# Patient Record
Sex: Male | Born: 1940 | Race: White | Hispanic: No | Marital: Married | Smoking: Former smoker
Health system: Southern US, Community
[De-identification: ages and names within clinical notes are randomized; demographics above are authoritative.]

## PROBLEM LIST (undated history)

## (undated) DIAGNOSIS — R351 Nocturia: Secondary | ICD-10-CM

## (undated) DIAGNOSIS — G8929 Other chronic pain: Secondary | ICD-10-CM

## (undated) DIAGNOSIS — G4733 Obstructive sleep apnea (adult) (pediatric): Secondary | ICD-10-CM

## (undated) DIAGNOSIS — M549 Dorsalgia, unspecified: Secondary | ICD-10-CM

## (undated) DIAGNOSIS — M1990 Unspecified osteoarthritis, unspecified site: Secondary | ICD-10-CM

## (undated) DIAGNOSIS — G4730 Sleep apnea, unspecified: Secondary | ICD-10-CM

## (undated) DIAGNOSIS — Z87898 Personal history of other specified conditions: Secondary | ICD-10-CM

## (undated) DIAGNOSIS — I2510 Atherosclerotic heart disease of native coronary artery without angina pectoris: Secondary | ICD-10-CM

## (undated) DIAGNOSIS — E7800 Pure hypercholesterolemia, unspecified: Secondary | ICD-10-CM

## (undated) DIAGNOSIS — Z8719 Personal history of other diseases of the digestive system: Secondary | ICD-10-CM

## (undated) DIAGNOSIS — H9190 Unspecified hearing loss, unspecified ear: Secondary | ICD-10-CM

## (undated) DIAGNOSIS — Z87440 Personal history of urinary (tract) infections: Secondary | ICD-10-CM

## (undated) DIAGNOSIS — Z8711 Personal history of peptic ulcer disease: Secondary | ICD-10-CM

## (undated) DIAGNOSIS — I499 Cardiac arrhythmia, unspecified: Secondary | ICD-10-CM

## (undated) HISTORY — PX: JOINT REPLACEMENT: SHX530

## (undated) HISTORY — PX: TONSILLECTOMY: SUR1361

## (undated) HISTORY — PX: CARDIAC CATHETERIZATION: SHX172

## (undated) HISTORY — PX: BACK SURGERY: SHX140

## (undated) HISTORY — PX: OTHER SURGICAL HISTORY: SHX169

## (undated) HISTORY — PX: ESOPHAGOGASTRODUODENOSCOPY: SHX1529

## (undated) HISTORY — DX: Atherosclerotic heart disease of native coronary artery without angina pectoris: I25.10

---

## 2000-09-16 ENCOUNTER — Encounter: Admission: RE | Admit: 2000-09-16 | Discharge: 2000-09-16 | Payer: Self-pay | Admitting: Orthopedic Surgery

## 2000-09-16 ENCOUNTER — Encounter: Payer: Self-pay | Admitting: Orthopedic Surgery

## 2004-09-02 ENCOUNTER — Encounter: Admission: RE | Admit: 2004-09-02 | Discharge: 2004-09-02 | Payer: Self-pay | Admitting: Internal Medicine

## 2004-10-06 ENCOUNTER — Encounter: Admission: RE | Admit: 2004-10-06 | Discharge: 2004-10-06 | Payer: Self-pay | Admitting: Internal Medicine

## 2004-10-23 ENCOUNTER — Encounter: Admission: RE | Admit: 2004-10-23 | Discharge: 2004-10-23 | Payer: Self-pay | Admitting: Internal Medicine

## 2004-11-10 ENCOUNTER — Encounter: Admission: RE | Admit: 2004-11-10 | Discharge: 2004-11-10 | Payer: Self-pay | Admitting: Internal Medicine

## 2004-12-01 ENCOUNTER — Encounter: Admission: RE | Admit: 2004-12-01 | Discharge: 2004-12-01 | Payer: Self-pay | Admitting: Internal Medicine

## 2005-01-06 ENCOUNTER — Encounter: Admission: RE | Admit: 2005-01-06 | Discharge: 2005-03-11 | Payer: Self-pay | Admitting: Orthopedic Surgery

## 2005-03-09 ENCOUNTER — Ambulatory Visit (HOSPITAL_COMMUNITY): Admission: RE | Admit: 2005-03-09 | Discharge: 2005-03-09 | Payer: Self-pay | Admitting: Internal Medicine

## 2005-07-27 ENCOUNTER — Encounter: Admission: RE | Admit: 2005-07-27 | Discharge: 2005-07-27 | Payer: Self-pay | Admitting: Internal Medicine

## 2005-08-12 ENCOUNTER — Encounter: Admission: RE | Admit: 2005-08-12 | Discharge: 2005-08-12 | Payer: Self-pay | Admitting: Internal Medicine

## 2005-11-20 ENCOUNTER — Encounter: Admission: RE | Admit: 2005-11-20 | Discharge: 2005-11-20 | Payer: Self-pay | Admitting: Internal Medicine

## 2006-01-26 ENCOUNTER — Encounter: Admission: RE | Admit: 2006-01-26 | Discharge: 2006-01-26 | Payer: Self-pay | Admitting: Internal Medicine

## 2006-04-02 ENCOUNTER — Encounter: Admission: RE | Admit: 2006-04-02 | Discharge: 2006-04-02 | Payer: Self-pay | Admitting: Internal Medicine

## 2006-06-15 ENCOUNTER — Encounter: Admission: RE | Admit: 2006-06-15 | Discharge: 2006-06-15 | Payer: Self-pay | Admitting: Internal Medicine

## 2006-07-02 ENCOUNTER — Encounter: Admission: RE | Admit: 2006-07-02 | Discharge: 2006-07-02 | Payer: Self-pay | Admitting: Internal Medicine

## 2006-10-07 ENCOUNTER — Encounter: Admission: RE | Admit: 2006-10-07 | Discharge: 2006-10-07 | Payer: Self-pay | Admitting: Internal Medicine

## 2006-12-22 ENCOUNTER — Encounter: Admission: RE | Admit: 2006-12-22 | Discharge: 2006-12-22 | Payer: Self-pay | Admitting: Internal Medicine

## 2007-01-27 ENCOUNTER — Encounter: Admission: RE | Admit: 2007-01-27 | Discharge: 2007-01-27 | Payer: Self-pay | Admitting: Internal Medicine

## 2007-02-10 ENCOUNTER — Ambulatory Visit: Payer: Self-pay | Admitting: Cardiology

## 2007-04-18 ENCOUNTER — Encounter: Admission: RE | Admit: 2007-04-18 | Discharge: 2007-05-04 | Payer: Self-pay | Admitting: Orthopedic Surgery

## 2007-05-05 ENCOUNTER — Encounter: Admission: RE | Admit: 2007-05-05 | Discharge: 2007-06-17 | Payer: Self-pay | Admitting: Orthopedic Surgery

## 2007-05-18 ENCOUNTER — Encounter: Admission: RE | Admit: 2007-05-18 | Discharge: 2007-05-18 | Payer: Self-pay | Admitting: Internal Medicine

## 2007-07-26 ENCOUNTER — Encounter: Admission: RE | Admit: 2007-07-26 | Discharge: 2007-07-26 | Payer: Self-pay | Admitting: Internal Medicine

## 2007-09-10 ENCOUNTER — Encounter: Admission: RE | Admit: 2007-09-10 | Discharge: 2007-09-10 | Payer: Self-pay | Admitting: Neurological Surgery

## 2007-09-29 ENCOUNTER — Encounter: Admission: RE | Admit: 2007-09-29 | Discharge: 2007-09-29 | Payer: Self-pay | Admitting: Neurological Surgery

## 2007-11-03 ENCOUNTER — Ambulatory Visit (HOSPITAL_COMMUNITY): Admission: RE | Admit: 2007-11-03 | Discharge: 2007-11-03 | Payer: Self-pay | Admitting: Neurological Surgery

## 2008-01-27 ENCOUNTER — Encounter: Admission: RE | Admit: 2008-01-27 | Discharge: 2008-01-27 | Payer: Self-pay | Admitting: Neurological Surgery

## 2008-07-02 ENCOUNTER — Encounter: Admission: RE | Admit: 2008-07-02 | Discharge: 2008-07-02 | Payer: Self-pay | Admitting: Orthopedic Surgery

## 2009-04-12 DIAGNOSIS — E785 Hyperlipidemia, unspecified: Secondary | ICD-10-CM | POA: Diagnosis present

## 2009-04-12 DIAGNOSIS — K219 Gastro-esophageal reflux disease without esophagitis: Secondary | ICD-10-CM | POA: Diagnosis present

## 2009-05-15 ENCOUNTER — Encounter: Admission: RE | Admit: 2009-05-15 | Discharge: 2009-05-15 | Payer: Self-pay | Admitting: Internal Medicine

## 2009-12-30 ENCOUNTER — Encounter: Admission: RE | Admit: 2009-12-30 | Discharge: 2010-03-30 | Payer: Self-pay | Admitting: Rheumatology

## 2010-03-25 ENCOUNTER — Inpatient Hospital Stay (HOSPITAL_COMMUNITY): Admission: EM | Admit: 2010-03-25 | Discharge: 2010-03-26 | Payer: Self-pay | Admitting: Emergency Medicine

## 2010-03-26 ENCOUNTER — Encounter (INDEPENDENT_AMBULATORY_CARE_PROVIDER_SITE_OTHER): Payer: Self-pay | Admitting: Cardiology

## 2010-05-01 ENCOUNTER — Ambulatory Visit (HOSPITAL_COMMUNITY)
Admission: RE | Admit: 2010-05-01 | Discharge: 2010-05-01 | Payer: Self-pay | Source: Home / Self Care | Attending: Cardiology | Admitting: Cardiology

## 2010-07-14 LAB — CBC
HCT: 42 % (ref 39.0–52.0)
MCH: 33.3 pg (ref 26.0–34.0)
MCV: 97 fL (ref 78.0–100.0)
Platelets: 261 10*3/uL (ref 150–400)
RBC: 4.33 MIL/uL (ref 4.22–5.81)
RDW: 14 % (ref 11.5–15.5)

## 2010-07-14 LAB — BASIC METABOLIC PANEL
BUN: 20 mg/dL (ref 6–23)
Calcium: 9 mg/dL (ref 8.4–10.5)
Creatinine, Ser: 1.19 mg/dL (ref 0.4–1.5)

## 2010-07-15 LAB — COMPREHENSIVE METABOLIC PANEL
ALT: 32 U/L (ref 0–53)
AST: 20 U/L (ref 0–37)
CO2: 22 mEq/L (ref 19–32)
Chloride: 107 mEq/L (ref 96–112)
GFR calc Af Amer: 60 mL/min — ABNORMAL LOW (ref >60)
GFR calc non Af Amer: 49 mL/min — ABNORMAL LOW (ref >60)
Sodium: 138 mEq/L (ref 135–145)
Total Bilirubin: 0.7 mg/dL (ref 0.3–1.2)

## 2010-07-15 LAB — CBC
HCT: 42.3 % (ref 39.0–52.0)
Hemoglobin: 15.9 g/dL (ref 13.0–17.0)
MCH: 34.4 pg — ABNORMAL HIGH (ref 26.0–34.0)
MCHC: 35.3 g/dL (ref 30.0–36.0)
MCV: 97.4 fL (ref 78.0–100.0)
MCV: 98.6 fL (ref 78.0–100.0)
Platelets: 300 10*3/uL (ref 150–400)
RBC: 4.62 MIL/uL (ref 4.22–5.81)
RDW: 13.9 % (ref 11.5–15.5)
WBC: 9.6 10*3/uL (ref 4.0–10.5)

## 2010-07-15 LAB — DIFFERENTIAL
Basophils Absolute: 0 10*3/uL (ref 0.0–0.1)
Basophils Relative: 0 % (ref 0–1)
Eosinophils Absolute: 0.2 10*3/uL (ref 0.0–0.7)
Eosinophils Relative: 2 % (ref 0–5)

## 2010-07-15 LAB — LIPID PANEL
HDL: 47 mg/dL (ref >39)
Triglycerides: 127 mg/dL (ref <150)
VLDL: 25 mg/dL (ref 0–40)

## 2010-07-15 LAB — CARDIAC PANEL(CRET KIN+CKTOT+MB+TROPI)
Relative Index: 2.4 (ref 0.0–2.5)
Troponin I: 0.06 ng/mL (ref 0.00–0.06)

## 2010-07-15 LAB — TROPONIN I: Troponin I: 0.01 ng/mL (ref 0.00–0.06)

## 2010-08-11 ENCOUNTER — Ambulatory Visit: Payer: Self-pay | Admitting: Physical Therapy

## 2010-08-14 ENCOUNTER — Ambulatory Visit: Payer: Medicare Other | Attending: Pain Medicine | Admitting: Physical Therapy

## 2010-08-14 DIAGNOSIS — R5381 Other malaise: Secondary | ICD-10-CM | POA: Insufficient documentation

## 2010-08-14 DIAGNOSIS — M545 Low back pain, unspecified: Secondary | ICD-10-CM | POA: Insufficient documentation

## 2010-08-14 DIAGNOSIS — IMO0001 Reserved for inherently not codable concepts without codable children: Secondary | ICD-10-CM | POA: Insufficient documentation

## 2010-08-15 ENCOUNTER — Ambulatory Visit: Payer: Medicare Other | Admitting: Physical Therapy

## 2010-08-19 ENCOUNTER — Ambulatory Visit: Payer: Medicare Other | Admitting: Physical Therapy

## 2010-08-21 ENCOUNTER — Ambulatory Visit: Payer: Medicare Other | Admitting: Physical Therapy

## 2010-09-02 ENCOUNTER — Encounter (HOSPITAL_COMMUNITY)
Admission: RE | Admit: 2010-09-02 | Discharge: 2010-09-02 | Disposition: A | Discharge status: Home / Self Care | Payer: Medicare Other | Source: Ambulatory Visit | Attending: Orthopedic Surgery | Admitting: Orthopedic Surgery

## 2010-09-02 ENCOUNTER — Other Ambulatory Visit (HOSPITAL_COMMUNITY): Payer: Self-pay | Admitting: Orthopedic Surgery

## 2010-09-02 ENCOUNTER — Ambulatory Visit (HOSPITAL_COMMUNITY)
Admission: RE | Admit: 2010-09-02 | Discharge: 2010-09-02 | Disposition: A | Discharge status: Home / Self Care | Payer: Medicare Other | Source: Ambulatory Visit | Attending: Orthopedic Surgery | Admitting: Orthopedic Surgery

## 2010-09-02 DIAGNOSIS — I10 Essential (primary) hypertension: Secondary | ICD-10-CM | POA: Insufficient documentation

## 2010-09-02 DIAGNOSIS — M19012 Primary osteoarthritis, left shoulder: Secondary | ICD-10-CM

## 2010-09-02 DIAGNOSIS — M19019 Primary osteoarthritis, unspecified shoulder: Secondary | ICD-10-CM | POA: Insufficient documentation

## 2010-09-02 DIAGNOSIS — Z01810 Encounter for preprocedural cardiovascular examination: Secondary | ICD-10-CM | POA: Insufficient documentation

## 2010-09-02 DIAGNOSIS — Z01812 Encounter for preprocedural laboratory examination: Secondary | ICD-10-CM | POA: Insufficient documentation

## 2010-09-02 DIAGNOSIS — Z01811 Encounter for preprocedural respiratory examination: Secondary | ICD-10-CM | POA: Insufficient documentation

## 2010-09-02 LAB — URINALYSIS, ROUTINE W REFLEX MICROSCOPIC
Bilirubin Urine: NEGATIVE
Glucose, UA: NEGATIVE mg/dL
Hgb urine dipstick: NEGATIVE
Ketones, ur: NEGATIVE mg/dL
Nitrite: NEGATIVE
pH: 6 (ref 5.0–8.0)

## 2010-09-02 LAB — CBC
MCH: 32.7 pg (ref 26.0–34.0)
MCV: 93.7 fL (ref 78.0–100.0)
Platelets: 271 10*3/uL (ref 150–400)
RBC: 4.31 MIL/uL (ref 4.22–5.81)

## 2010-09-02 LAB — COMPREHENSIVE METABOLIC PANEL
ALT: 49 U/L (ref 0–53)
AST: 34 U/L (ref 0–37)
Albumin: 3.8 g/dL (ref 3.5–5.2)
Alkaline Phosphatase: 56 U/L (ref 39–117)
Chloride: 102 mEq/L (ref 96–112)
GFR calc Af Amer: 60 mL/min — ABNORMAL LOW (ref >60)
Potassium: 5 mEq/L (ref 3.5–5.1)
Sodium: 139 mEq/L (ref 135–145)
Total Bilirubin: 0.4 mg/dL (ref 0.3–1.2)

## 2010-09-02 LAB — ABO/RH: ABO/RH(D): B POS

## 2010-09-02 LAB — TYPE AND SCREEN

## 2010-09-02 LAB — APTT: aPTT: 37 seconds (ref 24–37)

## 2010-09-04 ENCOUNTER — Inpatient Hospital Stay (HOSPITAL_COMMUNITY)
Admission: RE | Admit: 2010-09-04 | Discharge: 2010-09-05 | DRG: 484 | Disposition: A | Discharge status: Home / Self Care | Payer: Medicare Other | Source: Ambulatory Visit | Attending: Orthopedic Surgery | Admitting: Orthopedic Surgery

## 2010-09-04 DIAGNOSIS — I10 Essential (primary) hypertension: Secondary | ICD-10-CM | POA: Diagnosis present

## 2010-09-04 DIAGNOSIS — G4733 Obstructive sleep apnea (adult) (pediatric): Secondary | ICD-10-CM | POA: Diagnosis present

## 2010-09-04 DIAGNOSIS — E669 Obesity, unspecified: Secondary | ICD-10-CM | POA: Diagnosis present

## 2010-09-04 DIAGNOSIS — M19019 Primary osteoarthritis, unspecified shoulder: Principal | ICD-10-CM | POA: Diagnosis present

## 2010-09-04 DIAGNOSIS — I4891 Unspecified atrial fibrillation: Secondary | ICD-10-CM | POA: Diagnosis present

## 2010-09-16 NOTE — Op Note (Signed)
NAMEELHADJI, PECORE NO.:  [id number]   MEDICAL RECORD NO.:  [id number]          PATIENT TYPE:  INP   LOCATION:  3536                         FACILITY:  [facility]   PHYSICIAN:  Stefani Dama, M.D.  DATE OF BIRTH:  [birth year]   DATE OF PROCEDURE:  11/03/2007  DATE OF DISCHARGE:  11/03/2007                               OPERATIVE REPORT   PREOPERATIVE DIAGNOSES:  1. Lumbar spondylosis.  2. 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   POSTOPERATIVE DIAGNOSIS:  1. Lumbar spondylosis.  2. 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   PROCEDURE:  Placement of X-Stop interspinous distraction at L4-L5.   SURGEON:  Stefani Dama, MD   FIRST ASSISTANT:  Aura Fey. Dennison Bulla, [place]   INDICATIONS:  Mcgregor Tinnon is a 70 year old individual that had  evidence of subarticular stenosis at L4-L5 causing neurogenic  claudication syndrome with an L5 type distribution.  The patient has  been tried on conservative measures including some epidural steroid  injections which have had limited benefit used this advice regarding  consideration of X-Stop placement and is now taken to the operating room  for this procedure.  His MRI demonstrates that he has some articular  stenosis with grade 1 anterolisthesis at L4-L5.   PROCEDURE:  The patient was brought to the operating room supine on the  stretcher after smooth induction of general endotracheal anesthesia.  He  was turned prone.  The back was prepped with alcohol and DuraPrep and  draped in sterile fashion.  A midline incision was created and carried  down to the lumbodorsal fascia in the midline.  The dissection was then  taken down in the paraspinous musculature in a subperiosteal fashion  after localizing L4-L5 positively on fluoroscopic imaging.  A self-  retaining retractor was then placed deep in the wound and the  interlaminar space at L4-L5 was identified by palpation and radiographic  confirmation by placing a small awl in the interspinous ligament.  The  base of the interspinous ligament was then cleared by clearing the fat  on the medial aspects of the facets on either side to widen the opening  and allow placement of distraction tool from the right side the left  side in the interspinous space at L4-L5.  Then under fluoroscopic  imaging, distraction was applied.  The total of initially 12 mm under  tight distraction then with some ligamentum taxis, 14 mm was able to be  instilled in the interspinous ligament.  After a few minutes of  distraction, the ligament was relaxed and redistraction was eased to the  12-mm size.  At the side, we placed a 12-mm X-Stop in this interval.  A  12-mm X-Stop was then inserted from right to left and the locking wing  was applied on the opposite side.  Final radiographs were obtained in AP  and lateral projection identifying good placement of the X-Stop low in  the interspinous space.  Hemostasis in the soft tissues was then  obtained.  The lumbodorsal fascia was closed with #1 Vicryl interrupted  fashion,  2-0 Vicryl using  subcutaneous tissues, 3-0 Vicryl subcuticularly.  Prior to closing the  upper layers, 20 mL of Marcaine 0.5% was injected into the paraspinous  musculature.  The patient was also given 15 mg of Toradol intravenously.  He is returned to recovery room in stable condition.      Stefani Dama, M.D.  Electronically Signed     HJE/MEDQ  D:  11/03/2007  T:  11/03/2007  Job:  [id number]

## 2010-09-17 NOTE — Op Note (Signed)
Carlos Hanson, [name]:  [id number]  MEDICAL RECORD NO.:  [id number]           PATIENT TYPE:  I  LOCATION:  5018                         FACILITY:  [facility]  PHYSICIAN:  Vania Rea. Deette Revak, M.D.  DATE OF BIRTH:  1941/02/29  DATE OF PROCEDURE:  09/04/2010 DATE OF DISCHARGE:                              OPERATIVE REPORT   PREOPERATIVE DIAGNOSIS:  End-stage left shoulder osteoarthrosis.  POSTOPERATIVE DIAGNOSIS:  End-stage left shoulder osteoarthrosis.  PROCEDURE:  Left total shoulder arthroplasty utilizing a Press-Fit size 16, DePuy global stem with a 52 x 18 eccentric head and a 48 cemented pegged glenoid.  SURGEON:  Vania Rea. Scherrie Seneca, MD  ASSISTANT:  Lucita Lora. Shuford, PA-C  ANESTHESIA:  General endotracheal as well as an interscalene block.  BLOOD LOSS:  300 mL.  DRAINS:  None.  HISTORY:  Mr. Heady is a 70 year old gentleman who has had chronic and progressively increasing left shoulder pain, weakness and functional limitations related to end-stage osteoarthrosis of the left shoulder. He is brought to the operating at this time for planned left total shoulder arthroplasty.  Preoperatively, I had counseled Mr. Crisco on treatment options as well as risks versus benefits thereof.  Possible surgical complications were reviewed including potential for bleeding, infection, neurovascular injury, persistent pain, loss of motion, anesthetic complication, failure of the implant, possible need for additional surgery.  He understands and accepts and agrees with our planned procedure.  PROCEDURE IN DETAIL:  After undergoing routine preop evaluation, the patient received prophylactic antibiotics and an interscalene block was established in holding area by the Anesthesia Department.  Placed supine on the operating table and underwent smooth induction of a general endotracheal anesthesia.  Placed into the beach-chair position and appropriately padded and protected.   Left shoulder girdle region was then sterilely prepped and draped in standard fashion.  Time-out was called.  An anterior approach of the left shoulder was made through a deltopectoral incision approximately 15 cm in length.  Skin flaps were elevated and dissection carried deeply identifying the deltopectoral interval, which was developed bluntly with the cephalic vein retracted laterally with the deltoid.  The upper centimeter of the pec major was tenotomized and self-retaining retractors were placed.  We released adhesions in the subacromial/subdeltoid bursa and the conjoined tendon was then retracted medially.  The biceps tendon was then identified, unroofed and tenotomized for later tenodesis.  We then performed a tenotomy of the subscapularis leaving a distal cuff approximately 2 cm. We then split the rotator interval at the base of the coracoid.  The free margin of the subscap was intact with a series of #2 FiberWires for later repair.  The humeral head was then dislocated and utilizing the extramedullary guide, we outlined our postosteotomy and then informed this cut with an oscillating saw.  It was then measured.  We then performed hand reaming to gain access to the humoral medullary canal, this was reamed up to size 16.  We then performed a cookie cutter broaching maintaining approximately 30 degrees of retroversion and then sequential broaching up to size 16 with excellent fit.  The broach  was then removed.  Metal cap was placed over the proximal surface of the humerus and then we performed exposure of the glenoid with Fukuda retractor, pitchfork and snake tongue circumferentially.  We performed a circumferential labral resection and capsular release.  We could completely visualized the entire glenoid.  The 48 glenoid showed excellent coverage and the central guidepin was then placed.  We performed reaming with 48 to subchondral bone and then removed some peripheral osteophytes  with a rongeur.  The central drill hole was then placed followed by the peripheral peg holes using the proper guide. Trial showed excellent fit coverage.  At this point, the glenoid was irrigated.  Cement was mixed in the appropriate consistency, cement was introduced into the peripheral peg holes and the 48 glenoid was then impacted into position with excellent fit and purchase.  After the cement had hardened, we returned our attention to the humerus where we irrigated the canal.  The size 16 stem was introduced.  We packed abundant cancellous bone graft harvested from the head about the proximal aspect of the humeral stem and then performed terminal impaction obtaining excellent fit.  We then performed trial reduction of the 52 x 18 eccentric head showed the best coverage and good soft tissue balance with 50% translation of the humeral head across the glenoid. The final 52 x 18 eccentric head was then properly positioned, impacted with good stability and final reduction was performed.  We then performed repair of the subscapularis back to its lateral stump with #2 FiberWire, then oversewed this with additional #2 FiberWire obtaining excellent strength of repair.  I should mention that we had completely mobilized the subscapularis with good excursion prior to the repair.  We then closed the rotator interval with figure-of-eight #2 FiberWire sutures.  The biceps tendon was then tenodesed at the midpoint of the bicipital groove with #2 FiberWire.  Wound was then irrigated. Hemostasis was obtained.  The deltopectoral interval was then reapproximated with 0 Vicryl, 2-0 Vicryl was used for the subcu and intracuticular 3-0 Monocryl for the skin followed by Steri-Strips.  Dry dressing was applied.  Left arm was then placed in sling immobilizer. The patient was awakened, extubated, and taken to the recovery room in stable condition.     Vania Rea. Peighton Mehra, M.D.     KMS/MEDQ  D:  09/04/2010   T:  09/04/2010  Job:  [id number]  Electronically Signed by Francena Hanly M.D. on 09/17/2010 02:31:27 PM

## 2010-09-22 ENCOUNTER — Ambulatory Visit: Payer: Medicare Other | Attending: Orthopedic Surgery | Admitting: Physical Therapy

## 2010-09-22 DIAGNOSIS — M25619 Stiffness of unspecified shoulder, not elsewhere classified: Secondary | ICD-10-CM | POA: Insufficient documentation

## 2010-09-22 DIAGNOSIS — M25519 Pain in unspecified shoulder: Secondary | ICD-10-CM | POA: Insufficient documentation

## 2010-09-22 DIAGNOSIS — IMO0001 Reserved for inherently not codable concepts without codable children: Secondary | ICD-10-CM | POA: Insufficient documentation

## 2010-09-25 ENCOUNTER — Ambulatory Visit: Payer: Medicare Other | Admitting: *Deleted

## 2010-10-01 ENCOUNTER — Ambulatory Visit: Payer: Medicare Other | Admitting: Physical Therapy

## 2010-10-03 ENCOUNTER — Ambulatory Visit: Payer: Medicare Other | Attending: Orthopedic Surgery | Admitting: Physical Therapy

## 2010-10-03 DIAGNOSIS — IMO0001 Reserved for inherently not codable concepts without codable children: Secondary | ICD-10-CM | POA: Insufficient documentation

## 2010-10-03 DIAGNOSIS — M25619 Stiffness of unspecified shoulder, not elsewhere classified: Secondary | ICD-10-CM | POA: Insufficient documentation

## 2010-10-03 DIAGNOSIS — M25519 Pain in unspecified shoulder: Secondary | ICD-10-CM | POA: Insufficient documentation

## 2010-10-06 ENCOUNTER — Ambulatory Visit: Payer: Medicare Other | Admitting: Physical Therapy

## 2010-10-08 ENCOUNTER — Ambulatory Visit: Payer: Medicare Other | Admitting: Physical Therapy

## 2010-10-10 ENCOUNTER — Ambulatory Visit: Payer: Medicare Other | Admitting: Physical Therapy

## 2010-10-13 ENCOUNTER — Ambulatory Visit: Payer: Medicare Other | Admitting: Physical Therapy

## 2010-10-15 ENCOUNTER — Ambulatory Visit: Payer: Medicare Other | Admitting: Physical Therapy

## 2010-10-17 ENCOUNTER — Ambulatory Visit: Payer: Medicare Other | Admitting: Physical Therapy

## 2010-10-20 ENCOUNTER — Ambulatory Visit: Payer: Medicare Other | Admitting: Physical Therapy

## 2010-10-22 ENCOUNTER — Ambulatory Visit: Payer: Medicare Other | Admitting: Physical Therapy

## 2010-10-24 ENCOUNTER — Ambulatory Visit: Payer: Medicare Other | Admitting: Physical Therapy

## 2010-10-27 ENCOUNTER — Ambulatory Visit: Payer: Medicare Other | Admitting: Physical Therapy

## 2010-10-29 ENCOUNTER — Ambulatory Visit: Payer: Medicare Other | Admitting: Physical Therapy

## 2010-10-31 ENCOUNTER — Ambulatory Visit: Payer: Medicare Other | Admitting: *Deleted

## 2010-11-03 ENCOUNTER — Ambulatory Visit: Payer: Medicare Other | Attending: Orthopedic Surgery

## 2010-11-03 DIAGNOSIS — M25519 Pain in unspecified shoulder: Secondary | ICD-10-CM | POA: Insufficient documentation

## 2010-11-03 DIAGNOSIS — IMO0001 Reserved for inherently not codable concepts without codable children: Secondary | ICD-10-CM | POA: Insufficient documentation

## 2010-11-03 DIAGNOSIS — M25619 Stiffness of unspecified shoulder, not elsewhere classified: Secondary | ICD-10-CM | POA: Insufficient documentation

## 2010-11-07 ENCOUNTER — Ambulatory Visit: Payer: Medicare Other | Admitting: Physical Therapy

## 2010-11-10 ENCOUNTER — Ambulatory Visit: Payer: Medicare Other | Admitting: Physical Therapy

## 2010-11-12 ENCOUNTER — Ambulatory Visit: Payer: Medicare Other | Admitting: Physical Therapy

## 2010-11-14 ENCOUNTER — Ambulatory Visit: Payer: Medicare Other | Admitting: *Deleted

## 2010-11-17 ENCOUNTER — Ambulatory Visit: Payer: Medicare Other | Admitting: Physical Therapy

## 2010-11-18 ENCOUNTER — Ambulatory Visit: Payer: Medicare Other | Admitting: *Deleted

## 2010-11-26 ENCOUNTER — Ambulatory Visit: Payer: Medicare Other | Admitting: Physical Therapy

## 2010-11-28 ENCOUNTER — Ambulatory Visit: Payer: Medicare Other | Admitting: Physical Therapy

## 2010-12-01 ENCOUNTER — Ambulatory Visit: Payer: Medicare Other | Admitting: Physical Therapy

## 2010-12-03 ENCOUNTER — Ambulatory Visit: Payer: Medicare Other | Attending: Orthopedic Surgery | Admitting: Physical Therapy

## 2010-12-03 DIAGNOSIS — M25619 Stiffness of unspecified shoulder, not elsewhere classified: Secondary | ICD-10-CM | POA: Insufficient documentation

## 2010-12-03 DIAGNOSIS — M25519 Pain in unspecified shoulder: Secondary | ICD-10-CM | POA: Insufficient documentation

## 2010-12-03 DIAGNOSIS — IMO0001 Reserved for inherently not codable concepts without codable children: Secondary | ICD-10-CM | POA: Insufficient documentation

## 2010-12-08 ENCOUNTER — Ambulatory Visit: Payer: Medicare Other | Admitting: Physical Therapy

## 2010-12-11 NOTE — Discharge Summary (Signed)
  [name], [name]:  [id number]  MEDICAL RECORD NO.:  [id number]  LOCATION:                                 FACILITY:  PHYSICIAN:  Vania Rea. Milagros Middendorf, M.D.  DATE OF BIRTH:  01/22/1941  DATE OF ADMISSION:  09/06/2010 DATE OF DISCHARGE:  09/07/2010                              DISCHARGE SUMMARY   ADMISSION DIAGNOSES: 1. Left shoulder end-stage osteoarthrosis. 2. History of atrial fibrillation.  DISCHARGE DIAGNOSES: 1. Left shoulder end-stage osteoarthrosis. 2. History of atrial fibrillation. 3. Status post left total shoulder arthroplasty.  OPERATIONS:  Left total shoulder arthroplasty.  SURGEON:  Vania Rea. Harla Mensch, MD  ASSISTANT:  Lucita Lora. Shuford, PA-C  General anesthetic with interscalene block.  BRIEF HISTORY:  Carlos Hanson is a 70 year old gentleman who has failed outpatient conservative measures in regards to his left shoulder end- stage osteoarthrosis, at which point in time now total shoulder arthroplasty was indicated for relief of this pain.  Risks and benefits were discussed with the patient and received cardiac clearance and was admitted above-named procedure.  HOSPITAL COURSE:  The patient was admitted, underwent above-named procedure, and tolerates well.  All appropriate IV antibiotics were utilized.  Postoperatively, the patient was placed on appropriate IV antibiotics and weaned to p.o.'s.  He did extremely well and was stable for discharge to home on the following day following occupational therapy.  He is neurovascularly intact.  He was medically was felt stable for discharge.  Condition on discharge is stable, improved.  DISCHARGE MEDS AND PLANS:  The patient will be discharged to home.  He is on the following medications outpatient per med reconciliation, Percocet is added for discharge pain relief.  Follow up in our office as an outpatient.  Call for additional times, resume other home meds and home diet.     Tracy A.  Shuford, P.A.-C.   ______________________________ Vania Rea. Evaleen Sant, M.D.    TAS/MEDQ  D:  10/22/2010  T:  10/22/2010  Job:  [id number]  Electronically Signed by Ralene Bathe P.A.-C. on 11/21/2010 02:03:14 PM Electronically Signed by Francena Hanly M.D. on 12/11/2010 01:31:03 PM

## 2011-01-29 LAB — CBC
HCT: 45.3
MCV: 94
RBC: 4.82
WBC: 6.3

## 2011-01-29 LAB — BASIC METABOLIC PANEL
Chloride: 103
GFR calc Af Amer: >60
GFR calc non Af Amer: >60
Potassium: 4.7

## 2011-07-01 ENCOUNTER — Telehealth (INDEPENDENT_AMBULATORY_CARE_PROVIDER_SITE_OTHER): Payer: Self-pay | Admitting: *Deleted

## 2011-07-01 ENCOUNTER — Other Ambulatory Visit (INDEPENDENT_AMBULATORY_CARE_PROVIDER_SITE_OTHER): Payer: Self-pay | Admitting: *Deleted

## 2011-07-01 DIAGNOSIS — Z800 Family history of malignant neoplasm of digestive organs: Secondary | ICD-10-CM

## 2011-07-01 DIAGNOSIS — Z86010 Personal history of colonic polyps: Secondary | ICD-10-CM

## 2011-07-01 MED ORDER — PEG-KCL-NACL-NASULF-NA ASC-C 100 G PO SOLR: 1.0000 | Freq: Once | ORAL | Status: DC

## 2011-07-01 NOTE — Telephone Encounter (Signed)
Patient needs movi prep 

## 2011-08-10 ENCOUNTER — Telehealth (INDEPENDENT_AMBULATORY_CARE_PROVIDER_SITE_OTHER): Payer: Self-pay | Admitting: *Deleted

## 2011-08-10 NOTE — Telephone Encounter (Signed)
Requesting MD: Jarome Matin  PCP:       Name & DOB: Benedetto Goad  02/14/1941    Procedure: tcs  Reason/Indication:  Hx polyp,s fam hx colon ca  Has patient had this procedure before?  yes  If so, when, by whom and where?  2007 (in paper chart)  Is there a family history of colon cancer?  yes  Who?  What age when diagnosed?  mother  Is patient diabetic?   no      Does patient have prosthetic heart valve?  no  Do you have a pacemaker?  no  Has patient had joint replacement within last 12 months?  no  Is patient on Coumadin, Plavix and/or Aspirin? yes  Medications: pradaxa 150 mg bid (dr Jacinto Halim), oxycoton 20 mg daily, allopurinol 300 mg daily, triam/HCTZ 37.5/25 mg 1/2 tab daily, losartan 100 mg daily, pantoprazole 40 mg bid, furosemide 20 mg prn, diltiazem 180 mg daily, crestor 10 mg daily, amiodarone 200 mg daily   Allergies: naprosyn  Pharm[facility]: [facility]  Medication Adjustment: pradaxa 2 days (ok per Dr Jacinto Halim)  Procedure date & time: 08/27/11 at 930

## 2011-08-12 NOTE — Telephone Encounter (Signed)
agree

## 2011-08-26 MED ORDER — SODIUM CHLORIDE 0.45 % IV SOLN
Freq: Once | INTRAVENOUS | Status: AC
  Administered 2011-08-27: 09:00:00 via INTRAVENOUS

## 2011-08-27 ENCOUNTER — Encounter (HOSPITAL_COMMUNITY): Payer: Self-pay | Admitting: *Deleted

## 2011-08-27 ENCOUNTER — Ambulatory Visit (HOSPITAL_COMMUNITY)
Admission: RE | Admit: 2011-08-27 | Discharge: 2011-08-27 | Disposition: A | Discharge status: Home / Self Care | Payer: Medicare Other | Source: Ambulatory Visit | Attending: Internal Medicine | Admitting: Internal Medicine

## 2011-08-27 ENCOUNTER — Encounter (HOSPITAL_COMMUNITY)
Admission: RE | Disposition: A | Discharge status: Home / Self Care | Payer: Self-pay | Source: Ambulatory Visit | Attending: Internal Medicine

## 2011-08-27 DIAGNOSIS — G4733 Obstructive sleep apnea (adult) (pediatric): Secondary | ICD-10-CM | POA: Insufficient documentation

## 2011-08-27 DIAGNOSIS — Z800 Family history of malignant neoplasm of digestive organs: Secondary | ICD-10-CM

## 2011-08-27 DIAGNOSIS — K644 Residual hemorrhoidal skin tags: Secondary | ICD-10-CM

## 2011-08-27 DIAGNOSIS — I10 Essential (primary) hypertension: Secondary | ICD-10-CM | POA: Insufficient documentation

## 2011-08-27 DIAGNOSIS — K5730 Diverticulosis of large intestine without perforation or abscess without bleeding: Secondary | ICD-10-CM

## 2011-08-27 DIAGNOSIS — Z860100 Personal history of colon polyps, unspecified: Secondary | ICD-10-CM | POA: Insufficient documentation

## 2011-08-27 DIAGNOSIS — Z09 Encounter for follow-up examination after completed treatment for conditions other than malignant neoplasm: Secondary | ICD-10-CM | POA: Insufficient documentation

## 2011-08-27 DIAGNOSIS — E7800 Pure hypercholesterolemia, unspecified: Secondary | ICD-10-CM | POA: Insufficient documentation

## 2011-08-27 DIAGNOSIS — Z79899 Other long term (current) drug therapy: Secondary | ICD-10-CM | POA: Insufficient documentation

## 2011-08-27 HISTORY — PX: COLONOSCOPY: SHX5424

## 2011-08-27 HISTORY — DX: Pure hypercholesterolemia, unspecified: E78.00

## 2011-08-27 HISTORY — DX: Other chronic pain: G89.29

## 2011-08-27 HISTORY — DX: Personal history of other specified conditions: Z87.898

## 2011-08-27 HISTORY — DX: Cardiac arrhythmia, unspecified: I49.9

## 2011-08-27 HISTORY — DX: Sleep apnea, unspecified: G47.30

## 2011-08-27 HISTORY — DX: Dorsalgia, unspecified: M54.9

## 2011-08-27 SURGERY — COLONOSCOPY
Anesthesia: Moderate Sedation

## 2011-08-27 MED ORDER — MEPERIDINE HCL 50 MG/ML IJ SOLN
INTRAMUSCULAR | Status: DC | PRN
  Administered 2011-08-27 (×4): 25 mg via INTRAVENOUS

## 2011-08-27 MED ORDER — MEPERIDINE HCL 50 MG/ML IJ SOLN
INTRAMUSCULAR | Status: AC
  Filled 2011-08-27: qty 1

## 2011-08-27 MED ORDER — SIMETHICONE 40 MG/0.6ML PO SUSP
ORAL | Status: DC | PRN
  Administered 2011-08-27: 10:00:00

## 2011-08-27 MED ORDER — MIDAZOLAM HCL 5 MG/5ML IJ SOLN
INTRAMUSCULAR | Status: AC
  Filled 2011-08-27: qty 10

## 2011-08-27 MED ORDER — MIDAZOLAM HCL 5 MG/5ML IJ SOLN
INTRAMUSCULAR | Status: AC
  Filled 2011-08-27: qty 5

## 2011-08-27 MED ORDER — MIDAZOLAM HCL 5 MG/5ML IJ SOLN
INTRAMUSCULAR | Status: DC | PRN
  Administered 2011-08-27 (×2): 2 mg via INTRAVENOUS
  Administered 2011-08-27 (×2): 3 mg via INTRAVENOUS
  Administered 2011-08-27: 1 mg via INTRAVENOUS
  Administered 2011-08-27 (×2): 2 mg via INTRAVENOUS

## 2011-08-27 NOTE — H&P (Signed)
Carlos Hanson is an 71 y.o. male.   Chief Complaint: Patient is here for colonoscopy. HPI: Patient is a 71 year-old Caucasian male with history of colonic polyps and is here for surveillance colonoscopy. He denies abdominal pain change in his bowel habits. Family history is negative for colorectal carcinoma as is indicated in triage sheet. Patient's mother had gynecologic cancer and not colon cancer.  Past Medical History  Diagnosis Date  . Hypertension   . Gout   . Dysrhythmia     History of atrial fibrillation  . Hypercholesteremia   . Sleep apnea   . History of ulcer disease   . Chronic back pain     Past Surgical History  Procedure Date  . Esophagogastroduodenoscopy   . Left shoulder replacement   . Tonsillectomy   . Back surgery     Family History  Problem Relation Age of Onset  . Colon cancer Neg Hx    Social History:  reports that he has quit smoking. His smoking use included Cigarettes. He has a 3 pack-year smoking history. He does not have any smokeless tobacco history on file. He reports that he drinks about 7 ounces of alcohol per week. He reports that he does not use illicit drugs.  Allergies:  Allergies  Allergen Reactions  . Naprosyn (Naproxen) Rash    Medications Prior to Admission  Medication Sig Dispense Refill  . allopurinol (ZYLOPRIM) 300 MG tablet Take 300 mg by mouth daily.      Marland Kitchen amiodarone (PACERONE) 200 MG tablet Take 200 mg by mouth daily.      . dabigatran (PRADAXA) 150 MG CAPS Take 150 mg by mouth every 12 (twelve) hours.      Marland Kitchen diltiazem (DILACOR XR) 180 MG 24 hr capsule Take 180 mg by mouth daily.      Marland Kitchen losartan (COZAAR) 100 MG tablet Take 100 mg by mouth daily.      Marland Kitchen oxyCODONE (OXYCONTIN) 20 MG 12 hr tablet Take 20 mg by mouth daily.      . pantoprazole (PROTONIX) 40 MG tablet Take 40 mg by mouth 2 (two) times daily.      . peg 3350 powder (MOVIPREP) 100 G SOLR Take 1 kit (100 g total) by mouth once.  1 kit  0  . rosuvastatin (CRESTOR) 10  MG tablet Take 10 mg by mouth daily.      Marland Kitchen triamterene-hydrochlorothiazide (MAXZIDE-25) 37.5-25 MG per tablet Take 0.5 tablets by mouth daily.      . furosemide (LASIX) 20 MG tablet Take 20 mg by mouth 2 (two) times daily as needed.        No results found for this or any previous visit (from the past 48 hour(s)). No results found.  Review of Systems  Constitutional: Negative for weight loss.  Gastrointestinal: Negative for abdominal pain, diarrhea, constipation, blood in stool and melena.    Blood pressure 151/84, pulse 83, temperature 97.7 F (36.5 C), temperature source Oral, resp. rate 24, height 6' (1.829 m), weight 330 lb (149.687 kg), SpO2 95.00%. Physical Exam  Constitutional: He appears well-developed and well-nourished.       Patient is obese  HENT:  Mouth/Throat: Oropharynx is clear and moist.  Eyes: Conjunctivae are normal. No scleral icterus.  Neck: No thyromegaly present.  Cardiovascular: Normal rate, regular rhythm and normal heart sounds.   No murmur heard. Respiratory: Effort normal and breath sounds normal.  GI: Soft. He exhibits no distension and no mass. There is no tenderness.  Musculoskeletal: He exhibits no edema.  Lymphadenopathy:    He has no cervical adenopathy.  Neurological: He is alert.  Skin: Skin is warm and dry.     Assessment/Plan History of colonic polyps. Surveillance colonoscopy.  Carlos Hanson U 08/27/2011, 9:51 AM

## 2011-08-27 NOTE — Op Note (Signed)
COLONOSCOPY PROCEDURE REPORT  PATIENT:  Carlos Hanson  MR#:  [id number] Birthdate:  Aug 05, 1940, 71 y.o., male Endoscopist:  Dr. Malissa Hippo, MD Referred By:  Dr. Barry Dienes. Jarold Motto, MD Procedure Date: 08/27/2011  Procedure:   Colonoscopy  Indications:  Patient is a 71 year old Caucasian male who is undergoing surveillance colonoscopy. He has history of colonic polyps.  Informed Consent:  The procedure and risks were reviewed with the patient and informed consent was obtained.  Medications:  Demerol 100 mg IV Versed 15 mg IV  Description of procedure:  After a digital rectal exam was performed, that colonoscope was advanced from the anus through the rectum and colon to the area of the cecum, ileocecal valve. In order to see the blunt end of cecum pediatric scope was exchanged with a regular colonoscope. Furthermore mucosa was pulled with forceps and appendiceal orifice seen. Picture  taken for the record. These structures were well-seen and photographed for the record. From the level of the cecum and ileocecal valve, the scope was slowly and cautiously withdrawn. The mucosal surfaces were carefully surveyed utilizing scope tip to flexion to facilitate fold flattening as needed. The scope was pulled down into the rectum where a thorough exam including retroflexion was performed.  Findings:   Prep satisfactory. Redundant colon with scattered diverticula at sigmoid colon. No evidence of recurrent polyps. Normal rectal mucosa. Small hemorrhoids below the dentate line.  Therapeutic/Diagnostic Maneuvers Performed:  None  Complications:  None  Cecal Withdrawal Time:  8 minutes  Impression:  Examination performed to cecum. Redundant colon with few diverticula at sigmoid colon. Small external hemorrhoids.  Recommendations:  Standard instructions given. Next colonoscopy in 5 years.  [name] U  08/27/2011 11:01 AM  CC: Dr. Garlan Fillers, MD, MD & Dr. Bonnetta Barry ref. provider  found

## 2011-08-27 NOTE — Discharge Instructions (Signed)
Resume usual medications and high fiber diet. No driving for 24 hours. Next colonoscopy in 5 yearsColon Polyps A polyp is extra tissue that grows inside your body. Colon polyps grow in the large intestine. The large intestine, also called the colon, is part of your digestive system. It is a long, hollow tube at the end of your digestive tract where your body makes and stores stool. Most polyps are not dangerous. They are benign. This means they are not cancerous. But over time, some types of polyps can turn into cancer. Polyps that are smaller than a pea are usually not harmful. But larger polyps could someday become or may already be cancerous. To be safe, doctors remove all polyps and test them.  WHO GETS POLYPS? Anyone can get polyps, but certain people are more likely than others. You may have a greater chance of getting polyps if:  You are over 50.   You have had polyps before.   Someone in your family has had polyps.   Someone in your family has had cancer of the large intestine.   Find out if someone in your family has had polyps. You may also be more likely to get polyps if you:   Eat a lot of fatty foods.   Smoke.   Drink alcohol.   Do not exercise.   Eat too much.  SYMPTOMS  Most small polyps do not cause symptoms. People often do not know they have one until their caregiver finds it during a regular checkup or while testing them for something else. Some people do have symptoms like these:  Bleeding from the anus. You might notice blood on your underwear or on toilet paper after you have had a bowel movement.   Constipation or diarrhea that lasts more than a week.   Blood in the stool. Blood can make stool look black or it can show up as red streaks in the stool.  If you have any of these symptoms, see your caregiver. HOW DOES THE DOCTOR TEST FOR POLYPS? The doctor can use four tests to check for polyps:  Digital rectal exam. The caregiver wears gloves and checks  your rectum (the last part of the large intestine) to see if it feels normal. This test would find polyps only in the rectum. Your caregiver may need to do one of the other tests listed below to find polyps higher up in the intestine.   Barium enema. The caregiver puts a liquid called barium into your rectum before taking x-rays of your large intestine. Barium makes your intestine look white in the pictures. Polyps are dark, so they are easy to see.   Sigmoidoscopy. With this test, the caregiver can see inside your large intestine. A thin flexible tube is placed into your rectum. The device is called a sigmoidoscope, which has a light and a tiny video camera in it. The caregiver uses the sigmoidoscope to look at the last third of your large intestine.   Colonoscopy. This test is like sigmoidoscopy, but the caregiver looks at all of the large intestine. It usually requires sedation. This is the most common method for finding and removing polyps.  TREATMENT   The caregiver will remove the polyp during sigmoidoscopy or colonoscopy. The polyp is then tested for cancer.   If you have had polyps, your caregiver may want you to get tested regularly in the future.  PREVENTION  There is not one sure way to prevent polyps. You might be able to lower  your risk of getting them if you:  Eat more fruits and vegetables and less fatty food.   Do not smoke.   Avoid alcohol.   Exercise every day.   Lose weight if you are overweight.   Eating more calcium and folate can also lower your risk of getting polyps. Some foods that are rich in calcium are milk, cheese, and broccoli. Some foods that are rich in folate are chickpeas, kidney beans, and spinach.   Aspirin might help prevent polyps. Studies are under way.  Document Released: 01/15/2004 Document Revised: 04/09/2011 Document Reviewed: 06/22/2007 [facility] Patient Information 2012 [place].

## 2011-08-28 ENCOUNTER — Encounter (HOSPITAL_COMMUNITY): Payer: Self-pay | Admitting: Internal Medicine

## 2011-09-03 ENCOUNTER — Encounter (INDEPENDENT_AMBULATORY_CARE_PROVIDER_SITE_OTHER): Payer: Self-pay

## 2011-09-14 ENCOUNTER — Encounter (INDEPENDENT_AMBULATORY_CARE_PROVIDER_SITE_OTHER): Payer: Self-pay

## 2012-02-03 ENCOUNTER — Other Ambulatory Visit: Payer: Self-pay | Admitting: Neurological Surgery

## 2012-02-03 ENCOUNTER — Other Ambulatory Visit (HOSPITAL_COMMUNITY): Payer: Self-pay | Admitting: Neurological Surgery

## 2012-02-03 DIAGNOSIS — M48061 Spinal stenosis, lumbar region without neurogenic claudication: Secondary | ICD-10-CM

## 2012-02-08 ENCOUNTER — Encounter (HOSPITAL_COMMUNITY): Payer: Self-pay | Admitting: Pharmacy Technician

## 2012-02-10 ENCOUNTER — Ambulatory Visit (HOSPITAL_COMMUNITY)
Admission: RE | Admit: 2012-02-10 | Discharge: 2012-02-10 | Disposition: A | Discharge status: Home / Self Care | Payer: Medicare Other | Source: Ambulatory Visit | Attending: Neurological Surgery | Admitting: Neurological Surgery

## 2012-02-10 ENCOUNTER — Encounter (HOSPITAL_COMMUNITY): Payer: Self-pay

## 2012-02-10 DIAGNOSIS — M48061 Spinal stenosis, lumbar region without neurogenic claudication: Secondary | ICD-10-CM | POA: Insufficient documentation

## 2012-02-10 MED ORDER — ONDANSETRON HCL 4 MG/2ML IJ SOLN: 4.0000 mg | Freq: Four times a day (QID) | INTRAMUSCULAR | Status: DC | PRN

## 2012-02-10 MED ORDER — DIAZEPAM 5 MG PO TABS
10.0000 mg | ORAL_TABLET | Freq: Once | ORAL | Status: AC
  Administered 2012-02-10: 10 mg via ORAL
  Filled 2012-02-10: qty 2

## 2012-02-10 MED ORDER — HYDROCODONE-ACETAMINOPHEN 5-325 MG PO TABS
ORAL_TABLET | ORAL | Status: AC
  Filled 2012-02-10: qty 2

## 2012-02-10 MED ORDER — IOHEXOL 180 MG/ML  SOLN
20.0000 mL | Freq: Once | INTRAMUSCULAR | Status: AC | PRN
  Administered 2012-02-10: 12 mL via INTRATHECAL

## 2012-02-10 MED ORDER — HYDROCODONE-ACETAMINOPHEN 5-325 MG PO TABS: 1.0000 | ORAL_TABLET | ORAL | Status: DC | PRN

## 2012-02-10 NOTE — Procedures (Signed)
Carlos Hanson   DOB:  08/23/1940 #[id number]    January 27, 2012   HISTORY:     Carlos Hanson returns to the office today. I had last visited with him back in November of 2009 when he was recovering from an X-Stop procedure at L4-5.  Since that time, he has been seen by Dr. Jordan Likes for pain management and he has had a number of procedures done including epidural steroid injections, facet blocks, facet rhizotomies, and medical pain management. He notes that over time this process has evolved and he now notes that he has the capacity to walk about 50 yards before he has to stop and rest.  He notes the singular biggest problem is centralized localized back pain. He does get pain in his right leg more than in his left leg, but his overall capacity to perform activities of daily living and do any kind of exercise is becoming substantially more limited. He has been on a number of pain medications over the past and currently is managed with Morphine Sulfate 30 mg. per day.  He is also on a number of other medications including Losartan, Crestor, Pradaxa, Furosemide, Triamterene/Hydrochlorothiazide, Pantoprazole, Amiodarone, Allopurinol, Diltiazem, and a Fish Oil supplement.  He notes that he CANNOT TOLERATE NAPROXEN AS IT GIVES HIM A RASH.  He has been on the Pradaxa since he was diagnosed with atrial fibrillation.  He had a cardioversion which was successful after about a week, but he has been maintained on the Pradaxa still.    PHYSICAL EXAMINATION:  On physical examination, I note that he has good motor strength in the iliopsoas, the quadriceps, tibialis anterior, and the gastrocnemius.  He ha absent patellar and Achilles reflexes.    DIAGNOSTIC STUDIES:   Because of the preponderance of pain on that right side, today in the office I obtained a new flexion and extension lumbar spine film in addition to a singular AP view of the pelvis which demonstrates both hips.  I note that the hip joints are essentially normal.     The lumbar spine does not demonstrate any degenerative listhesis between flexion and extension.  There is some slight retrolisthesis of L2 on 3 and L3 on 4, but this is less than 2 mm. in maximum movement.    I also reviewed the patient's MRI from November of 2012 that was performed at [facility].  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  There is some effacement of the lateral aspect of the canal at that level.  There is also some spondylitic changes at L5-S1.    IMPRESSION:    I believe that regarding the patient's symptoms, the most significant finding is at L3-4 where there is a presence of a synovial cyst.  He has had all manner of conservative treatment with epidural steroid injections, facet blocks, and even facet ablations. He notes the greatest degree of relief that he has had recently was with acupuncture, but even this has worn its course.  After review of these films and the current findings, I noted to the patient that I am concerned about the synovial cyst and further stenosis at the level above his previously placed X-Stop.  I  suggested the best way to work this up most completely would be with a myelogram and post-myelogram CT scan.  I indicated that the myelogram involves the performance of a spinal tap, injection of dye into the spinal canal to outline the paths of the nerves, then however, we can stand the patient, have him bend and twist to see how his canal anatomy changes with movement.  We also then do a CT scan to see what stenosis there is in the neutral position.  The thought is, if there is a focal area of stenosis that can be decompressed it may substantially improve is overall function, though the possibility is that the synovial  cyst has resolved itself and there may not be anything surgical that we can offer. However, at this point, Carlos Hanson has been having a progressive deterioration in his level of function and I believe that the most definitive answer in terms of any anatomic process that may be going on in his back would be yielded with a myelogram and post-myelogram CT scan.  Carlos Hanson states that he will be out of town for the next week. We suggested a six-day course of  Prednisone to see if this would help with his symptom complex in the meantime while we are planning on scheduling the myelogram for the future.  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

## 2012-02-19 ENCOUNTER — Other Ambulatory Visit: Payer: Self-pay | Admitting: Neurological Surgery

## 2012-03-02 ENCOUNTER — Encounter (HOSPITAL_COMMUNITY): Payer: Self-pay | Admitting: Pharmacy Technician

## 2012-03-02 NOTE — Pre-Procedure Instructions (Signed)
19 Carlos Hanson  03/02/2012   Your procedure is scheduled on:  Tuesday November 12  Report to [facility] at 5:30 AM.  Call this number if you have problems the morning of surgery: [phone]   Remember:   Do not eat or drink:After Midnight.    Take these medicines the morning of surgery with A SIP OF WATER: Allopurinol, amiodarone, diltiazem, morphine, pantoprazole   Do not wear jewelry, make-up or nail polish.  Do not wear lotions, powders, or perfumes. You may wear deodorant.  Do not shave 48 hours prior to surgery. Men may shave face and neck.  Do not bring valuables to the hospital.  Contacts, dentures or bridgework may not be worn into surgery.  Leave suitcase in the car. After surgery it may be brought to your room.  For patients admitted to the hospital, checkout time is 11:00 AM the day of discharge.   Patients discharged the day of surgery will not be allowed to drive home.  Name and phone number of your driver: NA  Special Instructions: Shower using CHG 2 nights before surgery and the night before surgery.  If you shower the day of surgery use CHG.  Use special wash - you have one bottle of CHG for all showers.  You should use approximately 1/3 of the bottle for each shower.   Please read over the following fact sheets that you were given: Pain Booklet, Coughing and Deep Breathing and Surgical Site Infection Prevention

## 2012-03-03 ENCOUNTER — Other Ambulatory Visit: Payer: Self-pay

## 2012-03-03 ENCOUNTER — Encounter (HOSPITAL_COMMUNITY): Payer: Self-pay

## 2012-03-03 ENCOUNTER — Encounter (HOSPITAL_COMMUNITY)
Admission: RE | Admit: 2012-03-03 | Discharge: 2012-03-03 | Disposition: A | Discharge status: Home / Self Care | Payer: Medicare Other | Source: Ambulatory Visit | Attending: Neurological Surgery | Admitting: Neurological Surgery

## 2012-03-03 HISTORY — DX: Personal history of peptic ulcer disease: Z87.11

## 2012-03-03 HISTORY — DX: Personal history of urinary (tract) infections: Z87.440

## 2012-03-03 HISTORY — DX: Unspecified osteoarthritis, unspecified site: M19.90

## 2012-03-03 HISTORY — DX: Nocturia: R35.1

## 2012-03-03 HISTORY — DX: Personal history of other diseases of the digestive system: Z87.19

## 2012-03-03 HISTORY — DX: Unspecified hearing loss, unspecified ear: H91.90

## 2012-03-03 LAB — SURGICAL PCR SCREEN: MRSA, PCR: NEGATIVE

## 2012-03-03 LAB — CBC
Platelets: 289 10*3/uL (ref 150–400)
RBC: 4.21 MIL/uL — ABNORMAL LOW (ref 4.22–5.81)
RDW: 14 % (ref 11.5–15.5)
WBC: 6.1 10*3/uL (ref 4.0–10.5)

## 2012-03-03 LAB — BASIC METABOLIC PANEL
Calcium: 9.6 mg/dL (ref 8.4–10.5)
Chloride: 101 mEq/L (ref 96–112)
Creatinine, Ser: 1.32 mg/dL (ref 0.50–1.35)
GFR calc Af Amer: 61 mL/min — ABNORMAL LOW (ref >90)
Sodium: 139 mEq/L (ref 135–145)

## 2012-03-03 NOTE — Progress Notes (Signed)
Sleep Study requested from [facility].

## 2012-03-03 NOTE — Progress Notes (Signed)
Last OV and any available cardiac testing requested from Dr. Verl Dicker office

## 2012-03-04 ENCOUNTER — Encounter (HOSPITAL_COMMUNITY): Payer: Self-pay | Admitting: Vascular Surgery

## 2012-03-04 NOTE — Consult Note (Signed)
Anesthesia chart review: Patient is a 71 year old male scheduled for L2-3, L3-4 X-Stop on 03/15/12 by Dr. Danielle Dess.  History includes lumbar stenosis, former smoker, morbid obesity, obstructive sleep apnea on CPAP, hypertension, paroxysmal atrial fibrillation with failed cardioversion on 05/01/2010, hypercholesterolemia, 40% mid LAD stenosis by cath '11.  His Cardiologist is Dr. Jacinto Halim who is aware of planned procedure and gave instructions on when patient could hold his Pradaxa.    Cardiac cath on 03/25/10 showed normal left ventricular systolic function, EF 60-65%. Moderate to marked elevation left ventricular end-diastolic pressure suggestive of significant diastolic dysfunction. Moderate coronary artery disease including a 40% mid left anterior descending stenosis at the bifurcation of the large diagonal 2 including the involvement of the diagonal 2 ostium. Slow flow was evident throughout the coronary arteries which improved with intracoronary nitroglycerin administration suggestive of microvascular disease.  Echo on 03/26/10 showed: - Left ventricle: The cavity size was normal. There was focal basal and moderate concentric hypertrophy. Systolic function was normal. The estimated ejection fraction was in the range of 60% to 65%. Doppler parameters are consistent with elevated mean left atrial filling pressure. - Ventricular septum: The contour showed systolic flattening. - Left atrium: The atrium was moderately dilated. - Right ventricle: The cavity size was mildly dilated.  EKG on 03/03/2012 showed sinus rhythm with PACs, right bundle branch block, left anterior fascicular block, bifascicular block, inferior infarct, age undetermined. Overall it appears stable when compared to his EKG from 05/21/2011 (Dr. Jacinto Halim).  CXR on 03/03/12 showed no active disease. Mild degenerative changes of the thoracic spine.   Labs noted.  Cr 1.32.  Glucose 109.  H/H 13.9/40.3.  Will order a T&S for the day of surgery.     Shonna Chock, PA-C

## 2012-03-14 MED ORDER — DEXTROSE 5 % IV SOLN
3.0000 g | INTRAVENOUS | Status: AC
  Administered 2012-03-15: 3 g via INTRAVENOUS
  Filled 2012-03-14: qty 3000

## 2012-03-15 ENCOUNTER — Encounter (HOSPITAL_COMMUNITY): Payer: Self-pay

## 2012-03-15 ENCOUNTER — Observation Stay (HOSPITAL_COMMUNITY)
Admission: RE | Admit: 2012-03-15 | Discharge: 2012-03-15 | Disposition: A | Discharge status: Home / Self Care | Payer: Medicare Other | Source: Ambulatory Visit | Attending: Neurological Surgery | Admitting: Neurological Surgery

## 2012-03-15 ENCOUNTER — Ambulatory Visit (HOSPITAL_COMMUNITY): Payer: Medicare Other | Admitting: Vascular Surgery

## 2012-03-15 ENCOUNTER — Encounter (HOSPITAL_COMMUNITY)
Admission: RE | Disposition: A | Discharge status: Home / Self Care | Payer: Self-pay | Source: Ambulatory Visit | Attending: Neurological Surgery

## 2012-03-15 ENCOUNTER — Encounter (HOSPITAL_COMMUNITY): Payer: Self-pay | Admitting: Vascular Surgery

## 2012-03-15 ENCOUNTER — Ambulatory Visit (HOSPITAL_COMMUNITY): Payer: Medicare Other

## 2012-03-15 DIAGNOSIS — F17200 Nicotine dependence, unspecified, uncomplicated: Secondary | ICD-10-CM | POA: Insufficient documentation

## 2012-03-15 DIAGNOSIS — M48061 Spinal stenosis, lumbar region without neurogenic claudication: Secondary | ICD-10-CM

## 2012-03-15 DIAGNOSIS — Z79899 Other long term (current) drug therapy: Secondary | ICD-10-CM | POA: Insufficient documentation

## 2012-03-15 DIAGNOSIS — I2510 Atherosclerotic heart disease of native coronary artery without angina pectoris: Secondary | ICD-10-CM | POA: Insufficient documentation

## 2012-03-15 DIAGNOSIS — M48062 Spinal stenosis, lumbar region with neurogenic claudication: Principal | ICD-10-CM | POA: Insufficient documentation

## 2012-03-15 DIAGNOSIS — I10 Essential (primary) hypertension: Secondary | ICD-10-CM | POA: Insufficient documentation

## 2012-03-15 HISTORY — PX: LUMBAR LAMINECTOMY: SHX95

## 2012-03-15 LAB — TYPE AND SCREEN: Antibody Screen: NEGATIVE

## 2012-03-15 SURGERY — LUMBAR LAMINECTOMY WITH  X-STOP 2 LEVEL
Anesthesia: General | Site: Spine Lumbar | Wound class: Clean

## 2012-03-15 MED ORDER — BUPIVACAINE HCL (PF) 0.25 % IJ SOLN: INTRAMUSCULAR | Status: DC | PRN

## 2012-03-15 MED ORDER — HYDROMORPHONE HCL PF 1 MG/ML IJ SOLN
INTRAMUSCULAR | Status: AC
  Filled 2012-03-15: qty 1

## 2012-03-15 MED ORDER — NEOSTIGMINE METHYLSULFATE 1 MG/ML IJ SOLN
INTRAMUSCULAR | Status: DC | PRN
  Administered 2012-03-15: 4 mg via INTRAVENOUS

## 2012-03-15 MED ORDER — DIAZEPAM 5 MG PO TABS
5.0000 mg | ORAL_TABLET | Freq: Four times a day (QID) | ORAL | Status: DC | PRN
  Administered 2012-03-15: 5 mg via ORAL

## 2012-03-15 MED ORDER — ACETAMINOPHEN 650 MG RE SUPP: 650.0000 mg | RECTAL | Status: DC | PRN

## 2012-03-15 MED ORDER — OXYCODONE-ACETAMINOPHEN 5-325 MG PO TABS: 1.0000 | ORAL_TABLET | ORAL | Status: DC | PRN

## 2012-03-15 MED ORDER — ONDANSETRON HCL 4 MG/2ML IJ SOLN
INTRAMUSCULAR | Status: DC | PRN
  Administered 2012-03-15: 4 mg via INTRAVENOUS

## 2012-03-15 MED ORDER — SODIUM CHLORIDE 0.9 % IJ SOLN
3.0000 mL | Freq: Two times a day (BID) | INTRAMUSCULAR | Status: DC
  Administered 2012-03-15: 3 mL via INTRAVENOUS

## 2012-03-15 MED ORDER — DABIGATRAN ETEXILATE MESYLATE 150 MG PO CAPS
150.0000 mg | ORAL_CAPSULE | Freq: Two times a day (BID) | ORAL | Status: DC
  Filled 2012-03-15: qty 1

## 2012-03-15 MED ORDER — DIAZEPAM 5 MG PO TABS: 5.0000 mg | ORAL_TABLET | Freq: Four times a day (QID) | ORAL | Status: DC | PRN

## 2012-03-15 MED ORDER — PANTOPRAZOLE SODIUM 40 MG PO TBEC: 40.0000 mg | DELAYED_RELEASE_TABLET | Freq: Two times a day (BID) | ORAL | Status: DC

## 2012-03-15 MED ORDER — ALLOPURINOL 300 MG PO TABS: 300.0000 mg | ORAL_TABLET | Freq: Every day | ORAL | Status: DC

## 2012-03-15 MED ORDER — MORPHINE SULFATE 15 MG PO TABS: 30.0000 mg | ORAL_TABLET | Freq: Every day | ORAL | Status: DC

## 2012-03-15 MED ORDER — LACTATED RINGERS IV SOLN
INTRAVENOUS | Status: DC | PRN
  Administered 2012-03-15 (×2): via INTRAVENOUS

## 2012-03-15 MED ORDER — DIAZEPAM 5 MG PO TABS
ORAL_TABLET | ORAL | Status: AC
  Filled 2012-03-15: qty 1

## 2012-03-15 MED ORDER — LIDOCAINE-EPINEPHRINE 1 %-1:100000 IJ SOLN
INTRAMUSCULAR | Status: DC | PRN
  Administered 2012-03-15: 5 mL

## 2012-03-15 MED ORDER — BUPROPION HCL ER (SR) 100 MG PO TB12: 100.0000 mg | ORAL_TABLET | Freq: Every day | ORAL | Status: DC

## 2012-03-15 MED ORDER — ARTIFICIAL TEARS OP OINT
TOPICAL_OINTMENT | OPHTHALMIC | Status: DC | PRN
  Administered 2012-03-15: 1 via OPHTHALMIC

## 2012-03-15 MED ORDER — PHENYLEPHRINE HCL 10 MG/ML IJ SOLN
INTRAMUSCULAR | Status: DC | PRN
  Administered 2012-03-15 (×4): 40 ug via INTRAVENOUS
  Administered 2012-03-15: 80 ug via INTRAVENOUS

## 2012-03-15 MED ORDER — ACETAMINOPHEN 325 MG PO TABS
650.0000 mg | ORAL_TABLET | ORAL | Status: DC | PRN
Start: 2012-03-15 — End: 2012-03-15

## 2012-03-15 MED ORDER — CEFAZOLIN SODIUM 1-5 GM-% IV SOLN
1.0000 g | Freq: Three times a day (TID) | INTRAVENOUS | Status: DC
  Administered 2012-03-15: 1 g via INTRAVENOUS
  Filled 2012-03-15 (×2): qty 50

## 2012-03-15 MED ORDER — FUROSEMIDE 20 MG PO TABS: 20.0000 mg | ORAL_TABLET | Freq: Every day | ORAL | Status: DC

## 2012-03-15 MED ORDER — OXYCODONE-ACETAMINOPHEN 5-325 MG PO TABS
1.0000 | ORAL_TABLET | ORAL | Status: DC | PRN
  Administered 2012-03-15 (×2): 2 via ORAL
  Filled 2012-03-15: qty 2

## 2012-03-15 MED ORDER — 0.9 % SODIUM CHLORIDE (POUR BTL) OPTIME
TOPICAL | Status: DC | PRN
  Administered 2012-03-15: 1000 mL

## 2012-03-15 MED ORDER — ROCURONIUM BROMIDE 100 MG/10ML IV SOLN
INTRAVENOUS | Status: DC | PRN
  Administered 2012-03-15: 50 mg via INTRAVENOUS

## 2012-03-15 MED ORDER — ESMOLOL HCL 10 MG/ML IV SOLN
INTRAVENOUS | Status: DC | PRN
  Administered 2012-03-15: 20 mg via INTRAVENOUS

## 2012-03-15 MED ORDER — ONDANSETRON HCL 4 MG/2ML IJ SOLN: 4.0000 mg | INTRAMUSCULAR | Status: DC | PRN

## 2012-03-15 MED ORDER — ONDANSETRON HCL 4 MG/2ML IJ SOLN: 4.0000 mg | Freq: Once | INTRAMUSCULAR | Status: DC | PRN

## 2012-03-15 MED ORDER — NORTRIPTYLINE HCL 25 MG PO CAPS
25.0000 mg | ORAL_CAPSULE | Freq: Every day | ORAL | Status: DC
  Filled 2012-03-15: qty 1

## 2012-03-15 MED ORDER — HYDROMORPHONE HCL PF 1 MG/ML IJ SOLN
0.2500 mg | INTRAMUSCULAR | Status: DC | PRN
  Administered 2012-03-15 (×2): 0.5 mg via INTRAVENOUS

## 2012-03-15 MED ORDER — GLYCOPYRROLATE 0.2 MG/ML IJ SOLN
INTRAMUSCULAR | Status: DC | PRN
  Administered 2012-03-15: .6 mg via INTRAVENOUS

## 2012-03-15 MED ORDER — BACITRACIN 50000 UNITS IM SOLR
INTRAMUSCULAR | Status: AC
  Filled 2012-03-15: qty 1

## 2012-03-15 MED ORDER — LOSARTAN POTASSIUM 50 MG PO TABS: 100.0000 mg | ORAL_TABLET | Freq: Every day | ORAL | Status: DC

## 2012-03-15 MED ORDER — KETOROLAC TROMETHAMINE 15 MG/ML IJ SOLN
INTRAMUSCULAR | Status: DC | PRN
  Administered 2012-03-15: 15 mg via INTRAVENOUS

## 2012-03-15 MED ORDER — MENTHOL 3 MG MT LOZG
1.0000 | LOZENGE | OROMUCOSAL | Status: DC | PRN
  Administered 2012-03-15: 3 mg via ORAL
  Filled 2012-03-15: qty 9

## 2012-03-15 MED ORDER — BUPIVACAINE HCL (PF) 0.5 % IJ SOLN
INTRAMUSCULAR | Status: DC | PRN
  Administered 2012-03-15: 20 mL
  Administered 2012-03-15: 5 mL

## 2012-03-15 MED ORDER — OXYCODONE-ACETAMINOPHEN 5-325 MG PO TABS
ORAL_TABLET | ORAL | Status: AC
  Filled 2012-03-15: qty 2

## 2012-03-15 MED ORDER — AMIODARONE HCL 200 MG PO TABS: 200.0000 mg | ORAL_TABLET | Freq: Every day | ORAL | Status: DC

## 2012-03-15 MED ORDER — PHENOL 1.4 % MT LIQD: 1.0000 | OROMUCOSAL | Status: DC | PRN

## 2012-03-15 MED ORDER — SODIUM CHLORIDE 0.9 % IJ SOLN: 3.0000 mL | INTRAMUSCULAR | Status: DC | PRN

## 2012-03-15 MED ORDER — SODIUM CHLORIDE 0.9 % IV SOLN
INTRAVENOUS | Status: AC
  Filled 2012-03-15: qty 500

## 2012-03-15 MED ORDER — ALUM & MAG HYDROXIDE-SIMETH 200-200-20 MG/5ML PO SUSP: 30.0000 mL | Freq: Four times a day (QID) | ORAL | Status: DC | PRN

## 2012-03-15 MED ORDER — FENTANYL CITRATE 0.05 MG/ML IJ SOLN
INTRAMUSCULAR | Status: DC | PRN
  Administered 2012-03-15: 50 ug via INTRAVENOUS
  Administered 2012-03-15: 150 ug via INTRAVENOUS
  Administered 2012-03-15: 50 ug via INTRAVENOUS

## 2012-03-15 MED ORDER — THROMBIN 5000 UNITS EX KIT
PACK | CUTANEOUS | Status: DC | PRN
  Administered 2012-03-15 (×2): 5000 [IU] via TOPICAL

## 2012-03-15 MED ORDER — HEMOSTATIC AGENTS (NO CHARGE) OPTIME
TOPICAL | Status: DC | PRN
  Administered 2012-03-15: 1 via TOPICAL

## 2012-03-15 MED ORDER — TRIAMTERENE-HCTZ 37.5-25 MG PO TABS: 0.5000 | ORAL_TABLET | Freq: Every day | ORAL | Status: DC

## 2012-03-15 MED ORDER — DILTIAZEM HCL ER 180 MG PO CP24: 180.0000 mg | ORAL_CAPSULE | Freq: Every day | ORAL | Status: DC

## 2012-03-15 MED ORDER — PROPOFOL 10 MG/ML IV BOLUS
INTRAVENOUS | Status: DC | PRN
  Administered 2012-03-15: 360 mg via INTRAVENOUS

## 2012-03-15 MED ORDER — MORPHINE SULFATE 2 MG/ML IJ SOLN
1.0000 mg | INTRAMUSCULAR | Status: DC | PRN
  Administered 2012-03-15: 4 mg via INTRAVENOUS
  Filled 2012-03-15: qty 2

## 2012-03-15 MED ORDER — SODIUM CHLORIDE 0.9 % IV SOLN: 250.0000 mL | INTRAVENOUS | Status: DC

## 2012-03-15 MED ORDER — SODIUM CHLORIDE 0.9 % IR SOLN
Status: DC | PRN
  Administered 2012-03-15: 09:00:00

## 2012-03-15 SURGICAL SUPPLY — 50 items
BAG DECANTER FOR FLEXI CONT (MISCELLANEOUS) ×2 IMPLANT
BUR MATCHSTICK NEURO 3.0 LAGG (BURR) ×2 IMPLANT
CANISTER SUCTION 2500CC (MISCELLANEOUS) ×2 IMPLANT
CLOTH BEACON ORANGE TIMEOUT ST (SAFETY) ×2 IMPLANT
CONT SPEC 4OZ CLIKSEAL STRL BL (MISCELLANEOUS) ×2 IMPLANT
DECANTER SPIKE VIAL GLASS SM (MISCELLANEOUS) ×2 IMPLANT
DERMABOND ADVANCED (GAUZE/BANDAGES/DRESSINGS) ×1
DERMABOND ADVANCED .7 DNX12 (GAUZE/BANDAGES/DRESSINGS) ×1 IMPLANT
DRAPE C-ARM 42X72 X-RAY (DRAPES) ×4 IMPLANT
DRAPE LAPAROTOMY 100X72 PEDS (DRAPES) ×2 IMPLANT
DRAPE POUCH INSTRU U-SHP 10X18 (DRAPES) ×2 IMPLANT
DURAPREP 26ML APPLICATOR (WOUND CARE) ×2 IMPLANT
ELECT REM PT RETURN 9FT ADLT (ELECTROSURGICAL) ×2
ELECTRODE REM PT RTRN 9FT ADLT (ELECTROSURGICAL) ×1 IMPLANT
GAUZE SPONGE 4X4 16PLY XRAY LF (GAUZE/BANDAGES/DRESSINGS) IMPLANT
GLOVE BIO SURGEON STRL SZ7.5 (GLOVE) IMPLANT
GLOVE BIOGEL PI IND STRL 7.5 (GLOVE) IMPLANT
GLOVE BIOGEL PI IND STRL 8.5 (GLOVE) ×2 IMPLANT
GLOVE BIOGEL PI INDICATOR 7.5 (GLOVE)
GLOVE BIOGEL PI INDICATOR 8.5 (GLOVE) ×2
GLOVE ECLIPSE 7.5 STRL STRAW (GLOVE) ×2 IMPLANT
GLOVE ECLIPSE 8.5 STRL (GLOVE) ×4 IMPLANT
GLOVE EXAM NITRILE LRG STRL (GLOVE) IMPLANT
GLOVE EXAM NITRILE MD LF STRL (GLOVE) IMPLANT
GLOVE EXAM NITRILE XL STR (GLOVE) IMPLANT
GLOVE EXAM NITRILE XS STR PU (GLOVE) IMPLANT
GOWN BRE IMP SLV AUR LG STRL (GOWN DISPOSABLE) ×4 IMPLANT
GOWN BRE IMP SLV AUR XL STRL (GOWN DISPOSABLE) IMPLANT
GOWN STRL REIN 2XL LVL4 (GOWN DISPOSABLE) ×2 IMPLANT
KIT BASIN OR (CUSTOM PROCEDURE TRAY) ×2 IMPLANT
KIT ROOM TURNOVER OR (KITS) ×2 IMPLANT
NEEDLE HYPO 22GX1.5 SAFETY (NEEDLE) ×2 IMPLANT
NEEDLE SPNL 20GX3.5 QUINCKE YW (NEEDLE) ×2 IMPLANT
NS IRRIG 1000ML POUR BTL (IV SOLUTION) ×2 IMPLANT
PACK LAMINECTOMY NEURO (CUSTOM PROCEDURE TRAY) ×2 IMPLANT
PAD ARMBOARD 7.5X6 YLW CONV (MISCELLANEOUS) ×6 IMPLANT
SPONGE LAP 4X18 X RAY DECT (DISPOSABLE) IMPLANT
SPONGE SURGIFOAM ABS GEL SZ50 (HEMOSTASIS) ×2 IMPLANT
STAPLER SKIN PROX WIDE 3.9 (STAPLE) IMPLANT
SUT VIC AB 1 CT1 18XBRD ANBCTR (SUTURE) ×1 IMPLANT
SUT VIC AB 1 CT1 8-18 (SUTURE) ×1
SUT VIC AB 2-0 CP2 18 (SUTURE) ×2 IMPLANT
SUT VIC AB 3-0 SH 8-18 (SUTURE) ×4 IMPLANT
SUT VIC AB 4-0 P-3 18X BRD (SUTURE) ×1 IMPLANT
SUT VIC AB 4-0 P3 18 (SUTURE) ×1
SYR 20CC LL (SYRINGE) ×2 IMPLANT
TOWEL OR 17X24 6PK STRL BLUE (TOWEL DISPOSABLE) ×2 IMPLANT
TOWEL OR 17X26 10 PK STRL BLUE (TOWEL DISPOSABLE) ×2 IMPLANT
WATER STERILE IRR 1000ML POUR (IV SOLUTION) ×2 IMPLANT
X STOP  14MM (Orthopedic Implant) ×4 IMPLANT

## 2012-03-15 NOTE — Anesthesia Preprocedure Evaluation (Signed)
Anesthesia Evaluation  Patient identified by MRN, date of birth, ID band Patient awake    Reviewed: Allergy & Precautions, H&P , NPO status , Patient's Chart, lab work & pertinent test results  Airway Mallampati: I TM Distance: >3 FB Neck ROM: full    Dental   Pulmonary sleep apnea , Current Smoker,          Cardiovascular hypertension, + CAD + dysrhythmias Rhythm:regular Rate:Normal     Neuro/Psych    GI/Hepatic hiatal hernia,   Endo/Other    Renal/GU      Musculoskeletal   Abdominal   Peds  Hematology   Anesthesia Other Findings   Reproductive/Obstetrics                           Anesthesia Physical Anesthesia Plan  ASA: III  Anesthesia Plan: General   Post-op Pain Management:    Induction: Intravenous  Airway Management Planned: Oral ETT  Additional Equipment:   Intra-op Plan:   Post-operative Plan: Extubation in OR  Informed Consent: I have reviewed the patients History and Physical, chart, labs and discussed the procedure including the risks, benefits and alternatives for the proposed anesthesia with the patient or authorized representative who has indicated his/her understanding and acceptance.     Plan Discussed with: CRNA, Anesthesiologist and Surgeon  Anesthesia Plan Comments:         Anesthesia Quick Evaluation

## 2012-03-15 NOTE — Anesthesia Procedure Notes (Signed)
Procedure Name: Intubation Date/Time: 03/15/2012 8:07 AM Performed by: Luster Landsberg Pre-anesthesia Checklist: Patient identified, Emergency Drugs available, Suction available and Patient being monitored Patient Re-evaluated:Patient Re-evaluated prior to inductionOxygen Delivery Method: Circle system utilized Preoxygenation: Pre-oxygenation with 100% oxygen Intubation Type: IV induction Ventilation: Oral airway inserted - appropriate to patient size and Two handed mask ventilation required Laryngoscope Size: Mac and 4 Grade View: Grade II Tube type: Oral Tube size: 8.0 mm Number of attempts: 1 Airway Equipment and Method: Stylet Placement Confirmation: ETT inserted through vocal cords under direct vision,  positive ETCO2 and breath sounds checked- equal and bilateral Secured at: 24 cm Tube secured with: Tape Dental Injury: Teeth and Oropharynx as per pre-operative assessment

## 2012-03-15 NOTE — H&P (Signed)
HISTORY: I had last visited with him back in November of 2009 when he was recovering from an X-Stop procedure at L4-5.  Since that time, he has been seen by Dr. Jordan Likes for pain management and he has had a number of procedures done including epidural steroid injections, facet blocks, facet rhizotomies, and medical pain management. He notes that over time this process has evolved and he now notes that he has the capacity to walk about 50 yards before he has to stop and rest.  He notes the singular biggest problem is centralized localized back pain. He does get pain in his right leg more than in his left leg, but his overall capacity to perform activities of daily living and do any kind of exercise is becoming substantially more limited. He has been on a number of pain medications over the past and currently is managed with Morphine Sulfate 30 mg. per day.  He is also on a number of other medications including Losartan, Crestor, Pradaxa, Furosemide, Triamterene/Hydrochlorothiazide, Pantoprazole, Amiodarone, Allopurinol, Diltiazem, and a Fish Oil supplement.  He notes that he CANNOT TOLERATE NAPROXEN AS IT GIVES HIM A RASH.  He has been on the Pradaxa since he was diagnosed with atrial fibrillation.  He had a cardioversion which was successful after about a week, but he has been maintained on the Pradaxa still.    PHYSICAL EXAMINATION:  On physical examination, I note that he has good motor strength in the iliopsoas, the quadriceps, tibialis anterior, and the gastrocnemius.  He ha absent patellar and Achilles reflexes.    DIAGNOSTIC STUDIES:   Because of the preponderance of pain on that right side, today in the office I obtained a new flexion and extension lumbar spine film in addition to a singular AP view of the pelvis which demonstrates both hips.  I note that the hip joints are essentially normal.    The lumbar spine does not demonstrate any degenerative listhesis between flexion and extension.  There is  some slight retrolisthesis of L2 on 3 and L3 on 4, but this is less than 2 mm. in maximum movement.    I also reviewed the patient's MRI from November of 2012 that was performed at [facility].  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  There is some effacement of the lateral aspect of the canal at that level.  There is also some spondylitic changes at L5-S1.    IMPRESSION:    I believe that regarding the patient's symptoms, the most significant finding is at L3-4 where there is a presence of a synovial cyst.  He has had all manner of conservative treatment with epidural steroid injections, facet blocks, and even facet ablations. He notes the greatest degree of relief that he has had recently was with acupuncture, but even this has worn its course.  After review of these films and the current findings, I noted to the patient that I am concerned about the synovial cyst and further stenosis at the level above his previously placed X-Stop.  I suggested the best way to work this up most completely would be with a myelogram and post-myelogram CT scan.  I indicated that the myelogram involves the performance of a spinal tap, injection of dye into the spinal canal to outline the paths of the nerves, then however, we  can stand the patient, have him bend and twist to see how his canal anatomy changes with movement.  We also then do a CT scan to see what stenosis there is in the neutral position.  The thought is, if there is a focal area of stenosis that can be decompressed it may substantially improve is overall function, though the possibility is that the synovial cyst has resolved itself and there may not be anything surgical that we can offer. However, at this point, Carlos Hanson has been having a progressive deterioration in his level of function and I  believe that the most definitive answer in terms of any anatomic process that may be going on in his back would be yielded with a myelogram and post-myelogram CT scan.   We suggested a six-day course of  Prednisone to see if thisMr.  The results of his myelogram and postmyelogram CAT scan.  In 2009 I placed an X-Stop at L4-L5 for significant radicular pain and Carlos Hanson did reasonably.  He has had recurrence of radicular pain more in the right lower extremity into the anterior thigh and into both lower extremities at times.  I was concerned that what we would ultimately see was that he had collapse of the disc space at L4-5 and worsening local disease and possibly some adjacent disease.    What I do see on the myelogram and postmyelogram CAT scan is that L4-L5 actually appears quite well decompressed both in the central canal and in the lateral recess.  In fact it appears that some of his lateral recess stenosis has regressed.  What is notable is that at the adjacent level, L3-4 and then at L2-3, he has some mild and moderate spinal stenosis at each of these levels.  This is not unlike what he had preoperatively at L4-L5 in 2009.  The condition is moderately severe, but not severe enough to warrant that decompression and fusion would be an appropriate consideration.  Yet, with the patient having significant symptoms, I do believe that he warrants some element of decompression.  In light of the fact that the X-Stop had worked well at the L4-L5 level, I have suggested to Carlos Hanson that the simplest and quickest procedure that may give him the best relief of his current symptoms would be to do X-Stop distractions at L2-3 and L3-4.  The procedure he is already familiar with would involve the adjacent two levels.  The recovery would be about the same as what he had experienced previously.  In light of his comorbidities and the fact that he is having symptomatic stenosis, I believe that X-Stop placement at L2-3 and  L3-4 without any attention to L4-5 would in his best interest in an effort to gain some relief.  He is admitted for surgery today.

## 2012-03-15 NOTE — Op Note (Signed)
Preoperative diagnosis: Lumbar stenosis with neurogenic claudication, status post ex-.L4-L5 2009 Postoperative diagnosis: Lumbar stenosis with neurogenic claudication, status post X-Stop L4-L5 Procedure: X-Stop placement L2-3 and L3-4 with fluoroscopic guidance and interpretation. Surgeon: Barnett Abu First assistant: Colon Branch Anesthesia: Gen. endotracheal Indications: The patient is a 71 year old male who had symptoms of radiculopathy and neurogenic claudication back in 2009 he underwent placement of next period for several he are C8 good relief. In the last year or so he's had progressively worsening back pain and leg pain particularly with standing or walking any distance he notes that he gets good relief when he flexes floor. Patient has morbid obesity with a body mass index of 44. He's been advised regarding placement of a next up at L2-3 and L3-4 where he has significant lateral recess stenosis and central canal stenosis that is contributing to his symptoms. He is now taken to the operating room for this procedure.  Procedure: The patient was brought to the operating room supine on a stretcher after the smooth induction of general endotracheal anesthesia, he was turned prone. The back was prepped with alcohol and DuraPrep. He was then draped in a sterile fashion. A midline incision was created from the midportion of his previously made incision carried superiorly for a distance of approximately 12 cm. Dissection was carried to the lumbar dorsal fascia the fascia was opened on either side of midline. First interspinous space was then localized as that of L3-4 above his previously placed X-Stop. The base of the interspinous ligament was then probed and cleared and a curved awl was placed through the interspinous ligament at the base. A larger awl was then placed in this place. Then interspinous dilator was placed into the interspinous space and this was dilated under considerable tension to 14 mm  diameter. After a period of time and some relaxation the interspinous space could be dilated to 16 mm. A 14 mm X-Stop was chosen to be placed from the right to left side in his interspace. With some distraction being applied on the spinous process with the help of Dr. Phoebe Perch we were able to place the 14 mm X-Stop at L3-L4. It was then torqued into position and locked in place. The interspinous tissues were then cleared of L2-L3 and a similar process was repeated here and a 14 mm X-Stop was placed in the L2-L3 interspace. Again distraction on the spinous process was required with significant force to place the X. stop at L2-L3. It was torqued into final position and final radiographs were obtained with fluoroscopy confirming good position of the X. stops. The incision was then inspected for hemostasis and the lumbar dorsal fascia was closed with #1 Vicryl in interrupted fashion 20 cc of half percent Marcaine was injected into the fascia and paraspinous musculature. Then the subcutaneous tissue was closed with 2-0 Vicryl and 3-0 Vicryl is used to close subcuticular skin blood loss for the procedure was estimated at 100 cc. The patient tolerated the procedure well.

## 2012-03-15 NOTE — Plan of Care (Signed)
Problem: Consults Goal: Diagnosis - Spinal Surgery Outcome: Completed/Met Date Met:  03/15/12 LUMBAR STENOSIS

## 2012-03-15 NOTE — Plan of Care (Signed)
Problem: Consults Goal: Diagnosis - Spinal Surgery Outcome: Completed/Met Date Met:  03/15/12 Lumbar Laminectomy (Complex)

## 2012-03-15 NOTE — Transfer of Care (Signed)
Immediate Anesthesia Transfer of Care Note  Patient: Carlos Hanson  Procedure(s) Performed: Procedure(s) (LRB) with comments: LUMBAR LAMINECTOMY WITH  X-STOP 2 LEVEL (N/A) - Lumbar two-three, three-four XSTOP  Patient Location: PACU  Anesthesia Type:General  Level of Consciousness: awake  Airway & Oxygen Therapy: Patient Spontanous Breathing and Patient connected to nasal cannula oxygen  Post-op Assessment: Report given to PACU RN, Post -op Vital signs reviewed and stable and Patient moving all extremities  Post vital signs: Reviewed and stable  Complications: No apparent anesthesia complications

## 2012-03-15 NOTE — Discharge Summary (Signed)
Physician Discharge Summary  Patient ID: Carlos Hanson MRN: [id number] DOB/AGE: 1940-11-27 71 y.o.  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 *    Discharged Condition: good  Hospital Course: Patient was admitted to undergo exploratory laparoscopy placement at L2-3 L3-4 he tolerated procedure well he is ambulatory voiding and tolerating minimal pain medication.  Consults: None  Significant Diagnostic Studies:  MRI lumbar spine  Treatments: surgery: Placement of  X. stop L2-3 L3-4  Discharge Exam: Blood pressure 109/73, pulse 70, temperature 98.5 F (36.9 C), temperature source Oral, resp. rate 18, height 6' 0.05" (1.83 m), weight 149.3 kg (329 lb 2.4 oz), SpO2 95.00%. Incision clean and dry motor function intact in lower extremity station and gait normal  Disposition: 01-Home or Self Care  Discharge Orders    Future Orders Please Complete By Expires   Diet - low sodium heart healthy      Increase activity slowly      Discharge instructions      Comments:   Okay to shower. Do not apply salves or appointments to incision. No heavy lifting with the upper extremities greater than 15 pounds. May resume driving when not requiring pain medication and patient feels comfortable with doing so.   Call MD for:  redness, tenderness, or signs of infection (pain, swelling, redness, odor or green/yellow discharge around incision site)      Call MD for:  severe uncontrolled pain      Call MD for:  temperature >100.4          Medication List     As of 03/15/2012  5:29 PM    TAKE these medications         allopurinol 300 MG tablet   Commonly known as: ZYLOPRIM   Take 300 mg by mouth daily.      amiodarone 200 MG tablet   Commonly known as: PACERONE   Take 200 mg  by mouth daily.      buPROPion 100 MG 12 hr tablet   Commonly known as: WELLBUTRIN SR   Take 100 mg by mouth daily.      dabigatran 150 MG Caps   Commonly known as: PRADAXA   Take 150 mg by mouth every 12 (twelve) hours.      diazepam 5 MG tablet   Commonly known as: VALIUM   Take 1 tablet (5 mg total) by mouth every 6 (six) hours as needed.      diltiazem 180 MG 24 hr capsule   Commonly known as: DILACOR XR   Take 180 mg by mouth daily.      furosemide 20 MG tablet   Commonly known as: LASIX   Take 20 mg by mouth daily.      losartan 100 MG tablet   Commonly known as: COZAAR   Take 100 mg by mouth daily.      morphine 30 MG tablet   Commonly known as: MSIR   Take 30 mg by mouth daily with breakfast.      nortriptyline 25 MG capsule   Commonly known as: PAMELOR   Take 25 mg by mouth at bedtime.      omega-3 acid ethyl esters 1 G capsule   Commonly known as: LOVAZA   Take 2 g by mouth 2 (two) times daily.  oxyCODONE-acetaminophen 5-325 MG per tablet   Commonly known as: PERCOCET/ROXICET   Take 1-2 tablets by mouth every 4 (four) hours as needed for pain.      pantoprazole 40 MG tablet   Commonly known as: PROTONIX   Take 40 mg by mouth 2 (two) times daily.      rosuvastatin 10 MG tablet   Commonly known as: CRESTOR   Take 10 mg by mouth daily.      triamterene-hydrochlorothiazide 37.5-25 MG per tablet   Commonly known as: MAXZIDE-25   Take 0.5 tablets by mouth daily.      zolpidem 10 MG tablet   Commonly known as: AMBIEN   Take 10 mg by mouth at bedtime as needed. For insomnia.         SignedStefani Dama 03/15/2012, 5:29 PM

## 2012-03-15 NOTE — Progress Notes (Signed)
Utilization review completed. Jacquelyn Antony, RN, BSN. 

## 2012-03-15 NOTE — Anesthesia Postprocedure Evaluation (Signed)
  Anesthesia Post-op Note  Patient: Carlos Hanson  Procedure(s) Performed: Procedure(s) (LRB) with comments: LUMBAR LAMINECTOMY WITH  X-STOP 2 LEVEL (N/A) - Lumbar two-three, three-four XSTOP  Patient Location: PACU  Anesthesia Type:General  Level of Consciousness: awake, alert , oriented and patient cooperative  Airway and Oxygen Therapy: Patient Spontanous Breathing and Patient connected to nasal cannula oxygen  Post-op Pain: mild  Post-op Assessment: Post-op Vital signs reviewed, Patient's Cardiovascular Status Stable, Respiratory Function Stable, Patent Airway, No signs of Nausea or vomiting and Pain level controlled  Post-op Vital Signs: stable  Complications: No apparent anesthesia complications

## 2012-03-17 ENCOUNTER — Encounter (HOSPITAL_COMMUNITY): Payer: Self-pay | Admitting: Neurological Surgery

## 2012-05-18 ENCOUNTER — Ambulatory Visit: Payer: Medicare Other | Admitting: Physical Therapy

## 2012-06-18 ENCOUNTER — Other Ambulatory Visit: Payer: Self-pay

## 2012-06-24 ENCOUNTER — Ambulatory Visit (INDEPENDENT_AMBULATORY_CARE_PROVIDER_SITE_OTHER): Payer: Medicare Other | Admitting: Urology

## 2012-06-24 DIAGNOSIS — N401 Enlarged prostate with lower urinary tract symptoms: Secondary | ICD-10-CM

## 2012-06-24 DIAGNOSIS — R3913 Splitting of urinary stream: Secondary | ICD-10-CM

## 2012-06-24 DIAGNOSIS — R351 Nocturia: Secondary | ICD-10-CM

## 2012-07-22 ENCOUNTER — Ambulatory Visit: Payer: Medicare Other | Admitting: Urology

## 2012-12-07 ENCOUNTER — Other Ambulatory Visit: Payer: Self-pay

## 2013-02-24 ENCOUNTER — Ambulatory Visit
Admission: RE | Admit: 2013-02-24 | Discharge: 2013-02-24 | Disposition: A | Discharge status: Home / Self Care | Payer: Medicare Other | Source: Ambulatory Visit | Attending: Cardiology | Admitting: Cardiology

## 2013-02-24 ENCOUNTER — Other Ambulatory Visit: Payer: Self-pay | Admitting: Cardiology

## 2013-02-24 DIAGNOSIS — Z9229 Personal history of other drug therapy: Secondary | ICD-10-CM

## 2013-03-09 ENCOUNTER — Other Ambulatory Visit: Payer: Self-pay

## 2014-01-13 IMAGING — CR DG CHEST 2V
3 series · 3 of 3 positions shown · non-contrast
Comparison: 09/02/2010

CLINICAL DATA: Preop for lumbar laminectomy

CHEST - 2 VIEW

[view not recorded (1 of 3)]
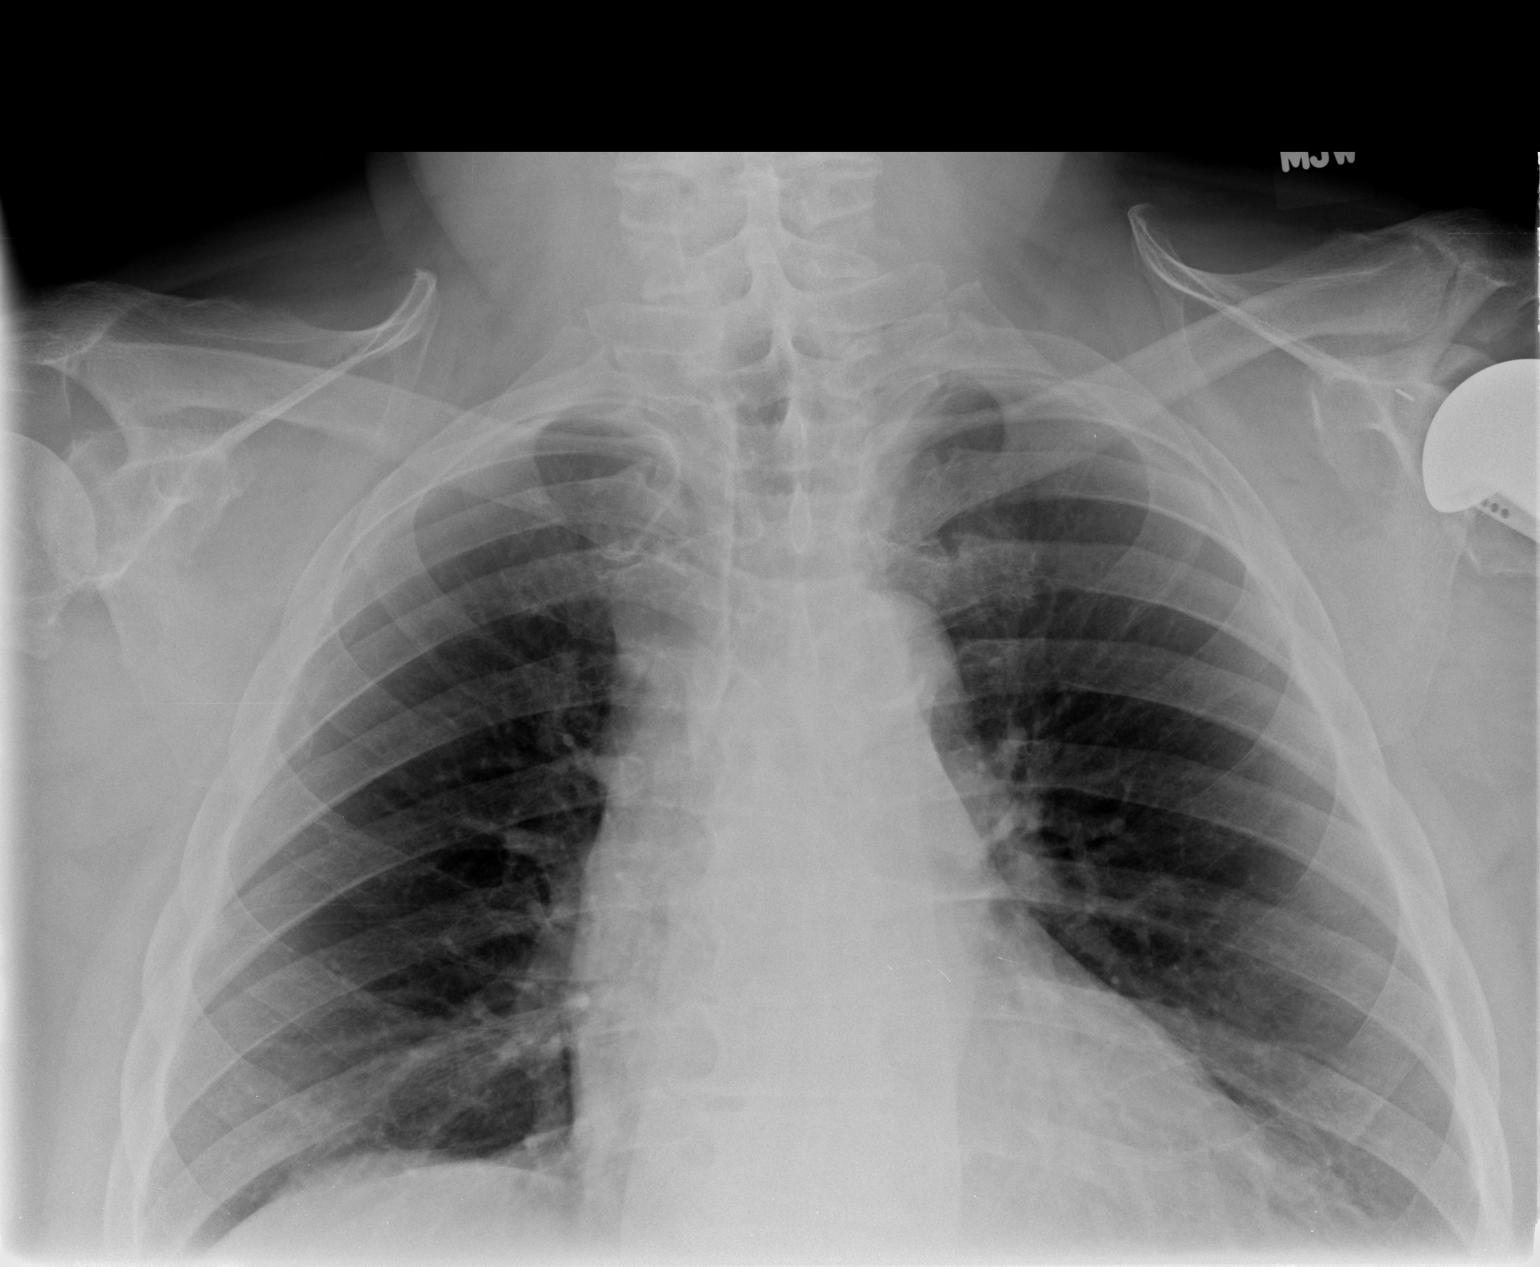

[view not recorded (2 of 3)]
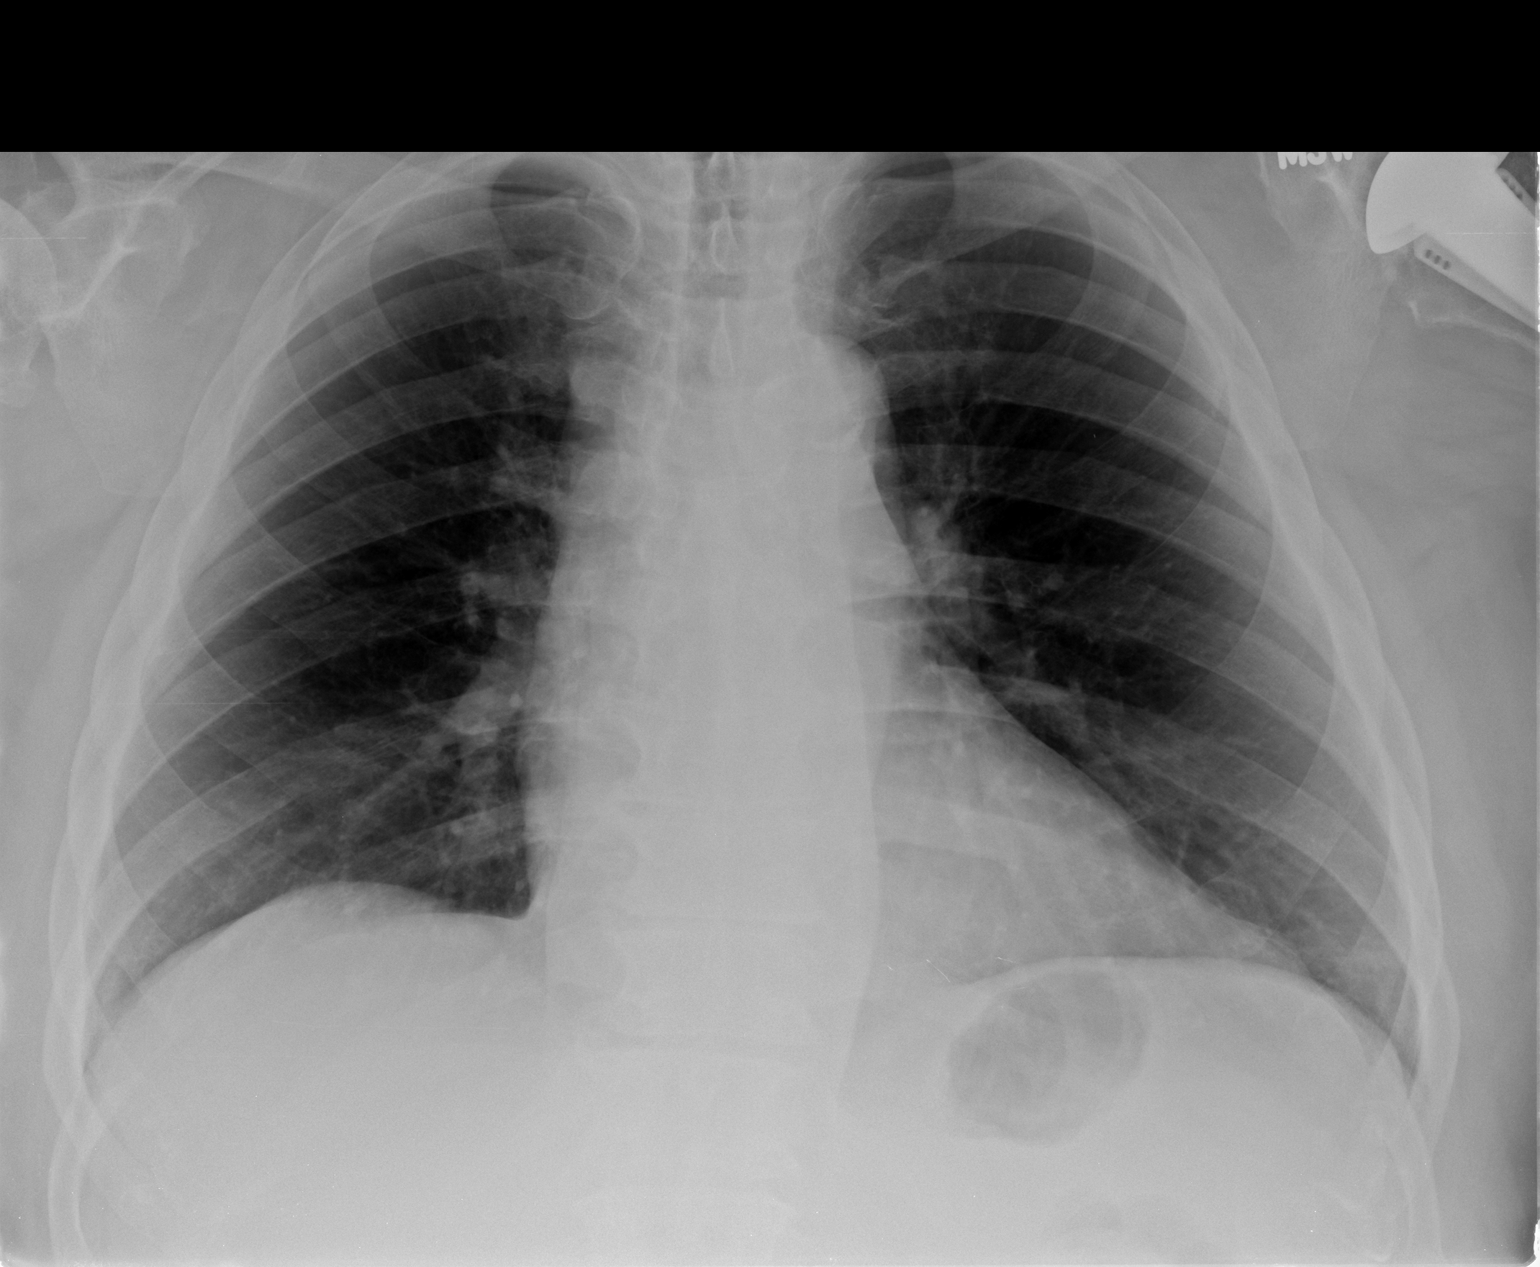

[view not recorded (3 of 3)]
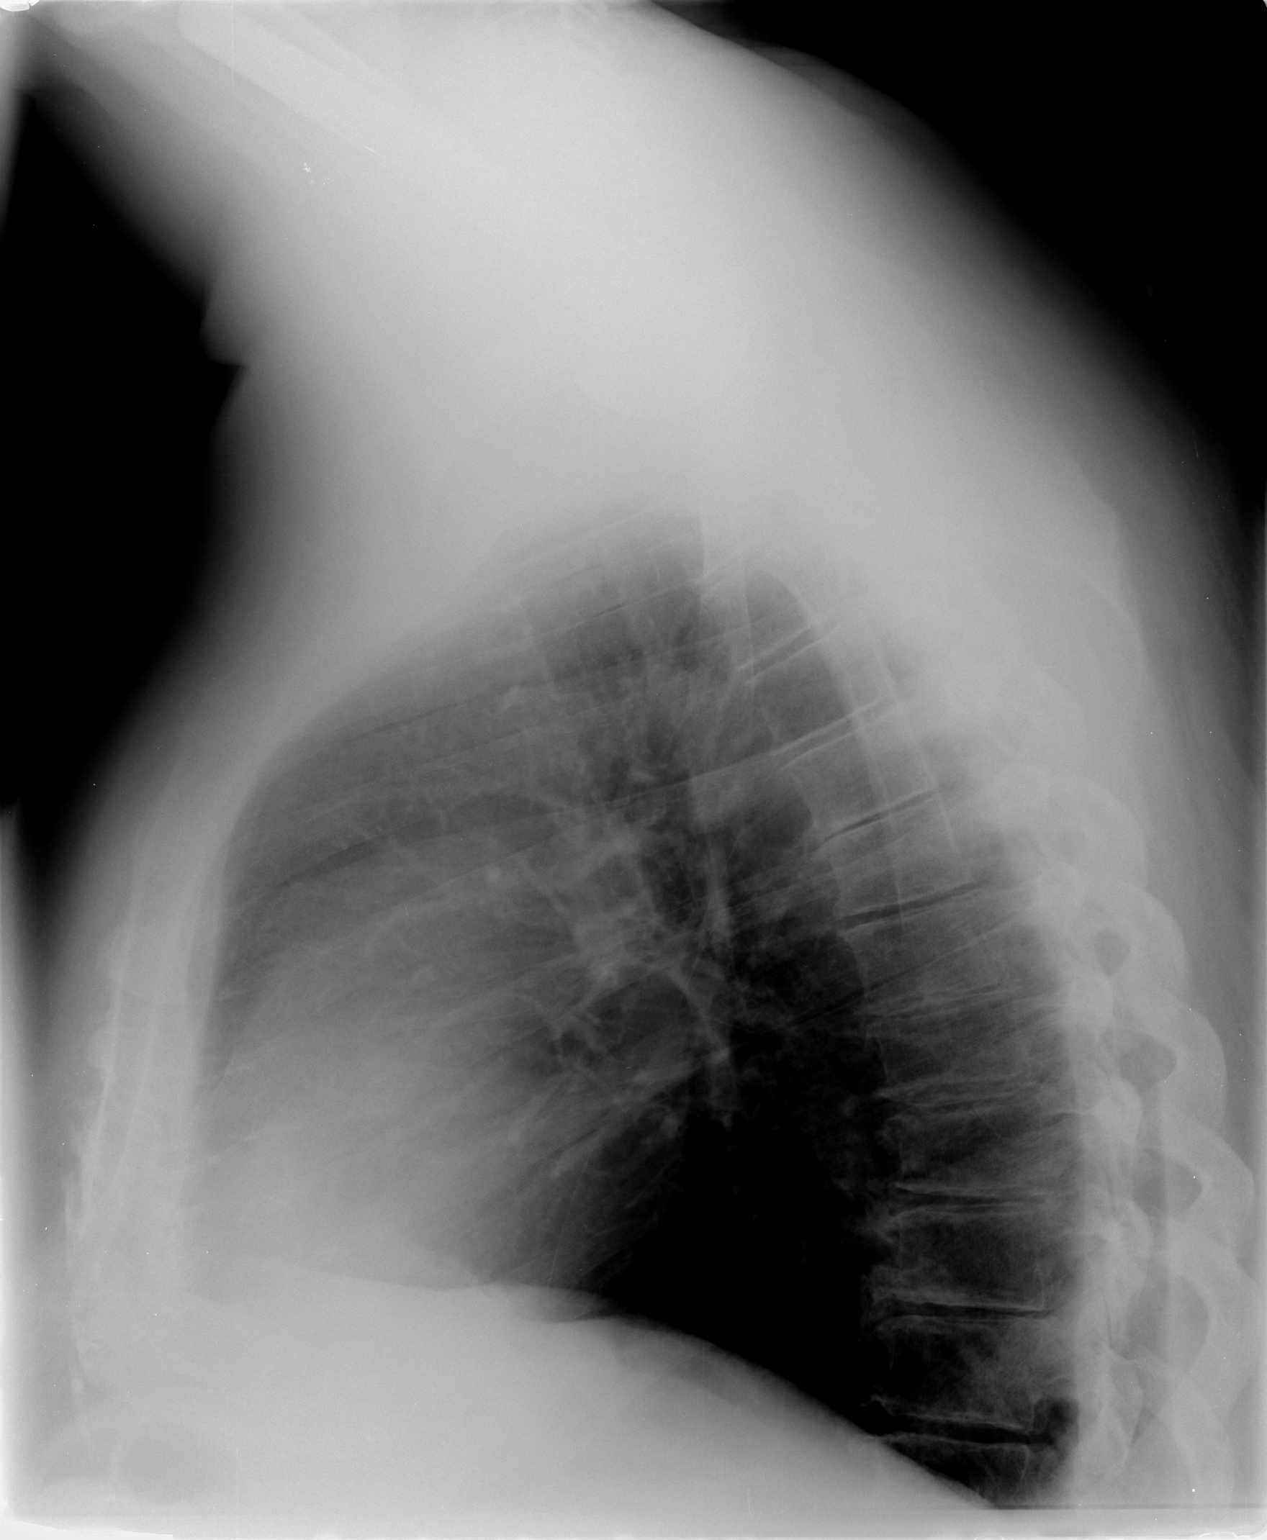

[3 of 3 positions shown; findings below may reference images not displayed]

FINDINGS: Cardiomediastinal silhouette is stable.  No acute
infiltrate or pleural effusion.  Mild degenerative changes thoracic
spine.  Left shoulder prosthesis.
IMPRESSION: No active disease.  Mild degenerative changes thoracic spine.

## 2014-06-29 ENCOUNTER — Encounter (HOSPITAL_COMMUNITY): Payer: Self-pay

## 2014-06-29 ENCOUNTER — Emergency Department (HOSPITAL_COMMUNITY): Payer: Medicare HMO

## 2014-06-29 ENCOUNTER — Inpatient Hospital Stay (HOSPITAL_COMMUNITY)
Admission: EM | Admit: 2014-06-29 | Discharge: 2014-07-01 | DRG: 690 | Disposition: A | Discharge status: Home / Self Care | Payer: Medicare HMO | Attending: Internal Medicine | Admitting: Internal Medicine

## 2014-06-29 DIAGNOSIS — Z79899 Other long term (current) drug therapy: Secondary | ICD-10-CM | POA: Diagnosis not present

## 2014-06-29 DIAGNOSIS — E780 Pure hypercholesterolemia: Secondary | ICD-10-CM | POA: Diagnosis present

## 2014-06-29 DIAGNOSIS — Z72820 Sleep deprivation: Secondary | ICD-10-CM

## 2014-06-29 DIAGNOSIS — Z87440 Personal history of urinary (tract) infections: Secondary | ICD-10-CM

## 2014-06-29 DIAGNOSIS — Z87891 Personal history of nicotine dependence: Secondary | ICD-10-CM | POA: Diagnosis not present

## 2014-06-29 DIAGNOSIS — Z79891 Long term (current) use of opiate analgesic: Secondary | ICD-10-CM

## 2014-06-29 DIAGNOSIS — I4891 Unspecified atrial fibrillation: Secondary | ICD-10-CM | POA: Diagnosis present

## 2014-06-29 DIAGNOSIS — G2581 Restless legs syndrome: Secondary | ICD-10-CM | POA: Diagnosis present

## 2014-06-29 DIAGNOSIS — N390 Urinary tract infection, site not specified: Principal | ICD-10-CM | POA: Diagnosis present

## 2014-06-29 DIAGNOSIS — M1990 Unspecified osteoarthritis, unspecified site: Secondary | ICD-10-CM | POA: Diagnosis present

## 2014-06-29 DIAGNOSIS — Z96612 Presence of left artificial shoulder joint: Secondary | ICD-10-CM | POA: Diagnosis present

## 2014-06-29 DIAGNOSIS — N289 Disorder of kidney and ureter, unspecified: Secondary | ICD-10-CM

## 2014-06-29 DIAGNOSIS — I2510 Atherosclerotic heart disease of native coronary artery without angina pectoris: Secondary | ICD-10-CM | POA: Diagnosis present

## 2014-06-29 DIAGNOSIS — G8929 Other chronic pain: Secondary | ICD-10-CM | POA: Diagnosis present

## 2014-06-29 DIAGNOSIS — G4733 Obstructive sleep apnea (adult) (pediatric): Secondary | ICD-10-CM | POA: Diagnosis present

## 2014-06-29 DIAGNOSIS — Z888 Allergy status to other drugs, medicaments and biological substances status: Secondary | ICD-10-CM

## 2014-06-29 DIAGNOSIS — M109 Gout, unspecified: Secondary | ICD-10-CM | POA: Diagnosis present

## 2014-06-29 DIAGNOSIS — R4182 Altered mental status, unspecified: Secondary | ICD-10-CM | POA: Diagnosis present

## 2014-06-29 DIAGNOSIS — M545 Low back pain: Secondary | ICD-10-CM | POA: Diagnosis present

## 2014-06-29 DIAGNOSIS — R410 Disorientation, unspecified: Secondary | ICD-10-CM | POA: Diagnosis present

## 2014-06-29 DIAGNOSIS — Z86010 Personal history of colonic polyps: Secondary | ICD-10-CM

## 2014-06-29 DIAGNOSIS — I10 Essential (primary) hypertension: Secondary | ICD-10-CM | POA: Diagnosis present

## 2014-06-29 DIAGNOSIS — Z792 Long term (current) use of antibiotics: Secondary | ICD-10-CM | POA: Diagnosis not present

## 2014-06-29 HISTORY — DX: Obstructive sleep apnea (adult) (pediatric): G47.33

## 2014-06-29 LAB — COMPREHENSIVE METABOLIC PANEL
ALK PHOS: 62 U/L (ref 39–117)
ALT: 30 U/L (ref 0–53)
AST: 34 U/L (ref 0–37)
Albumin: 3.5 g/dL (ref 3.5–5.2)
Anion gap: 13 (ref 5–15)
BUN: 33 mg/dL — ABNORMAL HIGH (ref 6–23)
CHLORIDE: 99 mmol/L (ref 96–112)
CO2: 24 mmol/L (ref 19–32)
CREATININE: 1.71 mg/dL — AB (ref 0.50–1.35)
Calcium: 9.3 mg/dL (ref 8.4–10.5)
GFR calc Af Amer: 43 mL/min — ABNORMAL LOW (ref >90)
GFR calc non Af Amer: 37 mL/min — ABNORMAL LOW (ref >90)
Glucose, Bld: 106 mg/dL — ABNORMAL HIGH (ref 70–99)
POTASSIUM: 4.1 mmol/L (ref 3.5–5.1)
Sodium: 136 mmol/L (ref 135–145)
TOTAL PROTEIN: 7.3 g/dL (ref 6.0–8.3)
Total Bilirubin: 0.5 mg/dL (ref 0.3–1.2)

## 2014-06-29 LAB — URINALYSIS, ROUTINE W REFLEX MICROSCOPIC
GLUCOSE, UA: NEGATIVE mg/dL
Ketones, ur: NEGATIVE mg/dL
Nitrite: NEGATIVE
PH: 5.5 (ref 5.0–8.0)
Protein, ur: 30 mg/dL — AB
Specific Gravity, Urine: 1.024 (ref 1.005–1.030)
Urobilinogen, UA: 1 mg/dL (ref 0.0–1.0)

## 2014-06-29 LAB — RAPID URINE DRUG SCREEN, HOSP PERFORMED
AMPHETAMINES: NOT DETECTED
BARBITURATES: NOT DETECTED
Benzodiazepines: NOT DETECTED
Cocaine: NOT DETECTED
OPIATES: POSITIVE — AB
TETRAHYDROCANNABINOL: NOT DETECTED

## 2014-06-29 LAB — CBC WITH DIFFERENTIAL/PLATELET
Basophils Absolute: 0 10*3/uL (ref 0.0–0.1)
Basophils Relative: 0 % (ref 0–1)
EOS ABS: 0.6 10*3/uL (ref 0.0–0.7)
EOS PCT: 6 % — AB (ref 0–5)
HCT: 40.5 % (ref 39.0–52.0)
Hemoglobin: 13.9 g/dL (ref 13.0–17.0)
LYMPHS ABS: 1.7 10*3/uL (ref 0.7–4.0)
Lymphocytes Relative: 17 % (ref 12–46)
MCH: 32.2 pg (ref 26.0–34.0)
MCHC: 34.3 g/dL (ref 30.0–36.0)
MCV: 93.8 fL (ref 78.0–100.0)
Monocytes Absolute: 1.1 10*3/uL — ABNORMAL HIGH (ref 0.1–1.0)
Monocytes Relative: 11 % (ref 3–12)
Neutro Abs: 6.4 10*3/uL (ref 1.7–7.7)
Neutrophils Relative %: 66 % (ref 43–77)
PLATELETS: 304 10*3/uL (ref 150–400)
RBC: 4.32 MIL/uL (ref 4.22–5.81)
RDW: 12.9 % (ref 11.5–15.5)
WBC: 9.8 10*3/uL (ref 4.0–10.5)

## 2014-06-29 LAB — CBG MONITORING, ED: GLUCOSE-CAPILLARY: 103 mg/dL — AB (ref 70–99)

## 2014-06-29 LAB — CBC
HEMATOCRIT: 39.6 % (ref 39.0–52.0)
Hemoglobin: 13.6 g/dL (ref 13.0–17.0)
MCH: 32.5 pg (ref 26.0–34.0)
MCHC: 34.3 g/dL (ref 30.0–36.0)
MCV: 94.7 fL (ref 78.0–100.0)
Platelets: 287 10*3/uL (ref 150–400)
RBC: 4.18 MIL/uL — ABNORMAL LOW (ref 4.22–5.81)
RDW: 13.1 % (ref 11.5–15.5)
WBC: 10.2 10*3/uL (ref 4.0–10.5)

## 2014-06-29 LAB — ETHANOL: Alcohol, Ethyl (B): <5 mg/dL (ref 0–9)

## 2014-06-29 LAB — CREATININE, SERUM
Creatinine, Ser: 1.6 mg/dL — ABNORMAL HIGH (ref 0.50–1.35)
GFR calc Af Amer: 47 mL/min — ABNORMAL LOW (ref >90)
GFR calc non Af Amer: 41 mL/min — ABNORMAL LOW (ref >90)

## 2014-06-29 LAB — URINE MICROSCOPIC-ADD ON

## 2014-06-29 LAB — LACTIC ACID, PLASMA: LACTIC ACID, VENOUS: 1.7 mmol/L (ref 0.5–2.0)

## 2014-06-29 LAB — TROPONIN I: Troponin I: <0.03 ng/mL (ref <0.031)

## 2014-06-29 MED ORDER — ONDANSETRON HCL 4 MG PO TABS: 4.0000 mg | ORAL_TABLET | Freq: Four times a day (QID) | ORAL | Status: DC | PRN

## 2014-06-29 MED ORDER — SODIUM CHLORIDE 0.9 % IV SOLN: INTRAVENOUS | Status: AC

## 2014-06-29 MED ORDER — DABIGATRAN ETEXILATE MESYLATE 150 MG PO CAPS
150.0000 mg | ORAL_CAPSULE | Freq: Two times a day (BID) | ORAL | Status: DC
  Administered 2014-06-29 – 2014-07-01 (×4): 150 mg via ORAL
  Filled 2014-06-29 (×5): qty 1

## 2014-06-29 MED ORDER — OXYCODONE-ACETAMINOPHEN 5-325 MG PO TABS
2.0000 | ORAL_TABLET | Freq: Once | ORAL | Status: AC
  Administered 2014-06-29: 2 via ORAL
  Filled 2014-06-29: qty 2

## 2014-06-29 MED ORDER — OXYMETAZOLINE HCL 0.05 % NA SOLN: 1.0000 | Freq: Every evening | NASAL | Status: DC | PRN

## 2014-06-29 MED ORDER — PANTOPRAZOLE SODIUM 40 MG PO TBEC
40.0000 mg | DELAYED_RELEASE_TABLET | Freq: Two times a day (BID) | ORAL | Status: DC
  Administered 2014-06-29 – 2014-07-01 (×4): 40 mg via ORAL
  Filled 2014-06-29 (×5): qty 1

## 2014-06-29 MED ORDER — OXYCODONE-ACETAMINOPHEN 5-325 MG PO TABS
1.0000 | ORAL_TABLET | ORAL | Status: DC | PRN
  Administered 2014-06-29: 1 via ORAL
  Filled 2014-06-29: qty 1

## 2014-06-29 MED ORDER — DILTIAZEM HCL ER 180 MG PO CP24
180.0000 mg | ORAL_CAPSULE | Freq: Every day | ORAL | Status: DC
  Administered 2014-06-29 – 2014-07-01 (×3): 180 mg via ORAL
  Filled 2014-06-29 (×3): qty 1

## 2014-06-29 MED ORDER — MORPHINE SULFATE 2 MG/ML IJ SOLN
2.0000 mg | INTRAMUSCULAR | Status: DC | PRN
  Administered 2014-06-29: 2 mg via INTRAVENOUS
  Filled 2014-06-29: qty 1

## 2014-06-29 MED ORDER — POTASSIUM CHLORIDE IN NACL 20-0.9 MEQ/L-% IV SOLN
INTRAVENOUS | Status: DC
  Administered 2014-06-29 – 2014-06-30 (×3): via INTRAVENOUS
  Filled 2014-06-29 (×4): qty 1000

## 2014-06-29 MED ORDER — ONDANSETRON HCL 4 MG/2ML IJ SOLN: 4.0000 mg | Freq: Four times a day (QID) | INTRAMUSCULAR | Status: DC | PRN

## 2014-06-29 MED ORDER — ACETAMINOPHEN 650 MG RE SUPP: 650.0000 mg | Freq: Four times a day (QID) | RECTAL | Status: DC | PRN

## 2014-06-29 MED ORDER — AMIODARONE HCL 100 MG PO TABS
100.0000 mg | ORAL_TABLET | Freq: Every day | ORAL | Status: DC
  Administered 2014-06-29 – 2014-07-01 (×3): 100 mg via ORAL
  Filled 2014-06-29 (×3): qty 1

## 2014-06-29 MED ORDER — DULOXETINE HCL 60 MG PO CPEP
60.0000 mg | ORAL_CAPSULE | Freq: Every day | ORAL | Status: DC
  Administered 2014-06-29 – 2014-06-30 (×2): 60 mg via ORAL
  Filled 2014-06-29 (×3): qty 1

## 2014-06-29 MED ORDER — OXYCODONE-ACETAMINOPHEN 10-325 MG PO TABS: 1.0000 | ORAL_TABLET | ORAL | Status: DC | PRN

## 2014-06-29 MED ORDER — SODIUM CHLORIDE 0.9 % IV BOLUS (SEPSIS)
250.0000 mL | Freq: Once | INTRAVENOUS | Status: AC
  Administered 2014-06-29: 250 mL via INTRAVENOUS

## 2014-06-29 MED ORDER — ALUM & MAG HYDROXIDE-SIMETH 200-200-20 MG/5ML PO SUSP: 30.0000 mL | Freq: Four times a day (QID) | ORAL | Status: DC | PRN

## 2014-06-29 MED ORDER — SENNOSIDES-DOCUSATE SODIUM 8.6-50 MG PO TABS: 1.0000 | ORAL_TABLET | Freq: Every evening | ORAL | Status: DC | PRN

## 2014-06-29 MED ORDER — ALLOPURINOL 300 MG PO TABS
300.0000 mg | ORAL_TABLET | Freq: Every day | ORAL | Status: DC
  Administered 2014-06-29 – 2014-07-01 (×3): 300 mg via ORAL
  Filled 2014-06-29 (×3): qty 1

## 2014-06-29 MED ORDER — MAGNESIUM CITRATE PO SOLN: 1.0000 | Freq: Once | ORAL | Status: AC | PRN

## 2014-06-29 MED ORDER — ACETAMINOPHEN 325 MG PO TABS: 650.0000 mg | ORAL_TABLET | Freq: Four times a day (QID) | ORAL | Status: DC | PRN

## 2014-06-29 MED ORDER — SULFAMETHOXAZOLE-TRIMETHOPRIM 800-160 MG PO TABS
1.0000 | ORAL_TABLET | Freq: Two times a day (BID) | ORAL | Status: DC
  Administered 2014-06-29 – 2014-07-01 (×4): 1 via ORAL
  Filled 2014-06-29 (×5): qty 1

## 2014-06-29 MED ORDER — ROSUVASTATIN CALCIUM 10 MG PO TABS
10.0000 mg | ORAL_TABLET | Freq: Every day | ORAL | Status: DC
  Administered 2014-06-29 – 2014-07-01 (×3): 10 mg via ORAL
  Filled 2014-06-29 (×3): qty 1

## 2014-06-29 MED ORDER — SODIUM CHLORIDE 0.9 % IV SOLN
INTRAVENOUS | Status: DC
  Administered 2014-06-29: 10:00:00 via INTRAVENOUS

## 2014-06-29 MED ORDER — CEFTRIAXONE SODIUM 1 G IJ SOLR
1.0000 g | Freq: Once | INTRAMUSCULAR | Status: AC
  Administered 2014-06-29: 1 g via INTRAVENOUS
  Filled 2014-06-29: qty 10

## 2014-06-29 MED ORDER — OXYCODONE HCL 5 MG PO TABS
5.0000 mg | ORAL_TABLET | ORAL | Status: DC | PRN
  Administered 2014-06-30: 5 mg via ORAL
  Filled 2014-06-29: qty 1

## 2014-06-29 NOTE — ED Notes (Signed)
[name] Uher/wife  [phone]

## 2014-06-29 NOTE — ED Notes (Signed)
Attempted to call report. Receiving nurse unavailable and room needed to be "zapped."

## 2014-06-29 NOTE — ED Notes (Signed)
Bed: RESB Expected date:  Expected time:  Means of arrival:  Comments: EMS-AMS-bladder infection

## 2014-06-29 NOTE — ED Notes (Signed)
Attempted the in and out cath and received very little urine return. Not enough to test.

## 2014-06-29 NOTE — ED Provider Notes (Signed)
CSN: [id number]     Arrival date & time 06/29/14  0913 History   First MD Initiated Contact with Patient 06/29/14 [phone]     Chief Complaint  Patient presents with  . Altered Mental Status  . Urinary Tract Infection      Patient is a 74 y.o. male presenting with altered mental status and urinary tract infection. The history is provided by the patient and the spouse. The history is limited by the condition of the patient (AMS).  Altered Mental Status Urinary Tract Infection  Pt was seen at 0955. Per pt and his wife: c/o gradual onset and worsening of persistent AMS for the past 3 to 4 days. Pt was having symptoms of a UTI 5 days ago, and was evaluated by his Uro MD at [facility] 4 days ago. Pt was dx with UTI, rx bactrim.  Pt's wife states pt has become progressively "more and more confused" over the past 3 to 4 days. Pt has been taking his antibiotic as prescribed. Pt's wife states pt "hasn't slept in 4 days" and has not used his CPAP in "at least 6 months." Denies falls, no fevers, no abd pain, no N/V/D, no back pain, no CP/SOB.    Past Medical History  Diagnosis Date  . Hypertension   . Gout   . Hypercholesteremia   . History of ulcer disease   . Chronic back pain   . Dysrhythmia     History of atrial fibrillation  . Sleep apnea     cpap sleep study 2011  . Arthritis   . H/O hiatal hernia   . History of bladder infections   . Nocturia   . History of bleeding ulcers   . Hearing decreased     right ear  . Coronary artery disease     40% mLAD/DIAG bifurcation 03/25/10    Past Surgical History  Procedure Laterality Date  . Esophagogastroduodenoscopy    . Left shoulder replacement    . Tonsillectomy    . Back surgery    . Colonoscopy  08/27/2011    Procedure: COLONOSCOPY;  Surgeon: Rogene Houston, MD;  Location: [facility];  Service: Endoscopy;  Laterality: N/A;  930  . Joint replacement      left shoulder  . Cardiac catheterization      2011  . Cardioverson    . Lumbar  laminectomy  03/15/2012    Procedure: LUMBAR LAMINECTOMY WITH  X-STOP 2 LEVEL;  Surgeon: Kristeen Miss, MD;  Location: [facility];  Service: Neurosurgery;  Laterality: N/A;  Lumbar two-three, three-four XSTOP   Family History  Problem Relation Age of Onset  . Colon cancer Neg Hx    History  Substance Use Topics  . Smoking status: Former Smoker -- 1.00 packs/day for 3 years    Types: Cigarettes  . Smokeless tobacco: Current User    Types: Chew  . Alcohol Use: 7.0 oz/week    14 Standard drinks or equivalent per week     Comment: one drink a day    Review of Systems  Unable to perform ROS: Mental status change      Allergies  Naprosyn  Home Medications   Prior to Admission medications   Medication Sig Start Date End Date Taking? Authorizing Provider  allopurinol (ZYLOPRIM) 300 MG tablet Take 300 mg by mouth daily.   Yes Historical Provider, MD  amiodarone (PACERONE) 200 MG tablet Take 100 mg by mouth daily.    Yes Historical Provider, MD  dabigatran (PRADAXA) 150 MG CAPS Take 150 mg by mouth every 12 (twelve) hours.   Yes Historical Provider, MD  diltiazem (DILACOR XR) 180 MG 24 hr capsule Take 180 mg by mouth daily.   Yes Historical Provider, MD  DULoxetine (CYMBALTA) 30 MG capsule Take 60 mg by mouth daily.   Yes Historical Provider, MD  meclizine (ANTIVERT) 25 MG tablet Take 25 mg by mouth 3 (three) times daily as needed for dizziness (Otc).   Yes Historical Provider, MD  morphine (MSIR) 15 MG tablet Take 15 mg by mouth 2 (two) times daily.   Yes Historical Provider, MD  oxyCODONE-acetaminophen (PERCOCET) 10-325 MG per tablet Take 1 tablet by mouth 3 (three) times daily as needed for pain.   Yes Historical Provider, MD  oxymetazoline (AFRIN) 0.05 % nasal spray Place 1 spray into both nostrils at bedtime as needed for congestion (sinuses).   Yes Historical Provider, MD  pantoprazole (PROTONIX) 40 MG tablet Take 40 mg by mouth 2 (two) times daily.   Yes Historical Provider, MD   rosuvastatin (CRESTOR) 10 MG tablet Take 10 mg by mouth daily.   Yes Historical Provider, MD  sulfamethoxazole-trimethoprim (BACTRIM DS,SEPTRA DS) 800-160 MG per tablet Take 1 tablet by mouth 2 (two) times daily.   Yes Historical Provider, MD  triamterene-hydrochlorothiazide (MAXZIDE-25) 37.5-25 MG per tablet Take 0.5 tablets by mouth daily.   Yes Historical Provider, MD  zolpidem (AMBIEN) 10 MG tablet Take 10 mg by mouth at bedtime as needed. For insomnia.   Yes Historical Provider, MD  diazepam (VALIUM) 5 MG tablet Take 1 tablet (5 mg total) by mouth every 6 (six) hours as needed. Patient not taking: Reported on 06/29/2014 03/15/12   Kristeen Miss, MD  oxyCODONE-acetaminophen (PERCOCET/ROXICET) 5-325 MG per tablet Take 1-2 tablets by mouth every 4 (four) hours as needed for pain. Patient not taking: Reported on 06/29/2014 03/15/12   Kristeen Miss, MD   BP 126/75 mmHg  Pulse 90  Temp(Src) 99.4 F (37.4 C) (Rectal)  Resp 19  SpO2 91% Physical Exam  1000; Physical examination:  Nursing notes reviewed; Vital signs and O2 SAT reviewed;  Constitutional: Well developed, Well nourished, In no acute distress; Head:  Normocephalic, atraumatic; Eyes: EOMI, PERRL, No scleral icterus; ENMT: Mouth and pharynx normal, Mucous membranes dry; Neck: Supple, Full range of motion, No lymphadenopathy; Cardiovascular: Regular rate and rhythm, No gallop; Respiratory: Breath sounds clear & equal bilaterally, No rales, rhonchi, wheezes.  Speaking full sentences with ease, Normal respiratory effort/excursion; Chest: Nontender, Movement normal; Abdomen: Soft, Nontender, Nondistended, Normal bowel sounds; Genitourinary: No CVA tenderness; Extremities: Pulses normal, No tenderness, No edema, No calf edema or asymmetry.; Neuro: Awake, alert, confused re: events. Major CN grossly intact. No facial droop. Speech clear. No gross focal motor or sensory deficits in extremities.; Skin: Color normal, Warm, Dry.   ED Course   Procedures     EKG Interpretation   Date/Time:  Friday June 29 2014 09:17:15 EST Ventricular Rate:  95 PR Interval:  208 QRS Duration: 145 QT Interval:  394 QTC Calculation: 495 R Axis:   -78 Text Interpretation:  Sinus rhythm with 1st degree A-V block Atrial  premature complex Left axis deviation RBBB and LAFB Baseline wander When  compared with ECG of 03/03/2012 No significant change was found Confirmed  by [facility]  MD, Nunzio Cory ([id number]) on 06/29/2014 10:09:24 AM      MDM  MDM Reviewed: previous chart, nursing note and vitals Reviewed previous: labs and ECG Interpretation: labs, ECG, x-ray  and CT scan      Results for orders placed or performed during the hospital encounter of 06/29/14  Urinalysis, Routine w reflex microscopic  Result Value Ref Range   Color, Urine GREEN (A) YELLOW   APPearance CLEAR CLEAR   Specific Gravity, Urine 1.024 1.005 - 1.030   pH 5.5 5.0 - 8.0   Glucose, UA NEGATIVE NEGATIVE mg/dL   Hgb urine dipstick SMALL (A) NEGATIVE   Bilirubin Urine SMALL (A) NEGATIVE   Ketones, ur NEGATIVE NEGATIVE mg/dL   Protein, ur 30 (A) NEGATIVE mg/dL   Urobilinogen, UA 1.0 0.0 - 1.0 mg/dL   Nitrite NEGATIVE NEGATIVE   Leukocytes, UA SMALL (A) NEGATIVE  CBC with Differential/Platelet  Result Value Ref Range   WBC 9.8 4.0 - 10.5 K/uL   RBC 4.32 4.22 - 5.81 MIL/uL   Hemoglobin 13.9 13.0 - 17.0 g/dL   HCT 40.5 39.0 - 52.0 %   MCV 93.8 78.0 - 100.0 fL   MCH 32.2 26.0 - 34.0 pg   MCHC 34.3 30.0 - 36.0 g/dL   RDW 12.9 11.5 - 15.5 %   Platelets 304 150 - 400 K/uL   Neutrophils Relative % 66 43 - 77 %   Neutro Abs 6.4 1.7 - 7.7 K/uL   Lymphocytes Relative 17 12 - 46 %   Lymphs Abs 1.7 0.7 - 4.0 K/uL   Monocytes Relative 11 3 - 12 %   Monocytes Absolute 1.1 (H) 0.1 - 1.0 K/uL   Eosinophils Relative 6 (H) 0 - 5 %   Eosinophils Absolute 0.6 0.0 - 0.7 K/uL   Basophils Relative 0 0 - 1 %   Basophils Absolute 0.0 0.0 - 0.1 K/uL  Lactic acid, plasma   Result Value Ref Range   Lactic Acid, Venous 1.7 0.5 - 2.0 mmol/L  Troponin I  Result Value Ref Range   Troponin I <0.03 <0.031 ng/mL  Ethanol  Result Value Ref Range   Alcohol, Ethyl (B) <5 0 - 9 mg/dL  Urine rapid drug screen (hosp performed)  Result Value Ref Range   Opiates POSITIVE (A) NONE DETECTED   Cocaine NONE DETECTED NONE DETECTED   Benzodiazepines NONE DETECTED NONE DETECTED   Amphetamines NONE DETECTED NONE DETECTED   Tetrahydrocannabinol NONE DETECTED NONE DETECTED   Barbiturates NONE DETECTED NONE DETECTED  Comprehensive metabolic panel  Result Value Ref Range   Sodium 136 135 - 145 mmol/L   Potassium 4.1 3.5 - 5.1 mmol/L   Chloride 99 96 - 112 mmol/L   CO2 24 19 - 32 mmol/L   Glucose, Bld 106 (H) 70 - 99 mg/dL   BUN 33 (H) 6 - 23 mg/dL   Creatinine, Ser 1.71 (H) 0.50 - 1.35 mg/dL   Calcium 9.3 8.4 - 10.5 mg/dL   Total Protein 7.3 6.0 - 8.3 g/dL   Albumin 3.5 3.5 - 5.2 g/dL   AST 34 0 - 37 U/L   ALT 30 0 - 53 U/L   Alkaline Phosphatase 62 39 - 117 U/L   Total Bilirubin 0.5 0.3 - 1.2 mg/dL   GFR calc non Af Amer 37 (L) >90 mL/min   GFR calc Af Amer 43 (L) >90 mL/min   Anion gap 13 5 - 15  Urine microscopic-add on  Result Value Ref Range   WBC, UA 7-10 <3 WBC/hpf   RBC / HPF 3-6 <3 RBC/hpf   Bacteria, UA RARE RARE  CBG monitoring, ED  Result Value Ref Range   Glucose-Capillary 103 (H) 70 -  99 mg/dL   Comment 1 Notify RN    Comment 2 Document in Chart    Dg Chest 2 View 06/29/2014   CLINICAL DATA:  Altered mental status. History of atrial fibrillation  EXAM: CHEST  2 VIEW  COMPARISON:  February 24, 2013  FINDINGS: There is slight scarring in the left lung base. Lungs elsewhere clear. Heart is upper normal in size with pulmonary vascularity within normal limits. No adenopathy. There is mild degenerative change in the thoracic spine. There is a total shoulder replacement on the left.  IMPRESSION: Mild scarring left base.  No edema or consolidation.    Electronically Signed   By: Lowella Grip III M.D.   On: 06/29/2014 10:40   Ct Head Wo Contrast 06/29/2014   CLINICAL DATA:  Altered mental status  EXAM: CT HEAD WITHOUT CONTRAST  TECHNIQUE: Contiguous axial images were obtained from the base of the skull through the vertex without intravenous contrast.  COMPARISON:  None.  FINDINGS: There is moderate diffuse atrophy. There is no intracranial mass, hemorrhage, extra-axial fluid collection, or midline shift. There is patchy small vessel disease in the centra semiovale bilaterally. Elsewhere gray-white compartments appear normal. No acute appearing infarct seen. The bony calvarium appears intact. The mastoid air cells are clear.  IMPRESSION: Moderate atrophy with patchy periventricular small vessel disease. No intracranial mass, hemorrhage, or acute appearing infarct.   Electronically Signed   By: Lowella Grip III M.D.   On: 06/29/2014 10:45    1330:  +UTI, UC pending; will dose IV rocephin. Dx and testing d/w pt and family.  Questions answered.  Verb understanding, agreeable to admit.  T/C to [facility] Dr. Philip Aspen, case discussed, including:  HPI, pertinent PM/SHx, VS/PE, dx testing, ED course and treatment:  Agreeable to admit, requests he will put orders in the computer.   Francine Graven, DO 07/02/14 1122

## 2014-06-29 NOTE — ED Notes (Signed)
Per EMS- Patient's wife reported AMS, dysuria, and urinary frequency at 0200. Patient is currently taking antibiotics for UTI. Patient also has a history of chronic back pain and atrial fib.

## 2014-06-29 NOTE — H&P (Signed)
Carlos Hanson is an 74 y.o. male.   Chief Complaint: disorientation HPI: Carlos Hanson is a 74 yo gentleman with pmhx as below.  He reports having some increased dizziness in the last three weeks. He also reports being started on an antidepressant (cymbalta) 4 weeks ago.  He has also had a  Uti.  He was treated at [facility] urology with an IV abx  (rocephin). and he was on an oral antibiotic  (a sulfa drug)  the last few days. His wife is a Marine scientist and noted that he was up and down all night trying to void and then he seemed more confused. EMS was called .  ER eval was relatively unremarkable  But due to his altered mental status it was felt that he should Be admitted.   He was hallucinating.   He saw bugs all over the place. He saw blood on his wife's robe. He thought the grandchildren were there. He was trembling. He was unbalanced and his wife just could not handle him that way.  Past Medical History  Diagnosis Date  . Hypertension   . Gout   . Hypercholesteremia   . History of ulcer disease   . Chronic back pain   . Dysrhythmia     History of atrial fibrillation  . Sleep apnea     cpap sleep study 2011  . Arthritis   . H/O hiatal hernia   . History of bladder infections   . Nocturia   . History of bleeding ulcers   . Hearing decreased     right ear  . Coronary artery disease     40% mLAD/DIAG bifurcation 03/25/10   . Obstructive sleep apnea hypopnea, severe   Elevated psa Colon polyps Hemorrhoids GI bleed 2008,  H.pylori positive and treated R ankle fx 2008 Obesity   Past Surgical History  Procedure Laterality Date  . Esophagogastroduodenoscopy    . Left shoulder replacement    . Tonsillectomy    . Back surgery    . Colonoscopy  08/27/2011    Procedure: COLONOSCOPY;  Surgeon: Rogene Houston, MD;  Location: [facility];  Service: Endoscopy;  Laterality: N/A;  930  . Joint replacement      left shoulder  . Cardiac catheterization      2011  . Cardioverson    . Lumbar  laminectomy  03/15/2012    Procedure: LUMBAR LAMINECTOMY WITH  X-STOP 2 LEVEL;  Surgeon: Kristeen Miss, MD;  Location: [place] [facility];  Service: Neurosurgery;  Laterality: N/A;  Lumbar two-three, three-four XSTOP    Family History  Problem Relation Age of Onset  . 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 Has 3 sons,  GC  6.   reports that he has quit smoking. His smoking use included Cigarettes. He has a 3 pack-year smoking history. His smokeless tobacco use includes Chew. He reports that he drinks about 7.0 oz of alcohol per week. He reports that he does not use illicit drugs.  Allergies:  Allergies  Allergen Reactions  . Naprosyn [Naproxen] Rash    Medications Prior to Admission  Medication Sig Dispense Refill  . allopurinol (ZYLOPRIM) 300 MG tablet Take 300 mg by mouth daily.    Marland Kitchen amiodarone (PACERONE) 200 MG tablet Take 100 mg by mouth daily.     . dabigatran (PRADAXA) 150 MG CAPS Take 150 mg by mouth every 12 (twelve) hours.    Marland Kitchen diltiazem (DILACOR XR) 180 MG  24 hr capsule Take 180 mg by mouth daily.    . DULoxetine (CYMBALTA) 30 MG capsule Take 60 mg by mouth daily.    . meclizine (ANTIVERT) 25 MG tablet Take 25 mg by mouth 3 (three) times daily as needed for dizziness (Otc).    . morphine (MSIR) 15 MG tablet Take 15 mg by mouth 2 (two) times daily.    Marland Kitchen oxyCODONE-acetaminophen (PERCOCET) 10-325 MG per tablet Take 1 tablet by mouth 3 (three) times daily as needed for pain.    Marland Kitchen oxymetazoline (AFRIN) 0.05 % nasal spray Place 1 spray into both nostrils at bedtime as needed for congestion (sinuses).    . pantoprazole (PROTONIX) 40 MG tablet Take 40 mg by mouth 2 (two) times daily.    . rosuvastatin (CRESTOR) 10 MG tablet Take 10 mg by mouth daily.    Marland Kitchen sulfamethoxazole-trimethoprim (BACTRIM DS,SEPTRA DS) 800-160 MG per tablet Take 1 tablet by mouth 2 (two) times daily.    Marland Kitchen triamterene-hydrochlorothiazide (MAXZIDE-25) 37.5-25 MG per tablet Take 0.5 tablets by  mouth daily.    Marland Kitchen zolpidem (AMBIEN) 10 MG tablet Take 10 mg by mouth at bedtime as needed. For insomnia.    . diazepam (VALIUM) 5 MG tablet Take 1 tablet (5 mg total) by mouth every 6 (six) hours as needed. (Patient not taking: Reported on 06/29/2014) 30 tablet 0  . oxyCODONE-acetaminophen (PERCOCET/ROXICET) 5-325 MG per tablet Take 1-2 tablets by mouth every 4 (four) hours as needed for pain. (Patient not taking: Reported on 06/29/2014) 60 tablet 0    Results for orders placed or performed during the hospital encounter of 06/29/14 (from the past 48 hour(s))  CBG monitoring, ED     Status: Abnormal   Collection Time: 06/29/14  9:19 AM  Result Value Ref Range   Glucose-Capillary 103 (H) 70 - 99 mg/dL   Comment 1 Notify RN    Comment 2 Document in Chart   CBC with Differential/Platelet     Status: Abnormal   Collection Time: 06/29/14  9:55 AM  Result Value Ref Range   WBC 9.8 4.0 - 10.5 K/uL   RBC 4.32 4.22 - 5.81 MIL/uL   Hemoglobin 13.9 13.0 - 17.0 g/dL   HCT 40.5 39.0 - 52.0 %   MCV 93.8 78.0 - 100.0 fL   MCH 32.2 26.0 - 34.0 pg   MCHC 34.3 30.0 - 36.0 g/dL   RDW 12.9 11.5 - 15.5 %   Platelets 304 150 - 400 K/uL   Neutrophils Relative % 66 43 - 77 %   Neutro Abs 6.4 1.7 - 7.7 K/uL   Lymphocytes Relative 17 12 - 46 %   Lymphs Abs 1.7 0.7 - 4.0 K/uL   Monocytes Relative 11 3 - 12 %   Monocytes Absolute 1.1 (H) 0.1 - 1.0 K/uL   Eosinophils Relative 6 (H) 0 - 5 %   Eosinophils Absolute 0.6 0.0 - 0.7 K/uL   Basophils Relative 0 0 - 1 %   Basophils Absolute 0.0 0.0 - 0.1 K/uL  Troponin I     Status: None   Collection Time: 06/29/14  9:55 AM  Result Value Ref Range   Troponin I <0.03 <0.031 ng/mL    Comment:        NO INDICATION OF MYOCARDIAL INJURY.   Lactic acid, plasma     Status: None   Collection Time: 06/29/14  9:56 AM  Result Value Ref Range   Lactic Acid, Venous 1.7 0.5 - 2.0 mmol/L  Ethanol  Status: None   Collection Time: 06/29/14  9:56 AM  Result Value Ref Range    Alcohol, Ethyl (B) <5 0 - 9 mg/dL    Comment:        LOWEST DETECTABLE LIMIT FOR SERUM ALCOHOL IS 11 mg/dL FOR MEDICAL PURPOSES ONLY   Comprehensive metabolic panel     Status: Abnormal   Collection Time: 06/29/14  9:56 AM  Result Value Ref Range   Sodium 136 135 - 145 mmol/L   Potassium 4.1 3.5 - 5.1 mmol/L   Chloride 99 96 - 112 mmol/L   CO2 24 19 - 32 mmol/L   Glucose, Bld 106 (H) 70 - 99 mg/dL   BUN 33 (H) 6 - 23 mg/dL   Creatinine, Ser 1.71 (H) 0.50 - 1.35 mg/dL   Calcium 9.3 8.4 - 10.5 mg/dL   Total Protein 7.3 6.0 - 8.3 g/dL   Albumin 3.5 3.5 - 5.2 g/dL   AST 34 0 - 37 U/L   ALT 30 0 - 53 U/L   Alkaline Phosphatase 62 39 - 117 U/L   Total Bilirubin 0.5 0.3 - 1.2 mg/dL   GFR calc non Af Amer 37 (L) >90 mL/min   GFR calc Af Amer 43 (L) >90 mL/min    Comment: (NOTE) The eGFR has been calculated using the CKD EPI equation. This calculation has not been validated in all clinical situations. eGFR's persistently <90 mL/min signify possible Chronic Kidney Disease.    Anion gap 13 5 - 15  Urinalysis, Routine w reflex microscopic     Status: Abnormal   Collection Time: 06/29/14 11:17 AM  Result Value Ref Range   Color, Urine GREEN (A) YELLOW    Comment: BIOCHEMICALS MAY BE AFFECTED BY COLOR   APPearance CLEAR CLEAR   Specific Gravity, Urine 1.024 1.005 - 1.030   pH 5.5 5.0 - 8.0   Glucose, UA NEGATIVE NEGATIVE mg/dL   Hgb urine dipstick SMALL (A) NEGATIVE   Bilirubin Urine SMALL (A) NEGATIVE   Ketones, ur NEGATIVE NEGATIVE mg/dL   Protein, ur 30 (A) NEGATIVE mg/dL   Urobilinogen, UA 1.0 0.0 - 1.0 mg/dL   Nitrite NEGATIVE NEGATIVE   Leukocytes, UA SMALL (A) NEGATIVE  Urine rapid drug screen (hosp performed)     Status: Abnormal   Collection Time: 06/29/14 11:17 AM  Result Value Ref Range   Opiates POSITIVE (A) NONE DETECTED   Cocaine NONE DETECTED NONE DETECTED   Benzodiazepines NONE DETECTED NONE DETECTED   Amphetamines NONE DETECTED NONE DETECTED    Tetrahydrocannabinol NONE DETECTED NONE DETECTED   Barbiturates NONE DETECTED NONE DETECTED    Comment:        DRUG SCREEN FOR MEDICAL PURPOSES ONLY.  IF CONFIRMATION IS NEEDED FOR ANY PURPOSE, NOTIFY LAB WITHIN 5 DAYS.        LOWEST DETECTABLE LIMITS FOR URINE DRUG SCREEN Drug Class       Cutoff (ng/mL) Amphetamine      1000 Barbiturate      200 Benzodiazepine   128 Tricyclics       786 Opiates          300 Cocaine          300 THC              50   Urine microscopic-add on     Status: None   Collection Time: 06/29/14 11:17 AM  Result Value Ref Range   WBC, UA 7-10 <3 WBC/hpf   RBC / HPF 3-6 <3  RBC/hpf   Bacteria, UA RARE RARE  CBC     Status: Abnormal   Collection Time: 06/29/14  5:08 PM  Result Value Ref Range   WBC 10.2 4.0 - 10.5 K/uL   RBC 4.18 (L) 4.22 - 5.81 MIL/uL   Hemoglobin 13.6 13.0 - 17.0 g/dL   HCT 39.6 39.0 - 52.0 %   MCV 94.7 78.0 - 100.0 fL   MCH 32.5 26.0 - 34.0 pg   MCHC 34.3 30.0 - 36.0 g/dL   RDW 13.1 11.5 - 15.5 %   Platelets 287 150 - 400 K/uL  Creatinine, serum     Status: Abnormal   Collection Time: 06/29/14  5:08 PM  Result Value Ref Range   Creatinine, Ser 1.60 (H) 0.50 - 1.35 mg/dL   GFR calc non Af Amer 41 (L) >90 mL/min   GFR calc Af Amer 47 (L) >90 mL/min    Comment: (NOTE) The eGFR has been calculated using the CKD EPI equation. This calculation has not been validated in all clinical situations. eGFR's persistently <90 mL/min signify possible Chronic Kidney Disease.    Dg Chest 2 View  06/29/2014   CLINICAL DATA:  Altered mental status. History of atrial fibrillation  EXAM: CHEST  2 VIEW  COMPARISON:  February 24, 2013  FINDINGS: There is slight scarring in the left lung base. Lungs elsewhere clear. Heart is upper normal in size with pulmonary vascularity within normal limits. No adenopathy. There is mild degenerative change in the thoracic spine. There is a total shoulder replacement on the left.  IMPRESSION: Mild scarring left base.   No edema or consolidation.   Electronically Signed   By: Lowella Grip III M.D.   On: 06/29/2014 10:40   Ct Head Wo Contrast  06/29/2014   CLINICAL DATA:  Altered mental status  EXAM: CT HEAD WITHOUT CONTRAST  TECHNIQUE: Contiguous axial images were obtained from the base of the skull through the vertex without intravenous contrast.  COMPARISON:  None.  FINDINGS: There is moderate diffuse atrophy. There is no intracranial mass, hemorrhage, extra-axial fluid collection, or midline shift. There is patchy small vessel disease in the centra semiovale bilaterally. Elsewhere gray-white compartments appear normal. No acute appearing infarct seen. The bony calvarium appears intact. The mastoid air cells are clear.  IMPRESSION: Moderate atrophy with patchy periventricular small vessel disease. No intracranial mass, hemorrhage, or acute appearing infarct.   Electronically Signed   By: Lowella Grip III M.D.   On: 06/29/2014 10:45    ROS:as per hpi,  He has not used his cpap in 6 mos.  Blood pressure 143/76, pulse 86, temperature 97.7 F (36.5 C), temperature source Oral, resp. rate 20, SpO2 96 %.  age appropriate man.  semi-supine in NAD. he is alert and able to speak in fluent sentences.  But, he is confused and asks if Dr. Pixie Casino  Gave him the medicine. He thinks he has already been in the hospital a few days. Heart is rrr no m/r/g.  Chest is cta bilat. No w/r/r. abd is obese but soft, nt, nd, no mass, hsm No sig. Edema.  Assessment/Plan 74 yo male with recent uti and alerted mental status. i wonder if the sulfa drug could have something do with this.  Urine seems more clear now so i will not resume An antibiotic at the moment.   We will admit, we will gently hydrate.   We will cont. Home meds.  We will have him use cpap at night. Hopefully his  mental status will clear more with time.   If not he may need other evaluation, imaging , etc... Full code status.  Jerlyn Ly, MD 06/29/2014,  6:00 PM

## 2014-06-30 LAB — COMPREHENSIVE METABOLIC PANEL
ALBUMIN: 3.3 g/dL — AB (ref 3.5–5.2)
ALT: 30 U/L (ref 0–53)
ANION GAP: 10 (ref 5–15)
AST: 33 U/L (ref 0–37)
Alkaline Phosphatase: 58 U/L (ref 39–117)
BILIRUBIN TOTAL: 0.3 mg/dL (ref 0.3–1.2)
BUN: 27 mg/dL — AB (ref 6–23)
CALCIUM: 9.1 mg/dL (ref 8.4–10.5)
CHLORIDE: 104 mmol/L (ref 96–112)
CO2: 26 mmol/L (ref 19–32)
CREATININE: 1.49 mg/dL — AB (ref 0.50–1.35)
GFR, EST AFRICAN AMERICAN: 51 mL/min — AB (ref >90)
GFR, EST NON AFRICAN AMERICAN: 44 mL/min — AB (ref >90)
GLUCOSE: 108 mg/dL — AB (ref 70–99)
Potassium: 4.4 mmol/L (ref 3.5–5.1)
SODIUM: 140 mmol/L (ref 135–145)
TOTAL PROTEIN: 6.9 g/dL (ref 6.0–8.3)

## 2014-06-30 LAB — CBC
HCT: 39.1 % (ref 39.0–52.0)
Hemoglobin: 13 g/dL (ref 13.0–17.0)
MCH: 31.6 pg (ref 26.0–34.0)
MCHC: 33.2 g/dL (ref 30.0–36.0)
MCV: 95.1 fL (ref 78.0–100.0)
Platelets: 317 10*3/uL (ref 150–400)
RBC: 4.11 MIL/uL — AB (ref 4.22–5.81)
RDW: 13.1 % (ref 11.5–15.5)
WBC: 10 10*3/uL (ref 4.0–10.5)

## 2014-06-30 MED ORDER — ROPINIROLE HCL 0.5 MG PO TABS
0.5000 mg | ORAL_TABLET | Freq: Every day | ORAL | Status: DC
  Administered 2014-06-30: 0.5 mg via ORAL
  Filled 2014-06-30 (×2): qty 1

## 2014-06-30 MED ORDER — FLUCONAZOLE 100MG IVPB
100.0000 mg | Freq: Every day | INTRAVENOUS | Status: DC
Start: 2014-06-30 — End: 2014-07-01
  Administered 2014-06-30: 100 mg via INTRAVENOUS
  Filled 2014-06-30 (×2): qty 50

## 2014-06-30 MED ORDER — VITAMINS A & D EX OINT
TOPICAL_OINTMENT | CUTANEOUS | Status: AC
  Filled 2014-06-30: qty 5

## 2014-06-30 MED ORDER — ALPRAZOLAM 0.5 MG PO TABS
0.5000 mg | ORAL_TABLET | Freq: Three times a day (TID) | ORAL | Status: DC | PRN
Start: 2014-06-30 — End: 2014-07-01
  Administered 2014-06-30 (×2): 0.5 mg via ORAL
  Filled 2014-06-30 (×2): qty 1

## 2014-06-30 MED ORDER — CLOTRIMAZOLE 1 % EX CREA
TOPICAL_CREAM | Freq: Two times a day (BID) | CUTANEOUS | Status: DC
Start: 2014-06-30 — End: 2014-07-01
  Administered 2014-06-30 (×2): via TOPICAL
  Filled 2014-06-30: qty 15

## 2014-06-30 NOTE — Progress Notes (Signed)
Subjective: Did not sleep much last night, continues to have frequency with painful urination. His wife notes that he has frequent jerks at night with sleeping. Has unsteady gait.  Objective: Vital signs in last 24 hours: Temp:  [97.7 F (36.5 C)-99.4 F (37.4 C)] 98.3 F (36.8 C) (02/27 0448) Pulse Rate:  [84-94] 85 (02/27 0448) Resp:  [14-25] 18 (02/27 0448) BP: (104-144)/(56-91) 144/67 mmHg (02/27 0448) SpO2:  [91 %-96 %] 95 % (02/27 0448) Weight:  [135.8 kg (299 lb 6.2 oz)] 135.8 kg (299 lb 6.2 oz) (02/26 1644) Weight change:    Intake/Output from previous day: 02/26 0701 - 02/27 0700 In: 938.8 [P.O.:450; I.V.:163.8; IV Piggyback:325] Out: 275 [Urine:275]   General appearance: alert, cooperative and no distress Resp: clear to auscultation bilaterally Cardio: regular rate and rhythm, S1, S2 normal, no murmur, click, rub or gallop GI: soft, non-tender; bowel sounds normal; no masses,  no organomegaly Extremities: extremities normal, atraumatic, no cyanosis or edema alert, frequently yawns, oriented x 3, neurological exam signficant for decreased right hearing, gait not assessed  Lab Results:  Recent Labs  06/29/14 1708 06/30/14 0446  WBC 10.2 10.0  HGB 13.6 13.0  HCT 39.6 39.1  PLT 287 317   BMET  Recent Labs  06/29/14 0956 06/29/14 1708 06/30/14 0446  NA 136  --  140  K 4.1  --  4.4  CL 99  --  104  CO2 24  --  26  GLUCOSE 106*  --  108*  BUN 33*  --  27*  CREATININE 1.71* 1.60* 1.49*  CALCIUM 9.3  --  9.1   CMET CMP     Component Value Date/Time   NA 140 06/30/2014 0446   K 4.4 06/30/2014 0446   CL 104 06/30/2014 0446   CO2 26 06/30/2014 0446   GLUCOSE 108* 06/30/2014 0446   BUN 27* 06/30/2014 0446   CREATININE 1.49* 06/30/2014 0446   CALCIUM 9.1 06/30/2014 0446   PROT 6.9 06/30/2014 0446   ALBUMIN 3.3* 06/30/2014 0446   AST 33 06/30/2014 0446   ALT 30 06/30/2014 0446   ALKPHOS 58 06/30/2014 0446   BILITOT 0.3 06/30/2014 0446   GFRNONAA  44* 06/30/2014 0446   GFRAA 51* 06/30/2014 0446    CBG (last 3)   Recent Labs  06/29/14 0919  GLUCAP 103*    INR RESULTS:   Lab Results  Component Value Date   INR 1.00 09/02/2010   INR 1.26 05/01/2010     Studies/Results: Dg Chest 2 View  06/29/2014   CLINICAL DATA:  Altered mental status. History of atrial fibrillation  EXAM: CHEST  2 VIEW  COMPARISON:  February 24, 2013  FINDINGS: There is slight scarring in the left lung base. Lungs elsewhere clear. Heart is upper normal in size with pulmonary vascularity within normal limits. No adenopathy. There is mild degenerative change in the thoracic spine. There is a total shoulder replacement on the left.  IMPRESSION: Mild scarring left base.  No edema or consolidation.   Electronically Signed   By: Lowella Grip III M.D.   On: 06/29/2014 10:40   Ct Head Wo Contrast  06/29/2014   CLINICAL DATA:  Altered mental status  EXAM: CT HEAD WITHOUT CONTRAST  TECHNIQUE: Contiguous axial images were obtained from the base of the skull through the vertex without intravenous contrast.  COMPARISON:  None.  FINDINGS: There is moderate diffuse atrophy. There is no intracranial mass, hemorrhage, extra-axial fluid collection, or midline shift. There is patchy small  vessel disease in the centra semiovale bilaterally. Elsewhere gray-white compartments appear normal. No acute appearing infarct seen. The bony calvarium appears intact. The mastoid air cells are clear.  IMPRESSION: Moderate atrophy with patchy periventricular small vessel disease. No intracranial mass, hemorrhage, or acute appearing infarct.   Electronically Signed   By: Lowella Grip III M.D.   On: 06/29/2014 10:45    Medications: I have reviewed the patient's current medications.  Assessment/Plan: #1 Altered mental status:  Likely due to severe sleep deprivation, versus inadvertently taking too much pain meds. Improved with some quality sleep with CPAP last night. Likely has restless  legs, so we will add requip qhs. #2 Low Back Pain: stable on current meds #3 Gait Instability: unclear severity and will have PT eval today.  LOS: 1 day   Carlos Hanson G 06/30/2014, 9:47 AM

## 2014-06-30 NOTE — Progress Notes (Signed)
Pt tolerated cpap for about 5 minutes, then asked me to take it off.  Pt stated he is too "stuffed up" and is not going to be able to wear it tonight.  Pt was advised that RT is available all night should he change his mind.  RN notified.

## 2014-07-01 LAB — URINE CULTURE
CULTURE: NO GROWTH
Colony Count: NO GROWTH

## 2014-07-01 MED ORDER — CLOTRIMAZOLE 1 % EX CREA: TOPICAL_CREAM | Freq: Two times a day (BID) | CUTANEOUS | Status: DC

## 2014-07-01 MED ORDER — ROPINIROLE HCL 0.5 MG PO TABS: 0.5000 mg | ORAL_TABLET | Freq: Every day | ORAL | Status: DC

## 2014-07-01 NOTE — Discharge Summary (Signed)
Physician Discharge Summary  Patient ID: Carlos Hanson MRN: [id number] DOB/AGE: October 29, 1940 74 y.o.  Admit date: 06/29/2014 Discharge date: 07/01/2014   Discharge Diagnoses:  Principal Problem:   Altered mental state Active Problems:   UTI (lower urinary tract infection)   Obstructive sleep apnea hypopnea, severe   Back pain   Altered mental status   Discharged Condition: good  Hospital Course: The patient is a 74 year old man who is admitted from the emergency room because of confusion and hallucinations. About 4 weeks ago he was started on Cymbalta to help with chronic low back pain. In addition he was started on Septra earlier this week for urinary tract infection diagnosed with his urologist. He has been sleeping very little over the past week and not using prescribed CPAP for severe obstructive sleep apnea. His wife noted that the past evening he was up and down all night with confusion and visual hallucinations seeing bugs all over the place and blood covering his wife. He was also unsteady with ambulating. Workup in the emergency room included a CBC, complete metabolic panel, urinalysis, and CT scan of the head without IV contrast that were unremarkable. He was admitted for further evaluation.  He had some white blood cells in his urine and was started on Septra for this. Urine culture was pending at the time of dictation. He was also started on Requip because of a history of restless legs. His confusion resolved with having some use of CPAP overnight. On the morning of discharge she was somewhat tired as he declined to use positive pressure overnight. He was no longer confused, however. He was eating and drinking well and not having significant difficulty with ambulating to the bathroom. Procedures during his hospitalization included a CT scan of the head without IV contrast. There were no complications.  Consults: None  Significant Diagnostic Studies:  Dg Chest 2 View  06/29/2014    CLINICAL DATA:  Altered mental status. History of atrial fibrillation  EXAM: CHEST  2 VIEW  COMPARISON:  February 24, 2013  FINDINGS: There is slight scarring in the left lung base. Lungs elsewhere clear. Heart is upper normal in size with pulmonary vascularity within normal limits. No adenopathy. There is mild degenerative change in the thoracic spine. There is a total shoulder replacement on the left.  IMPRESSION: Mild scarring left base.  No edema or consolidation.   Electronically Signed   By: Lowella Grip III M.D.   On: 06/29/2014 10:40   Ct Head Wo Contrast  06/29/2014   CLINICAL DATA:  Altered mental status  EXAM: CT HEAD WITHOUT CONTRAST  TECHNIQUE: Contiguous axial images were obtained from the base of the skull through the vertex without intravenous contrast.  COMPARISON:  None.  FINDINGS: There is moderate diffuse atrophy. There is no intracranial mass, hemorrhage, extra-axial fluid collection, or midline shift. There is patchy small vessel disease in the centra semiovale bilaterally. Elsewhere gray-white compartments appear normal. No acute appearing infarct seen. The bony calvarium appears intact. The mastoid air cells are clear.  IMPRESSION: Moderate atrophy with patchy periventricular small vessel disease. No intracranial mass, hemorrhage, or acute appearing infarct.   Electronically Signed   By: Lowella Grip III M.D.   On: 06/29/2014 10:45    Labs: Lab Results  Component Value Date   WBC 10.0 06/30/2014   HGB 13.0 06/30/2014   HCT 39.1 06/30/2014   MCV 95.1 06/30/2014   PLT 317 06/30/2014     Recent Labs Lab 06/30/14 0446  NA 140  K 4.4  CL 104  CO2 26  BUN 27*  CREATININE 1.49*  CALCIUM 9.1  PROT 6.9  BILITOT 0.3  ALKPHOS 58  ALT 30  AST 33  GLUCOSE 108*       Lab Results  Component Value Date   INR 1.00 09/02/2010   INR 1.26 05/01/2010     Recent Results (from the past 240 hour(s))  Urine culture     Status: None   Collection Time: 06/29/14 11:17  AM  Result Value Ref Range Status   Specimen Description URINE, CATHETERIZED  Final   Special Requests NONE  Final   Colony Count NO GROWTH Performed at [facility]   Final   Culture NO GROWTH Performed at [facility]   Final   Report Status 07/01/2014 FINAL  Final      Discharge Exam: Blood pressure 139/82, pulse 92, temperature 97.9 F (36.6 C), temperature source Oral, resp. rate 18, height 6' (1.829 m), weight 135.8 kg (299 lb 6.2 oz), SpO2 98 %.  Physical Exam: In general, he is an overweight white man who was in no apparent distress while sitting having breakfast. HEENT exam was within normal limits, neck was supple without jugular venous distention, chest was clear to auscultation, heart had a regular rate and rhythm, abdomen had normal bowel sounds no tenderness, he had bilateral trace leg edema. He was alert and oriented 3 and able to move all extremities well.  Disposition: He'll be discharged from the hospital in the company of his wife. It was strongly recommended that he try his best to use CPAP every night as sleep deprivation appeared to be a precipitant of his visual hallucinations. We will again try to facilitate a follow-up visit with his sleep specialist. 35 minutes were spent in the process of this discharge.  Discharge Instructions    Call MD for:    Complete by:  As directed   Call for fever, chills, confusion, or any other concerning symptoms     Diet - low sodium heart healthy    Complete by:  As directed      Discharge instructions    Complete by:  As directed   Try to use the CPAP for sleep apnea as much as possible, as sleep deprivation likely led to confusion leading to hospitalization.     Increase activity slowly    Complete by:  As directed             Medication List    STOP taking these medications        diazepam 5 MG tablet  Commonly known as:  VALIUM      TAKE these medications        allopurinol 300 MG tablet   Commonly known as:  ZYLOPRIM  Take 300 mg by mouth daily.     amiodarone 200 MG tablet  Commonly known as:  PACERONE  Take 100 mg by mouth daily.     clotrimazole 1 % cream  Commonly known as:  LOTRIMIN  Apply topically 2 (two) times daily.     dabigatran 150 MG Caps capsule  Commonly known as:  PRADAXA  Take 150 mg by mouth every 12 (twelve) hours.     diltiazem 180 MG 24 hr capsule  Commonly known as:  DILACOR XR  Take 180 mg by mouth daily.     DULoxetine 30 MG capsule  Commonly known as:  CYMBALTA  Take 60 mg by mouth daily.  meclizine 25 MG tablet  Commonly known as:  ANTIVERT  Take 25 mg by mouth 3 (three) times daily as needed for dizziness (Otc).     morphine 15 MG tablet  Commonly known as:  MSIR  Take 15 mg by mouth 2 (two) times daily.     oxyCODONE-acetaminophen 10-325 MG per tablet  Commonly known as:  PERCOCET  Take 1 tablet by mouth 3 (three) times daily as needed for pain.     oxymetazoline 0.05 % nasal spray  Commonly known as:  AFRIN  Place 1 spray into both nostrils at bedtime as needed for congestion (sinuses).     pantoprazole 40 MG tablet  Commonly known as:  PROTONIX  Take 40 mg by mouth 2 (two) times daily.     rOPINIRole 0.5 MG tablet  Commonly known as:  REQUIP  Take 1 tablet (0.5 mg total) by mouth at bedtime.     rosuvastatin 10 MG tablet  Commonly known as:  CRESTOR  Take 10 mg by mouth daily.     sulfamethoxazole-trimethoprim 800-160 MG per tablet  Commonly known as:  BACTRIM DS,SEPTRA DS  Take 1 tablet by mouth 2 (two) times daily.     triamterene-hydrochlorothiazide 37.5-25 MG per tablet  Commonly known as:  MAXZIDE-25  Take 0.5 tablets by mouth daily.     zolpidem 10 MG tablet  Commonly known as:  AMBIEN  Take 10 mg by mouth at bedtime as needed. For insomnia.           Follow-up Information    Follow up with Donnajean Lopes, MD. Schedule an appointment as soon as possible for a visit in 1 week.   Specialty:   Internal Medicine   Contact information:   [place] [address] [phone]       Signed: Donnajean Lopes 07/01/2014, 8:54 AM

## 2014-07-01 NOTE — Plan of Care (Signed)
Problem: Phase III Progression Outcomes Goal: Discharge plan remains appropriate-arrangements made Outcome: Completed/Met Date Met:  07/01/14 Discharge instructions gone over with pt and wife. Wife to bring pt home. Scripts called in to pharmacy, pain script given to wife

## 2014-07-01 NOTE — Progress Notes (Signed)
Pt discharged via wheelchair to wife . He will be going home.

## 2014-07-06 ENCOUNTER — Encounter: Payer: Self-pay | Admitting: *Deleted

## 2014-07-09 ENCOUNTER — Ambulatory Visit (INDEPENDENT_AMBULATORY_CARE_PROVIDER_SITE_OTHER): Payer: Commercial Managed Care - HMO | Admitting: Neurology

## 2014-07-09 ENCOUNTER — Encounter: Payer: Self-pay | Admitting: Neurology

## 2014-07-09 VITALS — BP 130/85 | HR 93 | Resp 18 | Ht 72.0 in | Wt 297.0 lb

## 2014-07-09 DIAGNOSIS — I480 Paroxysmal atrial fibrillation: Secondary | ICD-10-CM | POA: Diagnosis not present

## 2014-07-09 DIAGNOSIS — E66813 Obesity, class 3: Secondary | ICD-10-CM | POA: Insufficient documentation

## 2014-07-09 DIAGNOSIS — E662 Morbid (severe) obesity with alveolar hypoventilation: Secondary | ICD-10-CM | POA: Diagnosis not present

## 2014-07-09 DIAGNOSIS — G2581 Restless legs syndrome: Secondary | ICD-10-CM | POA: Diagnosis not present

## 2014-07-09 DIAGNOSIS — G4733 Obstructive sleep apnea (adult) (pediatric): Secondary | ICD-10-CM

## 2014-07-09 NOTE — Patient Instructions (Signed)
Polysomnography (Sleep Studies) Polysomnography (PSG) is a series of tests used for detecting (diagnosing) obstructive sleep apnea and other sleep disorders. The tests measure how some parts of your body are working while you are sleeping. The tests are extensive and expensive. They are done in a sleep lab or hospital, and vary from center to center. Your caregiver may perform other more simple sleep studies and questionnaires before doing more complete and involved testing. Testing may not be covered by insurance. Some of these tests are:  An EEG (Electroencephalogram). This tests your brain waves and stages of sleep.  An EOG (Electrooculogram). This measures the movements of your eyes. It detects periods of REM (rapid eye movement) sleep, which is your dream sleep.  An EKG (Electrocardiogram). This measures your heart rhythm.  EMG (Electromyography). This is a measurement of how the muscles are working in your upper airway and your legs while sleeping.  An oximetry measurement. It measures how much oxygen (air) you are getting while sleeping.  Breathing efforts may be measured. The same test can be interpreted (understood) differently by different caregivers and centers that study sleep.  Studies may be given an apnea/hypopnea index (AHI). This is a number which is found by counting the times of no breathing or under breathing during the night, and relating those numbers to the amount of time spent in bed. When the AHI is greater than 15, the patient is likely to complain of daytime sleepiness. When the AHI is greater than 30, the patient is at increased risk for heart problems and must be followed more closely. Following the AHI also allows you to know how treatment is working. Simple oximetry (tracking the amount of oxygen that is taken in) can be used for screening patients who:  Do not have symptoms (problems) of OSA.  Have a normal Epworth Sleepiness Scale Score.  Have a low pre-test  probability of having OSA.  Have none of the upper airway problems likely to cause apnea.  Oximetry is also used to determine if treatment is effective in patients who showed significant desaturations (not getting enough oxygen) on their home sleep study. One extra measure of safety is to perform additional studies for the person who only snores. This is because no one can predict with absolute certainty who will have OSA. Those who show significant desaturations (not getting enough oxygen) are recommended to have a more detailed sleep study. Document Released: 10/25/2002 Document Revised: 07/13/2011 Document Reviewed: 06/26/2013 ExitCare Patient Information 2015 [employer]. This information is not intended to replace advice given to you by your health care provider. Make sure you discuss any questions you have with your health care provider.  

## 2014-07-09 NOTE — Progress Notes (Signed)
SLEEP MEDICINE CLINIC   Provider:  Larey Hanson, M D  Referring Provider: Leanna Battles, MD Primary Care Physician:  Carlos Lopes, MD  Chief Complaint  Patient presents with  . OSA CPAP    RM 10 Patient's wife is with him -. Vision L -20/200 R 20/70 W/O    HPI:  Carlos Hanson is a 74 y.o. male seen here as a referral from Dr. Philip Hanson for a sleep consultation.   Carlos Hanson underwent a split-night polysomnography upon referral of his primary care physician in 2011,  the date of this study was 4-20 9-11.  The patient was described as morbidly obese with claustrophobia, morning headaches , hypertension, reflux disease, and complaining of insomnia,  loud snoring and frequent nocturia as well as extreme daytime fatigue.  The patient's blood pressure was elevated and the night of the sleep study , also he was on multiple hypertensive medications losartan, metoprolol. The patient was diagnosed with a severe apnea at the time the AHI was 107.8. Lowest oxygen saturation was 49% at nadir was 26 minutes of desaturation. The patient was emergently split, CPAP was initiated at 6 cm water and titrated up. The technician decided to change to a BiPAP at finally 19/15 because further apneas and hypopneas occurred. The AHI was reduced to 5.3 at a pressure of 18/14 cm water.    Carlos Hanson reports that he never had any follow-up visits with me as the interpreting physician. And I have no documentation of such a visit either. Given that he has severe sleep apnea and has been faithfully using his CPAP machine he has no trouble to get new parts change of mask tubing and filter etc. as he changed to a new Medicare plan.  I would like to add that Carlos Hanson reports an Epworth sleepiness score of 12 points and fatigue severity score of 31 points.  As to his sleep habits; he goes to bed usually at 9:30 and likes to read in bed. It will take him half an hour to go to sleep. At home with his wife he is described  as a calm sleep or not very restless. He has severe arthritis of the spine which affects his sleep position. He usually likes to read seated and not so much in bed. He falls asleep in bed on his side. He will have on average to nocturia interruptions of sleep. The patient is in the morning rising after his wife wakes him. This is usually about 8:30 AM. Overall he estimated to get 7 hours of sleep, he has trouble with frequent waking up and trouble to go back to sleep. He was given a sleep aid. This has helped.  He reports dizziness and unilateral hearing loss.  The patient was recently hospitalized at the developed confusion in response to serial sleep deprivation. He had developed a urinary tract infection at the frequency nocturia fragmented sleep and did not allow him to rest well. This was in February 2016. He has no shift work history, no ENT surgeries.  The patient is now with [facility],  which limits the choices of durable medical equipment companies.  My nurse is currently trying to obtain a download.   Review of Systems: Out of a complete 14 system review, the patient complains of only the following symptoms, and all other reviewed systems are negative.   Epworth score 12  , Fatigue severity score 31   , depression score n/a , chronic back pain.    History  Social History  . Marital Status: Married    Spouse Name: Carlos Hanson   . Number of Children: 3  . Years of Education: college   Occupational History  .      Retired   Social History Main Topics  . Smoking status: Former Smoker -- 1.00 packs/day for 3 years    Types: Cigarettes  . Smokeless tobacco: Never Used     Comment: Quit 8 years ago.  . Alcohol Use: 8.4 oz/week    14 Standard drinks or equivalent per week     Comment: one drink a day  . Drug Use: No  . Sexual Activity: Not on file   Other Topics Concern  . Not on file   Social History Narrative   Patient lives at home with his wife Carlos Hanson).    Retired.    Education college.   Right handed.   Caffeine one cup of coffee daily.    Family History  Problem Relation Age of Onset  . Colon cancer Neg Hx   . Heart failure Mother   . Diabetes Mother   . Arthritis Mother   . High blood pressure Mother   . Heart failure Father     Past Medical History  Diagnosis Date  . Hypertension   . Gout   . Hypercholesteremia   . History of ulcer disease   . Chronic back pain   . Dysrhythmia     History of atrial fibrillation  . Sleep apnea     cpap sleep study 2011  . Arthritis   . H/O hiatal hernia   . History of bladder infections   . Nocturia   . History of bleeding ulcers   . Hearing decreased     right ear  . Coronary artery disease     40% mLAD/DIAG bifurcation 03/25/10   . Obstructive sleep apnea hypopnea, severe     Past Surgical History  Procedure Laterality Date  . Esophagogastroduodenoscopy    . Left shoulder replacement    . Tonsillectomy    . Back surgery    . Colonoscopy  08/27/2011    Procedure: COLONOSCOPY;  Surgeon: Rogene Houston, MD;  Location: [facility];  Service: Endoscopy;  Laterality: N/A;  930  . Joint replacement      left shoulder  . Cardiac catheterization      2011  . Cardioverson    . Lumbar laminectomy  03/15/2012    Procedure: LUMBAR LAMINECTOMY WITH  X-STOP 2 LEVEL;  Surgeon: Kristeen Miss, MD;  Location: [facility];  Service: Neurosurgery;  Laterality: N/A;  Lumbar two-three, three-four XSTOP    Current Outpatient Prescriptions  Medication Sig Dispense Refill  . allopurinol (ZYLOPRIM) 300 MG tablet Take 300 mg by mouth daily.    Marland Kitchen amiodarone (PACERONE) 200 MG tablet Take 100 mg by mouth daily.     . clotrimazole (LOTRIMIN) 1 % cream Apply topically 2 (two) times daily. 30 g 0  . dabigatran (PRADAXA) 150 MG CAPS Take 150 mg by mouth every 12 (twelve) hours.    Marland Kitchen diltiazem (DILACOR XR) 180 MG 24 hr capsule Take 180 mg by mouth daily.    . DULoxetine (CYMBALTA) 30 MG capsule Take 60 mg by mouth  daily.    . meclizine (ANTIVERT) 25 MG tablet Take 25 mg by mouth 3 (three) times daily as needed for dizziness (Otc).    . morphine (MSIR) 15 MG tablet Take 15 mg by mouth 2 (two) times daily.    Marland Kitchen  oxyCODONE-acetaminophen (PERCOCET) 10-325 MG per tablet Take 1 tablet by mouth 3 (three) times daily as needed for pain.    Marland Kitchen oxymetazoline (AFRIN) 0.05 % nasal spray Place 1 spray into both nostrils at bedtime as needed for congestion (sinuses).    . pantoprazole (PROTONIX) 40 MG tablet Take 40 mg by mouth 2 (two) times daily.    Marland Kitchen rOPINIRole (REQUIP) 0.5 MG tablet Take 1 tablet (0.5 mg total) by mouth at bedtime. 30 tablet 2  . rosuvastatin (CRESTOR) 10 MG tablet Take 10 mg by mouth daily.    Marland Kitchen sulfamethoxazole-trimethoprim (BACTRIM DS,SEPTRA DS) 800-160 MG per tablet Take 1 tablet by mouth 2 (two) times daily.    Marland Kitchen triamterene-hydrochlorothiazide (MAXZIDE-25) 37.5-25 MG per tablet Take 0.5 tablets by mouth daily.    Marland Kitchen zolpidem (AMBIEN) 10 MG tablet Take 10 mg by mouth at bedtime as needed. For insomnia.     No current facility-administered medications for this visit.    Allergies as of 07/09/2014 - Review Complete 06/29/2014  Allergen Reaction Noted  . Naprosyn [naproxen] Rash 08/27/2011    Vitals: BP 130/85 mmHg  Pulse 93  Resp 18  Ht 6' (1.829 m)  Wt 297 lb (134.718 kg)  BMI 40.27 kg/m2 Last Weight:  Wt Readings from Last 1 Encounters:  07/09/14 297 lb (134.718 kg)       Last Height:   Ht Readings from Last 1 Encounters:  07/09/14 6' (1.829 m)    Physical exam:  General: The patient is awake, alert and appears not in acute distress. The patient is well groomed. Head: Normocephalic, atraumatic. Neck is supple. Mallampati 4, invisible uvula , large chin, no retrognathia.   neck circumference . 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  Cardiovascular:  Hier today :Regular rate and rhythm , without  murmurs or carotid bruit, and without distended neck veins. Respiratory: Lungs  are clear to auscultation. Skin:  Without evidence of edema, or rash Trunk: BMI is severely  elevated and patient  has normal posture.  Neurologic exam : The patient is awake and alert, oriented to place and time.   Memory subjective described as intact. There is a normal attention span & concentration ability.  Speech is fluent without dysarthria, dysphonia or aphasia. Mood and affect are appropriate.  Cranial nerves: Pupils are equal and briskly reactive to light. Funduscopic exam without evidence of pallor , beginning cataract on the left , no edema.  Extraocular movements  in vertical and horizontal planes intact and without nystagmus. Visual fields by finger perimetry are intact. Hearing to finger rub intact.  Facial sensation intact to fine touch. Facial motor strength is symmetric and tongue and uvula move midline.  Motor exam:   Normal tone ,muscle bulk and strength in all extremities.  Sensory:  Fine touch, pinprick and vibration were tested in all extremities.  Loss of vibration in both feet to above ankle.   Coordination: Rapid alternating movements in the fingers/hands is normal. Finger-to-nose maneuver  normal without evidence of ataxia, dysmetria or tremor.  Gait and station: Patient walks without assistive device and is able unassisted to climb up to the exam table. Strength within normal limits. Stance is stable and normal.  Tandem gait is unfragmented. Romberg testing is negative.  Deep tendon reflexes: in the  upper and lower extremities are symmetric and intact. Babinski maneuver response is equivocal.  downgoing.   Assessment:  After physical and neurologic examination, review of laboratory studies, imaging, neurophysiology testing and pre-existing records, assessment  is   1) Mr. Ambs has been using a BiPAP machine under the guidance of [facility] until 2014. This is changed to [place] he no longer has a durable medical equipment company available. I will  contact a previa on his be health and make sure that he gets new supplies. He does still have the filter to change but he needs probably new headgear interface and tubing. I encouraged him to for now use his machine at the current settings we will invite him back for a re-titration study to see if the settings are still working for him or if he needs to have adjustments done. His machine is about 75 years old and by current [place] standards cannot be replaced unless broken.   The patient was advised of the nature of the diagnosed sleep disorder , the treatment options and risks for general a health and wellness arising from not treating the condition. Visit duration was 35 minutes.   Plan:  Treatment plan and additional workup : SPLIT study, [facility] to get new mask and headgear , tubing with heated coil.  He is using a FFM. [employer], medium size.   [facility] e mail for orders, Re-evaluate after 5 years with a SPLIT night, split at [facility] and score at 4 %, CO2 would be appreciated.  BMI remains very high,  Hypoventilation is a possible con facti or in daytime fatigue , he already lost  35 pounds  [place], [place].    Cc Dr. Einar Gip.    Asencion Partridge Maureen Duesing MD  07/09/2014

## 2014-07-24 ENCOUNTER — Telehealth: Payer: Self-pay | Admitting: Neurology

## 2014-07-24 NOTE — Telephone Encounter (Signed)
Patient is scheduled for a sleep study on 08/09/14 @8 :00PM. He is requesting that he gets a prescription for machine/supplies because he is out and unable to use his machine. He will need to be setup with a new DME company as well. He can be reached at [phone]

## 2014-07-25 NOTE — Telephone Encounter (Signed)
Spoke with patient.  His machine is old but is working.  He will require sleep study in lab before order for supplies and DME referral can be processed with [facility].

## 2014-08-07 NOTE — Telephone Encounter (Signed)
Need to know what he needs and which mask he is using. If not already ordered. CD

## 2014-08-07 NOTE — Telephone Encounter (Signed)
Patient has scheduled sleep study this Thursday.  Then referred to DME.

## 2014-08-09 ENCOUNTER — Ambulatory Visit (INDEPENDENT_AMBULATORY_CARE_PROVIDER_SITE_OTHER): Payer: Commercial Managed Care - HMO | Admitting: Neurology

## 2014-08-09 DIAGNOSIS — G4733 Obstructive sleep apnea (adult) (pediatric): Secondary | ICD-10-CM

## 2014-08-09 DIAGNOSIS — G2581 Restless legs syndrome: Secondary | ICD-10-CM

## 2014-08-09 DIAGNOSIS — I480 Paroxysmal atrial fibrillation: Secondary | ICD-10-CM

## 2014-08-09 DIAGNOSIS — E662 Morbid (severe) obesity with alveolar hypoventilation: Secondary | ICD-10-CM

## 2014-08-10 NOTE — Sleep Study (Signed)
See scanned document in Encounters tab

## 2014-08-23 ENCOUNTER — Telehealth: Payer: Self-pay | Admitting: *Deleted

## 2014-08-23 ENCOUNTER — Encounter: Payer: Self-pay | Admitting: Neurology

## 2014-08-23 NOTE — Telephone Encounter (Signed)
LMOM (identified vm) to call for results of sleep study/fim

## 2014-08-24 ENCOUNTER — Encounter: Payer: Self-pay | Admitting: Neurology

## 2014-09-03 ENCOUNTER — Telehealth: Payer: Self-pay | Admitting: *Deleted

## 2014-09-03 NOTE — Telephone Encounter (Signed)
error/fim. 

## 2014-09-03 NOTE — Telephone Encounter (Signed)
I have spoken with Carlos Hanson and advised that per RAS, sleep study showed mild osa, and he recommends cpap/bipap titration (he is currently using cpap).  He is agreeable with titration study.[name]

## 2014-09-25 ENCOUNTER — Other Ambulatory Visit: Payer: Self-pay

## 2014-09-25 ENCOUNTER — Ambulatory Visit (INDEPENDENT_AMBULATORY_CARE_PROVIDER_SITE_OTHER): Payer: Commercial Managed Care - HMO | Admitting: Urology

## 2014-09-25 DIAGNOSIS — G4733 Obstructive sleep apnea (adult) (pediatric): Secondary | ICD-10-CM

## 2014-09-25 DIAGNOSIS — R972 Elevated prostate specific antigen [PSA]: Secondary | ICD-10-CM

## 2014-09-25 DIAGNOSIS — N401 Enlarged prostate with lower urinary tract symptoms: Secondary | ICD-10-CM

## 2014-09-25 NOTE — Progress Notes (Signed)
It is unclear whether the titration order was put in when the results were called on 09/03/14. Pt was agreeable to titration study per note.

## 2014-10-08 ENCOUNTER — Telehealth: Payer: Self-pay

## 2014-10-10 NOTE — Telephone Encounter (Signed)
Error

## 2014-10-16 ENCOUNTER — Ambulatory Visit (INDEPENDENT_AMBULATORY_CARE_PROVIDER_SITE_OTHER): Payer: Commercial Managed Care - HMO | Admitting: Neurology

## 2014-10-16 DIAGNOSIS — G4733 Obstructive sleep apnea (adult) (pediatric): Secondary | ICD-10-CM

## 2014-10-16 NOTE — Sleep Study (Signed)
Please see the scanned sleep study interpretation located in the Procedure tab within the Chart Review section. 

## 2014-10-29 ENCOUNTER — Telehealth: Payer: Self-pay

## 2014-10-29 DIAGNOSIS — G4733 Obstructive sleep apnea (adult) (pediatric): Secondary | ICD-10-CM

## 2014-10-29 NOTE — Telephone Encounter (Signed)
Spoke to pt FQ:MKJIZ study results. He wishes to proceed with auto PAP. Wants it sent to [facility]. Made a f/u appt per insurance purposes on 8/29 at 3:00. Pt verbalized understanding to bring cpap and arrive 15 minutes early to visit.

## 2014-10-29 NOTE — Telephone Encounter (Signed)
Called pt to give sleep study results. Left a message on cell phone asking him to call me back.

## 2014-11-06 ENCOUNTER — Telehealth: Payer: Self-pay | Admitting: Neurology

## 2014-11-06 NOTE — Telephone Encounter (Signed)
Meredith at [facility] called me back and said that she apologized to the pt for not being able to set him up sooner. He asked her, "do you want me to die in my sleep?" and demanded to be set up today. She explained to him that she has appointments with pts with ventilators and tracheostomies today and cannot cancel those appointments. He finally agreed to be set up for Thursday at 1:30.  I called pt back and spoke with his wife (per DPR) and she informed me that they were contacted and do have an appointment with [facility] on Thursday. She thanked me for following up. I encouraged her to call me back with any concerns and I would be glad to help. She verbalized understanding.

## 2014-11-06 NOTE — Telephone Encounter (Signed)
Returned pt's phone call. He is upset that [facility] has not contacted him regarding setting up his cpap. I called and left a message with our contact, Betsy, at [facility] asking her if she knew why he wasn't contacted. I informed pt that I called Gwinda Passe and inquired what is going on, but I also informed him that he could call as well. He said, "I'm not calling, they are your customers, you need to call." I informed him that I did call and will call again. He wanted to know my full name, Betsy's full name, and [facility]'s address and phone number. I gave him all of this information. I confirmed that I did sent the order for his cpap to [facility] on 10/29/14 to [facility]. Pt said he would call them too.   I then called [facility] and spoke to [place], RT. She said that they had contacted the pt several times and were unable to leave a message with the patient. I confirmed the phone number with Ailene Ravel but they had the wrong phone number written down. I gave her the right number and she said she was going to call the patient immediately and get the appointment set up. I asked her to call me back with confirmation that she did get in touch with the patient.

## 2014-11-06 NOTE — Telephone Encounter (Signed)
Please call Carlos Hanson today if possible he is very upset and wants to know the status of his new c-pap machine. Pt stated that his old is no longer working and he is not able to sleep and very upset because he stated the new machine was suppose to have already been ordered.

## 2014-12-31 ENCOUNTER — Encounter: Payer: Self-pay | Admitting: Neurology

## 2014-12-31 ENCOUNTER — Ambulatory Visit (INDEPENDENT_AMBULATORY_CARE_PROVIDER_SITE_OTHER): Payer: Medicare HMO | Admitting: Neurology

## 2014-12-31 VITALS — BP 152/76 | HR 82 | Resp 20 | Ht 72.0 in | Wt 307.0 lb

## 2014-12-31 DIAGNOSIS — G4736 Sleep related hypoventilation in conditions classified elsewhere: Secondary | ICD-10-CM | POA: Diagnosis not present

## 2014-12-31 DIAGNOSIS — G4733 Obstructive sleep apnea (adult) (pediatric): Secondary | ICD-10-CM

## 2014-12-31 DIAGNOSIS — Z9989 Dependence on other enabling machines and devices: Secondary | ICD-10-CM

## 2014-12-31 NOTE — Progress Notes (Signed)
SLEEP MEDICINE CLINIC   Provider:  Larey Seat, M D  Referring Provider: Leanna Battles, MD Primary Care Physician:  Donnajean Lopes, MD  Chief Complaint  Patient presents with  . Follow-up    bipap, rm 80, with wife    HPI:  Carlos Hanson is a 74 y.o. male seen here as a referral from Dr. Philip Aspen for a sleep consultation.   Carlos Hanson underwent a split-night polysomnography upon referral of his primary care physician in 2011,  the date of this study was 08-30-09.  The patient was described as morbidly obese with claustrophobia, morning headaches , hypertension, reflux disease, and complaining of insomnia,  loud snoring and frequent nocturia as well as extreme daytime fatigue.  The patient's blood pressure was elevated and the night of the sleep study , also he was on multiple hypertensive medications losartan, metoprolol. The patient was diagnosed with a severe apnea at the time the AHI was 107.8. Lowest oxygen saturation was 49% at nadir was 26 minutes of desaturation. The patient was emergently split, CPAP was initiated at 6 cm water and titrated up. The technician decided to change to a BiPAP at finally 19/15 because further apneas and hypopneas occurred. The AHI was reduced to 5.3 at a pressure of 18/14 cm water.  As to his sleep habits; he goes to bed usually at 9:30 and likes to read in bed. It will take him half an hour to go to sleep. At home with his wife he is described as a calm sleep or not very restless. He has severe arthritis of the spine which affects his sleep position. He usually likes to read seated and not so much in bed. He falls asleep in bed on his side. He will have on average to nocturia interruptions of sleep. The patient is in the morning rising after his wife wakes him. This is usually about 8:30 AM. Overall he estimated to get 7 hours of sleep, he has trouble with frequent waking up and trouble to go back to sleep. He was given a sleep aid. This has helped.He  reports dizziness and unilateral hearing loss.  The patient was recently hospitalized at the developed confusion in response to serial sleep deprivation. He had developed a urinary tract infection at the frequency nocturia fragmented sleep and did not allow him to rest well. This was in February 2016. He has no shift work history, no ENT surgeries.  The patient is now with [facility],  which limits the choices of durable medical equipment companies.  My nurse is currently trying to obtain a download.     Interval visit from 12-31-14 Carlos Hanson underwent a baseline polysomnography with a date of recording 08-09-14 interpreted by my colleague Dr. Richardean Chimera. It showed an AHI of 13.8 and an RDI of 16 in supine his AHI was lower at 8.2 the REM AHI was 10.9. His lowest oxygen saturation was 83% but he had 263 minutes of desaturations. Heart rate was steady no arrhythmias were noted. He was titrated to CPAP in a return visit on 10-16-14 and a XL sized pico nasal mask was ordered. He still had desaturations down to 87% of oxygen saturation with 120 minutes duration during the titration study.   Carlos Hanson is here today for so-called compliance visit. A download was obtained from his CPAP which shows that he is using an AUTO set between 4 and 10 cm water pressure, with 3 cm EPR. 100% of the days used over the last  30 days and 100% of those over 4 hours of consecutive use with an average user time of 9 hours and 34 minutes. Residual AHI is 1.0. This is an excellent result and his 95th percentile pressure was 9.5 cm water. He endorsed today the Epworth score at 11 points.  There are no adjustments to the machine needed from a medical standpoint. The patient has an optimal resolution of his apnea. He may still have desaturations, as noted in his sleep study .  Review of Systems: Out of a complete 14 system review, the patient complains of only the following symptoms, and all other reviewed systems are negative.    I would like to add that Carlos Hanson reports an Epworth sleepiness score of  11 from previously 12 points and fatigue severity score was not answered, neither depression score.     Social History   Social History  . Marital Status: Married    Spouse Name: [name]   . Number of Children: 3  . Years of Education: college   Occupational History  .      Retired   Social History Main Topics  . Smoking status: Former Smoker -- 1.00 packs/day for 3 years    Types: Cigarettes  . Smokeless tobacco: Never Used     Comment: Quit 8 years ago.  . Alcohol Use: 8.4 oz/week    14 Standard drinks or equivalent per week     Comment: one drink a day  . Drug Use: No  . Sexual Activity: Not on file   Other Topics Concern  . Not on file   Social History Narrative   Patient lives at home with his wife [name]).    Retired.   Education college.   Right handed.   Caffeine one cup of coffee daily.    Family History  Problem Relation Age of Onset  . Colon cancer Neg Hx   . Heart failure Mother   . Diabetes Mother   . Arthritis Mother   . High blood pressure Mother   . Heart failure Father     Past Medical History  Diagnosis Date  . Hypertension   . Gout   . Hypercholesteremia   . History of ulcer disease   . Chronic back pain   . Dysrhythmia     History of atrial fibrillation  . Sleep apnea     cpap sleep study 2011  . Arthritis   . H/O hiatal hernia   . History of bladder infections   . Nocturia   . History of bleeding ulcers   . Hearing decreased     right ear  . Coronary artery disease     40% mLAD/DIAG bifurcation 03/25/10   . Obstructive sleep apnea hypopnea, severe     Past Surgical History  Procedure Laterality Date  . Esophagogastroduodenoscopy    . Left shoulder replacement    . Tonsillectomy    . Back surgery    . Colonoscopy  08/27/2011    Procedure: COLONOSCOPY;  Surgeon: Rogene Houston, MD;  Location: [facility];  Service: Endoscopy;  Laterality: N/A;   930  . Joint replacement      left shoulder  . Cardiac catheterization      2011  . Cardioverson    . Lumbar laminectomy  03/15/2012    Procedure: LUMBAR LAMINECTOMY WITH  X-STOP 2 LEVEL;  Surgeon: Kristeen Miss, MD;  Location: [facility];  Service: Neurosurgery;  Laterality: N/A;  Lumbar two-three, three-four XSTOP  Current Outpatient Prescriptions  Medication Sig Dispense Refill  . allopurinol (ZYLOPRIM) 300 MG tablet Take 300 mg by mouth daily.    Marland Kitchen amiodarone (PACERONE) 200 MG tablet Take 100 mg by mouth daily.     . clotrimazole (LOTRIMIN) 1 % cream Apply topically 2 (two) times daily. 30 g 0  . dabigatran (PRADAXA) 150 MG CAPS Take 150 mg by mouth every 12 (twelve) hours.    Marland Kitchen diltiazem (DILACOR XR) 180 MG 24 hr capsule Take 180 mg by mouth daily.    . DULoxetine (CYMBALTA) 30 MG capsule Take 60 mg by mouth daily.    Marland Kitchen morphine (MSIR) 15 MG tablet Take 15 mg by mouth 2 (two) times daily.    Marland Kitchen oxyCODONE-acetaminophen (PERCOCET) 10-325 MG per tablet Take 1 tablet by mouth 3 (three) times daily as needed for pain.    Marland Kitchen oxymetazoline (AFRIN) 0.05 % nasal spray Place 1 spray into both nostrils at bedtime as needed for congestion (sinuses).    . pantoprazole (PROTONIX) 40 MG tablet Take 40 mg by mouth 2 (two) times daily.    . rosuvastatin (CRESTOR) 10 MG tablet Take 10 mg by mouth daily.    Marland Kitchen sulfamethoxazole-trimethoprim (BACTRIM DS,SEPTRA DS) 800-160 MG per tablet Take 1 tablet by mouth 2 (two) times daily.    Marland Kitchen triamterene-hydrochlorothiazide (MAXZIDE-25) 37.5-25 MG per tablet Take 0.5 tablets by mouth daily.    Marland Kitchen zolpidem (AMBIEN) 10 MG tablet Take 10 mg by mouth at bedtime as needed. For insomnia.     No current facility-administered medications for this visit.    Allergies as of 12/31/2014 - Review Complete 12/31/2014  Allergen Reaction Noted  . Naprosyn [naproxen] Rash 08/27/2011    Vitals: BP 152/76 mmHg  Pulse 82  Resp 20  Ht 6' (1.829 m)  Wt 307 lb (139.254 kg)   BMI 41.63 kg/m2 Last Weight:  Wt Readings from Last 1 Encounters:  12/31/14 307 lb (139.254 kg)       Last Height:   Ht Readings from Last 1 Encounters:  12/31/14 6' (1.829 m)    Physical exam:  General: The patient is awake, alert and appears not in acute distress. The patient is well groomed. Head: Normocephalic, atraumatic. Neck is supple. Mallampati 4, invisible uvula , large chin, no retrognathia.   neck circumference . 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  Cardiovascular:  Hier today :Regular rate and rhythm , without  murmurs or carotid bruit, and without distended neck veins. Respiratory: Lungs are clear to auscultation. Skin:  Without evidence of edema, or rash Trunk: BMI is severely  elevated and patient  has normal posture.  Neurologic exam : The patient is awake and alert, oriented to place and time.   Memory subjective described as intact. There is a normal attention span & concentration ability.  Speech is fluent without dysarthria, dysphonia or aphasia. Mood and affect are appropriate.  Cranial nerves: Pupils are equal and briskly reactive to light. Funduscopic exam without evidence of pallor , beginning cataract on the left , no edema.  Extraocular movements  in vertical and horizontal planes intact and without nystagmus. Visual fields by finger perimetry are intact. Hearing to finger rub intact.  Facial sensation intact to fine touch. Facial motor strength is symmetric and tongue and uvula move midline. Symmetric shoulder shrug.   Motor exam:   Normal tone, muscle bulk and strength in all extremities.  Sensory:  Fine touch, pinprick and vibration were tested in all extremities. Loss of  vibration in both feet to above ankle.   Coordination:   Finger-to-nose maneuver  normal without evidence of ataxia, dysmetria or tremor.  Gait and station: Patient walks without assistive device and is able unassisted to climb up to the exam table.  Strength within normal  limits. Stance is stable and normal.  Tandem gait is unfragmented. Romberg testing is negative.  Deep tendon reflexes: in the  upper and lower extremities are symmetric and intact. Babinski maneuver response is equivocal.   Assessment:  After physical and neurologic examination, review of laboratory studies, imaging, neurophysiology testing and pre-existing records, assessment is   1) Carlos Hanson had confirmed rather mild sleep apnea but prolonged periods of oxygen desaturation at night. These were not completely alleviated during the CPAP titration. His download here in office today confirms 100% compliance and a very low residual apnea index of 1.0. I would like to confirm this with  an overnight pulse oximetry while using CPAP, to see  if desaturation still persist. 2) nocturnal twitching and myoclonus. He had reported this to his primary care physician, Dr. Sharlett Iles who prescribed a dopamine are witnessed. This is not completely corrected and I wonder if the patient may have hypoxemia related myoclonus at night. 3) obesity - main risk factor remains elevated body mass index.  The patient dreams more than he did before CPAP use, he is denying any RLS symptoms.   The patient was advised of the nature of the diagnosed sleep disorder , the treatment options and risks for general a health and wellness arising from not treating the condition. Visit duration was 35 minutes.   Plan:  Treatment plan and additional workup :  [facility] is [facility] in [facility]    Cc Dr. Einar Gip.    Asencion Partridge Zarianna Dicarlo MD  12/31/2014

## 2014-12-31 NOTE — Patient Instructions (Signed)
Hypoxemia Hypoxemia occurs when your blood does not contain enough oxygen. The body cannot work well when it does not have enough oxygen because every part of your body needs oxygen. Oxygen travels to all parts of the body through your blood. Hypoxemia can develop suddenly or can come on slowly. CAUSES Some common causes of hypoxemia include:  Long-term (chronic) lung diseases, such as chronic obstructive pulmonary disease (COPD) or interstitial lung disease.  Disorders that affect breathing at night, such as sleep apnea.  Fluid buildup in your lungs (pulmonary edema).  Lung infection (pneumonia).  Lung or throat cancer.  Abnormal blood flow that bypasses the lungs (shunt).  Certain diseasesthat affect nerves or muscles.  A collapsed lung (pneumothorax).  A blood clot in the lungs (pulmonary embolus).  Certain types of heart disease.  Slow or shallow breathing (hypoventilation).  Certain medicines.  High altitudes.  Toxic chemicals and gases. SIGNS AND SYMPTOMS Not everyone who has hypoxemia will develop symptoms. If the hypoxemia developed quickly, you will likely have symptoms such as shortness of breath. If the hypoxemia came on slowly over months or years, you may not notice any symptoms. Symptoms can include:  Shortness of breath (dyspnea).  Bluish color of the skin, lips, or nail beds.  Breathing that is fast, noisy, or shallow.  A fast heartbeat.  Feeling tired or sleepy.  Being confused or feeling anxious. DIAGNOSIS To determine if you have hypoxemia, your health care provider may perform:  A physical exam.  Blood tests.  A pulse oximetry. A sensor will be put on your finger, toe, or earlobe to measure the percent of oxygen in your blood. TREATMENT You will likely be treated with oxygen therapy. Depending on the cause of your hypoxemia, you may need oxygen for a short time (weeks or months), or you may need it indefinitely. Your health care provider  may also recommend other therapies to treat the underlying cause of your hypoxemia. HOME CARE INSTRUCTIONS  Only take over-the-counter or prescription medicines as directed by your health care provider.  Follow oxygen safety measures if you are on oxygen therapy. These may include:  Always having a backup supply of oxygen.  Not allowing anyone to smoke around oxygen.  Handling the oxygen tanks carefully and as instructed.  If you smoke, quit. Stay away from people who smoke.  Follow up with your health care provider as directed. SEEK MEDICAL CARE IF:  You have any concerns about your oxygen therapy.  You still have trouble breathing.  You become short of breath when you exercise.  You are tired when you wake up.  You have a headache when you wake up. SEEK IMMEDIATE MEDICAL CARE IF:   Your breathing gets worse.  You have new shortness of breath with normal activity.  You have a bluish color of the skin, lips, or nail beds.  You have confusion or cloudy thinking.  You cough up dark mucus.  You have chest pain.  You have a fever. MAKE SURE YOU:  Understand these instructions.  Will watch your condition.  Will get help right away if you are not doing well or get worse. Document Released: 11/03/2010 Document Revised: 04/25/2013 Document Reviewed: 11/17/2012 Exit[employer] Patient Information 2015 [employer]. This information is not intended to replace advice given to you by your health care provider. Make sure you discuss any questions you have with your health care provider.  

## 2015-01-22 ENCOUNTER — Telehealth: Payer: Self-pay

## 2015-01-22 DIAGNOSIS — R0902 Hypoxemia: Secondary | ICD-10-CM

## 2015-01-22 DIAGNOSIS — Z9989 Dependence on other enabling machines and devices: Secondary | ICD-10-CM

## 2015-01-22 DIAGNOSIS — G4733 Obstructive sleep apnea (adult) (pediatric): Secondary | ICD-10-CM

## 2015-01-22 DIAGNOSIS — E662 Morbid (severe) obesity with alveolar hypoventilation: Secondary | ICD-10-CM

## 2015-01-22 NOTE — Telephone Encounter (Signed)
Received results from [facility] for pt's ONO. Dr. Brett Fairy reviewed the report and gave a verbal order for 2 L nocturnal oxygen to be bled into cpap. I called and advised pt and his wife of this result and order. Pt and wife are agreeable to having the supplemental oxygen bled into cpap at night. I advised pt that [facility] will be contacting them to set up. Pt verbalized understanding.

## 2015-01-23 DIAGNOSIS — H269 Unspecified cataract: Secondary | ICD-10-CM | POA: Insufficient documentation

## 2015-01-24 NOTE — Addendum Note (Signed)
Addended by: Lester Elkton A on: 01/24/2015 02:27 PM   Modules accepted: Orders

## 2015-01-24 NOTE — Telephone Encounter (Signed)
Wife called back to advise [facility] in [place] needs copy of titrated sleep study and also needs pulse ox (where O2 sat's were 87 while he was asleep) before they can process order for oxygen to be added to CPAP

## 2015-01-24 NOTE — Telephone Encounter (Signed)
Advised pt's wife that [facility] already has access to these results but that I would fax them again. Pt's wife says that [facility] has called them twice today requesting these results. Pt and wife not pleased with [facility] services so far. I advised her that I will fax the results again and advise [facility] to please call me for further needs. Pt's wife verbalized understanding.

## 2015-01-25 NOTE — Telephone Encounter (Addendum)
Pt's wife called and was contacted last night from [facility] and was told they still have not received all the information they need to get O2 for home use. She is very upset with [facility] thus far. It was suggested that a new company can be used if she wanted. She expressed that they live to far to change right now. She also want to notify this office that they will be out of town for a week and should we call to please use the number provided.Please call and advise  Cell # [phone]

## 2015-01-28 ENCOUNTER — Ambulatory Visit: Payer: Medicare HMO | Admitting: Adult Health

## 2015-01-30 NOTE — Telephone Encounter (Signed)
Spoke with patients wife regarding oxygen order. explained medicare guidelines to her. Medicare requires another titration for patient to qualify for 02. Overnight oximetry qualifes him for another titration but not 02. I opologized for the trouble and misguided information that was given to [place] from [facility]. She was very understanding and appreciated the information. She will speak to her husband and try to convince him to come for one more study. He has an appointment with his primary doctor next week. She may call after that and schedule study.

## 2015-02-12 ENCOUNTER — Encounter: Payer: Self-pay | Admitting: Adult Health

## 2015-02-12 ENCOUNTER — Ambulatory Visit (INDEPENDENT_AMBULATORY_CARE_PROVIDER_SITE_OTHER): Payer: Commercial Managed Care - HMO | Admitting: Adult Health

## 2015-02-12 VITALS — BP 136/78 | HR 78 | Resp 18 | Ht 72.0 in | Wt 311.0 lb

## 2015-02-12 DIAGNOSIS — G4733 Obstructive sleep apnea (adult) (pediatric): Secondary | ICD-10-CM

## 2015-02-12 DIAGNOSIS — Z9989 Dependence on other enabling machines and devices: Principal | ICD-10-CM

## 2015-02-12 DIAGNOSIS — R0902 Hypoxemia: Secondary | ICD-10-CM

## 2015-02-12 NOTE — Progress Notes (Signed)
PATIENT: [name] DOB: [birth year]  REASON FOR VISIT: follow up- OSA on CPAP HISTORY FROM: patient  HISTORY OF PRESENT ILLNESS: Mr. Jumper is a 74 year old male with a history of obstructive sleep apnea on CPAP. He returns today to discuss his overnight pulse oximetry test. It was explained to the patient that Dr. Brett Fairy has recommended that he have supplement oxygen in addition to his CPAP at bedtime. However his insurance requires a another CPAP titration in order for this to be covered. Patient is amenable to having this test that however he would like to speak with Dr. Sharlett Iles to get his input before scheduling this test. Patient states that he uses his CPAP nightly. He does notice some changes with his memory. His wife has reported this as well. He states that this is  nothing significant but he notices that he is not as "sharp as he used to be." Patient's Epworth sleepiness score today is 8. His CPAP download indicates that he uses his machine 30 out of 30 days for compliance of 100%. He uses machine greater than 4 hours each night. On average he uses his machine 9 hours and 54 minutes. The patient's minimum pressure is 4 cm of water and maximum pressure of 10 cm of water with EPR of 3. The patient's residual AHI is 1.4 with no significant leak. He returns today for an evaluation.  HISTORY [facility]): Mr. Blitch underwent a baseline polysomnography with a date of recording 08-09-14 interpreted by my colleague Dr. Richardean Chimera. It showed an AHI of 13.8 and an RDI of 16 in supine his AHI was lower at 8.2 the REM AHI was 10.9. His lowest oxygen saturation was 83% but he had 263 minutes of desaturations. Heart rate was steady no arrhythmias were noted. He was titrated to CPAP in a return visit on 10-16-14 and a XL sized pico nasal mask was ordered. He still had desaturations down to 87% of oxygen saturation with 120 minutes duration during the titration study. Mr. Mobley is here today for  so-called compliance visit. A download was obtained from his CPAP which shows that he is using an AUTO set between 4 and 10 cm water pressure, with 3 cm EPR. 100% of the days used over the last 30 days and 100% of those over 4 hours of consecutive use with an average user time of 9 hours and 34 minutes. Residual AHI is 1.0. This is an excellent result and his 95th percentile pressure was 9.5 cm water. He endorsed today the Epworth score at 11 points.  There are no adjustments to the machine needed from a medical standpoint. The patient has an optimal resolution of his apnea. He may still have desaturations, as noted in his sleep study  REVIEW OF SYSTEMS: Out of a complete 14 system review of symptoms, the patient complains only of the following symptoms, and all other reviewed systems are negative. Blurred vision, hearing loss, dizziness, abdominal allergies, back pain, apnea, frequent waking    ALLERGIES: Allergies  Allergen Reactions  . Naprosyn [Naproxen] Rash    HOME MEDICATIONS: Outpatient Prescriptions Prior to Visit  Medication Sig Dispense Refill  . allopurinol (ZYLOPRIM) 300 MG tablet Take 300 mg by mouth daily.    Marland Kitchen amiodarone (PACERONE) 200 MG tablet Take 100 mg by mouth daily.     . clotrimazole (LOTRIMIN) 1 % cream Apply topically 2 (two) times daily. 30 g 0  . dabigatran (PRADAXA) 150 MG CAPS Take 150 mg by mouth  every 12 (twelve) hours.    Marland Kitchen diltiazem (DILACOR XR) 180 MG 24 hr capsule Take 180 mg by mouth daily.    . DULoxetine (CYMBALTA) 30 MG capsule Take 60 mg by mouth daily.    Marland Kitchen morphine (MSIR) 15 MG tablet Take 15 mg by mouth 2 (two) times daily.    Marland Kitchen oxyCODONE-acetaminophen (PERCOCET) 10-325 MG per tablet Take 1 tablet by mouth 3 (three) times daily as needed for pain.    Marland Kitchen oxymetazoline (AFRIN) 0.05 % nasal spray Place 1 spray into both nostrils at bedtime as needed for congestion (sinuses).    . pantoprazole (PROTONIX) 40 MG tablet Take 40 mg by mouth 2 (two)  times daily.    . rosuvastatin (CRESTOR) 10 MG tablet Take 10 mg by mouth daily.    Marland Kitchen sulfamethoxazole-trimethoprim (BACTRIM DS,SEPTRA DS) 800-160 MG per tablet Take 1 tablet by mouth 2 (two) times daily.    Marland Kitchen triamterene-hydrochlorothiazide (MAXZIDE-25) 37.5-25 MG per tablet Take 0.5 tablets by mouth daily.    Marland Kitchen zolpidem (AMBIEN) 10 MG tablet Take 10 mg by mouth at bedtime as needed. For insomnia.     No facility-administered medications prior to visit.    PAST MEDICAL HISTORY: Past Medical History  Diagnosis Date  . Hypertension   . Gout   . Hypercholesteremia   . History of ulcer disease   . Chronic back pain   . Dysrhythmia     History of atrial fibrillation  . Sleep apnea     cpap sleep study 2011  . Arthritis   . H/O hiatal hernia   . History of bladder infections   . Nocturia   . History of bleeding ulcers   . Hearing decreased     right ear  . Coronary artery disease     40% mLAD/DIAG bifurcation 03/25/10   . Obstructive sleep apnea hypopnea, severe     PAST SURGICAL HISTORY: Past Surgical History  Procedure Laterality Date  . Esophagogastroduodenoscopy    . Left shoulder replacement    . Tonsillectomy    . Back surgery    . Colonoscopy  08/27/2011    Procedure: COLONOSCOPY;  Surgeon: Rogene Houston, MD;  [facility];  Service: Endoscopy;  Laterality: N/A;  930  . Joint replacement      left shoulder  . Cardiac catheterization      2011  . Cardioverson    . Lumbar laminectomy  03/15/2012    Procedure: LUMBAR LAMINECTOMY WITH  X-STOP 2 LEVEL;  Surgeon: Kristeen Miss, MD;  Location: [facility];  Service: Neurosurgery;  Laterality: N/A;  Lumbar two-three, three-four XSTOP    FAMILY HISTORY: Family History  Problem Relation Age of Onset  . Colon cancer Neg Hx   . Heart failure Mother   . Diabetes Mother   . Arthritis Mother   . High blood pressure Mother   . Heart failure Father     SOCIAL HISTORY: Social History   Social History  .  Marital Status: Married    Spouse Name: [name]   . Number of Children: 3  . Years of Education: college   Occupational History  .      Retired   Social History Main Topics  . Smoking status: Former Smoker -- 1.00 packs/day for 3 years    Types: Cigarettes  . Smokeless tobacco: Never Used     Comment: Quit 8 years ago.  . Alcohol Use: 8.4 oz/week    14 Standard drinks or equivalent  per week     Comment: one drink a day  . Drug Use: No  . Sexual Activity: Not on file   Other Topics Concern  . Not on file   Social History Narrative   Patient lives at home with his wife [name]).    Retired.   Education college.   Right handed.   Caffeine one cup of coffee daily.      PHYSICAL EXAM  Filed Vitals:   02/12/15 1435  BP: 136/78  Pulse: 78  Resp: 18  Height: 6' (1.829 m)  Weight: 311 lb (141.069 kg)   Body mass index is 42.17 kg/(m^2).  Generalized: Well developed, in no acute distress  Neck: Circumference 20 inches, Mallampati 4+  Neurological examination  Mentation: Alert oriented to time, place, history taking. Follows all commands speech and language fluent Cranial nerve II-XII: Pupils were equal round reactive to light. Extraocular movements were full, visual field were full on confrontational test.  Uvula tongue midline.  \Motor: The motor testing reveals 5 over 5 strength of all 4 extremities. Good symmetric motor tone is noted throughout.  Sensory: Sensory testing is intact to soft touch on all 4 extremities. No evidence of extinction is noted.  Coordination: Cerebellar testing reveals good finger-nose-finger and heel-to-shin bilaterally.  Gait and station: Requires assistance when going from a sitting to standing position. Gait is normal.   Reflexes: Deep tendon reflexes are symmetric and normal bilaterally.   DIAGNOSTIC DATA (LABS, IMAGING, TESTING) - I reviewed patient records, labs, notes, testing and imaging myself where available.  Lab Results    Component Value Date   WBC 10.0 06/30/2014   HGB 13.0 06/30/2014   HCT 39.1 06/30/2014   MCV 95.1 06/30/2014   PLT 317 06/30/2014      Component Value Date/Time   NA 140 06/30/2014 0446   K 4.4 06/30/2014 0446   CL 104 06/30/2014 0446   CO2 26 06/30/2014 0446   GLUCOSE 108* 06/30/2014 0446   BUN 27* 06/30/2014 0446   CREATININE 1.49* 06/30/2014 0446   CALCIUM 9.1 06/30/2014 0446   PROT 6.9 06/30/2014 0446   ALBUMIN 3.3* 06/30/2014 0446   AST 33 06/30/2014 0446   ALT 30 06/30/2014 0446   ALKPHOS 58 06/30/2014 0446   BILITOT 0.3 06/30/2014 0446   GFRNONAA 44* 06/30/2014 0446   GFRAA 51* 06/30/2014 0446      ASSESSMENT AND PLAN 74 y.o. year old male  has a past medical history of Hypertension; Gout; Hypercholesteremia; History of ulcer disease; Chronic back pain; Dysrhythmia; Sleep apnea; Arthritis; H/O hiatal hernia; History of bladder infections; Nocturia; History of bleeding ulcers; Hearing decreased; Coronary artery disease; and Obstructive sleep apnea hypopnea, severe. here with:  1. Obstructive sleep apnea on CPAP 2. Hypoxemia  The patient CPAP download shows excellent compliance. His ONO did confirm hypoxemia. Supplemental oxygen is recommended. However he does need to complete a second titration study in order for this to be covered by his insurance. Patient would like his information faxed to his primary care provider. He will discuss this with his primary care provider and then call our office to schedule his titration study. He will follow-up in 3 months with Dr. Brett Fairy.   Ward Givens, MSN, NP-C 02/12/2015, 2:33 PM [facility] [address] [place], [place] [address] [phone]

## 2015-02-12 NOTE — Patient Instructions (Signed)
We will fax information to Dr. Sharlett Iles Please call sleep lab when you would like to schedule CPAP titration

## 2015-02-12 NOTE — Progress Notes (Signed)
I agree with the assessment and plan as directed by NP .The patient is known to me .   Jullien Granquist, MD  

## 2015-02-21 ENCOUNTER — Telehealth: Payer: Self-pay

## 2015-02-21 DIAGNOSIS — Z9989 Dependence on other enabling machines and devices: Principal | ICD-10-CM

## 2015-02-21 DIAGNOSIS — G4733 Obstructive sleep apnea (adult) (pediatric): Secondary | ICD-10-CM

## 2015-02-21 NOTE — Telephone Encounter (Signed)
Spoke to pt's wife. Pt's wife reports that they are willing to proceed with the cpap titration for pt to qualify for oxygen from medicare. I advised pt's wife that I would have our sleep lab call and schedule it. Pt's wife verbalized understanding.

## 2015-03-11 ENCOUNTER — Ambulatory Visit (INDEPENDENT_AMBULATORY_CARE_PROVIDER_SITE_OTHER): Payer: Commercial Managed Care - HMO | Admitting: Neurology

## 2015-03-11 DIAGNOSIS — G4733 Obstructive sleep apnea (adult) (pediatric): Secondary | ICD-10-CM

## 2015-03-11 DIAGNOSIS — Z9989 Dependence on other enabling machines and devices: Principal | ICD-10-CM

## 2015-03-12 NOTE — Sleep Study (Signed)
Please see the scanned sleep study interpretation located in the Procedure tab within the Chart Review section. 

## 2015-03-18 ENCOUNTER — Telehealth: Payer: Self-pay

## 2015-03-18 NOTE — Telephone Encounter (Signed)
I spoke with Dr. Brett Fairy regarding these patients sleep study results before I called him. This test was done to identify oxygen desaturations and to qualify for nocturnal o2 per Medicare guidelines. Dr. Brett Fairy said that pt does NOT qualify for oxygen based on this sleep study. Pt maintained o2 at or above 90% during the night.  I called pt and advised him that his oxygen looked good during his study and oxygen is not warranted at this time. I advised him that his sleep study did not reveal any significant sleep apnea. I advised him that his sleep was fragmented, and he had difficulties initiating sleep due to RLS. I advised him to avoid sedative/hypnotics, alcohol, and tobacco. I advised him to lose weight, diet, and exercise if not contraindicated. I advised him to avoid driving or operating hazardous machinery while sleepy. Pt states that he does not want a sooner appt than 1/12 with Dr. Brett Fairy. He wants me to fax a copy of his sleep study to Dr. Sharlett Iles. Pt verbalized understanding.

## 2015-03-26 ENCOUNTER — Ambulatory Visit (INDEPENDENT_AMBULATORY_CARE_PROVIDER_SITE_OTHER): Payer: Commercial Managed Care - HMO | Admitting: Urology

## 2015-03-26 DIAGNOSIS — N401 Enlarged prostate with lower urinary tract symptoms: Secondary | ICD-10-CM | POA: Diagnosis not present

## 2015-03-26 DIAGNOSIS — R972 Elevated prostate specific antigen [PSA]: Secondary | ICD-10-CM

## 2015-05-16 ENCOUNTER — Ambulatory Visit (INDEPENDENT_AMBULATORY_CARE_PROVIDER_SITE_OTHER): Payer: PPO | Admitting: Neurology

## 2015-05-16 ENCOUNTER — Encounter: Payer: Self-pay | Admitting: Neurology

## 2015-05-16 VITALS — BP 152/80 | HR 76 | Resp 20 | Ht 72.0 in | Wt 320.0 lb

## 2015-05-16 DIAGNOSIS — G894 Chronic pain syndrome: Secondary | ICD-10-CM | POA: Diagnosis not present

## 2015-05-16 DIAGNOSIS — Z9989 Dependence on other enabling machines and devices: Principal | ICD-10-CM

## 2015-05-16 DIAGNOSIS — G4733 Obstructive sleep apnea (adult) (pediatric): Secondary | ICD-10-CM | POA: Diagnosis not present

## 2015-05-16 NOTE — Progress Notes (Signed)
PATIENT: Carlos Hanson DOB: 08/11/1940  REASON FOR VISIT: follow up- OSA on CPAP HISTORY FROM: patient,  Dr Leanna Battles. GMA  HISTORY OF PRESENT ILLNESS:    Mr. [name] is a 75 year old male with a history of obstructive sleep apnea on CPAP. Seen in a RV today, 16 May 2015 [name] underwent a CPAP titration on 03/19/2015.  His baseline study is quoted  below. The results were delayed because the study had to be rescored,  Without  additional need of oxygen. He had been classified at being at a high risk of central and lower obstructive sleep apnea. The patient was titrated to 12 cm water pressure which resulted in an AHI of 0.0 a ResMed air fit P 10 and nasal pillow was used in large size. Oxygen supplement was no longer necessary once the CPAP reached a pressure above 10 cm. The average heart rate remained at 66 bpm in normal sinus rhythm. He did have a few limb movement related arousals but those were clinically insignificant. He had only 3 minutes of oxygen desaturation for a total sleep. Of 281 minutes. I would like to add that the tox the patient almost 60 minutes to finally fall asleep.  A compliance report is available and the patient has used a CPAP for 30 out of the last 30 days with 100% compliance at average use of 9 hours and 33 minutes each night he is using an AutoSet between 4 and 10 cm water pressure with full-time EPR his residual AHI is 1.6 he has very few air leaks and overall his pressure needs each night seem to be just around 10 cm water. He has felt more alert. He has not having trouble sleeping through the night, aside form the lab test night. The patient does not have comorbidities such as diabetes or hypertension,  but is listed as having some back pain and gout, paroxysmal atrial fibrillation's in the past. Back pain is chronic.      2016- CD/ MM He returns today to discuss his overnight pulse oximetry test. It was explained to the patient that Dr. Brett Fairy  has recommended that he have supplement oxygen in addition to his CPAP at bedtime. However his insurance requires a another CPAP titration in order for this to be covered. Patient is amenable to having this test that however he would like to speak with Dr. Sharlett Iles to get his input before scheduling this test. Patient states that he uses his CPAP nightly. He does notice some changes with his memory. His wife has reported this as well. He states that this is  nothing significant but he notices that he is not as "sharp as he used to be." Patient's Epworth sleepiness score today is 8. His CPAP download indicates that he uses his machine 30 out of 30 days for compliance of 100%. He uses machine greater than 4 hours each night. On average he uses his machine 9 hours and 54 minutes. The patient's minimum pressure is 4 cm of water and maximum pressure of 10 cm of water with EPR of 3. The patient's residual AHI is 1.4 with no significant leak. He returns today for an evaluation.  HISTORY [facility]): [name] underwent a baseline polysomnography with a date of recording 08-09-14 interpreted by my colleague Dr. Richardean Chimera. It showed an AHI of 13.8 and an RDI of 16 in supine his AHI was lower at 8.2 the REM AHI was 10.9. His lowest oxygen saturation was 83% but  he had 263 minutes of desaturations. Heart rate was steady no arrhythmias were noted. He was titrated to CPAP in a return visit on 10-16-14 and a XL sized pico nasal mask was ordered. He still had desaturations down to 87% of oxygen saturation with 120 minutes duration during the titration study. [name] is here today for so-called compliance visit. A download was obtained from his CPAP which shows that he is using an AUTO set between 4 and 10 cm water pressure, with 3 cm EPR. 100% of the days used over the last 30 days and 100% of those over 4 hours of consecutive use with an average user time of 9 hours and 34 minutes. Residual AHI is 1.0. This is an  excellent result and his 95th percentile pressure was 9.5 cm water. He endorsed today the Epworth score at 11 points.  There are no adjustments to the machine needed from a medical standpoint. The patient has an optimal resolution of his apnea. He may still have desaturations, as noted in his sleep study  REVIEW OF SYSTEMS: Out of a complete 14 system review of symptoms, the patient complains only of the following symptoms, and all other reviewed systems are negative. Blurred vision, hearing loss, dizziness, abdominal allergies, back pain, apnea, frequent waking  The patient noted participates of regular exercise -he prefers to swim which has helped his back, he remains overweight. He has not adhered to a diet.  ALLERGIES: Allergies  Allergen Reactions  . Naprosyn [Naproxen] Rash    HOME MEDICATIONS: Outpatient Prescriptions Prior to Visit  Medication Sig Dispense Refill  . allopurinol (ZYLOPRIM) 300 MG tablet Take 300 mg by mouth daily.    Marland Kitchen amiodarone (PACERONE) 200 MG tablet Take 100 mg by mouth daily.     . clotrimazole (LOTRIMIN) 1 % cream Apply topically 2 (two) times daily. 30 g 0  . dabigatran (PRADAXA) 150 MG CAPS Take 150 mg by mouth every 12 (twelve) hours.    Marland Kitchen diltiazem (DILACOR XR) 180 MG 24 hr capsule Take 180 mg by mouth daily.    . DULoxetine (CYMBALTA) 30 MG capsule Take 60 mg by mouth daily.    Marland Kitchen morphine (MSIR) 15 MG tablet Take 15 mg by mouth 2 (two) times daily.    Marland Kitchen oxyCODONE-acetaminophen (PERCOCET) 10-325 MG per tablet Take 1 tablet by mouth 3 (three) times daily as needed for pain.    Marland Kitchen oxymetazoline (AFRIN) 0.05 % nasal spray Place 1 spray into both nostrils at bedtime as needed for congestion (sinuses).    . pantoprazole (PROTONIX) 40 MG tablet Take 40 mg by mouth 2 (two) times daily.    . rosuvastatin (CRESTOR) 10 MG tablet Take 10 mg by mouth daily.    Marland Kitchen sulfamethoxazole-trimethoprim (BACTRIM DS,SEPTRA DS) 800-160 MG per tablet Take 1 tablet by mouth 2  (two) times daily.    Marland Kitchen triamterene-hydrochlorothiazide (MAXZIDE-25) 37.5-25 MG per tablet Take 0.5 tablets by mouth daily.    Marland Kitchen zolpidem (AMBIEN) 10 MG tablet Take 10 mg by mouth at bedtime as needed. For insomnia.     No facility-administered medications prior to visit.    PAST MEDICAL HISTORY: Past Medical History  Diagnosis Date  . Hypertension   . Gout   . Hypercholesteremia   . History of ulcer disease   . Chronic back pain   . Dysrhythmia     History of atrial fibrillation  . Sleep apnea     cpap sleep study 2011  . Arthritis   .  H/O hiatal hernia   . History of bladder infections   . Nocturia   . History of bleeding ulcers   . Hearing decreased     right ear  . Coronary artery disease     40% mLAD/DIAG bifurcation 03/25/10   . Obstructive sleep apnea hypopnea, severe     PAST SURGICAL HISTORY: Past Surgical History  Procedure Laterality Date  . Esophagogastroduodenoscopy    . Left shoulder replacement    . Tonsillectomy    . Back surgery    . Colonoscopy  08/27/2011    Procedure: COLONOSCOPY;  Surgeon: Rogene Houston, MD;  [facility];  Service: Endoscopy;  Laterality: N/A;  930  . Joint replacement      left shoulder  . Cardiac catheterization      2011  . Cardioverson    . Lumbar laminectomy  03/15/2012    Procedure: LUMBAR LAMINECTOMY WITH  X-STOP 2 LEVEL;  Surgeon: Kristeen Miss, MD;  Location: [place] [facility];  Service: Neurosurgery;  Laterality: N/A;  Lumbar two-three, three-four XSTOP    FAMILY HISTORY: Family History  Problem Relation Age of Onset  . Colon cancer Neg Hx   . Heart failure Mother   . Diabetes Mother   . Arthritis Mother   . High blood pressure Mother   . Heart failure Father     SOCIAL HISTORY: Social History   Social History  . Marital Status: Married    Spouse Name: [name]   . Number of Children: 3  . Years of Education: college   Occupational History  .      Retired   Social History Main Topics  .  Smoking status: Former Smoker -- 1.00 packs/day for 3 years    Types: Cigarettes  . Smokeless tobacco: Never Used     Comment: Quit 8 years ago.  . Alcohol Use: 8.4 oz/week    14 Standard drinks or equivalent per week     Comment: one drink a day  . Drug Use: No  . Sexual Activity: Not on file   Other Topics Concern  . Not on file   Social History Narrative   Patient lives at home with his wife [name]).    Retired.   Education college.   Right handed.   Caffeine one cup of coffee daily.      PHYSICAL EXAM  Filed Vitals:   05/16/15 1527  BP: 152/80  Pulse: 76  Resp: 20  Height: 6' (1.829 m)  Weight: 320 lb (145.151 kg)   Body mass index is 43.39 kg/(m^2).  Generalized: Well developed, in no acute distress  Neck: Circumference 21 inches, Mallampati 4+, large neck, diaphoretic skin, clear lungs. No retrognathia.   Neurological examination  Mentation: Alert oriented to time, place, history taking. Follows all commands speech and language fluent Cranial nerve ;Normal sense of smell and taste. Pupils were equal round reactive to light. Extraocular movements were full, visual field were full on confrontational test.  Uvula tongue midline.  \Motor: The motor testing reveals 5 over 5 strength of all 4 extremities. Good symmetric motor tone is noted throughout.  Sensory: Sensory testing is intact to soft touch on all 4 extremities. No evidence of extinction is noted.  Coordination: Cerebellar testing reveals good finger-nose-finger and heel-to-shin bilaterally.  Gait and station: Requires assistance when going from a sitting to standing position. Gait is normal.   Reflexes: Deep tendon reflexes are symmetric and normal bilaterally.   DIAGNOSTIC DATA (LABS, IMAGING, TESTING) -  I reviewed patient records, labs, notes, testing and imaging myself where available. Dr Shon Baton last visit notes were compared, last lab tests, too.   Lab Results  Component Value Date   WBC 10.0  06/30/2014   HGB 13.0 06/30/2014   HCT 39.1 06/30/2014   MCV 95.1 06/30/2014   PLT 317 06/30/2014      Component Value Date/Time   NA 140 06/30/2014 0446   K 4.4 06/30/2014 0446   CL 104 06/30/2014 0446   CO2 26 06/30/2014 0446   GLUCOSE 108* 06/30/2014 0446   BUN 27* 06/30/2014 0446   CREATININE 1.49* 06/30/2014 0446   CALCIUM 9.1 06/30/2014 0446   PROT 6.9 06/30/2014 0446   ALBUMIN 3.3* 06/30/2014 0446   AST 33 06/30/2014 0446   ALT 30 06/30/2014 0446   ALKPHOS 58 06/30/2014 0446   BILITOT 0.3 06/30/2014 0446   GFRNONAA 44* 06/30/2014 0446   GFRAA 51* 06/30/2014 0446      ASSESSMENT AND PLAN 75 y.o. year old male  has a past medical history of Hypertension; Gout; Hypercholesteremia; History of ulcer disease; Chronic back pain; Dysrhythmia; Sleep apnea; Arthritis; H/O hiatal hernia; History of bladder infections; Nocturia; History of bleeding ulcers; Hearing decreased; Coronary artery disease; and Obstructive sleep apnea hypopnea, severe. here with:  1. Obstructive sleep apnea on CPAP. Highly compliant .  2. Hypoxemia resolved on CAP.   This  25 Minute RV was to more than 50% of the time dedicated to face to face discussion.  Presented patient with results of sleep study from March and from December 2016 compared with recent downloads and ONO.   The patient CPAP download shows excellent compliance. His ONO did confirm hypoxemia before the re -titration.. Supplemental oxygen is not longer recommended.  Rv once a year , with wifi accessible downloading.   Larey Seat, MD   05/16/2015, 3:55 PM [facility] [address], [place], [place] [address] [phone]   Dr . Philip Aspen.

## 2015-05-23 DIAGNOSIS — G4733 Obstructive sleep apnea (adult) (pediatric): Secondary | ICD-10-CM | POA: Diagnosis not present

## 2015-05-27 DIAGNOSIS — N3941 Urge incontinence: Secondary | ICD-10-CM | POA: Diagnosis not present

## 2015-05-27 DIAGNOSIS — N3001 Acute cystitis with hematuria: Secondary | ICD-10-CM | POA: Diagnosis not present

## 2015-05-27 DIAGNOSIS — N3000 Acute cystitis without hematuria: Secondary | ICD-10-CM | POA: Diagnosis not present

## 2015-05-27 DIAGNOSIS — Z0000 Encounter for general adult medical examination without abnormal findings: Secondary | ICD-10-CM | POA: Diagnosis not present

## 2015-06-04 DIAGNOSIS — M545 Low back pain: Secondary | ICD-10-CM | POA: Diagnosis not present

## 2015-06-04 DIAGNOSIS — G4733 Obstructive sleep apnea (adult) (pediatric): Secondary | ICD-10-CM | POA: Diagnosis not present

## 2015-06-04 DIAGNOSIS — Z6841 Body Mass Index (BMI) 40.0 and over, adult: Secondary | ICD-10-CM | POA: Diagnosis not present

## 2015-06-04 DIAGNOSIS — I480 Paroxysmal atrial fibrillation: Secondary | ICD-10-CM | POA: Diagnosis not present

## 2015-06-04 DIAGNOSIS — M47814 Spondylosis without myelopathy or radiculopathy, thoracic region: Secondary | ICD-10-CM | POA: Diagnosis not present

## 2015-06-04 DIAGNOSIS — M47816 Spondylosis without myelopathy or radiculopathy, lumbar region: Secondary | ICD-10-CM | POA: Diagnosis not present

## 2015-06-17 DIAGNOSIS — I480 Paroxysmal atrial fibrillation: Secondary | ICD-10-CM | POA: Diagnosis not present

## 2015-06-17 DIAGNOSIS — G4733 Obstructive sleep apnea (adult) (pediatric): Secondary | ICD-10-CM | POA: Diagnosis not present

## 2015-06-17 DIAGNOSIS — Z6841 Body Mass Index (BMI) 40.0 and over, adult: Secondary | ICD-10-CM | POA: Diagnosis not present

## 2015-06-23 DIAGNOSIS — G4733 Obstructive sleep apnea (adult) (pediatric): Secondary | ICD-10-CM | POA: Diagnosis not present

## 2015-07-02 ENCOUNTER — Other Ambulatory Visit: Payer: Self-pay | Admitting: Urology

## 2015-07-02 ENCOUNTER — Ambulatory Visit (INDEPENDENT_AMBULATORY_CARE_PROVIDER_SITE_OTHER): Payer: PPO | Admitting: Urology

## 2015-07-02 DIAGNOSIS — N401 Enlarged prostate with lower urinary tract symptoms: Secondary | ICD-10-CM | POA: Diagnosis not present

## 2015-07-02 DIAGNOSIS — N3000 Acute cystitis without hematuria: Secondary | ICD-10-CM | POA: Diagnosis not present

## 2015-07-02 DIAGNOSIS — N3941 Urge incontinence: Secondary | ICD-10-CM | POA: Diagnosis not present

## 2015-07-05 ENCOUNTER — Telehealth: Payer: Self-pay | Admitting: Orthopaedic Surgery

## 2015-07-07 LAB — URINE CULTURE: Colony Count: >=100000

## 2015-07-08 NOTE — Telephone Encounter (Signed)
Rx done. 

## 2015-07-21 DIAGNOSIS — G4733 Obstructive sleep apnea (adult) (pediatric): Secondary | ICD-10-CM | POA: Diagnosis not present

## 2015-07-24 DIAGNOSIS — Z125 Encounter for screening for malignant neoplasm of prostate: Secondary | ICD-10-CM | POA: Diagnosis not present

## 2015-07-24 DIAGNOSIS — I10 Essential (primary) hypertension: Secondary | ICD-10-CM | POA: Diagnosis not present

## 2015-07-24 DIAGNOSIS — E784 Other hyperlipidemia: Secondary | ICD-10-CM | POA: Diagnosis not present

## 2015-07-24 DIAGNOSIS — M109 Gout, unspecified: Secondary | ICD-10-CM | POA: Diagnosis not present

## 2015-07-24 DIAGNOSIS — R7301 Impaired fasting glucose: Secondary | ICD-10-CM | POA: Diagnosis not present

## 2015-07-30 DIAGNOSIS — Z1389 Encounter for screening for other disorder: Secondary | ICD-10-CM | POA: Diagnosis not present

## 2015-07-30 DIAGNOSIS — M109 Gout, unspecified: Secondary | ICD-10-CM | POA: Diagnosis not present

## 2015-07-30 DIAGNOSIS — Z23 Encounter for immunization: Secondary | ICD-10-CM | POA: Diagnosis not present

## 2015-07-30 DIAGNOSIS — G4733 Obstructive sleep apnea (adult) (pediatric): Secondary | ICD-10-CM | POA: Diagnosis not present

## 2015-07-30 DIAGNOSIS — I480 Paroxysmal atrial fibrillation: Secondary | ICD-10-CM | POA: Diagnosis not present

## 2015-07-30 DIAGNOSIS — I10 Essential (primary) hypertension: Secondary | ICD-10-CM | POA: Diagnosis not present

## 2015-07-30 DIAGNOSIS — R7301 Impaired fasting glucose: Secondary | ICD-10-CM | POA: Diagnosis not present

## 2015-07-30 DIAGNOSIS — Z0000 Encounter for general adult medical examination without abnormal findings: Secondary | ICD-10-CM | POA: Diagnosis not present

## 2015-07-30 DIAGNOSIS — E784 Other hyperlipidemia: Secondary | ICD-10-CM | POA: Diagnosis not present

## 2015-07-30 DIAGNOSIS — M545 Low back pain: Secondary | ICD-10-CM | POA: Diagnosis not present

## 2015-07-30 DIAGNOSIS — Z6841 Body Mass Index (BMI) 40.0 and over, adult: Secondary | ICD-10-CM | POA: Diagnosis not present

## 2015-07-30 DIAGNOSIS — N183 Chronic kidney disease, stage 3 (moderate): Secondary | ICD-10-CM | POA: Diagnosis not present

## 2015-08-08 ENCOUNTER — Other Ambulatory Visit: Payer: Self-pay | Admitting: Internal Medicine

## 2015-08-08 DIAGNOSIS — M545 Low back pain: Secondary | ICD-10-CM

## 2015-08-10 ENCOUNTER — Ambulatory Visit
Admission: RE | Admit: 2015-08-10 | Discharge: 2015-08-10 | Disposition: A | Discharge status: Home / Self Care | Payer: PPO | Source: Ambulatory Visit | Attending: Internal Medicine | Admitting: Internal Medicine

## 2015-08-10 DIAGNOSIS — M545 Low back pain: Secondary | ICD-10-CM

## 2015-08-10 DIAGNOSIS — M5126 Other intervertebral disc displacement, lumbar region: Secondary | ICD-10-CM | POA: Diagnosis not present

## 2015-08-21 DIAGNOSIS — G4733 Obstructive sleep apnea (adult) (pediatric): Secondary | ICD-10-CM | POA: Diagnosis not present

## 2015-08-22 DIAGNOSIS — M546 Pain in thoracic spine: Secondary | ICD-10-CM | POA: Diagnosis not present

## 2015-08-22 DIAGNOSIS — M791 Myalgia: Secondary | ICD-10-CM | POA: Diagnosis not present

## 2015-08-22 DIAGNOSIS — G894 Chronic pain syndrome: Secondary | ICD-10-CM | POA: Diagnosis not present

## 2015-08-22 DIAGNOSIS — M961 Postlaminectomy syndrome, not elsewhere classified: Secondary | ICD-10-CM | POA: Diagnosis not present

## 2015-09-03 ENCOUNTER — Encounter: Payer: Self-pay | Admitting: Physical Therapy

## 2015-09-03 ENCOUNTER — Ambulatory Visit: Payer: PPO | Attending: Physical Medicine and Rehabilitation | Admitting: Physical Therapy

## 2015-09-03 DIAGNOSIS — M546 Pain in thoracic spine: Secondary | ICD-10-CM | POA: Diagnosis not present

## 2015-09-03 DIAGNOSIS — M545 Low back pain: Secondary | ICD-10-CM | POA: Insufficient documentation

## 2015-09-03 NOTE — Patient Instructions (Signed)
Trigger Point Dry Needling  . What is Trigger Point Dry Needling (DN)? o DN is a physical therapy technique used to treat muscle pain and dysfunction. Specifically, DN helps deactivate muscle trigger points (muscle knots).  o A thin filiform needle is used to penetrate the skin and stimulate the underlying trigger point. The goal is for a local twitch response (LTR) to occur and for the trigger point to relax. No medication of any kind is injected during the procedure.   . What Does Trigger Point Dry Needling Feel Like?  o The procedure feels different for each individual patient. Some patients report that they do not actually feel the needle enter the skin and overall the process is not painful. Very mild bleeding may occur. However, many patients feel a deep cramping in the muscle in which the needle was inserted. This is the local twitch response.   Marland Kitchen How Will I feel after the treatment? o Soreness is normal, and the onset of soreness may not occur for a few hours. Typically this soreness does not last longer than two days.  o Bruising is uncommon, however; ice can be used to decrease any possible bruising.  o In rare cases feeling tired or nauseous after the treatment is normal. In addition, your symptoms may get worse before they get better, this period will typically not last longer than 24 hours.   . What Can I do After My Treatment? o Increase your hydration by drinking more water for the next 24 hours. o You may place ice or heat on the areas treated that have become sore, however, do not use heat on inflamed or bruised areas. Heat often brings more relief post needling. o You can continue your regular activities, but vigorous activity is not recommended initially after the treatment for 24 hours. o DN is best combined with other physical therapy such as strengthening, stretching, and other therapies.   [name], PT

## 2015-09-03 NOTE — Therapy (Signed)
[facility] [place], [place], [address] Phone: [phone]   Fax:  [phone]  Physical Therapy Evaluation  Patient Details  Name: Carlos Hanson MRN: [id number] Date of Birth: February 29, 1941 Referring Provider: Laurence Spates, MD  Encounter Date: 09/03/2015      PT End of Session - 09/03/15 1341    Visit Number 1   Number of Visits 10   Date for PT Re-Evaluation 10/08/15   PT Start Time [id number]   PT Stop Time 1454   PT Time Calculation (min) 65 min   Activity Tolerance Patient tolerated treatment well   Behavior During Therapy [facility] for tasks assessed/performed      Past Medical History  Diagnosis Date  . Hypertension   . Gout   . Hypercholesteremia   . History of ulcer disease   . Chronic back pain   . Dysrhythmia     History of atrial fibrillation  . Sleep apnea     cpap sleep study 2011  . Arthritis   . H/O hiatal hernia   . History of bladder infections   . Nocturia   . History of bleeding ulcers   . Hearing decreased     right ear  . Coronary artery disease     40% mLAD/DIAG bifurcation 03/25/10   . Obstructive sleep apnea hypopnea, severe     Past Surgical History  Procedure Laterality Date  . Esophagogastroduodenoscopy    . Left shoulder replacement    . Tonsillectomy    . Back surgery    . Colonoscopy  08/27/2011    Procedure: COLONOSCOPY;  Surgeon: Rogene Houston, MD;  [facility];  Service: Endoscopy;  Laterality: N/A;  930  . Joint replacement      left shoulder  . Cardiac catheterization      2011  . Cardioverson    . Lumbar laminectomy  03/15/2012    Procedure: LUMBAR LAMINECTOMY WITH  X-STOP 2 LEVEL;  Surgeon: Kristeen Miss, MD;  Location: [place] [facility];  Service: Neurosurgery;  Laterality: N/A;  Lumbar two-three, three-four XSTOP    There were no vitals filed for this visit.       Subjective Assessment - 09/03/15 1337    Subjective Patient reports his upper back began hurting  about 6 weeks ago when he was swimming. Used to swim 1.5 hours per day. Pain has lessened overall, but continues with various activities including sit <> stand.   Pertinent History lumbar laminectomy L2/3, L3/4 and L5/S1, L shoulder replacement, HTN (from chart)   Diagnostic tests xray, mri    Patient Stated Goals decrease pain, return to swimming, stand longer than one hour   Currently in Pain? Yes   Pain Score 7    Pain Location Back   Pain Orientation Left   Pain Descriptors / Indicators Sharp   Pain Type Acute pain   Pain Radiating Towards between inf lateral angle of scap and t spine.   Pain Onset More than a month ago   Pain Frequency Intermittent   Aggravating Factors  standing and walking   Pain Relieving Factors lyiing down, sitting down            [facility] PT Assessment - 09/03/15 0001    Assessment   Medical Diagnosis Chronic pain, Myofascial pain; Thoracic pain   Referring Provider Laurence Spates, MD   Onset Date/Surgical Date 07/23/15   Next MD Visit 09/19/15   Precautions   Precautions None   Balance Screen  Has the patient fallen in the past 6 months Yes   How many times? 1   Has the patient had a decrease in activity level because of a fear of falling?  No   Is the patient reluctant to leave their home because of a fear of falling?  No   Prior Function   Level of Independence Independent   Observation/Other Assessments   Focus on Therapeutic Outcomes (FOTO)  55% limited   Posture/Postural Control   Posture Comments forward head 2 inches, depressed R shoulder, increased tone in L UT, lev scap and lower traps vs. R side   ROM / Strength   AROM / PROM / Strength AROM   AROM   AROM Assessment Site Cervical   Cervical Flexion wnl   Cervical Extension wnl   Cervical - Right Side Bend wnl   Cervical - Left Side Bend wnl  feels in scapula   Cervical - Right Rotation wnl   Cervical - Left Rotation decreased 50%   Palpation   Spinal mobility mild decrease in  thoracic spine mobility T5-T9   Palpation comment left lower trapezius/lats                   [facility] Adult PT Treatment/Exercise - 09/03/15 0001    Modalities   Modalities Electrical Stimulation;Moist Heat   Moist Heat Therapy   Number Minutes Moist Heat 15 Minutes   Moist Heat Location Other (comment)  thoracic spine   Electrical Stimulation   Electrical Stimulation Location L lower traps/paraspinals; premod 80-150 Hz to tolerance x 15 min   Electrical Stimulation Goals Pain;Tone   Manual Therapy   Manual Therapy Soft tissue mobilization   Soft tissue mobilization to L thoracic paraspinals and lower trapezius          Trigger Point Dry Needling - 09/03/15 1603    Consent Given? Yes   Education Handout Provided Yes   Muscles Treated Upper Body --  thoracic multifidus T6-8              PT Education - 09/03/15 1601    Education provided Yes   Education Details TPDN; precautions over lung field explained.   Person(s) Educated Patient   Methods Explanation;Handout   Comprehension Verbalized understanding             PT Long Term Goals - 09/03/15 1610    PT LONG TERM GOAL #1   Title I with HEP   Time 5   Period Weeks   Status New   PT LONG TERM GOAL #2   Title No pain in thoracic spine with ADLS   Time 5   Period Weeks   Status New   PT LONG TERM GOAL #3   Title Patient able to perform sit <> stand without thoracic spine pain   Time 5   Period Weeks   Status New               Plan - 09/03/15 1342    Clinical Impression Statement Patient presents with myofascial pain in the left thoracic spine around T7 affecting ADLS and limiting him from daily swimming for exercise. He has increased tone of L lower traps, paraspinals and L neck muscles. Patient responded well to TPDN to L thoracic multifidi and manual therapy.    Rehab Potential Good   PT Frequency 2x / week   PT Duration --  5 weeks   PT Treatment/Interventions ADLs/Self Care  Home Management;Cryotherapy;Electrical Stimulation;Moist Heat;Therapeutic exercise;Ultrasound;Patient/family education;Manual  techniques;Dry needling   PT Next Visit Plan Focus on manual release and DN to inf angle L scapula per MD. US/estim/heat for pain/tone.   PT Home Exercise Plan heat to needled area   Consulted and Agree with Plan of Care Patient      Patient will benefit from skilled therapeutic intervention in order to improve the following deficits and impairments:  Decreased range of motion, Pain, Postural dysfunction, Decreased activity tolerance, Decreased mobility  Visit Diagnosis: Pain in thoracic spine - Plan: PT plan of care cert/re-cert      G-Codes - [id number]    Functional Assessment Tool Used FOTO 55% LIMITED   Functional Limitation Mobility: Walking and moving around   Mobility: Walking and Moving Around Current Status [id number]) At least 40 percent but less than 60 percent impaired, limited or restricted   Mobility: Walking and Moving Around Goal Status [id number]) At least 40 percent but less than 60 percent impaired, limited or restricted       Problem List Patient Active Problem List   Diagnosis Date Noted  . Morbid obesity due to excess calories ([place]) 05/16/2015  . Chronic pain syndrome 05/16/2015  . OSA on CPAP 05/16/2015  . RLS (restless legs syndrome) 07/09/2014  . Paroxysmal atrial fibrillation ([place]) 07/09/2014  . Obesity hypoventilation syndrome ([place]) 07/09/2014  . OSA treated with BiPAP 07/09/2014  . Altered mental state 06/29/2014  . UTI (lower urinary tract infection) 06/29/2014    Class: Acute  . Back pain 06/29/2014    Class: Chronic  . Obstructive sleep apnea hypopnea, severe 06/29/2014    Class: Chronic  . Altered mental status 06/29/2014   [name] PT  09/03/2015, [facility] [address] [place], [place], [address] Phone: [phone]   Fax:  [phone]  Name: Carlos Hanson MRN: [id number] Date of Birth: Jul 05, 1940

## 2015-09-05 ENCOUNTER — Ambulatory Visit: Payer: PPO | Admitting: Physical Therapy

## 2015-09-05 DIAGNOSIS — M546 Pain in thoracic spine: Secondary | ICD-10-CM | POA: Diagnosis not present

## 2015-09-05 NOTE — Therapy (Signed)
[facility] [place], [place], [address] Phone: [phone]   Fax:  [phone]  Physical Therapy Treatment  Patient Details  Name: Carlos Hanson MRN: [id number] Date of Birth: 30-May-1940 Referring Provider: Laurence Spates, MD  Encounter Date: 09/05/2015      PT End of Session - 09/05/15 1603    Visit Number 2   Number of Visits 10   Date for PT Re-Evaluation 10/08/15   PT Start Time 0230   PT Stop Time 0324   PT Time Calculation (min) 54 min      Past Medical History  Diagnosis Date  . Hypertension   . Gout   . Hypercholesteremia   . History of ulcer disease   . Chronic back pain   . Dysrhythmia     History of atrial fibrillation  . Sleep apnea     cpap sleep study 2011  . Arthritis   . H/O hiatal hernia   . History of bladder infections   . Nocturia   . History of bleeding ulcers   . Hearing decreased     right ear  . Coronary artery disease     40% mLAD/DIAG bifurcation 03/25/10   . Obstructive sleep apnea hypopnea, severe     Past Surgical History  Procedure Laterality Date  . Esophagogastroduodenoscopy    . Left shoulder replacement    . Tonsillectomy    . Back surgery    . Colonoscopy  08/27/2011    Procedure: COLONOSCOPY;  Surgeon: Rogene Houston, MD;  [facility];  Service: Endoscopy;  Laterality: N/A;  930  . Joint replacement      left shoulder  . Cardiac catheterization      2011  . Cardioverson    . Lumbar laminectomy  03/15/2012    Procedure: LUMBAR LAMINECTOMY WITH  X-STOP 2 LEVEL;  Surgeon: Kristeen Miss, MD;  Location: [place] NEURO ORS;  Service: Neurosurgery;  Laterality: N/A;  Lumbar two-three, three-four XSTOP    There were no vitals filed for this visit.      Subjective Assessment - 09/05/15 1603    Subjective I was sore after that last treatment.   Pain Score 6    Pain Location Back   Pain Orientation Left   Pain Descriptors / Indicators Sharp   Pain Type Acute pain    Pain Onset More than a month ago                         [facility] Adult PT Treatment/Exercise - 09/05/15 1605    Modalities   Modalities Ultrasound   Moist Heat Therapy   Number Minutes Moist Heat 20 Minutes   Moist Heat Location --  Lumbar spine.   Acupuncturist Location L lower traps/paraspinals; premod 80-150 Hz to tolerance x 20 min   Ultrasound   Ultrasound Location --  Affected left thoracic spine.   Ultrasound Parameters 1.50 W/CM2   Ultrasound Goals Pain   Manual Therapy   Manual therapy comments STW/M and T-spine mobs and costo-vertebral mobs  x 11 minutes.                     PT Long Term Goals - 09/03/15 1610    PT LONG TERM GOAL #1   Title I with HEP   Time 5   Period Weeks   Status New   PT LONG TERM GOAL #2  Title No pain in thoracic spine with ADLS   Time 5   Period Weeks   Status New   PT LONG TERM GOAL #3   Title Patient able to perform sit <> stand without thoracic spine pain   Time 5   Period Weeks   Status New             Patient will benefit from skilled therapeutic intervention in order to improve the following deficits and impairments:     Visit Diagnosis: Pain in thoracic spine     Problem List Patient Active Problem List   Diagnosis Date Noted  . Morbid obesity due to excess calories ([place]) 05/16/2015  . Chronic pain syndrome 05/16/2015  . OSA on CPAP 05/16/2015  . RLS (restless legs syndrome) 07/09/2014  . Paroxysmal atrial fibrillation ([place]) 07/09/2014  . Obesity hypoventilation syndrome ([place]) 07/09/2014  . OSA treated with BiPAP 07/09/2014  . Altered mental state 06/29/2014  . UTI (lower urinary tract infection) 06/29/2014    Class: Acute  . Back pain 06/29/2014    Class: Chronic  . Obstructive sleep apnea hypopnea, severe 06/29/2014    Class: Chronic  . Altered mental status 06/29/2014    Ileta Ofarrell, [place] MPT 09/05/2015, 4:15 PM  [facility] [address] [place], [place], [address] Phone: [phone]   Fax:  [phone]  Name: Carlos Hanson MRN: [id number] Date of Birth: 09-Dec-1940

## 2015-09-10 ENCOUNTER — Ambulatory Visit: Payer: PPO | Admitting: Physical Therapy

## 2015-09-10 DIAGNOSIS — M546 Pain in thoracic spine: Secondary | ICD-10-CM | POA: Diagnosis not present

## 2015-09-10 NOTE — Therapy (Signed)
[facility] [place], [place], [address] Phone: [phone]   Fax:  [phone]  Physical Therapy Treatment  Patient Details  Name: Carlos Hanson MRN: [id number] Date of Birth: 01-23-1941 Referring Provider: Laurence Spates, MD  Encounter Date: 09/10/2015      PT End of Session - 09/10/15 1807    Visit Number 3   Number of Visits 10   Date for PT Re-Evaluation 10/08/15   PT Start Time 0146   PT Stop Time 0239   PT Time Calculation (min) 53 min   Activity Tolerance Patient tolerated treatment well   Behavior During Therapy [facility] for tasks assessed/performed      Past Medical History  Diagnosis Date  . Hypertension   . Gout   . Hypercholesteremia   . History of ulcer disease   . Chronic back pain   . Dysrhythmia     History of atrial fibrillation  . Sleep apnea     cpap sleep study 2011  . Arthritis   . H/O hiatal hernia   . History of bladder infections   . Nocturia   . History of bleeding ulcers   . Hearing decreased     right ear  . Coronary artery disease     40% mLAD/DIAG bifurcation 03/25/10   . Obstructive sleep apnea hypopnea, severe     Past Surgical History  Procedure Laterality Date  . Esophagogastroduodenoscopy    . Left shoulder replacement    . Tonsillectomy    . Back surgery    . Colonoscopy  08/27/2011    Procedure: COLONOSCOPY;  Surgeon: Rogene Houston, MD;  [facility];  Service: Endoscopy;  Laterality: N/A;  930  . Joint replacement      left shoulder  . Cardiac catheterization      2011  . Cardioverson    . Lumbar laminectomy  03/15/2012    Procedure: LUMBAR LAMINECTOMY WITH  X-STOP 2 LEVEL;  Surgeon: Kristeen Miss, MD;  Location: [place] NEURO ORS;  Service: Neurosurgery;  Laterality: N/A;  Lumbar two-three, three-four XSTOP    There were no vitals filed for this visit.      Subjective Assessment - 09/10/15 1809    Subjective That last treatment helped.  Pain is still there  but not as bad.   Pertinent History lumbar laminectomy L2/3, L3/4 and L5/S1, L shoulder replacement, HTN (from chart)   Diagnostic tests xray, mri    Patient Stated Goals decrease pain, return to swimming, stand longer than one hour   Pain Score 4    Pain Location Back   Pain Orientation Left   Pain Descriptors / Indicators Sharp   Pain Type Acute pain   Pain Onset More than a month ago                                      PT Long Term Goals - 09/03/15 1610    PT LONG TERM GOAL #1   Title I with HEP   Time 5   Period Weeks   Status New   PT LONG TERM GOAL #2   Title No pain in thoracic spine with ADLS   Time 5   Period Weeks   Status New   PT LONG TERM GOAL #3   Title Patient able to perform sit <> stand without thoracic spine pain   Time 5  Period Weeks   Status New             Patient will benefit from skilled therapeutic intervention in order to improve the following deficits and impairments:     Visit Diagnosis: Pain in thoracic spine     Problem List Patient Active Problem List   Diagnosis Date Noted  . Morbid obesity due to excess calories ([place]) 05/16/2015  . Chronic pain syndrome 05/16/2015  . OSA on CPAP 05/16/2015  . RLS (restless legs syndrome) 07/09/2014  . Paroxysmal atrial fibrillation ([place]) 07/09/2014  . Obesity hypoventilation syndrome ([place]) 07/09/2014  . OSA treated with BiPAP 07/09/2014  . Altered mental state 06/29/2014  . UTI (lower urinary tract infection) 06/29/2014    Class: Acute  . Back pain 06/29/2014    Class: Chronic  . Obstructive sleep apnea hypopnea, severe 06/29/2014    Class: Chronic  . Altered mental status 06/29/2014   Treatment:  In prone STW/M and costo-vertebral mobs to affected left thoracic region preceded by U/S at 1.50 W/CM2 x 12 minutes f/b e'stim and HMP while seated x 20 minutes. Xayvier Vallez, [place] MPT 09/10/2015, 6:14 PM  [facility] [place], [place], [address] Phone: [phone]   Fax:  [phone]  Name: Carlos Hanson MRN: [id number] Date of Birth: 1940/09/18

## 2015-09-12 ENCOUNTER — Ambulatory Visit: Payer: PPO | Admitting: Physical Therapy

## 2015-09-12 DIAGNOSIS — M546 Pain in thoracic spine: Secondary | ICD-10-CM | POA: Diagnosis not present

## 2015-09-12 DIAGNOSIS — G4733 Obstructive sleep apnea (adult) (pediatric): Secondary | ICD-10-CM | POA: Diagnosis not present

## 2015-09-12 NOTE — Therapy (Signed)
[facility] [place], [place], [address] Phone: [phone]   Fax:  [phone]  Physical Therapy Treatment  Patient Details  Name: Carlos Hanson MRN: [id number] Date of Birth: 01-22-1941 Referring Provider: Laurence Spates, MD  Encounter Date: 09/12/2015      PT End of Session - 09/12/15 1300    Visit Number 4   Number of Visits 10   PT Start Time 1300   PT Stop Time 1400   PT Time Calculation (min) 60 min   Activity Tolerance Patient tolerated treatment well   Behavior During Therapy [facility] for tasks assessed/performed      Past Medical History  Diagnosis Date  . Hypertension   . Gout   . Hypercholesteremia   . History of ulcer disease   . Chronic back pain   . Dysrhythmia     History of atrial fibrillation  . Sleep apnea     cpap sleep study 2011  . Arthritis   . H/O hiatal hernia   . History of bladder infections   . Nocturia   . History of bleeding ulcers   . Hearing decreased     right ear  . Coronary artery disease     40% mLAD/DIAG bifurcation 03/25/10   . Obstructive sleep apnea hypopnea, severe     Past Surgical History  Procedure Laterality Date  . Esophagogastroduodenoscopy    . Left shoulder replacement    . Tonsillectomy    . Back surgery    . Colonoscopy  08/27/2011    Procedure: COLONOSCOPY;  Surgeon: Rogene Houston, MD;  [facility];  Service: Endoscopy;  Laterality: N/A;  930  . Joint replacement      left shoulder  . Cardiac catheterization      2011  . Cardioverson    . Lumbar laminectomy  03/15/2012    Procedure: LUMBAR LAMINECTOMY WITH  X-STOP 2 LEVEL;  Surgeon: Kristeen Miss, MD;  Location: [place] NEURO ORS;  Service: Neurosurgery;  Laterality: N/A;  Lumbar two-three, three-four XSTOP    There were no vitals filed for this visit.      Subjective Assessment - 09/12/15 1300    Subjective Patient has not had any significant pain since his last visit. Patient reports no pain  but he can get 2/10 pain if he takes a really deep breath.   Pertinent History lumbar laminectomy L2/3, L3/4 and L5/S1, L shoulder replacement, HTN (from chart)   Patient Stated Goals decrease pain, return to swimming, stand longer than one hour   Currently in Pain? Yes   Pain Score 2    Pain Location Back   Pain Orientation Left   Pain Descriptors / Indicators [facility] Adult PT Treatment/Exercise - 09/12/15 0001    Modalities   Modalities Electrical Stimulation;Moist Heat;Ultrasound   Moist Heat Therapy   Number Minutes Moist Heat 20 Minutes   Moist Heat Location --  thoracic spine   Electrical Stimulation   Electrical Stimulation Location L lower traps/paraspinals; premod 80-150 Hz to tolerance x 20 min   Ultrasound   Ultrasound Location L thoracic spine/paraspinals   Ultrasound Parameters 1.5 w/cm2 1  mhz cont x 10 min   Ultrasound Goals Pain   Manual Therapy   Manual Therapy Soft tissue mobilization;Joint mobilization   Joint Mobilization thoracicspine PA and rotational  Soft tissue mobilization to L thoracic paraspinals and lower traps                     PT Long Term Goals - 09/12/15 1606    PT LONG TERM GOAL #1   Title I with HEP   Time 5   Period Weeks   Status On-going   PT LONG TERM GOAL #2   Title No pain in thoracic spine with ADLS   Time 5   Period Weeks   Status On-going   PT LONG TERM GOAL #3   Title Patient able to perform sit <> stand without thoracic spine pain   Time 5   Status Achieved               Plan - 09/12/15 1607    Clinical Impression Statement Patient is progressing well toward goals with only 2/10 intermittent pain now. LTG #3 has been met.   Rehab Potential Good   PT Frequency 2x / week   PT Duration --  5 weeks   PT Treatment/Interventions ADLs/Self Care Home Management;Cryotherapy;Electrical Stimulation;Moist Heat;Therapeutic exercise;Ultrasound;Patient/family  education;Manual techniques;Dry needling   PT Next Visit Plan MD note for MD appointment 09/19/15.    Consulted and Agree with Plan of Care Patient      Patient will benefit from skilled therapeutic intervention in order to improve the following deficits and impairments:  Decreased range of motion, Pain, Postural dysfunction, Decreased activity tolerance, Decreased mobility  Visit Diagnosis: Pain in thoracic spine     Problem List Patient Active Problem List   Diagnosis Date Noted  . Morbid obesity due to excess calories ([place]) 05/16/2015  . Chronic pain syndrome 05/16/2015  . OSA on CPAP 05/16/2015  . RLS (restless legs syndrome) 07/09/2014  . Paroxysmal atrial fibrillation ([place]) 07/09/2014  . Obesity hypoventilation syndrome ([place]) 07/09/2014  . OSA treated with BiPAP 07/09/2014  . Altered mental state 06/29/2014  . UTI (lower urinary tract infection) 06/29/2014    Class: Acute  . Back pain 06/29/2014    Class: Chronic  . Obstructive sleep apnea hypopnea, severe 06/29/2014    Class: Chronic  . Altered mental status 06/29/2014    [name] PT  09/12/2015, 4:10 PM  [facility] [place], [place], [address] Phone: [phone]   Fax:  [phone]  Name: Carlos Hanson MRN: [id number] Date of Birth: [birth year]

## 2015-09-17 ENCOUNTER — Encounter: Payer: Self-pay | Admitting: Physical Therapy

## 2015-09-17 ENCOUNTER — Ambulatory Visit: Payer: PPO | Admitting: Physical Therapy

## 2015-09-17 DIAGNOSIS — M546 Pain in thoracic spine: Secondary | ICD-10-CM | POA: Diagnosis not present

## 2015-09-17 NOTE — Therapy (Signed)
[facility] [place], [place], [address] Phone: [phone]   Fax:  [phone]  Physical Therapy Treatment  Patient Details  Name: Carlos Hanson MRN: [id number] Date of Birth: Jun 27, 1940 Referring Provider: Laurence Spates, MD  Encounter Date: 09/17/2015      PT End of Session - 09/17/15 1349    Visit Number 5   Number of Visits 10   Date for PT Re-Evaluation 10/08/15   PT Start Time [id number]   PT Stop Time 1434   PT Time Calculation (min) 45 min   Activity Tolerance Patient tolerated treatment well   Behavior During Therapy [facility] for tasks assessed/performed      Past Medical History  Diagnosis Date  . Hypertension   . Gout   . Hypercholesteremia   . History of ulcer disease   . Chronic back pain   . Dysrhythmia     History of atrial fibrillation  . Sleep apnea     cpap sleep study 2011  . Arthritis   . H/O hiatal hernia   . History of bladder infections   . Nocturia   . History of bleeding ulcers   . Hearing decreased     right ear  . Coronary artery disease     40% mLAD/DIAG bifurcation 03/25/10   . Obstructive sleep apnea hypopnea, severe     Past Surgical History  Procedure Laterality Date  . Esophagogastroduodenoscopy    . Left shoulder replacement    . Tonsillectomy    . Back surgery    . Colonoscopy  08/27/2011    Procedure: COLONOSCOPY;  Surgeon: Rogene Houston, MD;  [facility];  Service: Endoscopy;  Laterality: N/A;  930  . Joint replacement      left shoulder  . Cardiac catheterization      2011  . Cardioverson    . Lumbar laminectomy  03/15/2012    Procedure: LUMBAR LAMINECTOMY WITH  X-STOP 2 LEVEL;  Surgeon: Kristeen Miss, MD;  Location: [place] [facility];  Service: Neurosurgery;  Laterality: N/A;  Lumbar two-three, three-four XSTOP    There were no vitals filed for this visit.      Subjective Assessment - 09/17/15 1348    Subjective Reports that he is having an "iffy" day. Reports  not having a lot of strength in back and legs.   Pertinent History lumbar laminectomy L2/3, L3/4 and L5/S1, L shoulder replacement, HTN (from chart)   Diagnostic tests xray, mri    Patient Stated Goals decrease pain, return to swimming, stand longer than one hour   Currently in Pain? Other (Comment)  Reported pain but gave no numerical pain rating or place of pain            [facility] PT Assessment - 09/17/15 0001    Assessment   Medical Diagnosis Chronic pain, Myofascial pain; Thoracic pain   Onset Date/Surgical Date 07/23/15   Next MD Visit 09/19/15   Precautions   Precautions None                     [facility] Adult PT Treatment/Exercise - 09/17/15 0001    Modalities   Modalities Electrical Stimulation;Moist Heat;Ultrasound   Moist Heat Therapy   Number Minutes Moist Heat 15 Minutes   Moist Heat Location Other (comment)  Thoracic spine in sitting   Electrical Stimulation   Electrical Stimulation Location L thoracic paraspinals   Electrical Stimulation Action Pre-Mod   Electrical Stimulation Parameters 80-150 hz x15  min   Electrical Stimulation Goals Pain;Tone   Ultrasound   Ultrasound Location L thoracic paraspinals  in prone   Ultrasound Parameters 1.5 w/cm2, 100%, 3.3 mhzx10 min   Ultrasound Goals Pain   Manual Therapy   Manual Therapy Myofascial release   Myofascial Release MFR/STW to L thoracic paraspinals in prone to decrease tightness and pain reported by patient                      PT Long Term Goals - 09/17/15 1420    PT LONG TERM GOAL #1   Title I with HEP   Time 5   Period Weeks   Status On-going   PT LONG TERM GOAL #2   Title No pain in thoracic spine with ADLS   Time 5   Period Weeks   Status On-going  Continues to report pain and weakness 09/17/2015   PT LONG TERM GOAL #3   Title Patient able to perform sit <> stand without thoracic spine pain   Time 5   Status Achieved               Plan - 09/17/15 1437     Clinical Impression Statement Patient tolerated today's treatment well with only report of pain with palpation of mid to lower thoracic paraspinals. Normal modalities response noted following removal of the modalities. Inflammation and minimal increased tightness noted in mid to lower thoracic paraspinals upon comparison in prone to R thoracic musculature. Patient reports that weather affects him as he has noticed and mid back pain has decreased since beginning PT. Patient reports weakness in BLE and instability upon standing today. 5th visit FOTO resuts: intake limitation 55% and current limitation 50%.   Rehab Potential Good   PT Frequency 2x / week   PT Treatment/Interventions ADLs/Self Care Home Management;Cryotherapy;Electrical Stimulation;Moist Heat;Therapeutic exercise;Ultrasound;Patient/family education;Manual techniques;Dry needling   PT Next Visit Plan MD note for MD appointment 09/19/15.    Consulted and Agree with Plan of Care Patient      Patient will benefit from skilled therapeutic intervention in order to improve the following deficits and impairments:  Decreased range of motion, Pain, Postural dysfunction, Decreased activity tolerance, Decreased mobility  Visit Diagnosis: Pain in thoracic spine     Problem List Patient Active Problem List   Diagnosis Date Noted  . Morbid obesity due to excess calories ([place]) 05/16/2015  . Chronic pain syndrome 05/16/2015  . OSA on CPAP 05/16/2015  . RLS (restless legs syndrome) 07/09/2014  . Paroxysmal atrial fibrillation ([place]) 07/09/2014  . Obesity hypoventilation syndrome ([place]) 07/09/2014  . OSA treated with BiPAP 07/09/2014  . Altered mental state 06/29/2014  . UTI (lower urinary tract infection) 06/29/2014    Class: Acute  . Back pain 06/29/2014    Class: Chronic  . Obstructive sleep apnea hypopnea, severe 06/29/2014    Class: Chronic  . Altered mental status 06/29/2014   Ahmed Prima, PTA 09/17/2015 4:57 PM [place]  MPT [facility] [address] [place], [place], [address] Phone: [phone]   Fax:  [phone]  Name: Carlos Hanson MRN: [id number] Date of Birth: April 22, 1941

## 2015-09-19 ENCOUNTER — Ambulatory Visit: Payer: PPO | Admitting: *Deleted

## 2015-09-19 DIAGNOSIS — M791 Myalgia: Secondary | ICD-10-CM | POA: Diagnosis not present

## 2015-09-19 DIAGNOSIS — M546 Pain in thoracic spine: Secondary | ICD-10-CM

## 2015-09-19 DIAGNOSIS — G894 Chronic pain syndrome: Secondary | ICD-10-CM | POA: Diagnosis not present

## 2015-09-19 DIAGNOSIS — M961 Postlaminectomy syndrome, not elsewhere classified: Secondary | ICD-10-CM | POA: Diagnosis not present

## 2015-09-19 NOTE — Therapy (Signed)
[facility] [place], [place], [address] Phone: [phone]   Fax:  [phone]  Physical Therapy Treatment  Patient Details  Name: Carlos Hanson MRN: [id number] Date of Birth: 1940/07/28 Referring Provider: Laurence Spates, MD  Encounter Date: 09/19/2015      PT End of Session - 09/19/15 1608    Visit Number 6   Number of Visits 10   Date for PT Re-Evaluation 10/08/15   PT Start Time 1608   PT Stop Time 1700   PT Time Calculation (min) 52 min      Past Medical History  Diagnosis Date  . Hypertension   . Gout   . Hypercholesteremia   . History of ulcer disease   . Chronic back pain   . Dysrhythmia     History of atrial fibrillation  . Sleep apnea     cpap sleep study 2011  . Arthritis   . H/O hiatal hernia   . History of bladder infections   . Nocturia   . History of bleeding ulcers   . Hearing decreased     right ear  . Coronary artery disease     40% mLAD/DIAG bifurcation 03/25/10   . Obstructive sleep apnea hypopnea, severe     Past Surgical History  Procedure Laterality Date  . Esophagogastroduodenoscopy    . Left shoulder replacement    . Tonsillectomy    . Back surgery    . Colonoscopy  08/27/2011    Procedure: COLONOSCOPY;  Surgeon: Rogene Houston, MD;  [facility];  Service: Endoscopy;  Laterality: N/A;  930  . Joint replacement      left shoulder  . Cardiac catheterization      2011  . Cardioverson    . Lumbar laminectomy  03/15/2012    Procedure: LUMBAR LAMINECTOMY WITH  X-STOP 2 LEVEL;  Surgeon: Kristeen Miss, MD;  Location: [place] [facility] ORS;  Service: Neurosurgery;  Laterality: N/A;  Lumbar two-three, three-four XSTOP    There were no vitals filed for this visit.      Subjective Assessment - 09/19/15 1606    Subjective Reports that he is having an "iffy" day. Reports not having a lot of strength in back and legs. Doing much better. New Order for LB EVAL   Pertinent History  lumbar laminectomy L2/3, L3/4 and L5/S1, L shoulder replacement, HTN (from chart)   Diagnostic tests xray, mri    Patient Stated Goals decrease pain, return to swimming, stand longer than one hour   Currently in Pain? Yes   Pain Score 2    Pain Location Back   Pain Orientation Left   Pain Descriptors / Indicators Sharp   Pain Type Acute pain   Pain Onset More than a month ago   Pain Frequency Intermittent                         [facility] Adult PT Treatment/Exercise - 09/19/15 0001    Modalities   Modalities Electrical Stimulation;Moist Heat;Ultrasound   Moist Heat Therapy   Number Minutes Moist Heat 15 Minutes   Moist Heat Location Other (comment)  Thoracic spine in sitting   Electrical Stimulation   Electrical Stimulation Location L lower traps/paraspinals; premod 80-150 Hz to tolerance x 20 min in sitting   Electrical Stimulation Goals Pain;Tone   Ultrasound   Ultrasound Location LT thoracic paras   Ultrasound Parameters 1.5 w/cm2 x 10 mins   Ultrasound Goals Pain  Manual Therapy   Manual Therapy Myofascial release   Myofascial Release MFR/STW to L thoracic paraspinals in prone to decrease tightness and pain reported by patient                      PT Long Term Goals - 09/17/15 1420    PT LONG TERM GOAL #1   Title I with HEP   Time 5   Period Weeks   Status On-going   PT LONG TERM GOAL #2   Title No pain in thoracic spine with ADLS   Time 5   Period Weeks   Status On-going  Continues to report pain and weakness 09/17/2015   PT LONG TERM GOAL #3   Title Patient able to perform sit <> stand without thoracic spine pain   Time 5   Status Achieved               Plan - 09/19/15 1713    Clinical Impression Statement Pt did great today and has improved with less pain. He mainly has pain with certain movements, but pain doesn't last.unable to meet LTG yet due to still having pain with ADL"s.   Rehab Potential Good   PT Frequency 2x /  week   PT Treatment/Interventions ADLs/Self Care Home Management;Cryotherapy;Electrical Stimulation;Moist Heat;Therapeutic exercise;Ultrasound;Patient/family education;Manual techniques;Dry needling   PT Next Visit Plan MD order to start PT on LB. Give HEP for Thoracic and then DC PT for Thoracic?   Consulted and Agree with Plan of Care Patient      Patient will benefit from skilled therapeutic intervention in order to improve the following deficits and impairments:  Decreased range of motion, Pain, Postural dysfunction, Decreased activity tolerance, Decreased mobility  Visit Diagnosis: Pain in thoracic spine     Problem List Patient Active Problem List   Diagnosis Date Noted  . Morbid obesity due to excess calories ([place]) 05/16/2015  . Chronic pain syndrome 05/16/2015  . OSA on CPAP 05/16/2015  . RLS (restless legs syndrome) 07/09/2014  . Paroxysmal atrial fibrillation ([place]) 07/09/2014  . Obesity hypoventilation syndrome ([place]) 07/09/2014  . OSA treated with BiPAP 07/09/2014  . Altered mental state 06/29/2014  . UTI (lower urinary tract infection) 06/29/2014    Class: Acute  . Back pain 06/29/2014    Class: Chronic  . Obstructive sleep apnea hypopnea, severe 06/29/2014    Class: Chronic  . Altered mental status 06/29/2014    Selby Slovacek,CHRIS,PTA 09/19/2015, 5:42 PM  [facility] [place], [place], [address] Phone: [phone]   Fax:  [phone]  Name: Carlos Hanson MRN: [id number] Date of Birth: 02/09/1941

## 2015-09-20 DIAGNOSIS — G4733 Obstructive sleep apnea (adult) (pediatric): Secondary | ICD-10-CM | POA: Diagnosis not present

## 2015-09-23 ENCOUNTER — Ambulatory Visit: Payer: PPO | Admitting: Physical Therapy

## 2015-09-23 DIAGNOSIS — M546 Pain in thoracic spine: Secondary | ICD-10-CM | POA: Diagnosis not present

## 2015-09-23 NOTE — Therapy (Signed)
[facility] [place], [place], [address] Phone: [phone]   Fax:  [phone]  Physical Therapy Treatment  Patient Details  Name: Carlos Hanson MRN: [id number] Date of Birth: [birth year] Referring Provider: Laurence Spates, MD  Encounter Date: 09/23/2015      PT End of Session - 09/23/15 1602    Visit Number 7   Number of Visits 10   Date for PT Re-Evaluation 10/08/15   PT Start Time 0230   PT Stop Time 0318   PT Time Calculation (min) 48 min   Activity Tolerance Patient tolerated treatment well   Behavior During Therapy [facility] for tasks assessed/performed      Past Medical History  Diagnosis Date  . Hypertension   . Gout   . Hypercholesteremia   . History of ulcer disease   . Chronic back pain   . Dysrhythmia     History of atrial fibrillation  . Sleep apnea     cpap sleep study 2011  . Arthritis   . H/O hiatal hernia   . History of bladder infections   . Nocturia   . History of bleeding ulcers   . Hearing decreased     right ear  . Coronary artery disease     40% mLAD/DIAG bifurcation 03/25/10   . Obstructive sleep apnea hypopnea, severe     Past Surgical History  Procedure Laterality Date  . Esophagogastroduodenoscopy    . Left shoulder replacement    . Tonsillectomy    . Back surgery    . Colonoscopy  08/27/2011    Procedure: COLONOSCOPY;  Surgeon: Rogene Houston, MD;  [facility];  Service: Endoscopy;  Laterality: N/A;  930  . Joint replacement      left shoulder  . Cardiac catheterization      2011  . Cardioverson    . Lumbar laminectomy  03/15/2012    Procedure: LUMBAR LAMINECTOMY WITH  X-STOP 2 LEVEL;  Surgeon: Kristeen Miss, MD;  Location: [place] NEUR[facility] ORS;  Service: Neurosurgery;  Laterality: N/A;  Lumbar two-three, three-four XSTOP    There were no vitals filed for this visit.      Subjective Assessment - 09/23/15 1603    Subjective The patient is very pleased with how his back is  doing.  He would like to be discharged for this following his Thursday treatment and then be seen next week for his lumbar spine and try dry needling.   Diagnostic tests xray, mri    Patient Stated Goals decrease pain, return to swimming, stand longer than one hour   Pain Score 2    Pain Location Back   Pain Orientation Left   Pain Descriptors / Indicators Dull   Pain Type Acute pain   Pain Onset More than a month ago                         [facility] Adult PT Treatment/Exercise - 09/23/15 0001    Modalities   Modalities Electrical Stimulation   Moist Heat Therapy   Number Minutes Moist Heat 15 Minutes   Moist Heat Location --  Left affected thoracic spine.   Acupuncturist Location --  Left affected thoracic spine.   Electrical Stimulation Action Pre-mod   Electrical Stimulation Parameters 80-150 Hz x 15 minutes.   Electrical Stimulation Goals Pain   Ultrasound   Ultrasound Location In prone:  Left affected thoracic region.  Ultrasound Parameters 1.50 W/CM2 x 12 minutes.   Ultrasound Goals Pain   Manual Therapy   Manual therapy comments STW/M, AP mobs and costo-vertebral mobs to left affected thoracic spine x 11 minutes.                     PT Long Term Goals - 09/17/15 1420    PT LONG TERM GOAL #1   Title I with HEP   Time 5   Period Weeks   Status On-going   PT LONG TERM GOAL #2   Title No pain in thoracic spine with ADLS   Time 5   Period Weeks   Status On-going  Continues to report pain and weakness 09/17/2015   PT LONG TERM GOAL #3   Title Patient able to perform sit <> stand without thoracic spine pain   Time 5   Status Achieved             Patient will benefit from skilled therapeutic intervention in order to improve the following deficits and impairments:     Visit Diagnosis: Pain in thoracic spine     Problem List Patient Active Problem List   Diagnosis Date Noted  . Morbid  obesity due to excess calories ([place]) 05/16/2015  . Chronic pain syndrome 05/16/2015  . OSA on CPAP 05/16/2015  . RLS (restless legs syndrome) 07/09/2014  . Paroxysmal atrial fibrillation ([place]) 07/09/2014  . Obesity hypoventilation syndrome ([place]) 07/09/2014  . OSA treated with BiPAP 07/09/2014  . Altered mental state 06/29/2014  . UTI (lower urinary tract infection) 06/29/2014    Class: Acute  . Back pain 06/29/2014    Class: Chronic  . Obstructive sleep apnea hypopnea, severe 06/29/2014    Class: Chronic  . Altered mental status 06/29/2014    APPLEG[place], [place] MPT 09/23/2015, 4:08 PM  [facility] [address] [place], [place], [address] Phone: [phone]   Fax:  [phone]  Name: Carlos Hanson MRN: [id number] Date of Birth: 1941/01/01

## 2015-09-26 ENCOUNTER — Ambulatory Visit: Payer: PPO | Admitting: *Deleted

## 2015-09-26 DIAGNOSIS — M546 Pain in thoracic spine: Secondary | ICD-10-CM | POA: Diagnosis not present

## 2015-09-26 NOTE — Therapy (Signed)
[facility] [place], [place], [address] Phone: [phone]   Fax:  [phone]  Physical Therapy Treatment  Patient Details  Name: Carlos Hanson MRN: [id number] Date of Birth: Oct 24, 1940 Referring Provider: Laurence Spates, MD  Encounter Date: 09/26/2015      PT End of Session - 09/26/15 1525    Visit Number 8   Number of Visits 10   Date for PT Re-Evaluation 10/08/15   PT Start Time 1791   PT Stop Time 1605   PT Time Calculation (min) 50 min      Past Medical History  Diagnosis Date  . Hypertension   . Gout   . Hypercholesteremia   . History of ulcer disease   . Chronic back pain   . Dysrhythmia     History of atrial fibrillation  . Sleep apnea     cpap sleep study 2011  . Arthritis   . H/O hiatal hernia   . History of bladder infections   . Nocturia   . History of bleeding ulcers   . Hearing decreased     right ear  . Coronary artery disease     40% mLAD/DIAG bifurcation 03/25/10   . Obstructive sleep apnea hypopnea, severe     Past Surgical History  Procedure Laterality Date  . Esophagogastroduodenoscopy    . Left shoulder replacement    . Tonsillectomy    . Back surgery    . Colonoscopy  08/27/2011    Procedure: COLONOSCOPY;  Surgeon: Rogene Houston, MD;  [facility];  Service: Endoscopy;  Laterality: N/A;  930  . Joint replacement      left shoulder  . Cardiac catheterization      2011  . Cardioverson    . Lumbar laminectomy  03/15/2012    Procedure: LUMBAR LAMINECTOMY WITH  X-STOP 2 LEVEL;  Surgeon: Kristeen Miss, MD;  Location: [place] [facility];  Service: Neurosurgery;  Laterality: N/A;  Lumbar two-three, three-four XSTOP    There were no vitals filed for this visit.      Subjective Assessment - 09/26/15 1524    Subjective The patient is very pleased with how his back is doing.  He would like to be discharged for this following his Thursday treatment and then be seen next week for  his lumbar spine and try dry needling.   Pertinent History lumbar laminectomy L2/3, L3/4 and L5/S1, L shoulder replacement, HTN (from chart)   Diagnostic tests xray, mri    Patient Stated Goals decrease pain, return to swimming, stand longer than one hour   Currently in Pain? Yes   Pain Score 2    Pain Location Back   Pain Orientation Left   Pain Descriptors / Indicators Dull   Pain Type Acute pain   Pain Onset More than a month ago   Pain Frequency Intermittent                         [facility] Adult PT Treatment/Exercise - 09/26/15 0001    Modalities   Modalities Electrical Stimulation   Moist Heat Therapy   Number Minutes Moist Heat 15 Minutes   Moist Heat Location Other (comment)   Electrical Stimulation   Electrical Stimulation Location L lower traps/paraspinals; premod 80-150 Hz to tolerance x 20 min in sitting   Electrical Stimulation Goals Pain   Ultrasound   Ultrasound Location In prone   Ultrasound Parameters 1.5 w/cm2 x 10 mins  Ultrasound Goals Pain   Manual Therapy   Manual Therapy Myofascial release   Myofascial Release MFR/STW to L thoracic paraspinals in prone to decrease tightness and pain reported by patient                      PT Long Term Goals - 10/11/15 1609    PT LONG TERM GOAL #1   Title I with HEP   Time 5   Period Weeks   Status Achieved   PT LONG TERM GOAL #2   Title No pain in thoracic spine with ADLS   Time 5   Period Weeks   Status Partially Met  Just a few ADLs still cause pain   PT LONG TERM GOAL #3   Title Patient able to perform sit <> stand without thoracic spine pain   Time 5   Period Weeks   Status Achieved               Plan - 2015-10-11 1610    Clinical Impression Statement Pt was able to meet all LTGs except LTG for ADLs due to still having some pain with ADLs. Pt will now be evaluated for LB.   Rehab Potential Good   PT Frequency 2x / week   PT Next Visit Plan MD order to start PT on LB.   DC thoracic PT      Patient will benefit from skilled therapeutic intervention in order to improve the following deficits and impairments:  Decreased range of motion, Pain, Postural dysfunction, Decreased activity tolerance, Decreased mobility  Visit Diagnosis: Pain in thoracic spine       G-Codes - 11-Oct-2015 1602    Functional Assessment Tool Used FOTO DC 26% limitation      Problem List Patient Active Problem List   Diagnosis Date Noted  . Morbid obesity due to excess calories ([place]) 05/16/2015  . Chronic pain syndrome 05/16/2015  . OSA on CPAP 05/16/2015  . RLS (restless legs syndrome) 07/09/2014  . Paroxysmal atrial fibrillation ([place]) 07/09/2014  . Obesity hypoventilation syndrome ([place]) 07/09/2014  . OSA treated with BiPAP 07/09/2014  . Altered mental state 06/29/2014  . UTI (lower urinary tract infection) 06/29/2014    Class: Acute  . Back pain 06/29/2014    Class: Chronic  . Obstructive sleep apnea hypopnea, severe 06/29/2014    Class: Chronic  . Altered mental status 06/29/2014    RAMSEUR,CHRIS,PTA October 11, 2015, 4:38 PM  [facility] [address] [place], [place], [address] Phone: [phone]   Fax:  [phone]  Name: Carlos Hanson MRN: [id number] Date of Birth: 10-04-1940

## 2015-09-26 NOTE — Therapy (Signed)
[facility] [place], [place], [address] Phone: [phone]   Fax:  [phone]  Physical Therapy Treatment  Patient Details  Name: Carlos Hanson MRN: [id number] Date of Birth: Oct 24, 1940 Referring Provider: Laurence Spates, MD  Encounter Date: 09/26/2015      PT End of Session - 09/26/15 1525    Visit Number 8   Number of Visits 10   Date for PT Re-Evaluation 10/08/15   PT Start Time 1791   PT Stop Time 1605   PT Time Calculation (min) 50 min      Past Medical History  Diagnosis Date  . Hypertension   . Gout   . Hypercholesteremia   . History of ulcer disease   . Chronic back pain   . Dysrhythmia     History of atrial fibrillation  . Sleep apnea     cpap sleep study 2011  . Arthritis   . H/O hiatal hernia   . History of bladder infections   . Nocturia   . History of bleeding ulcers   . Hearing decreased     right ear  . Coronary artery disease     40% mLAD/DIAG bifurcation 03/25/10   . Obstructive sleep apnea hypopnea, severe     Past Surgical History  Procedure Laterality Date  . Esophagogastroduodenoscopy    . Left shoulder replacement    . Tonsillectomy    . Back surgery    . Colonoscopy  08/27/2011    Procedure: COLONOSCOPY;  Surgeon: Rogene Houston, MD;  [facility];  Service: Endoscopy;  Laterality: N/A;  930  . Joint replacement      left shoulder  . Cardiac catheterization      2011  . Cardioverson    . Lumbar laminectomy  03/15/2012    Procedure: LUMBAR LAMINECTOMY WITH  X-STOP 2 LEVEL;  Surgeon: Kristeen Miss, MD;  Location: [place] [facility];  Service: Neurosurgery;  Laterality: N/A;  Lumbar two-three, three-four XSTOP    There were no vitals filed for this visit.      Subjective Assessment - 09/26/15 1524    Subjective The patient is very pleased with how his back is doing.  He would like to be discharged for this following his Thursday treatment and then be seen next week for  his lumbar spine and try dry needling.   Pertinent History lumbar laminectomy L2/3, L3/4 and L5/S1, L shoulder replacement, HTN (from chart)   Diagnostic tests xray, mri    Patient Stated Goals decrease pain, return to swimming, stand longer than one hour   Currently in Pain? Yes   Pain Score 2    Pain Location Back   Pain Orientation Left   Pain Descriptors / Indicators Dull   Pain Type Acute pain   Pain Onset More than a month ago   Pain Frequency Intermittent                         [facility] Adult PT Treatment/Exercise - 09/26/15 0001    Modalities   Modalities Electrical Stimulation   Moist Heat Therapy   Number Minutes Moist Heat 15 Minutes   Moist Heat Location Other (comment)   Electrical Stimulation   Electrical Stimulation Location L lower traps/paraspinals; premod 80-150 Hz to tolerance x 20 min in sitting   Electrical Stimulation Goals Pain   Ultrasound   Ultrasound Location In prone   Ultrasound Parameters 1.5 w/cm2 x 10 mins  Ultrasound Goals Pain   Manual Therapy   Manual Therapy Myofascial release   Myofascial Release MFR/STW to L thoracic paraspinals in prone to decrease tightness and pain reported by patient                      PT Long Term Goals - 10-20-2015 1609    PT LONG TERM GOAL #1   Title I with HEP   Time 5   Period Weeks   Status Achieved   PT LONG TERM GOAL #2   Title No pain in thoracic spine with ADLS   Time 5   Period Weeks   Status Partially Met  Just a few ADLs still cause pain   PT LONG TERM GOAL #3   Title Patient able to perform sit <> stand without thoracic spine pain   Time 5   Period Weeks   Status Achieved               Plan - 2015-10-20 1610    Clinical Impression Statement Pt was able to meet all LTGs except LTG for ADLs due to still having some pain with ADLs. Pt will now be evaluated for LB.   Rehab Potential Good   PT Frequency 2x / week   PT Next Visit Plan MD order to start PT on LB.   DC thoracic PT      Patient will benefit from skilled therapeutic intervention in order to improve the following deficits and impairments:  Decreased range of motion, Pain, Postural dysfunction, Decreased activity tolerance, Decreased mobility  Visit Diagnosis: Pain in thoracic spine       G-Codes - 2015/10/20 1602    Functional Assessment Tool Used FOTO DC 26% limitation   Functional Limitation Mobility: Walking and moving around   Mobility: Walking and Moving Around Current Status [phone]) At least 20 percent but less than 40 percent impaired, limited or restricted   Mobility: Walking and Moving Around Goal Status [phone]) At least 40 percent but less than 60 percent impaired, limited or restricted   Mobility: Walking and Moving Around Discharge Status [phone]) At least 20 percent but less than 40 percent impaired, limited or restricted      Problem List Patient Active Problem List   Diagnosis Date Noted  . Morbid obesity due to excess calories ([place]) 05/16/2015  . Chronic pain syndrome 05/16/2015  . OSA on CPAP 05/16/2015  . RLS (restless legs syndrome) 07/09/2014  . Paroxysmal atrial fibrillation ([place]) 07/09/2014  . Obesity hypoventilation syndrome ([place]) 07/09/2014  . OSA treated with BiPAP 07/09/2014  . Altered mental state 06/29/2014  . UTI (lower urinary tract infection) 06/29/2014    Class: Acute  . Back pain 06/29/2014    Class: Chronic  . Obstructive sleep apnea hypopnea, severe 06/29/2014    Class: Chronic  . Altered mental status 06/29/2014  PHYSICAL THERAPY DISCHARGE SUMMARY   Visits from Start of Care: 8  Current functional level related to goals / functional outcomes: Please see above.   Remaining deficits: Goals essentially met.   Education / Equipment: HEP.  Plan: Patient agrees to discharge.  Patient goals were met. Patient is being discharged due to being pleased with the current functional level.  ?????      [place], [place] MPT 20-Oct-2015, 5:01  PM  [facility] [address] [place], [place], [address] Phone: [phone]   Fax:  [phone]  Name: Carlos Hanson MRN: [id number] Date of Birth: 05-Oct-1940

## 2015-10-01 ENCOUNTER — Ambulatory Visit: Payer: PPO | Admitting: Physical Therapy

## 2015-10-01 DIAGNOSIS — M545 Low back pain, unspecified: Secondary | ICD-10-CM

## 2015-10-01 DIAGNOSIS — M546 Pain in thoracic spine: Secondary | ICD-10-CM | POA: Diagnosis not present

## 2015-10-01 NOTE — Therapy (Addendum)
[facility] [place], [place], [address] Phone: [phone]   Fax:  [phone]  Physical Therapy Treatment  Patient Details  Name: Carlos Hanson MRN: [id number] Date of Birth: May 10, 1940 Referring Provider: Laurence Spates, MD  Encounter Date: 10/01/2015      PT End of Session - 10/01/15 0818    Visit Number 9   Number of Visits 17   Date for PT Re-Evaluation 11/12/15   PT Start Time 0818   PT Stop Time 0919   PT Time Calculation (min) 61 min   Activity Tolerance Patient tolerated treatment well   Behavior During Therapy [facility] for tasks assessed/performed      Past Medical History  Diagnosis Date  . Hypertension   . Gout   . Hypercholesteremia   . History of ulcer disease   . Chronic back pain   . Dysrhythmia     History of atrial fibrillation  . Sleep apnea     cpap sleep study 2011  . Arthritis   . H/O hiatal hernia   . History of bladder infections   . Nocturia   . History of bleeding ulcers   . Hearing decreased     right ear  . Coronary artery disease     40% mLAD/DIAG bifurcation 03/25/10   . Obstructive sleep apnea hypopnea, severe     Past Surgical History  Procedure Laterality Date  . Esophagogastroduodenoscopy    . Left shoulder replacement    . Tonsillectomy    . Back surgery    . Colonoscopy  08/27/2011    Procedure: COLONOSCOPY;  Surgeon: Rogene Houston, MD;  [facility];  Service: Endoscopy;  Laterality: N/A;  930  . Joint replacement      left shoulder  . Cardiac catheterization      2011  . Cardioverson    . Lumbar laminectomy  03/15/2012    Procedure: LUMBAR LAMINECTOMY WITH  X-STOP 2 LEVEL;  Surgeon: Kristeen Miss, MD;  Location: [place] [facility];  Service: Neurosurgery;  Laterality: N/A;  Lumbar two-three, three-four XSTOP    There were no vitals filed for this visit.      Subjective Assessment - 10/01/15 0818    Subjective Patient reports no pain in the upper back.  Patient reports walking and standing are what "kills" him. About six years ago he moved a chest of drawers and that's when it stared. He had the xstop at two levels in 2013. Patient fell a few weeks ago onto R side. He stumbled backward after moving a chair.   How long can you walk comfortably? <200 yards   Patient Stated Goals decrease pain   Currently in Pain? Yes   Pain Score 9    Pain Location Back   Pain Orientation Lower;Right;Left   Pain Descriptors / Indicators Dull   Pain Type Chronic pain   Pain Radiating Towards R hip   Pain Onset More than a month ago   Pain Frequency Intermittent   Aggravating Factors  standing and walking   Pain Relieving Factors sitting or lying (relieves in 30-60 seconds)   Effect of Pain on Daily Activities limited with ADLS cooking etc.            [facility] PT Assessment - 10/01/15 0001    Assessment   Medical Diagnosis B lumbar spondylosis; Chronic pain, Myofascial pain; Thoracic pain   Onset Date/Surgical Date 09/01/09   Precautions   Precautions None   Balance Screen  Has the patient fallen in the past 6 months Yes   How many times? 1   Has the patient had a decrease in activity level because of a fear of falling?  No   Is the patient reluctant to leave their home because of a fear of falling?  No   Prior Function   Level of Independence Independent   ROM / Strength   AROM / PROM / Strength Strength   AROM   AROM Assessment Site Lumbar   Lumbar Flexion 50%   Lumbar Extension WFL   Lumbar - Right Side Bend 50%   Lumbar - Left Side Bend 25%   Strength   Overall Strength Comments B hip flex 4+/5, R knee ext 5-/5, else B knees and ankle DF 5/5. Functional weakness with sit to stand.   Flexibility   Soft Tissue Assessment /Muscle Length --  Tight B hamstrings and quads.   Palpation   Palpation comment marked tenderness and increased tone of B lumbar paraspinals, R QL and glut medius   Special Tests    Special Tests Lumbar   Lumbar Tests  Prone Knee Bend Test;Straight Leg Raise   Prone Knee Bend Test   Findings Negative   Side Right   Comment Bil   Straight Leg Raise   Findings Negative   Side  Right   Comment Bil                     [facility] Adult PT Treatment/Exercise - 10/01/15 0001    Modalities   Modalities Electrical Stimulation;Moist Heat   Moist Heat Therapy   Number Minutes Moist Heat 15 Minutes   Moist Heat Location Other (comment)   Electrical Stimulation   Electrical Stimulation Location B lumbar paraspinals   Electrical Stimulation Action premod   Electrical Stimulation Parameters 80-150 Hz to tolerance x 15 min   Electrical Stimulation Goals Pain   Manual Therapy   Manual Therapy Myofascial release;Soft tissue mobilization   Manual therapy comments STW/MFR to B paraspinals and R QL/glut med          Trigger Point Dry Needling - 10/01/15 1055    Consent Given? Yes   Education Handout Provided No   Muscles Treated Lower Body Gluteus maximus  Lumbar B  MF L4/5/S1, R QL and glut med   Gluteus Maximus Response Twitch response elicited;Palpable increased muscle length              PT Education - 10/01/15 1109    Education provided Yes   Education Details HEP - SDLY QL stretch demonstrated as well as seated HS stretch.   Person(s) Educated Patient   Methods Explanation;Demonstration   Comprehension Verbalized understanding             PT Long Term Goals - 10/01/15 1237    PT LONG TERM GOAL #1   Title I with HEP   Time 5   Period Weeks   Status Achieved   PT LONG TERM GOAL #2   Title No pain in thoracic spine with ADLS   Time 5   Period Weeks   Status Achieved   PT LONG TERM GOAL #3   Title Patient able to perform sit <> stand without lumbar spine pain   Time 4   Period Weeks   Status Revised   PT LONG TERM GOAL #4   Title Improved pain with standing and walking by 50%.   Time 4   Period Weeks  Status New               Plan - 10/01/15 1112     Clinical Impression Statement Patient was reevaluated to add low back to treatment plan. He has low back pain, decreased lumbar ROM, marked tightness in BLE and functional weakness with transfers. He responded well to DN to lumbar area.    Rehab Potential Good   PT Frequency 2x / week   PT Duration 4 weeks   PT Treatment/Interventions ADLs/Self Care Home Management;Cryotherapy;Electrical Stimulation;Moist Heat;Therapeutic exercise;Ultrasound;Patient/family education;Manual techniques;Dry needling;Passive range of motion   PT Next Visit Plan Gcode for 10th visit (d/c Tspine and add Lumbar); Assess DN and continue as indicated. Continue deep STW to lumbar paraspinals/QL; work on BLE flexibility and functional strengthening.   PT Home Exercise Plan heat to needled area, QL stretch, HS stretch   Consulted and Agree with Plan of Care Patient      Patient will benefit from skilled therapeutic intervention in order to improve the following deficits and impairments:  Decreased range of motion, Pain, Postural dysfunction, Decreased activity tolerance, Decreased mobility, Impaired flexibility, Decreased strength  Visit Diagnosis: Bilateral low back pain without sciatica - Plan: PT plan of care cert/re-cert  Gcode completed: Clinical Judgement for Mobility (lumbar):  Current status: 60-80% impaired; Goal status: 40-60% impaired.   Problem List Patient Active Problem List   Diagnosis Date Noted  . Morbid obesity due to excess calories ([place]) 05/16/2015  . Chronic pain syndrome 05/16/2015  . OSA on CPAP 05/16/2015  . RLS (restless legs syndrome) 07/09/2014  . Paroxysmal atrial fibrillation ([place]) 07/09/2014  . Obesity hypoventilation syndrome ([place]) 07/09/2014  . OSA treated with BiPAP 07/09/2014  . Altered mental state 06/29/2014  . UTI (lower urinary tract infection) 06/29/2014    Class: Acute  . Back pain 06/29/2014    Class: Chronic  . Obstructive sleep apnea hypopnea, severe 06/29/2014     Class: Chronic  . Altered mental status 06/29/2014    [name] PT  10/01/2015, 1:16 PM  [facility] [address] [place], [place], [address] Phone: [phone]   Fax:  [phone]  Name: Carlos Hanson MRN: [id number] Date of Birth: April 03, 1941

## 2015-10-08 ENCOUNTER — Ambulatory Visit: Payer: PPO | Attending: Physical Medicine and Rehabilitation | Admitting: Physical Therapy

## 2015-10-08 DIAGNOSIS — M546 Pain in thoracic spine: Secondary | ICD-10-CM | POA: Diagnosis not present

## 2015-10-08 DIAGNOSIS — M545 Low back pain, unspecified: Secondary | ICD-10-CM

## 2015-10-08 NOTE — Patient Instructions (Signed)
  HIP: Flexors - Supine   Lie on edge of surface. Place leg off the surface, allow knee to bend. Bring other knee toward chest (or keep knee bent). Hold _60__ seconds. _3__ reps per set, _2-3__ times per day, Rest lowered foot on stool if needed.  In the pool, do this in standing with front foot on a step:      Stand with front foot on step. Keeping trunk straight and contracting abdominal muscles, slowly shift weight forward. Continue breathing normally and hold position for 60 seconds. Repeat on other leg. Alternate sides _3__ times. Do _2-3__ times per day.     Lower Trunk Rotation Stretch   Keeping back flat and feet together, rotate knees to left side. Hold __10__ seconds. Repeat __5__ times per set. Do ____ sets per session. Do _2_ sessions per day.   Straight Leg Calf Stretch (Gastroc)   Put palms against wall, one leg forward and bent. With other leg back straight and heel flat on floor, lean into wall. Hold _30__ seconds. Change legs and repeat. Repeat _3___ times. Do __2__ sessions per day.  [name], PT 10/08/2015 3:30 PM [facility] [place], [place], [address] Phone: [phone]   Fax:  [phone]

## 2015-10-08 NOTE — Therapy (Signed)
[facility] [place], [place], [address] Phone: [phone]   Fax:  [phone]  Physical Therapy Treatment  Patient Details  Name: Carlos Hanson MRN: [id number] Date of Birth: February 20, 1941 Referring Provider: Laurence Spates, MD  Encounter Date: 10/08/2015      PT End of Session - 10/08/15 1435    Visit Number 10   Number of Visits 17   Date for PT Re-Evaluation 11/12/15   PT Start Time [id number]   PT Stop Time 1538   PT Time Calculation (min) 64 min   Activity Tolerance Patient tolerated treatment well   Behavior During Therapy [facility] for tasks assessed/performed      Past Medical History  Diagnosis Date  . Hypertension   . Gout   . Hypercholesteremia   . History of ulcer disease   . Chronic back pain   . Dysrhythmia     History of atrial fibrillation  . Sleep apnea     cpap sleep study 2011  . Arthritis   . H/O hiatal hernia   . History of bladder infections   . Nocturia   . History of bleeding ulcers   . Hearing decreased     right ear  . Coronary artery disease     40% mLAD/DIAG bifurcation 03/25/10   . Obstructive sleep apnea hypopnea, severe     Past Surgical History  Procedure Laterality Date  . Esophagogastroduodenoscopy    . Left shoulder replacement    . Tonsillectomy    . Back surgery    . Colonoscopy  08/27/2011    Procedure: COLONOSCOPY;  Surgeon: Rogene Houston, MD;  [facility];  Service: Endoscopy;  Laterality: N/A;  930  . Joint replacement      left shoulder  . Cardiac catheterization      2011  . Cardioverson    . Lumbar laminectomy  03/15/2012    Procedure: LUMBAR LAMINECTOMY WITH  X-STOP 2 LEVEL;  Surgeon: Kristeen Miss, MD;  Location: [place] [facility];  Service: Neurosurgery;  Laterality: N/A;  Lumbar two-three, three-four XSTOP    There were no vitals filed for this visit.      Subjective Assessment - 10/08/15 1436    Subjective Paitent reports improvement from needling. He  was sore for the two days after, but then by Saturday he felt like he didn't have any pain. He feels he could've brought it on if he tried with certain activities. He has had no pain today other than a couple of twinges.   Pertinent History lumbar laminectomy L2/3, L3/4 and L5/S1, L shoulder replacement, HTN (from chart)   How long can you walk comfortably? <200 yards   Diagnostic tests xray, mri    Patient Stated Goals decrease pain   Currently in Pain? No/denies  5/10 when he gets it.            [facility] PT Assessment - 10/08/15 0001    Observation/Other Assessments   Focus on Therapeutic Outcomes (FOTO)  Clinical judgement (lumbar) 60-80% impaired                     [facility] Adult PT Treatment/Exercise - 10/08/15 0001    Exercises   Exercises Lumbar;Knee/Hip   Lumbar Exercises: Stretches   Active Hamstring Stretch --  alternate rotation stretches demo'd to patient   Hip Flexor Stretch 2 reps;60 seconds   Hip Flexor Stretch Limitations also standing with foot on 6 inch step for use in  pool; also gastroc/soleus stretch in standing   Modalities   Modalities Electrical Stimulation;Moist Heat   Moist Heat Therapy   Number Minutes Moist Heat 15 Minutes   Moist Heat Location Lumbar Spine   Electrical Stimulation   Electrical Stimulation Location B lumbar paraspinals   Electrical Stimulation Action premod   Electrical Stimulation Parameters 80-150 hz to tolerance x 15 min   Electrical Stimulation Goals Pain   Manual Therapy   Manual Therapy Myofascial release;Soft tissue mobilization   Manual therapy comments B lumbar paraspinals and QL          Trigger Point Dry Needling - 2015/10/09 1509    Consent Given? Yes   Education Handout Provided No   Muscles Treated Lower Body --  lumbar multifidusL3/4/5 B   Gluteus Maximus Response Twitch response elicited;Palpable increased muscle length              PT Education - 09-Oct-2015 1531    Education provided Yes    Education Details HEP   Person(s) Educated Patient   Methods Explanation;Demonstration;Handout   Comprehension Verbalized understanding;Returned demonstration             PT Long Term Goals - 10/01/15 1237    PT LONG TERM GOAL #1   Title I with HEP   Time 5   Period Weeks   Status Achieved   PT LONG TERM GOAL #2   Title No pain in thoracic spine with ADLS   Time 5   Period Weeks   Status Achieved   PT LONG TERM GOAL #3   Title Patient able to perform sit <> stand without lumbar spine pain   Time 4   Period Weeks   Status Revised   PT LONG TERM GOAL #4   Title Improved pain with standing and walking by 50%.   Time 4   Period Weeks   Status New               Plan - 09-Oct-2015 1533    Clinical Impression Statement Patient reports significant improvement in LBP after first round of DN reporting no pain with sit to stand. He did well again with DN today to lumbar multifidus. He has marked R calf tightness and c/o R heel pain so PT issued calf stretch in addition to hip and lumbar stretches. Patient reports wanting to return to golf, so discussed slow progression utilizing lumbar rotation stretches and swinging without club intiially. LTGs are ongoing.   Rehab Potential Good   PT Frequency 2x / week   PT Duration 4 weeks   PT Treatment/Interventions ADLs/Self Care Home Management;Cryotherapy;Electrical Stimulation;Moist Heat;Therapeutic exercise;Ultrasound;Patient/family education;Manual techniques;Dry needling;Passive range of motion   PT Next Visit Plan Assess DN. Continue as indicated. Continue STW, flexibility and add core and LE strengthening.   Consulted and Agree with Plan of Care Patient      Patient will benefit from skilled therapeutic intervention in order to improve the following deficits and impairments:  Decreased range of motion, Pain, Postural dysfunction, Decreased activity tolerance, Decreased mobility, Impaired flexibility, Decreased strength  Visit  Diagnosis: Bilateral low back pain without sciatica       G-Codes - October 09, 2015 1548    Functional Assessment Tool Used Clinical Judgement (Lumbar)   Functional Limitation Mobility: Walking and moving around   Mobility: Walking and Moving Around Current Status [id number]) At least 60 percent but less than 80 percent impaired, limited or restricted   Mobility: Walking and Moving Around Goal Status [id number]) At least  40 percent but less than 60 percent impaired, limited or restricted   Mobility: Walking and Moving Around Discharge Status [phone]) --      Problem List Patient Active Problem List   Diagnosis Date Noted  . Morbid obesity due to excess calories ([place]) 05/16/2015  . Chronic pain syndrome 05/16/2015  . OSA on CPAP 05/16/2015  . RLS (restless legs syndrome) 07/09/2014  . Paroxysmal atrial fibrillation ([place]) 07/09/2014  . Obesity hypoventilation syndrome ([place]) 07/09/2014  . OSA treated with BiPAP 07/09/2014  . Altered mental state 06/29/2014  . UTI (lower urinary tract infection) 06/29/2014    Class: Acute  . Back pain 06/29/2014    Class: Chronic  . Obstructive sleep apnea hypopnea, severe 06/29/2014    Class: Chronic  . Altered mental status 06/29/2014    [name] PT  10/08/2015, 4:02 PM  [facility] [address] [place], [place], [address] Phone: [phone]   Fax:  [phone]  Name: Carlos Hanson MRN: [id number] Date of Birth: 01-27-1941

## 2015-10-10 ENCOUNTER — Ambulatory Visit: Payer: PPO | Admitting: *Deleted

## 2015-10-10 DIAGNOSIS — M545 Low back pain, unspecified: Secondary | ICD-10-CM

## 2015-10-10 DIAGNOSIS — M546 Pain in thoracic spine: Secondary | ICD-10-CM

## 2015-10-10 NOTE — Therapy (Signed)
[facility] [place], [place], [address] Phone: [phone]   Fax:  [phone]  Physical Therapy Treatment  Patient Details  Name: Carlos Hanson MRN: [id number] Date of Birth: 1941-01-17 Referring Provider: Laurence Spates, MD  Encounter Date: 10/10/2015      PT End of Session - 10/10/15 1551    Visit Number 11   Number of Visits 17   Date for PT Re-Evaluation 11/12/15   PT Start Time [id number]   PT Stop Time 1521   PT Time Calculation (min) 48 min      Past Medical History  Diagnosis Date  . Hypertension   . Gout   . Hypercholesteremia   . History of ulcer disease   . Chronic back pain   . Dysrhythmia     History of atrial fibrillation  . Sleep apnea     cpap sleep study 2011  . Arthritis   . H/O hiatal hernia   . History of bladder infections   . Nocturia   . History of bleeding ulcers   . Hearing decreased     right ear  . Coronary artery disease     40% mLAD/DIAG bifurcation 03/25/10   . Obstructive sleep apnea hypopnea, severe     Past Surgical History  Procedure Laterality Date  . Esophagogastroduodenoscopy    . Left shoulder replacement    . Tonsillectomy    . Back surgery    . Colonoscopy  08/27/2011    Procedure: COLONOSCOPY;  Surgeon: Rogene Houston, MD;  [facility];  Service: Endoscopy;  Laterality: N/A;  930  . Joint replacement      left shoulder  . Cardiac catheterization      2011  . Cardioverson    . Lumbar laminectomy  03/15/2012    Procedure: LUMBAR LAMINECTOMY WITH  X-STOP 2 LEVEL;  Surgeon: Kristeen Miss, MD;  Location: [place] [facility];  Service: Neurosurgery;  Laterality: N/A;  Lumbar two-three, three-four XSTOP    There were no vitals filed for this visit.      Subjective Assessment - 10/10/15 1438    Subjective Paitent reports improvement from needling. He was sore for the two days after, but then by Saturday he felt like he didn't have any pain. He feels he could've  brought it on if he tried with certain activities. Exs seem to tighten my back up   Pertinent History lumbar laminectomy L2/3, L3/4 and L5/S1, L shoulder replacement, HTN (from chart)   How long can you walk comfortably? <200 yards   Diagnostic tests xray, mri    Patient Stated Goals decrease pain   Currently in Pain? Yes   Pain Score 2    Pain Location Back   Pain Orientation Left;Right;Lower   Pain Descriptors / Indicators Dull   Pain Type Chronic pain   Pain Frequency Intermittent   Aggravating Factors  standing and walking                         [facility] Adult PT Treatment/Exercise - 10/10/15 0001    Exercises   Exercises Lumbar;Knee/Hip   Modalities   Modalities Electrical Stimulation;Moist Heat   Moist Heat Therapy   Number Minutes Moist Heat 15 Minutes   Moist Heat Location Lumbar Spine   Electrical Stimulation   Electrical Stimulation Location B lumbar paraspinals   Electrical Stimulation Action premod   sitting   Electrical Stimulation Parameters 80-150 hz   Electrical  Stimulation Goals Pain   Ultrasound   Ultrasound Location LB paras prone   Ultrasound Parameters 1.5 w/cm2 x 10 mins   Ultrasound Goals Pain   Manual Therapy   Manual Therapy Myofascial release;Soft tissue mobilization   Manual therapy comments B lumbar paraspinals and QL prone                     PT Long Term Goals - 10/01/15 1237    PT LONG TERM GOAL #1   Title I with HEP   Time 5   Period Weeks   Status Achieved   PT LONG TERM GOAL #2   Title No pain in thoracic spine with ADLS   Time 5   Period Weeks   Status Achieved   PT LONG TERM GOAL #3   Title Patient able to perform sit <> stand without lumbar spine pain   Time 4   Period Weeks   Status Revised   PT LONG TERM GOAL #4   Title Improved pain with standing and walking by 50%.   Time 4   Period Weeks   Status New               Plan - 10/10/15 1553    Clinical Impression Statement Pt did  fairly well today with Rx. He was a little sore from the exs and to do them later at home. He did well with [place] and STW to LB paras. He was tender and had notable tightness Bil. LB paras at L4 to S1 during STW. He felt fairly well after Rx today and  had decreased tension in LB.     Rehab Potential Good   PT Frequency 2x / week   PT Duration 4 weeks   PT Treatment/Interventions ADLs/Self Care Home Management;Cryotherapy;Electrical Stimulation;Moist Heat;Therapeutic exercise;Ultrasound;Patient/family education;Manual techniques;Dry needling;Passive range of motion   PT Next Visit Plan Assess DN. Continue as indicated. Continue STW, flexibility and add core and LE strengthening.   PT Home Exercise Plan heat to needled area, QL stretch, HS stretch   Consulted and Agree with Plan of Care Patient      Patient will benefit from skilled therapeutic intervention in order to improve the following deficits and impairments:  Decreased range of motion, Pain, Postural dysfunction, Decreased activity tolerance, Decreased mobility, Impaired flexibility, Decreased strength  Visit Diagnosis: Bilateral low back pain without sciatica  Pain in thoracic spine     Problem List Patient Active Problem List   Diagnosis Date Noted  . Morbid obesity due to excess calories ([place]) 05/16/2015  . Chronic pain syndrome 05/16/2015  . OSA on CPAP 05/16/2015  . RLS (restless legs syndrome) 07/09/2014  . Paroxysmal atrial fibrillation ([place]) 07/09/2014  . Obesity hypoventilation syndrome ([place]) 07/09/2014  . OSA treated with BiPAP 07/09/2014  . Altered mental state 06/29/2014  . UTI (lower urinary tract infection) 06/29/2014    Class: Acute  . Back pain 06/29/2014    Class: Chronic  . Obstructive sleep apnea hypopnea, severe 06/29/2014    Class: Chronic  . Altered mental status 06/29/2014    Avry Roedl,CHRIS, PTA 10/10/2015, 6:22 PM  [facility] [place], [place], [address] Phone: [phone]   Fax:  [phone]  Name: Carlos Hanson MRN: [id number] Date of Birth: 04/05/1941

## 2015-10-15 ENCOUNTER — Ambulatory Visit: Payer: PPO | Admitting: Physical Therapy

## 2015-10-15 NOTE — Therapy (Signed)
Patient arrived to clinic for his appointment but stated that he wasn't feeling well and had a headache. He also reported dizziness this morning and was using his LBQC. He stated the dizziness was somewhat normal for him, but it was a little worse today. He stated his back pain was no worse than usual. Vitals were taken: BP 153/83 and HR 94, both high for patient. He stated he had a "heck of a time" getting ready to come to appointment today and thought this may be contributing to vitals. After discussion with patient he decided to cancel his appointment and return on Thursday. PT advised he seek medical treatment if vitals remain high and he continues to feel badly.  [facility] [place], [place], [address] Phone: [phone]   Fax:  [phone]  Patient Details  Name: Carlos Hanson MRN: [id number] Date of Birth: 26-May-1940 Referring Provider:  Leanna Battles, MD  Encounter Date: 10/15/2015   [name] 10/15/2015, 8:34 AM  [facility] [address] [place], [place], [address] Phone: [phone]   Fax:  [phone]

## 2015-10-17 ENCOUNTER — Ambulatory Visit: Payer: PPO | Admitting: Physical Therapy

## 2015-10-17 DIAGNOSIS — M545 Low back pain, unspecified: Secondary | ICD-10-CM

## 2015-10-17 NOTE — Therapy (Signed)
[facility] [place], [place], [address] Phone: [phone]   Fax:  [phone]  Physical Therapy Treatment  Patient Details  Name: Carlos Hanson MRN: [id number] Date of Birth: 11-21-1940 Referring Provider: Laurence Spates, MD  Encounter Date: 10/17/2015      PT End of Session - 10/17/15 0816    Visit Number 12   Number of Visits 17   Date for PT Re-Evaluation 11/12/15   PT Start Time 0816   PT Stop Time 0916   PT Time Calculation (min) 60 min   Activity Tolerance Patient tolerated treatment well   Behavior During Therapy [facility] for tasks assessed/performed      Past Medical History  Diagnosis Date  . Hypertension   . Gout   . Hypercholesteremia   . History of ulcer disease   . Chronic back pain   . Dysrhythmia     History of atrial fibrillation  . Sleep apnea     cpap sleep study 2011  . Arthritis   . H/O hiatal hernia   . History of bladder infections   . Nocturia   . History of bleeding ulcers   . Hearing decreased     right ear  . Coronary artery disease     40% mLAD/DIAG bifurcation 03/25/10   . Obstructive sleep apnea hypopnea, severe     Past Surgical History  Procedure Laterality Date  . Esophagogastroduodenoscopy    . Left shoulder replacement    . Tonsillectomy    . Back surgery    . Colonoscopy  08/27/2011    Procedure: COLONOSCOPY;  Surgeon: Rogene Houston, MD;  [facility];  Service: Endoscopy;  Laterality: N/A;  930  . Joint replacement      left shoulder  . Cardiac catheterization      2011  . Cardioverson    . Lumbar laminectomy  03/15/2012    Procedure: LUMBAR LAMINECTOMY WITH  X-STOP 2 LEVEL;  Surgeon: Kristeen Miss, MD;  Location: [place] [facility];  Service: Neurosurgery;  Laterality: N/A;  Lumbar two-three, three-four XSTOP    There were no vitals filed for this visit.      Subjective Assessment - 10/17/15 0816    Subjective Patient reports he is feeling better. He just  went home and rested. He states he had not slept well the night before. Started prednisone yesterday and feels much better.   Pertinent History lumbar laminectomy L2/3, L3/4 and L5/S1, L shoulder replacement, HTN (from chart)   How long can you walk comfortably? <200 yards   Diagnostic tests xray, mri    Patient Stated Goals decrease pain   Currently in Pain? No/denies                         [facility] Adult PT Treatment/Exercise - 10/17/15 0001    Self-Care   Self-Care Other Self-Care Comments   Other Self-Care Comments  Discussed exercise and what patient could tolerate; discussed need for weight loss. use of tennis balls to release low back muscles.   Modalities   Modalities Electrical Stimulation;Moist Heat   Moist Heat Therapy   Number Minutes Moist Heat 15 Minutes   Moist Heat Location Lumbar Spine   Electrical Stimulation   Electrical Stimulation Location B Lumbar   Electrical Stimulation Action premod 80-150 hz to tolerance x 15 min   Electrical Stimulation Goals Pain   Manual Therapy   Manual Therapy Myofascial release;Soft tissue mobilization  Manual therapy comments B lumbar paraspinals and QL prone                     PT Long Term Goals - 10/01/15 1237    PT LONG TERM GOAL #1   Title I with HEP   Time 5   Period Weeks   Status Achieved   PT LONG TERM GOAL #2   Title No pain in thoracic spine with ADLS   Time 5   Period Weeks   Status Achieved   PT LONG TERM GOAL #3   Title Patient able to perform sit <> stand without lumbar spine pain   Time 4   Period Weeks   Status Revised   PT LONG TERM GOAL #4   Title Improved pain with standing and walking by 50%.   Time 4   Period Weeks   Status New               Plan - 10/17/15 1253    Clinical Impression Statement Patient did well with deep tissue work today. PT discussed need for weight loss and various types of exercise that patient could attempt without irritating back. He  was reluctant to exercise today as he is going on vacation tomorrow. Pain was reduced today due to starting Prednisone.   PT Next Visit Plan  Continue STW, flexibility and add core and LE strengthening. DN as indicated.      Patient will benefit from skilled therapeutic intervention in order to improve the following deficits and impairments:  Decreased range of motion, Pain, Postural dysfunction, Decreased activity tolerance, Decreased mobility, Impaired flexibility, Decreased strength  Visit Diagnosis: Bilateral low back pain without sciatica     Problem List Patient Active Problem List   Diagnosis Date Noted  . Morbid obesity due to excess calories ([place]) 05/16/2015  . Chronic pain syndrome 05/16/2015  . OSA on CPAP 05/16/2015  . RLS (restless legs syndrome) 07/09/2014  . Paroxysmal atrial fibrillation ([place]) 07/09/2014  . Obesity hypoventilation syndrome ([place]) 07/09/2014  . OSA treated with BiPAP 07/09/2014  . Altered mental state 06/29/2014  . UTI (lower urinary tract infection) 06/29/2014    Class: Acute  . Back pain 06/29/2014    Class: Chronic  . Obstructive sleep apnea hypopnea, severe 06/29/2014    Class: Chronic  . Altered mental status 06/29/2014    [name] PT  10/17/2015, 12:56 PM  [facility] [address] [place], [place], [address] Phone: [phone]   Fax:  [phone]  Name: Carlos Hanson MRN: [id number] Date of Birth: November 21, 1940

## 2015-10-21 DIAGNOSIS — G4733 Obstructive sleep apnea (adult) (pediatric): Secondary | ICD-10-CM | POA: Diagnosis not present

## 2015-10-22 ENCOUNTER — Ambulatory Visit: Payer: PPO | Admitting: Physical Therapy

## 2015-10-22 DIAGNOSIS — M545 Low back pain, unspecified: Secondary | ICD-10-CM

## 2015-10-22 NOTE — Therapy (Addendum)
[facility] [place], [place], [address] Phone: [phone]   Fax:  [phone]  Physical Therapy Treatment  Patient Details  Name: Carlos Hanson MRN: [id number] Date of Birth: December 29, 1940 Referring Provider: Laurence Spates, MD  Encounter Date: 10/22/2015      PT End of Session - 10/22/15 0905    Visit Number 13   Number of Visits 17   Date for PT Re-Evaluation 11/12/15   PT Start Time 0905   PT Stop Time 1005   PT Time Calculation (min) 60 min   Activity Tolerance Patient tolerated treatment well   Behavior During Therapy [facility] for tasks assessed/performed      Past Medical History  Diagnosis Date  . Hypertension   . Gout   . Hypercholesteremia   . History of ulcer disease   . Chronic back pain   . Dysrhythmia     History of atrial fibrillation  . Sleep apnea     cpap sleep study 2011  . Arthritis   . H/O hiatal hernia   . History of bladder infections   . Nocturia   . History of bleeding ulcers   . Hearing decreased     right ear  . Coronary artery disease     40% mLAD/DIAG bifurcation 03/25/10   . Obstructive sleep apnea hypopnea, severe     Past Surgical History  Procedure Laterality Date  . Esophagogastroduodenoscopy    . Left shoulder replacement    . Tonsillectomy    . Back surgery    . Colonoscopy  08/27/2011    Procedure: COLONOSCOPY;  Surgeon: Rogene Houston, MD;  [facility];  Service: Endoscopy;  Laterality: N/A;  930  . Joint replacement      left shoulder  . Cardiac catheterization      2011  . Cardioverson    . Lumbar laminectomy  03/15/2012    Procedure: LUMBAR LAMINECTOMY WITH  X-STOP 2 LEVEL;  Surgeon: Kristeen Miss, MD;  Location: [place] [facility];  Service: Neurosurgery;  Laterality: N/A;  Lumbar two-three, three-four XSTOP    There were no vitals filed for this visit.      Subjective Assessment - 10/22/15 0906    Subjective Patient states he had a really good weekend. He  was able to be fairly active without pain, except with extended walking. His endurance has improved since taking prednisone. He was able to dress and don shoes without pain which was difficult and painful for him prior to meds.   Pertinent History lumbar laminectomy L2/3, L3/4 and L5/S1, L shoulder replacement, HTN (from chart)   How long can you walk comfortably? <200 yards   Diagnostic tests xray, mri    Patient Stated Goals decrease pain   Currently in Pain? No/denies                         [facility] Adult PT Treatment/Exercise - 10/22/15 0001    Self-Care   Other Self-Care Comments  Discussed treatement plan and need to continue LE strenghtening though sit to stands   Modalities   Modalities Electrical Stimulation;Moist Heat   Moist Heat Therapy   Number Minutes Moist Heat 15 Minutes   Moist Heat Location Lumbar Spine   Electrical Stimulation   Electrical Stimulation Location B Lumbar   Electrical Stimulation Action premod to tolerance x 15 min 80-150 hz    Electrical Stimulation Goals Pain   Manual Therapy   Manual  Therapy Soft tissue mobilization;Myofascial release   Manual therapy comments B lumbar paraspinals, QL and gluteals (superior)          Trigger Point Dry Needling - 10/22/15 1233    Consent Given? Yes   Education Handout Provided No   Muscles Treated Lower Body Gluteus maximus  B QL, lumbar paraspinals (L4/5)   Gluteus Maximus Response Twitch response elicited;Palpable increased muscle length  treated along iliac crest/medius              PT Education - 10/22/15 1236    Education provided Yes   Education Details Sit to stand at various levels   Person(s) Educated Patient   Methods Explanation;Demonstration   Comprehension Verbalized understanding             PT Long Term Goals - 10/22/15 0914    PT LONG TERM GOAL #1   Title I with HEP   Time 5   Period Weeks   Status Achieved   PT LONG TERM GOAL #2   Title No pain in  thoracic spine with ADLS   Time 5   Period Weeks   Status Achieved   PT LONG TERM GOAL #3   Title Patient able to perform sit <> stand without lumbar spine pain   Time 4   Period Weeks   Status Achieved   PT LONG TERM GOAL #4   Title Improved pain with standing and walking by 50%.   Time 4   Status Partially Met               Plan - 10/22/15 1237    Clinical Impression Statement Patient is doing very well. He continues to have latent TPs in B lumbar muscles and superior gluteals which responded well to DN and manual. He has met 3/4 LTGs and the remaining goal is close. Patient plans to wait two weeks to see how he is doing  before f/u with MD.   Rehab Potential Good   PT Frequency 2x / week   PT Duration 4 weeks   PT Treatment/Interventions ADLs/Self Care Home Management;Cryotherapy;Electrical Stimulation;Moist Heat;Therapeutic exercise;Ultrasound;Patient/family education;Manual techniques;Dry needling;Passive range of motion   PT Next Visit Plan Hold chart for two weeks.   PT Home Exercise Plan heat to needled area, QL stretch, HS stretch   Consulted and Agree with Plan of Care Patient      Patient will benefit from skilled therapeutic intervention in order to improve the following deficits and impairments:  Decreased range of motion, Pain, Postural dysfunction, Decreased activity tolerance, Decreased mobility, Impaired flexibility, Decreased strength  Visit Diagnosis: Bilateral low back pain without sciatica     Problem List Patient Active Problem List   Diagnosis Date Noted  . Morbid obesity due to excess calories ([place]) 05/16/2015  . Chronic pain syndrome 05/16/2015  . OSA on CPAP 05/16/2015  . RLS (restless legs syndrome) 07/09/2014  . Paroxysmal atrial fibrillation ([place]) 07/09/2014  . Obesity hypoventilation syndrome ([place]) 07/09/2014  . OSA treated with BiPAP 07/09/2014  . Altered mental state 06/29/2014  . UTI (lower urinary tract infection) 06/29/2014     Class: Acute  . Back pain 06/29/2014    Class: Chronic  . Obstructive sleep apnea hypopnea, severe 06/29/2014    Class: Chronic  . Altered mental status 06/29/2014    [name] PT  10/22/2015, 12:42 PM  [facility] [address] [place], [place], [address] Phone: [phone]   Fax:  [phone]  Name: Sue Mcalexander  [place] MRN: [id number] Date of Birth: Jan 27, 1941  PHYSICAL THERAPY DISCHARGE SUMMARY  Visits from Start of Care: 13.  Current functional level related to goals / functional outcomes: See above.   Remaining deficits: Goal #4 partially met.   Education / Equipment: HEP. Plan: Patient agrees to discharge.  Patient goals were partially met. Patient is being discharged due to being pleased with the current functional level.  ?????          Mali Applegate MPT

## 2015-10-24 ENCOUNTER — Encounter: Payer: PPO | Admitting: Physical Therapy

## 2015-11-12 DIAGNOSIS — M545 Low back pain: Secondary | ICD-10-CM | POA: Diagnosis not present

## 2015-11-12 DIAGNOSIS — R7301 Impaired fasting glucose: Secondary | ICD-10-CM | POA: Diagnosis not present

## 2015-11-12 DIAGNOSIS — Z6841 Body Mass Index (BMI) 40.0 and over, adult: Secondary | ICD-10-CM | POA: Diagnosis not present

## 2015-11-12 DIAGNOSIS — G4733 Obstructive sleep apnea (adult) (pediatric): Secondary | ICD-10-CM | POA: Diagnosis not present

## 2015-11-12 DIAGNOSIS — I10 Essential (primary) hypertension: Secondary | ICD-10-CM | POA: Diagnosis not present

## 2015-11-12 DIAGNOSIS — I480 Paroxysmal atrial fibrillation: Secondary | ICD-10-CM | POA: Diagnosis not present

## 2015-11-12 DIAGNOSIS — R2689 Other abnormalities of gait and mobility: Secondary | ICD-10-CM | POA: Diagnosis not present

## 2015-11-20 DIAGNOSIS — I480 Paroxysmal atrial fibrillation: Secondary | ICD-10-CM | POA: Diagnosis not present

## 2015-11-20 DIAGNOSIS — Z6841 Body Mass Index (BMI) 40.0 and over, adult: Secondary | ICD-10-CM | POA: Diagnosis not present

## 2015-11-20 DIAGNOSIS — G4733 Obstructive sleep apnea (adult) (pediatric): Secondary | ICD-10-CM | POA: Diagnosis not present

## 2015-12-13 ENCOUNTER — Other Ambulatory Visit: Payer: Self-pay | Admitting: Cardiology

## 2015-12-13 ENCOUNTER — Ambulatory Visit
Admission: RE | Admit: 2015-12-13 | Discharge: 2015-12-13 | Disposition: A | Discharge status: Home / Self Care | Payer: PPO | Source: Ambulatory Visit | Attending: Cardiology | Admitting: Cardiology

## 2015-12-13 DIAGNOSIS — Z5181 Encounter for therapeutic drug level monitoring: Secondary | ICD-10-CM

## 2015-12-13 DIAGNOSIS — I480 Paroxysmal atrial fibrillation: Secondary | ICD-10-CM | POA: Diagnosis not present

## 2015-12-17 DIAGNOSIS — G4733 Obstructive sleep apnea (adult) (pediatric): Secondary | ICD-10-CM | POA: Diagnosis not present

## 2015-12-18 DIAGNOSIS — D225 Melanocytic nevi of trunk: Secondary | ICD-10-CM | POA: Diagnosis not present

## 2015-12-18 DIAGNOSIS — L821 Other seborrheic keratosis: Secondary | ICD-10-CM | POA: Diagnosis not present

## 2015-12-21 DIAGNOSIS — G4733 Obstructive sleep apnea (adult) (pediatric): Secondary | ICD-10-CM | POA: Diagnosis not present

## 2015-12-29 ENCOUNTER — Telehealth: Payer: Self-pay | Admitting: Orthopaedic Surgery

## 2016-01-15 DIAGNOSIS — M47816 Spondylosis without myelopathy or radiculopathy, lumbar region: Secondary | ICD-10-CM | POA: Diagnosis not present

## 2016-01-15 DIAGNOSIS — M545 Low back pain: Secondary | ICD-10-CM | POA: Diagnosis not present

## 2016-01-21 DIAGNOSIS — G4733 Obstructive sleep apnea (adult) (pediatric): Secondary | ICD-10-CM | POA: Diagnosis not present

## 2016-02-10 ENCOUNTER — Ambulatory Visit (INDEPENDENT_AMBULATORY_CARE_PROVIDER_SITE_OTHER): Payer: PPO | Admitting: Physical Medicine and Rehabilitation

## 2016-02-11 ENCOUNTER — Ambulatory Visit (INDEPENDENT_AMBULATORY_CARE_PROVIDER_SITE_OTHER): Payer: PPO | Admitting: Physical Medicine and Rehabilitation

## 2016-02-11 DIAGNOSIS — M545 Low back pain: Secondary | ICD-10-CM | POA: Diagnosis not present

## 2016-02-11 DIAGNOSIS — M47816 Spondylosis without myelopathy or radiculopathy, lumbar region: Secondary | ICD-10-CM | POA: Diagnosis not present

## 2016-02-20 DIAGNOSIS — G4733 Obstructive sleep apnea (adult) (pediatric): Secondary | ICD-10-CM | POA: Diagnosis not present

## 2016-02-27 DIAGNOSIS — R7301 Impaired fasting glucose: Secondary | ICD-10-CM | POA: Diagnosis not present

## 2016-02-27 DIAGNOSIS — I480 Paroxysmal atrial fibrillation: Secondary | ICD-10-CM | POA: Diagnosis not present

## 2016-02-27 DIAGNOSIS — Z6841 Body Mass Index (BMI) 40.0 and over, adult: Secondary | ICD-10-CM | POA: Diagnosis not present

## 2016-02-27 DIAGNOSIS — I10 Essential (primary) hypertension: Secondary | ICD-10-CM | POA: Diagnosis not present

## 2016-02-27 DIAGNOSIS — R8299 Other abnormal findings in urine: Secondary | ICD-10-CM | POA: Diagnosis not present

## 2016-02-27 DIAGNOSIS — R2689 Other abnormalities of gait and mobility: Secondary | ICD-10-CM | POA: Diagnosis not present

## 2016-02-27 DIAGNOSIS — M545 Low back pain: Secondary | ICD-10-CM | POA: Diagnosis not present

## 2016-02-27 DIAGNOSIS — N390 Urinary tract infection, site not specified: Secondary | ICD-10-CM | POA: Diagnosis not present

## 2016-03-04 ENCOUNTER — Telehealth (INDEPENDENT_AMBULATORY_CARE_PROVIDER_SITE_OTHER): Payer: Self-pay | Admitting: Physical Medicine and Rehabilitation

## 2016-03-04 NOTE — Telephone Encounter (Signed)
Faxed [employer] Kaufmann form along with previous office notes and therapy notes to [phone]

## 2016-03-04 NOTE — Telephone Encounter (Signed)
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 Denies had diagnostic facet joint blocks at L3 [place] L4-5 bilaterally. This was done in October and he did well for about a week with more than 75% relief. He has had physical therapy as well. I would try to get preauthorization for radiofrequency ablation of those joints. You can use this documentation along with his notes from [facility]

## 2016-03-09 NOTE — Telephone Encounter (Signed)
Received auth form [employer]. Josem Kaufmann [id number] for bilateral [phone] and [phone]. Good from 03/09/16-09/05/16. I called pt and scheduled him for 03/30/16 @ 3:30 and 04/08/16 @ 3:30 w/driver. Pt wanted to wait until after thanksgiving to schedule.

## 2016-03-13 DIAGNOSIS — H43393 Other vitreous opacities, bilateral: Secondary | ICD-10-CM | POA: Diagnosis not present

## 2016-03-13 DIAGNOSIS — H40033 Anatomical narrow angle, bilateral: Secondary | ICD-10-CM | POA: Diagnosis not present

## 2016-03-24 ENCOUNTER — Ambulatory Visit (INDEPENDENT_AMBULATORY_CARE_PROVIDER_SITE_OTHER): Payer: PPO | Admitting: Urology

## 2016-03-24 DIAGNOSIS — R972 Elevated prostate specific antigen [PSA]: Secondary | ICD-10-CM

## 2016-03-30 ENCOUNTER — Ambulatory Visit (INDEPENDENT_AMBULATORY_CARE_PROVIDER_SITE_OTHER): Payer: PPO | Admitting: Physical Medicine and Rehabilitation

## 2016-03-30 ENCOUNTER — Encounter (INDEPENDENT_AMBULATORY_CARE_PROVIDER_SITE_OTHER): Payer: Self-pay | Admitting: Physical Medicine and Rehabilitation

## 2016-03-30 VITALS — BP 155/90 | HR 75 | Temp 98.1°F

## 2016-03-30 DIAGNOSIS — M47816 Spondylosis without myelopathy or radiculopathy, lumbar region: Secondary | ICD-10-CM | POA: Diagnosis not present

## 2016-03-30 DIAGNOSIS — G8929 Other chronic pain: Secondary | ICD-10-CM

## 2016-03-30 DIAGNOSIS — M545 Low back pain: Secondary | ICD-10-CM | POA: Diagnosis not present

## 2016-03-30 MED ORDER — LIDOCAINE HCL (PF) 1 % IJ SOLN
0.3300 mL | Freq: Once | INTRAMUSCULAR | Status: AC
  Administered 2016-03-30: 0.3 mL

## 2016-03-30 MED ORDER — METHYLPREDNISOLONE ACETATE 80 MG/ML IJ SUSP
80.0000 mg | Freq: Once | INTRAMUSCULAR | Status: AC
  Administered 2016-03-30: 80 mg

## 2016-03-30 NOTE — Progress Notes (Signed)
Carlos Hanson - 75 y.o. male MRN [id number]  Date of birth: [birth year]  Office Visit Note: Visit Date: 03/30/2016 PCP: Donnajean Lopes, MD Referred by: Leanna Battles, MD  Subjective: Chief Complaint  Patient presents with  . Lower Back - Pain   HPI: Carlos Hanson is a 75 year old gentleman that we have seen through Dr. Sharlett Iles. His history that is outlined in the prior notes is that he had 3 level X-Stop procedures performed for mild stenosis. He has had imaging showing no real frank stenosis lately. He does have facet arthropathy and axial back pain. He has had relief with medial branch blocks of the L3-4 and L4-5 facet joints and this is documented. He has had medication management including opioid medications which she still takes. He has had physical therapy. He is still having axial back pain worse with standing worse with twisting only relieved with medial branch blocks. This has been  A chronic long-term situation for him. Pain across lower back. Worse on right side and worse with walking and standing. Relief with laying or sitting. He continues to swim daily for exercise.      ROS Otherwise per HPI.  Assessment & Plan: Visit Diagnoses:  1. Spondylosis without myelopathy or radiculopathy, lumbar region   2. Chronic bilateral low back pain without sciatica     Plan: Findings:  Right-sided L2 and L3 and L4 medial branch radiofrequency ablation to denervate the right L3-4 L4-5 facet joints.    Meds & Orders:  Meds ordered this encounter  Medications  . lidocaine (PF) (XYLOCAINE) 1 % injection 0.3 mL  . methylPREDNISolone acetate (DEPO-MEDROL) injection 80 mg    Orders Placed This Encounter  Procedures  . Radiofrequency,Lumbar    Follow-up: Return for Scheduled radiofrequency ablation of the left side..   Procedures: No procedures performed  Lumbar Facet Joint Nerve Denervation  Patient: Carlos Hanson      Date of Birth: [birth year] MRN: [id number] PCP: Donnajean Lopes, MD      Visit Date: 03/30/2016   Universal Protocol:    Date/Time: 11/27/173:52 PM  Consent Given By: the patient  Position: PRONE  Additional Comments: Vital signs were monitored before and after the procedure. Patient was prepped and draped in the usual sterile fashion. The correct patient, procedure, and site was verified.   Injection Procedure Details:  Procedure Site One Meds Administered:  Meds ordered this encounter  Medications  . lidocaine (PF) (XYLOCAINE) 1 % injection 0.3 mL  . methylPREDNISolone acetate (DEPO-MEDROL) injection 80 mg     Laterality: Right  Location/Site:  L3-L4 L4-L5  Needle size: 18 G  Needle type: Radiofrequency cannula  Needle Placement: Along juncture of superior articular process and transverse pocess  Findings:  -Comments:  Procedure Details: For each desired target nerve, the corresponding transverse process (sacral ala for the L5 dorsal rami) was identified and the fluoroscope was positioned to square off the endplates of the corresponding vertebral body to achieve a true AP midline view.  The beam was then obliqued 15 to 20 degrees and caudally tilted 15 to 20 degrees to line up a trajectory along the target nerves. The skin over the target of the junction of superior articulating process and transverse process (sacral ala for the L5 dorsal rami) was infiltrated with 74ml of 1% Lidocaine without Epinephrine.  The 18 gauge 42mm active tip outer cannula was advanced in trajectory view to the target.  This procedure was repeated for each target nerve.  Then, for all  levels, the outer cannula placement was fine-tuned and the position was then confirmed with bi-planar imaging.    Test stimulation was done both at sensory and motor levels to ensure there was no radicular stimulation. The target tissues were then infiltrated with 1 ml of 1% Lidocaine without Epinephrine. Subsequently, a percutaneous neurotomy was carried out for 60 seconds  at 80 degrees Celsius. The procedure was repeated with the cannula rotated 90 degrees, for duration of 60 seconds, one additional time at each level for a total of two lesions per level.  After the completion of the two lesions, 1 ml of injectate was delivered. It was then repeated for each facet joint nerve mentioned above. Appropriate radiographs were obtained to verify the probe placement during the neurotomy.   Additional Comments:  The patient tolerated the procedure well Dressing: Band-Aid and 2x2 sterile gauze     Post-procedure details: Patient was observed during the procedure. Post-procedure instructions were reviewed.  Patient left the clinic in stable condition.      Clinical History: No specialty comments available.  He reports that he has quit smoking. His smoking use included Cigarettes. He has a 3.00 pack-year smoking history. He has never used smokeless tobacco. No results for input(s): HGBA1C, LABURIC in the last 8760 hours.  Objective:  VS:  HT:    WT:   BMI:     BP:(!) 155/90  HR:75bpm  TEMP:98.1 F (36.7 C)(Oral)  RESP:95 % Physical Exam  Musculoskeletal:  Patient ambulates with a cane. He is a very stiff lower back pain with extension rotation. Good distal strength.    Ortho Exam Imaging: No results found.  Past Medical/Family/Surgical/Social History: Medications & Allergies reviewed per EMR Patient Active Problem List   Diagnosis Date Noted  . Morbid obesity due to excess calories ([place]) 05/16/2015  . Chronic pain syndrome 05/16/2015  . OSA on CPAP 05/16/2015  . RLS (restless legs syndrome) 07/09/2014  . Paroxysmal atrial fibrillation ([place]) 07/09/2014  . Obesity hypoventilation syndrome ([place]) 07/09/2014  . OSA treated with BiPAP 07/09/2014  . Altered mental state 06/29/2014  . UTI (lower urinary tract infection) 06/29/2014    Class: Acute  . Back pain 06/29/2014    Class: Chronic  . Obstructive sleep apnea hypopnea, severe 06/29/2014     Class: Chronic  . Altered mental status 06/29/2014   Past Medical History:  Diagnosis Date  . Arthritis   . Chronic back pain   . Coronary artery disease    40% mLAD/DIAG bifurcation 03/25/10   . Dysrhythmia    History of atrial fibrillation  . Gout   . H/O hiatal hernia   . Hearing decreased    right ear  . History of bladder infections   . History of bleeding ulcers   . History of ulcer disease   . Hypercholesteremia   . Hypertension   . Nocturia   . Obstructive sleep apnea hypopnea, severe   . Sleep apnea    cpap sleep study 2011   Family History  Problem Relation Age of Onset  . Colon cancer Neg Hx   . Heart failure Mother   . Diabetes Mother   . Arthritis Mother   . High blood pressure Mother   . Heart failure Father    Past Surgical History:  Procedure Laterality Date  . BACK SURGERY    . CARDIAC CATHETERIZATION     2011  . cardioverson    . COLONOSCOPY  08/27/2011   Procedure: COLONOSCOPY;  Surgeon: Mechele Dawley  Laural Golden, MD;  [facility];  Service: Endoscopy;  Laterality: N/A;  930  . ESOPHAGOGASTRODUODENOSCOPY    . JOINT REPLACEMENT     left shoulder  . Left shoulder replacement    . LUMBAR LAMINECTOMY  03/15/2012   Procedure: LUMBAR LAMINECTOMY WITH  X-STOP 2 LEVEL;  Surgeon: Kristeen Miss, MD;  Location: [facility];  Service: Neurosurgery;  Laterality: N/A;  Lumbar two-three, three-four XSTOP  . TONSILLECTOMY     Social History   Occupational History  .      Retired   Social History Main Topics  . Smoking status: Former Smoker    Packs/day: 1.00    Years: 3.00    Types: Cigarettes  . Smokeless tobacco: Never Used     Comment: Quit 8 years ago.  . Alcohol use 8.4 oz/week    14 Standard drinks or equivalent per week     Comment: one drink a day  . Drug use: No  . Sexual activity: Not on file

## 2016-03-30 NOTE — Patient Instructions (Signed)

## 2016-03-30 NOTE — Procedures (Signed)
Lumbar Facet Joint Nerve Denervation  Patient: Carlos Hanson      Date of Birth: [birth year] MRN: [id number] PCP: Donnajean Lopes, MD      Visit Date: 03/30/2016   Universal Protocol:    Date/Time: 11/27/173:52 PM  Consent Given By: the patient  Position: PRONE  Additional Comments: Vital signs were monitored before and after the procedure. Patient was prepped and draped in the usual sterile fashion. The correct patient, procedure, and site was verified.   Injection Procedure Details:  Procedure Site One Meds Administered:  Meds ordered this encounter  Medications  . lidocaine (PF) (XYLOCAINE) 1 % injection 0.3 mL  . methylPREDNISolone acetate (DEPO-MEDROL) injection 80 mg     Laterality: Right  Location/Site:  L3-L4 L4-L5  Needle size: 18 G  Needle type: Radiofrequency cannula  Needle Placement: Along juncture of superior articular process and transverse pocess  Findings:  -Comments:  Procedure Details: For each desired target nerve, the corresponding transverse process (sacral ala for the L5 dorsal rami) was identified and the fluoroscope was positioned to square off the endplates of the corresponding vertebral body to achieve a true AP midline view.  The beam was then obliqued 15 to 20 degrees and caudally tilted 15 to 20 degrees to line up a trajectory along the target nerves. The skin over the target of the junction of superior articulating process and transverse process (sacral ala for the L5 dorsal rami) was infiltrated with 20ml of 1% Lidocaine without Epinephrine.  The 18 gauge 67mm active tip outer cannula was advanced in trajectory view to the target.  This procedure was repeated for each target nerve.  Then, for all levels, the outer cannula placement was fine-tuned and the position was then confirmed with bi-planar imaging.    Test stimulation was done both at sensory and motor levels to ensure there was no radicular stimulation. The target tissues were then  infiltrated with 1 ml of 1% Lidocaine without Epinephrine. Subsequently, a percutaneous neurotomy was carried out for 60 seconds at 80 degrees Celsius. The procedure was repeated with the cannula rotated 90 degrees, for duration of 60 seconds, one additional time at each level for a total of two lesions per level.  After the completion of the two lesions, 1 ml of injectate was delivered. It was then repeated for each facet joint nerve mentioned above. Appropriate radiographs were obtained to verify the probe placement during the neurotomy.   Additional Comments:  The patient tolerated the procedure well Dressing: Band-Aid and 2x2 sterile gauze     Post-procedure details: Patient was observed during the procedure. Post-procedure instructions were reviewed.  Patient left the clinic in stable condition.

## 2016-04-02 ENCOUNTER — Encounter (INDEPENDENT_AMBULATORY_CARE_PROVIDER_SITE_OTHER): Payer: PPO | Admitting: Ophthalmology

## 2016-04-02 DIAGNOSIS — H35033 Hypertensive retinopathy, bilateral: Secondary | ICD-10-CM

## 2016-04-02 DIAGNOSIS — H35371 Puckering of macula, right eye: Secondary | ICD-10-CM

## 2016-04-02 DIAGNOSIS — H43813 Vitreous degeneration, bilateral: Secondary | ICD-10-CM | POA: Diagnosis not present

## 2016-04-02 DIAGNOSIS — H353131 Nonexudative age-related macular degeneration, bilateral, early dry stage: Secondary | ICD-10-CM | POA: Diagnosis not present

## 2016-04-02 DIAGNOSIS — I10 Essential (primary) hypertension: Secondary | ICD-10-CM | POA: Diagnosis not present

## 2016-04-08 ENCOUNTER — Ambulatory Visit (INDEPENDENT_AMBULATORY_CARE_PROVIDER_SITE_OTHER): Payer: PPO | Admitting: Physical Medicine and Rehabilitation

## 2016-04-08 ENCOUNTER — Encounter (INDEPENDENT_AMBULATORY_CARE_PROVIDER_SITE_OTHER): Payer: Self-pay | Admitting: Physical Medicine and Rehabilitation

## 2016-04-08 VITALS — BP 148/82 | HR 89

## 2016-04-08 DIAGNOSIS — M545 Low back pain: Secondary | ICD-10-CM

## 2016-04-08 DIAGNOSIS — G8929 Other chronic pain: Secondary | ICD-10-CM

## 2016-04-08 DIAGNOSIS — M47816 Spondylosis without myelopathy or radiculopathy, lumbar region: Secondary | ICD-10-CM

## 2016-04-08 MED ORDER — LIDOCAINE HCL (PF) 1 % IJ SOLN
0.3300 mL | Freq: Once | INTRAMUSCULAR | Status: AC
Start: 2016-04-08 — End: 2016-04-08
  Administered 2016-04-08: 0.3 mL

## 2016-04-08 MED ORDER — METHYLPREDNISOLONE ACETATE 80 MG/ML IJ SUSP
80.0000 mg | Freq: Once | INTRAMUSCULAR | Status: AC
  Administered 2016-04-08: 80 mg

## 2016-04-08 NOTE — Progress Notes (Signed)
[name] - 75 y.o. male MRN [id number]  Date of birth: [birth year]  Office Visit Note: Visit Date: 04/08/2016 PCP: Donnajean Lopes, MD Referred by: Leanna Battles, MD  Subjective: Chief Complaint  Patient presents with  . Lower Back - Pain   HPI: Mr. Rosalez very pleasant 75 year old gentleman with chronic worsening axial low back pain now left worse than right after right-sided radiofrequency ablation of the L3-4 and L4-5 facet joints. We did that a few weeks ago and he actually has seen some meaningful relief. By way of review he has 3 level next procedures performed in the past. MRI does not show any significant stenosis. He is all axial low back pain and didn't respond to the facet joint blocks of these are all well documented. He's had physical therapy. We are going to go ahead today and complete ablation of the left L2-L3 and L4 medial branches to denervate the left L3-4 and L4-5 facet joints.      ROS Otherwise per HPI.  Assessment & Plan: Visit Diagnoses:  1. Spondylosis without myelopathy or radiculopathy, lumbar region   2. Chronic bilateral low back pain without sciatica     Plan: Findings:  We are going to go ahead today and complete ablation of the left L2-L3 and L4 medial branches to denervate the left L3-4 and L4-5 facet joints.     Meds & Orders:  Meds ordered this encounter  Medications  . lidocaine (PF) (XYLOCAINE) 1 % injection 0.3 mL  . methylPREDNISolone acetate (DEPO-MEDROL) injection 80 mg    Orders Placed This Encounter  Procedures  . Radiofrequency,Lumbar    Follow-up: Return in about 4 weeks (around 05/06/2016).   Procedures: No procedures performed  Lumbar Facet Joint Nerve Denervation  Patient: ROCKLIN OMURA      Date of Birth: [birth year] MRN: [id number] PCP: Donnajean Lopes, MD      Visit Date: 04/08/2016   Universal Protocol:    Date/Time: 12/06/175:03 PM  Consent Given By: the patient  Position: PRONE  Additional  Comments: Vital signs were monitored before and after the procedure. Patient was prepped and draped in the usual sterile fashion. The correct patient, procedure, and site was verified.   Injection Procedure Details:  Procedure Site One Meds Administered:  Meds ordered this encounter  Medications  . lidocaine (PF) (XYLOCAINE) 1 % injection 0.3 mL  . methylPREDNISolone acetate (DEPO-MEDROL) injection 80 mg     Laterality: Left  Location/Site:  L3-L4 L4-L5  Needle size: 18 G  Needle type: Radiofrequency cannula  Needle Placement: Along juncture of superior articular process and transverse pocess  Findings:  -Comments:  Procedure Details: For each desired target nerve, the corresponding transverse process (sacral ala for the L5 dorsal rami) was identified and the fluoroscope was positioned to square off the endplates of the corresponding vertebral body to achieve a true AP midline view.  The beam was then obliqued 15 to 20 degrees and caudally tilted 15 to 20 degrees to line up a trajectory along the target nerves. The skin over the target of the junction of superior articulating process and transverse process (sacral ala for the L5 dorsal rami) was infiltrated with 81ml of 1% Lidocaine without Epinephrine.  The 18 gauge 71mm active tip outer cannula was advanced in trajectory view to the target.  This procedure was repeated for each target nerve.  Then, for all levels, the outer cannula placement was fine-tuned and the position was then confirmed with bi-planar imaging.  Test stimulation was done both at sensory and motor levels to ensure there was no radicular stimulation. The target tissues were then infiltrated with 1 ml of 1% Lidocaine without Epinephrine. Subsequently, a percutaneous neurotomy was carried out for 60 seconds at 80 degrees Celsius. The procedure was repeated with the cannula rotated 90 degrees, for duration of 60 seconds, one additional time at each level for a  total of two lesions per level.  After the completion of the two lesions, 1 ml of injectate was delivered. It was then repeated for each facet joint nerve mentioned above. Appropriate radiographs were obtained to verify the probe placement during the neurotomy.   Additional Comments:  The patient tolerated the procedure well Dressing: Band-Aid    Post-procedure details: Patient was observed during the procedure. Post-procedure instructions were reviewed.  Patient left the clinic in stable condition.      Clinical History: No specialty comments available.  He reports that he has quit smoking. His smoking use included Cigarettes. He has a 3.00 pack-year smoking history. He has never used smokeless tobacco. No results for input(s): HGBA1C, LABURIC in the last 8760 hours.  Objective:  VS:  HT:    WT:   BMI:     BP:(!) 148/82  HR:89bpm  TEMP: ( )  RESP:95 % Physical Exam  Ortho Exam Imaging: No results found.  Past Medical/Family/Surgical/Social History: Medications & Allergies reviewed per EMR Patient Active Problem List   Diagnosis Date Noted  . Morbid obesity due to excess calories ([place]) 05/16/2015  . Chronic pain syndrome 05/16/2015  . OSA on CPAP 05/16/2015  . RLS (restless legs syndrome) 07/09/2014  . Paroxysmal atrial fibrillation ([place]) 07/09/2014  . Obesity hypoventilation syndrome ([place]) 07/09/2014  . OSA treated with BiPAP 07/09/2014  . Altered mental state 06/29/2014  . UTI (lower urinary tract infection) 06/29/2014    Class: Acute  . Back pain 06/29/2014    Class: Chronic  . Obstructive sleep apnea hypopnea, severe 06/29/2014    Class: Chronic  . Altered mental status 06/29/2014   Past Medical History:  Diagnosis Date  . Arthritis   . Chronic back pain   . Coronary artery disease    40% mLAD/DIAG bifurcation 03/25/10   . Dysrhythmia    History of atrial fibrillation  . Gout   . H/O hiatal hernia   . Hearing decreased    right ear  . History of  bladder infections   . History of bleeding ulcers   . History of ulcer disease   . Hypercholesteremia   . Hypertension   . Nocturia   . Obstructive sleep apnea hypopnea, severe   . Sleep apnea    cpap sleep study 2011   Family History  Problem Relation Age of Onset  . Colon cancer Neg Hx   . Heart failure Mother   . Diabetes Mother   . Arthritis Mother   . High blood pressure Mother   . Heart failure Father    Past Surgical History:  Procedure Laterality Date  . BACK SURGERY    . CARDIAC CATHETERIZATION     2011  . cardioverson    . COLONOSCOPY  08/27/2011   Procedure: COLONOSCOPY;  Surgeon: Rogene Houston, MD;  [facility];  Service: Endoscopy;  Laterality: N/A;  930  . ESOPHAGOGASTRODUODENOSCOPY    . JOINT REPLACEMENT     left shoulder  . Left shoulder replacement    . LUMBAR LAMINECTOMY  03/15/2012   Procedure: LUMBAR LAMINECTOMY WITH  X-STOP 2 LEVEL;  Surgeon: Kristeen Miss, MD;  Location: [place] [facility];  Service: Neurosurgery;  Laterality: N/A;  Lumbar two-three, three-four XSTOP  . TONSILLECTOMY     Social History   Occupational History  .      Retired   Social History Main Topics  . Smoking status: Former Smoker    Packs/day: 1.00    Years: 3.00    Types: Cigarettes  . Smokeless tobacco: Never Used     Comment: Quit 8 years ago.  . Alcohol use 8.4 oz/week    14 Standard drinks or equivalent per week     Comment: one drink a day  . Drug use: No  . Sexual activity: Not on file

## 2016-04-08 NOTE — Patient Instructions (Signed)

## 2016-04-08 NOTE — Procedures (Signed)
Lumbar Facet Joint Nerve Denervation  Patient: Carlos Hanson      Date of Birth: 08/27/1940 MRN: [id number] PCP: Donnajean Lopes, MD      Visit Date: 04/08/2016   Universal Protocol:    Date/Time: 12/06/175:03 PM  Consent Given By: the patient  Position: PRONE  Additional Comments: Vital signs were monitored before and after the procedure. Patient was prepped and draped in the usual sterile fashion. The correct patient, procedure, and site was verified.   Injection Procedure Details:  Procedure Site One Meds Administered:  Meds ordered this encounter  Medications  . lidocaine (PF) (XYLOCAINE) 1 % injection 0.3 mL  . methylPREDNISolone acetate (DEPO-MEDROL) injection 80 mg     Laterality: Left  Location/Site:  L3-L4 L4-L5  Needle size: 18 G  Needle type: Radiofrequency cannula  Needle Placement: Along juncture of superior articular process and transverse pocess  Findings:  -Comments:  Procedure Details: For each desired target nerve, the corresponding transverse process (sacral ala for the L5 dorsal rami) was identified and the fluoroscope was positioned to square off the endplates of the corresponding vertebral body to achieve a true AP midline view.  The beam was then obliqued 15 to 20 degrees and caudally tilted 15 to 20 degrees to line up a trajectory along the target nerves. The skin over the target of the junction of superior articulating process and transverse process (sacral ala for the L5 dorsal rami) was infiltrated with 18ml of 1% Lidocaine without Epinephrine.  The 18 gauge 14mm active tip outer cannula was advanced in trajectory view to the target.  This procedure was repeated for each target nerve.  Then, for all levels, the outer cannula placement was fine-tuned and the position was then confirmed with bi-planar imaging.    Test stimulation was done both at sensory and motor levels to ensure there was no radicular stimulation. The target tissues were then  infiltrated with 1 ml of 1% Lidocaine without Epinephrine. Subsequently, a percutaneous neurotomy was carried out for 60 seconds at 80 degrees Celsius. The procedure was repeated with the cannula rotated 90 degrees, for duration of 60 seconds, one additional time at each level for a total of two lesions per level.  After the completion of the two lesions, 1 ml of injectate was delivered. It was then repeated for each facet joint nerve mentioned above. Appropriate radiographs were obtained to verify the probe placement during the neurotomy.   Additional Comments:  The patient tolerated the procedure well Dressing: Band-Aid    Post-procedure details: Patient was observed during the procedure. Post-procedure instructions were reviewed.  Patient left the clinic in stable condition.

## 2016-05-01 ENCOUNTER — Emergency Department (HOSPITAL_COMMUNITY): Payer: PPO

## 2016-05-01 ENCOUNTER — Encounter (HOSPITAL_COMMUNITY): Payer: Self-pay | Admitting: Nurse Practitioner

## 2016-05-01 ENCOUNTER — Emergency Department (HOSPITAL_COMMUNITY)
Admission: EM | Admit: 2016-05-01 | Discharge: 2016-05-01 | Disposition: A | Discharge status: Home / Self Care | Payer: PPO | Attending: Emergency Medicine | Admitting: Emergency Medicine

## 2016-05-01 DIAGNOSIS — R404 Transient alteration of awareness: Secondary | ICD-10-CM | POA: Diagnosis not present

## 2016-05-01 DIAGNOSIS — W19XXXA Unspecified fall, initial encounter: Secondary | ICD-10-CM | POA: Insufficient documentation

## 2016-05-01 DIAGNOSIS — Z87891 Personal history of nicotine dependence: Secondary | ICD-10-CM | POA: Insufficient documentation

## 2016-05-01 DIAGNOSIS — Y929 Unspecified place or not applicable: Secondary | ICD-10-CM | POA: Insufficient documentation

## 2016-05-01 DIAGNOSIS — W1830XA Fall on same level, unspecified, initial encounter: Secondary | ICD-10-CM | POA: Insufficient documentation

## 2016-05-01 DIAGNOSIS — R509 Fever, unspecified: Secondary | ICD-10-CM | POA: Diagnosis not present

## 2016-05-01 DIAGNOSIS — R5381 Other malaise: Secondary | ICD-10-CM | POA: Diagnosis not present

## 2016-05-01 DIAGNOSIS — R109 Unspecified abdominal pain: Secondary | ICD-10-CM | POA: Insufficient documentation

## 2016-05-01 DIAGNOSIS — Z96612 Presence of left artificial shoulder joint: Secondary | ICD-10-CM | POA: Insufficient documentation

## 2016-05-01 DIAGNOSIS — I2510 Atherosclerotic heart disease of native coronary artery without angina pectoris: Secondary | ICD-10-CM | POA: Diagnosis not present

## 2016-05-01 DIAGNOSIS — S20211A Contusion of right front wall of thorax, initial encounter: Secondary | ICD-10-CM | POA: Diagnosis not present

## 2016-05-01 DIAGNOSIS — S299XXA Unspecified injury of thorax, initial encounter: Secondary | ICD-10-CM | POA: Diagnosis not present

## 2016-05-01 DIAGNOSIS — Y999 Unspecified external cause status: Secondary | ICD-10-CM | POA: Insufficient documentation

## 2016-05-01 DIAGNOSIS — R05 Cough: Secondary | ICD-10-CM | POA: Diagnosis not present

## 2016-05-01 DIAGNOSIS — R531 Weakness: Secondary | ICD-10-CM | POA: Diagnosis not present

## 2016-05-01 DIAGNOSIS — I10 Essential (primary) hypertension: Secondary | ICD-10-CM | POA: Insufficient documentation

## 2016-05-01 DIAGNOSIS — Y939 Activity, unspecified: Secondary | ICD-10-CM | POA: Diagnosis not present

## 2016-05-01 DIAGNOSIS — R0781 Pleurodynia: Secondary | ICD-10-CM | POA: Diagnosis not present

## 2016-05-01 DIAGNOSIS — R52 Pain, unspecified: Secondary | ICD-10-CM

## 2016-05-01 DIAGNOSIS — R350 Frequency of micturition: Secondary | ICD-10-CM | POA: Diagnosis not present

## 2016-05-01 LAB — CBC WITH DIFFERENTIAL/PLATELET
BASOS ABS: 0 10*3/uL (ref 0.0–0.1)
BASOS PCT: 0 %
EOS ABS: 0.2 10*3/uL (ref 0.0–0.7)
EOS PCT: 4 %
HCT: 39.7 % (ref 39.0–52.0)
HEMOGLOBIN: 13.1 g/dL (ref 13.0–17.0)
LYMPHS ABS: 1.1 10*3/uL (ref 0.7–4.0)
Lymphocytes Relative: 30 %
MCH: 32 pg (ref 26.0–34.0)
MCHC: 33 g/dL (ref 30.0–36.0)
MCV: 96.8 fL (ref 78.0–100.0)
Monocytes Absolute: 0.4 10*3/uL (ref 0.1–1.0)
Monocytes Relative: 11 %
NEUTROS PCT: 55 %
Neutro Abs: 2 10*3/uL (ref 1.7–7.7)
PLATELETS: 203 10*3/uL (ref 150–400)
RBC: 4.1 MIL/uL — AB (ref 4.22–5.81)
RDW: 14.6 % (ref 11.5–15.5)
WBC: 3.7 10*3/uL — AB (ref 4.0–10.5)

## 2016-05-01 LAB — COMPREHENSIVE METABOLIC PANEL
ALT: 35 U/L (ref 17–63)
AST: 36 U/L (ref 15–41)
Albumin: 3.2 g/dL — ABNORMAL LOW (ref 3.5–5.0)
Alkaline Phosphatase: 42 U/L (ref 38–126)
Anion gap: 8 (ref 5–15)
BILIRUBIN TOTAL: 0.5 mg/dL (ref 0.3–1.2)
BUN: 25 mg/dL — AB (ref 6–20)
CALCIUM: 8.5 mg/dL — AB (ref 8.9–10.3)
CHLORIDE: 104 mmol/L (ref 101–111)
CO2: 27 mmol/L (ref 22–32)
CREATININE: 1.66 mg/dL — AB (ref 0.61–1.24)
GFR, EST AFRICAN AMERICAN: 45 mL/min — AB (ref >60)
GFR, EST NON AFRICAN AMERICAN: 38 mL/min — AB (ref >60)
Glucose, Bld: 103 mg/dL — ABNORMAL HIGH (ref 65–99)
Potassium: 4.9 mmol/L (ref 3.5–5.1)
Sodium: 139 mmol/L (ref 135–145)
TOTAL PROTEIN: 6.4 g/dL — AB (ref 6.5–8.1)

## 2016-05-01 LAB — URINALYSIS, ROUTINE W REFLEX MICROSCOPIC
Bacteria, UA: NONE SEEN
Bilirubin Urine: NEGATIVE
Glucose, UA: NEGATIVE mg/dL
HGB URINE DIPSTICK: NEGATIVE
Ketones, ur: NEGATIVE mg/dL
LEUKOCYTES UA: NEGATIVE
Nitrite: NEGATIVE
PH: 5 (ref 5.0–8.0)
Protein, ur: 100 mg/dL — AB
SPECIFIC GRAVITY, URINE: 1.026 (ref 1.005–1.030)

## 2016-05-01 LAB — TROPONIN I

## 2016-05-01 MED ORDER — SODIUM CHLORIDE 0.9 % IV BOLUS (SEPSIS)
1000.0000 mL | Freq: Once | INTRAVENOUS | Status: AC
  Administered 2016-05-01: 1000 mL via INTRAVENOUS

## 2016-05-01 NOTE — ED Provider Notes (Signed)
[facility] DEPT Provider Note   CSN: [id number] Arrival date & time: 05/01/16  1220     History   Chief Complaint Chief Complaint  Patient presents with  . Fall    HPI Carlos Hanson is a 75 y.o. male.  He presents for evaluation of feeling off balance, cough, nasal congestion, sputum production, cough, and urinary incontinence for several days. His wife states that he is hoarse, and she feels that they both got a "cold from her granddaughter". Today he was in his PCP office, getting a chest x-ray when he suddenly collapsed, as he was attempting to position himself for a chest x-ray. He feels like he may have been off balance, causing the fall. He went to the floor, but did not injure himself. His wife states that he fell at home several days ago in the bathtub injuring his right flank. He has not had any fever. He has chronic low back pain. There are no other known modifying factors.   HPI  Past Medical History:  Diagnosis Date  . Arthritis   . Chronic back pain   . Coronary artery disease    40% mLAD/DIAG bifurcation 03/25/10   . Dysrhythmia    History of atrial fibrillation  . Gout   . H/O hiatal hernia   . Hearing decreased    right ear  . History of bladder infections   . History of bleeding ulcers   . History of ulcer disease   . Hypercholesteremia   . Hypertension   . Nocturia   . Obstructive sleep apnea hypopnea, severe   . Sleep apnea    cpap sleep study 2011    Patient Active Problem List   Diagnosis Date Noted  . Morbid obesity due to excess calories ([place]) 05/16/2015  . Chronic pain syndrome 05/16/2015  . OSA on CPAP 05/16/2015  . RLS (restless legs syndrome) 07/09/2014  . Paroxysmal atrial fibrillation ([place]) 07/09/2014  . Obesity hypoventilation syndrome ([place]) 07/09/2014  . OSA treated with BiPAP 07/09/2014  . Altered mental state 06/29/2014  . UTI (lower urinary tract infection) 06/29/2014    Class: Acute  . Back pain 06/29/2014    Class:  Chronic  . Obstructive sleep apnea hypopnea, severe 06/29/2014    Class: Chronic  . Altered mental status 06/29/2014    Past Surgical History:  Procedure Laterality Date  . BACK SURGERY    . CARDIAC CATHETERIZATION     2011  . cardioverson    . COLONOSCOPY  08/27/2011   Procedure: COLONOSCOPY;  Surgeon: Rogene Houston, MD;  [facility];  Service: Endoscopy;  Laterality: N/A;  930  . ESOPHAGOGASTRODUODENOSCOPY    . JOINT REPLACEMENT     left shoulder  . Left shoulder replacement    . LUMBAR LAMINECTOMY  03/15/2012   Procedure: LUMBAR LAMINECTOMY WITH  X-STOP 2 LEVEL;  Surgeon: Kristeen Miss, MD;  Location: [facility];  Service: Neurosurgery;  Laterality: N/A;  Lumbar two-three, three-four XSTOP  . TONSILLECTOMY         Home Medications    Prior to Admission medications   Medication Sig Start Date End Date Taking? Authorizing Provider  allopurinol (ZYLOPRIM) 300 MG tablet TAKE 1 TABLET BY MOUTH EVERY DAY 12/30/15   Sanjuana Kava, MD  amiodarone (PACERONE) 200 MG tablet Take 100 mg by mouth daily.     Historical Provider, MD  clotrimazole (LOTRIMIN) 1 % cream Apply topically 2 (two) times daily. 07/01/14   Leanna Battles, MD  dabigatran (PRADAXA) 150 MG CAPS Take 150 mg by mouth every 12 (twelve) hours.    Historical Provider, MD  diltiazem (DILACOR XR) 180 MG 24 hr capsule Take 180 mg by mouth daily.    Historical Provider, MD  DULoxetine (CYMBALTA) 30 MG capsule Take 60 mg by mouth daily.    Historical Provider, MD  morphine (MSIR) 15 MG tablet Take 15 mg by mouth 2 (two) times daily.    Historical Provider, MD  oxyCODONE-acetaminophen (PERCOCET) 10-325 MG per tablet Take 1 tablet by mouth 3 (three) times daily as needed for pain.    Historical Provider, MD  oxymetazoline (AFRIN) 0.05 % nasal spray Place 1 spray into both nostrils at bedtime as needed for congestion (sinuses).    Historical Provider, MD  pantoprazole (PROTONIX) 40 MG tablet Take 40 mg by mouth 2  (two) times daily.    Historical Provider, MD  predniSONE (STERAPRED UNI-PAK 21 TAB) 10 MG (21) TBPK tablet Take 10 mg by mouth daily. Reported on 10/22/2015    Historical Provider, MD  rosuvastatin (CRESTOR) 10 MG tablet Take 10 mg by mouth daily.    Historical Provider, MD  sulfamethoxazole-trimethoprim (BACTRIM DS,SEPTRA DS) 800-160 MG per tablet Take 1 tablet by mouth 2 (two) times daily.    Historical Provider, MD  triamterene-hydrochlorothiazide (MAXZIDE-25) 37.5-25 MG per tablet Take 0.5 tablets by mouth daily.    Historical Provider, MD  zolpidem (AMBIEN) 10 MG tablet Take 10 mg by mouth at bedtime as needed. For insomnia.    Historical Provider, MD    Family History Family History  Problem Relation Age of Onset  . Heart failure Mother   . Diabetes Mother   . Arthritis Mother   . High blood pressure Mother   . Heart failure Father   . Colon cancer Neg Hx     Social History Social History  Substance Use Topics  . Smoking status: Former Smoker    Packs/day: 1.00    Years: 3.00    Types: Cigarettes  . Smokeless tobacco: Never Used     Comment: Quit 8 years ago.  . Alcohol use 8.4 oz/week    14 Standard drinks or equivalent per week     Comment: one drink a day     Allergies   Naprosyn [naproxen]   Review of Systems Review of Systems  All other systems reviewed and are negative.    Physical Exam Updated Vital Signs BP 132/72   Pulse (!) 51   Temp 98.3 F (36.8 C) (Oral)   Resp 18   Ht 6' (1.829 m)   Wt (!) 315 lb (142.9 kg)   SpO2 96%   BMI 42.72 kg/m   Physical Exam  Constitutional: He is oriented to person, place, and time. He appears well-developed. He appears distressed (Globally weak, raspy voice).  Morbidly obese  HENT:  Head: Normocephalic and atraumatic.  Right Ear: External ear normal.  Left Ear: External ear normal.  Eyes: Conjunctivae and EOM are normal. Pupils are equal, round, and reactive to light.  Neck: Normal range of motion and  phonation normal. Neck supple.  Cardiovascular: Normal rate, regular rhythm and normal heart sounds.   Pulmonary/Chest: Effort normal and breath sounds normal. He exhibits tenderness (Right lower lateral chest wall with ecchymosis, but no crepitation.). He exhibits no bony tenderness.  Abdominal: Soft. He exhibits no distension. There is no tenderness. There is no guarding.  Musculoskeletal: Normal range of motion. He exhibits no deformity.  Neurological: He is alert  and oriented to person, place, and time. No cranial nerve deficit or sensory deficit. He exhibits normal muscle tone. Coordination normal.  No dysarthria, aphasia or nystagmus  Skin: Skin is warm, dry and intact.  Psychiatric: He has a normal mood and affect. His behavior is normal. Judgment and thought content normal.  Nursing note and vitals reviewed.    ED Treatments / Results  Labs (all labs ordered are listed, but only abnormal results are displayed) Labs Reviewed  COMPREHENSIVE METABOLIC PANEL - Abnormal; Notable for the following:       Result Value   Glucose, Bld 103 (*)    BUN 25 (*)    Creatinine, Ser 1.66 (*)    Calcium 8.5 (*)    Total Protein 6.4 (*)    Albumin 3.2 (*)    GFR calc non Af Amer 38 (*)    GFR calc Af Amer 45 (*)    All other components within normal limits  CBC WITH DIFFERENTIAL/PLATELET - Abnormal; Notable for the following:    WBC 3.7 (*)    RBC 4.10 (*)    All other components within normal limits  URINALYSIS, ROUTINE W REFLEX MICROSCOPIC - Abnormal; Notable for the following:    Protein, ur 100 (*)    Squamous Epithelial / LPF 0-5 (*)    All other components within normal limits  URINE CULTURE  TROPONIN I    EKG  EKG Interpretation  Date/Time:  Friday May 01 2016 12:21:56 EST Ventricular Rate:  83 PR Interval:    QRS Duration: 121 QT Interval:  377 QTC Calculation: 441 R Axis:   -74 Text Interpretation:  Sinus rhythm Atrial premature complexes RBBB and LAFB Minimal ST  elevation, lateral leads since last tracing no significant change Confirmed by Eulis Foster  MD, Arloa Prak [phone]) on 05/01/2016 3:01:17 PM       Radiology Dg Chest 2 View  Result Date: 05/01/2016 CLINICAL DATA:  Productive cough, weakness. EXAM: CHEST  2 VIEW COMPARISON:  Radiographs of December 13, 2015. FINDINGS: Stable cardiomediastinal silhouette. No pneumothorax or pleural effusion is noted. Mild bibasilar subsegmental atelectasis is noted. Status post left shoulder arthroplasty. IMPRESSION: Mild bibasilar subsegmental atelectasis. Electronically Signed   By: Marijo Conception, M.D.   On: 05/01/2016 14:15   Dg Ribs Unilateral Right  Result Date: 05/01/2016 CLINICAL DATA:  Right rib pain. EXAM: RIGHT RIBS - 2 VIEW COMPARISON:  None. FINDINGS: No fracture or other bone lesions are seen involving the ribs. IMPRESSION: Normal right ribs. Electronically Signed   By: Marijo Conception, M.D.   On: 05/01/2016 14:17    Procedures Procedures (including critical care time)  Medications Ordered in ED Medications  sodium chloride 0.9 % bolus 1,000 mL (0 mLs Intravenous Stopped 05/01/16 1620)     Initial Impression / Assessment and Plan / ED Course  I have reviewed the triage vital signs and the nursing notes.  Pertinent labs & imaging results that were available during my care of the patient were reviewed by me and considered in my medical decision making (see chart for details).  Clinical Course     Medications  sodium chloride 0.9 % bolus 1,000 mL (0 mLs Intravenous Stopped 05/01/16 1620)    Patient Vitals for the past 24 hrs:  BP Temp Temp src Pulse Resp SpO2 Height Weight  05/01/16 1615 132/72 - - (!) 51 - 96 % - -  05/01/16 1500 129/75 - - 75 - 95 % - -  05/01/16 1330 124/83 - - -  18 - - -  05/01/16 1226 - - - - - - 6' (1.829 m) (!) 315 lb (142.9 kg)  05/01/16 1221 - - - 79 15 96 % - -  05/01/16 1220 120/71 - - - - - - -  05/01/16 1217 133/75 98.3 F (36.8 C) Oral 84 18 93 % - -     4:24 PM Reevaluation with update and discussion. After initial assessment and treatment, an updated evaluation reveals He is more comfortable now has been able to eat and drink. He has no further complaints. Vital signs are reassuring. Findings discussed with patient and wife, all questions answered. Edi Gorniak L    Final Clinical Impressions(s) / ED Diagnoses   Final diagnoses:  Pain  Fall, initial encounter  Right flank discomfort   Fall without syncope or serious injury. Creatinine mildly elevated from baseline, 1.49, about 2 years ago. Doubt serious bacterial infection, metabolic instability or impending vascular collapse.  Nursing Notes Reviewed/ Care Coordinated Applicable Imaging Reviewed Interpretation of Laboratory Data incorporated into ED treatment  The patient appears reasonably screened and/or stabilized for discharge and I doubt any other medical condition or other [facility] requiring further screening, evaluation, or treatment in the ED at this time prior to discharge.  Plan: Home Medications- continue; Home Treatments- increase oral fluids; return here if the recommended treatment, does not improve the symptoms; Recommended follow up- PCP 1 week and prn    New Prescriptions New Prescriptions   No medications on file     Daleen Bo, MD 05/01/16 1629

## 2016-05-01 NOTE — ED Notes (Signed)
Patient transported to X-ray 

## 2016-05-01 NOTE — ED Triage Notes (Signed)
Pt. Coming from PCP for weakness. Pt. C/o and weakness x4days. Pt. Getting chest xray and lost his balance and sat down. Pt. Denies hitting head or LOC. EMS reports rhonchi to left lower lobe. Pt. Aox4. Pt. Hx of afib. EMS reports 12 lead unremarkable.

## 2016-05-01 NOTE — Discharge Instructions (Signed)
There are no signs of serious illnesses, or injuries.  Your symptoms may have been due to some dehydration associated with decreased intake, since you have been sick.  Make sure you're drinking 1-2 L of water each day, getting plenty of rest and using Tylenol if needed for pain.  Since we don't  know exactly why you  fell, it will be a good idea to follow up with your primary care doctor for further evaluations. If you do not improve within a couple of days.

## 2016-05-02 LAB — URINE CULTURE

## 2016-05-07 ENCOUNTER — Encounter (INDEPENDENT_AMBULATORY_CARE_PROVIDER_SITE_OTHER): Payer: Self-pay | Admitting: Physical Medicine and Rehabilitation

## 2016-05-07 ENCOUNTER — Ambulatory Visit (INDEPENDENT_AMBULATORY_CARE_PROVIDER_SITE_OTHER): Payer: PPO | Admitting: Physical Medicine and Rehabilitation

## 2016-05-07 ENCOUNTER — Telehealth (INDEPENDENT_AMBULATORY_CARE_PROVIDER_SITE_OTHER): Payer: Self-pay | Admitting: Physical Medicine and Rehabilitation

## 2016-05-07 VITALS — BP 129/72 | HR 56

## 2016-05-07 DIAGNOSIS — M47816 Spondylosis without myelopathy or radiculopathy, lumbar region: Secondary | ICD-10-CM

## 2016-05-07 DIAGNOSIS — G8929 Other chronic pain: Secondary | ICD-10-CM | POA: Diagnosis not present

## 2016-05-07 DIAGNOSIS — M545 Low back pain: Secondary | ICD-10-CM

## 2016-05-07 DIAGNOSIS — M5116 Intervertebral disc disorders with radiculopathy, lumbar region: Secondary | ICD-10-CM

## 2016-05-07 DIAGNOSIS — G894 Chronic pain syndrome: Secondary | ICD-10-CM | POA: Diagnosis not present

## 2016-05-07 NOTE — Progress Notes (Signed)
Carlos Hanson - 76 y.o. male MRN [id number]  Date of birth: 04-Oct-1940  Office Visit Note: Visit Date: 05/07/2016 PCP: Donnajean Lopes, MD Referred by: Leanna Battles, MD  Subjective: Chief Complaint  Patient presents with  . Lower Back - Pain   HPI: Carlos Hanson is a 76 year old gentleman unfortunately morbidly obese with chronic low back pain. He has 3 level X-Stop procedure which has been performed by Dr. Ellene Route in the past. Patient has MRI evidence of facet arthropathy particularly L3-for L5-S1. We have completed radiofrequency ablation of the L3-for an L4-5 facet joints. Despite his body habitus of felt like we did get good positioning with the cannula was in good stimulation. Unfortunately, he is here today for normal follow-up post-radiofrequency ablation which we do have 77 month. Typically most folks have seen relief at the 1 month timeframe. The medial branch nerves continue to denervate throughout this time frame. He feels like he did have some initial relief for about the first week after the procedure. I did talk to him of this is likely medication effect. He has not gotten much relief at this point. He feels like the pain is about as strong as it was prior to the procedure. Pain increases with walking. Both sides are about the same- hurts across back- says it's hard to tell which side is the worst. He cannot stand any longer than 5 minutes before really starts to bother him. He's had some recent sickness with upper respiratory illness. He is actually had a couple falls that were documented in the charts and actually went to the ER with sickness. His wife is sick right now. He's doing a little bit better overall. He points to is really at the lumbar sacral junction. It's largely identified again with his body habitus. No radicular complaints the leg pain no foot pain no numbness tingling or dysesthesia. No focal weakness. He continues to take medications including opioids.     Review  of Systems  Constitutional: Negative for chills, fever, malaise/fatigue and weight loss.  HENT: Positive for congestion. Negative for hearing loss and sinus pain.   Eyes: Positive for redness. Negative for blurred vision, double vision and photophobia.  Respiratory: Negative for shortness of breath.   Cardiovascular: Negative for chest pain, palpitations and leg swelling.  Gastrointestinal: Negative for abdominal pain, nausea and vomiting.  Genitourinary: Negative for flank pain.  Musculoskeletal: Positive for back pain. Negative for myalgias.  Skin: Negative for itching and rash.  Neurological: Negative for tremors, focal weakness and weakness.  Endo/Heme/Allergies: Negative.   Psychiatric/Behavioral: Negative for depression.  All other systems reviewed and are negative.  Otherwise per HPI.  Assessment & Plan: Visit Diagnoses:  1. Spondylosis without myelopathy or radiculopathy, lumbar region   2. Radiculopathy due to lumbar intervertebral disc disorder   3. Chronic bilateral low back pain without sciatica   4. Chronic pain syndrome   5. Morbid obesity due to excess calories ([facility])     Plan: Findings:  Chronic pain syndrome on chronic opioids after having had X-Stop procedure at 3 levels but Dr. Ellene Route. MRI evidence of nothing in the way of real stenosis but he does have facet arthropathy particularly L3 S4 and L5-S1. I think this is likely facet arthropathy pain below the level of the X-Stop some below the level that we did the radiofrequency ablation. In hindsight we probably should've just on the bottom level as well but we placed this mainly on his symptoms appear to be coming from  and the MRI itself at the time. I would complete diagnostic facet joint blocks at L5-S1 at least one time to see. If it does seem to help we would look at radiofrequency ablation of that. I'm not going a lot of hope that he'll get relief from the prior radiofrequency ablation although he is still in the window  over the next week or so of seeing some relief from that and he'll give that a little bit of time to see. I would not change any of his medications at this point. He is to stay active when he can. There again is a lot of upper respiratory illness in the family needs just getting over this at this point. He does have a small disc protrusion at L5-S1 as well. This is noncompressive. Entertain the idea of a diagnostic epidural depending on his relief as well. I spent more than 15 minutes speaking face-to-face with the patient with 50% of the time in counseling.    Meds & Orders: No orders of the defined types were placed in this encounter.  No orders of the defined types were placed in this encounter.   Follow-up: Return for Planned bilateral L5-S1 facet joint blocks in a couple weeks unless relief from prior radiofrequency.   Procedures: No procedures performed  No notes on file   Clinical History: Lumbar spine MRI 08/10/2015  L1-L2: No significant disc bulge. No evidence of neural foraminal stenosis. No central canal stenosis.  L2-L3: Mild broad-based disc bulge. No evidence of neural foraminal stenosis. No central canal stenosis.  L3-L4: Mild broad-based disc bulge. Moderate bilateral facet arthropathy. Moderate right foraminal stenosis. No left foraminal stenosis. No central canal stenosis.  L4-L5: No significant disc bulge. No evidence of neural foraminal stenosis. No central canal stenosis. Osseous fusion of the posterior elements.  L5-S1: Small central disc protrusion. Moderate bilateral facet arthropathy. No evidence of neural foraminal stenosis. No central canal stenosis.  IMPRESSION: 1. Interspinous fixation at L3-4, L4-5 and L5-S1. 2. Mild lumbar spine spondylosis as described above.  He reports that he has quit smoking. His smoking use included Cigarettes. He has a 3.00 pack-year smoking history. He has never used smokeless tobacco. No results for input(s): HGBA1C,  LABURIC in the last 8760 hours.  Objective:  VS:  HT:    WT:   BMI:     BP:129/72  HR:(!) 56bpm  TEMP: ( )  RESP:  Physical Exam  Constitutional: He is oriented to person, place, and time. He appears well-developed and well-nourished. No distress.  Morbidly obese  HENT:  Head: Normocephalic and atraumatic.  Eyes: Conjunctivae are normal. Pupils are equal, round, and reactive to light.  Cardiovascular: Regular rhythm and intact distal pulses.   Pulmonary/Chest: Effort normal and breath sounds normal.  Musculoskeletal:  Patient is very stiff throughout the lumbar spine. He has some pain to palpation along the deep musculature between the lumbosacral junction. No pain over the greater trochanters no pain with hip rotation. Good distal strength. He is very slow to rise from a seated position and has great difficulty going from sit to stand.  Neurological: He is alert and oriented to person, place, and time.  Skin: Skin is warm.  Psychiatric: He has a normal mood and affect.    Ortho Exam Imaging: No results found.  Past Medical/Family/Surgical/Social History: Medications & Allergies reviewed per EMR Patient Active Problem List   Diagnosis Date Noted  . Morbid obesity due to excess calories ([place]) 05/16/2015  . Chronic pain  syndrome 05/16/2015  . OSA on CPAP 05/16/2015  . RLS (restless legs syndrome) 07/09/2014  . Paroxysmal atrial fibrillation ([place]) 07/09/2014  . Obesity hypoventilation syndrome ([place]) 07/09/2014  . OSA treated with BiPAP 07/09/2014  . Altered mental state 06/29/2014  . UTI (lower urinary tract infection) 06/29/2014    Class: Acute  . Back pain 06/29/2014    Class: Chronic  . Obstructive sleep apnea hypopnea, severe 06/29/2014    Class: Chronic  . Altered mental status 06/29/2014   Past Medical History:  Diagnosis Date  . Arthritis   . Chronic back pain   . Coronary artery disease    40% mLAD/DIAG bifurcation 03/25/10   . Dysrhythmia    History of  atrial fibrillation  . Gout   . H/O hiatal hernia   . Hearing decreased    right ear  . History of bladder infections   . History of bleeding ulcers   . History of ulcer disease   . Hypercholesteremia   . Hypertension   . Nocturia   . Obstructive sleep apnea hypopnea, severe   . Sleep apnea    cpap sleep study 2011   Family History  Problem Relation Age of Onset  . Heart failure Mother   . Diabetes Mother   . Arthritis Mother   . High blood pressure Mother   . Heart failure Father   . Colon cancer Neg Hx    Past Surgical History:  Procedure Laterality Date  . BACK SURGERY    . CARDIAC CATHETERIZATION     2011  . cardioverson    . COLONOSCOPY  08/27/2011   Procedure: COLONOSCOPY;  Surgeon: Rogene Houston, MD;  [facility];  Service: Endoscopy;  Laterality: N/A;  930  . ESOPHAGOGASTRODUODENOSCOPY    . JOINT REPLACEMENT     left shoulder  . Left shoulder replacement    . LUMBAR LAMINECTOMY  03/15/2012   Procedure: LUMBAR LAMINECTOMY WITH  X-STOP 2 LEVEL;  Surgeon: Kristeen Miss, MD;  Location: [facility];  Service: Neurosurgery;  Laterality: N/A;  Lumbar two-three, three-four XSTOP  . TONSILLECTOMY     Social History   Occupational History  .      Retired   Social History Main Topics  . Smoking status: Former Smoker    Packs/day: 1.00    Years: 3.00    Types: Cigarettes  . Smokeless tobacco: Never Used     Comment: Quit 8 years ago.  . Alcohol use 8.4 oz/week    14 Standard drinks or equivalent per week     Comment: one drink a day  . Drug use: No  . Sexual activity: Not on file

## 2016-05-08 NOTE — Telephone Encounter (Signed)
Faxed [employer] auth form with last 3 office notes to [phone]

## 2016-05-13 NOTE — Telephone Encounter (Signed)
Still pending per website 

## 2016-05-14 DIAGNOSIS — G4733 Obstructive sleep apnea (adult) (pediatric): Secondary | ICD-10-CM | POA: Diagnosis not present

## 2016-05-18 ENCOUNTER — Encounter (INDEPENDENT_AMBULATORY_CARE_PROVIDER_SITE_OTHER): Payer: PPO | Admitting: Physical Medicine and Rehabilitation

## 2016-05-18 NOTE — Telephone Encounter (Signed)
Still pending per website 

## 2016-05-22 NOTE — Telephone Encounter (Signed)
Received auth for [id number] and [phone]. [id number]. Good from 05/12/16-08/10/16.

## 2016-05-25 ENCOUNTER — Encounter (INDEPENDENT_AMBULATORY_CARE_PROVIDER_SITE_OTHER): Payer: Self-pay | Admitting: Physical Medicine and Rehabilitation

## 2016-05-25 ENCOUNTER — Ambulatory Visit (INDEPENDENT_AMBULATORY_CARE_PROVIDER_SITE_OTHER): Payer: PPO | Admitting: Physical Medicine and Rehabilitation

## 2016-05-25 VITALS — BP 123/79 | HR 74 | Temp 98.1°F

## 2016-05-25 DIAGNOSIS — M47816 Spondylosis without myelopathy or radiculopathy, lumbar region: Secondary | ICD-10-CM

## 2016-05-25 MED ORDER — METHYLPREDNISOLONE ACETATE 80 MG/ML IJ SUSP
80.0000 mg | Freq: Once | INTRAMUSCULAR | Status: AC
  Administered 2016-05-25: 80 mg

## 2016-05-25 MED ORDER — LIDOCAINE HCL (PF) 1 % IJ SOLN
0.3300 mL | Freq: Once | INTRAMUSCULAR | Status: AC
  Administered 2016-05-25: 0.3 mL

## 2016-05-25 NOTE — Progress Notes (Signed)
Carlos Hanson - 76 y.o. male MRN [id number]  Date of birth: 06-18-1940  Office Visit Note: Visit Date: 05/25/2016 PCP: Donnajean Lopes, MD Referred by: Leanna Battles, MD  Subjective: Chief Complaint  Patient presents with  . Lower Back - Pain   HPI: Carlos Hanson is a pleasant 76 year old gentleman complicated by general deconditioning with chronic history of low back pain on opioid medication. He has had 3 level X-Stop procedure performed by Dr. Ellene Route. We completed facet joint blocks which were diagnostic at L3 Devashwar L4-5 but he has since gone to have radiofrequency ablation which has not been beneficial. He does have facet arthropathy below the level of the X-Stop's. We are now try diagnostic L5-S1 facet joint blocks. Overall for his bilateral pain. Patient is here today for planned bilateral L5-S1 facet injection. No change in symptoms.    ROS Otherwise per HPI.  Assessment & Plan: Visit Diagnoses:  1. Spondylosis without myelopathy or radiculopathy, lumbar region     Plan: Findings:  Bilateral L5-S1 facet joint block diagnostically and therapeutically. Please see our prior evaluation and management note for further details and justification.    Meds & Orders:  Meds ordered this encounter  Medications  . lidocaine (PF) (XYLOCAINE) 1 % injection 0.3 mL  . methylPREDNISolone acetate (DEPO-MEDROL) injection 80 mg    Orders Placed This Encounter  Procedures  . Facet Injection    Follow-up: Return if symptoms worsen or fail to improve after 2 weeks.   Procedures: No procedures performed  Lumbar Facet Joint Intra-Articular Injection(s) with Fluoroscopic Guidance  Patient: Carlos Hanson      Date of Birth: 10-03-1940 MRN: [id number] PCP: Donnajean Lopes, MD      Visit Date: 05/25/2016   Universal Protocol:    Date/Time: 05/25/1808:55 AM  Consent Given By: the patient  Position: PRONE   Additional Comments: Vital signs were monitored before and after the  procedure. Patient was prepped and draped in the usual sterile fashion. The correct patient, procedure, and site was verified.   Injection Procedure Details:  Procedure Site One Meds Administered:  Meds ordered this encounter  Medications  . lidocaine (PF) (XYLOCAINE) 1 % injection 0.3 mL  . methylPREDNISolone acetate (DEPO-MEDROL) injection 80 mg     Laterality: Bilateral  Location/Site:  L5-S1  Needle size: 22 guage  Needle type: Spinal  Needle Placement: Articular  Findings:  -Contrast Used: 2 mL iohexol 180 mg iodine/mL   -Comments: Excellent flow of contrast producing a partial arthrogram.  Procedure Details: The fluoroscope beam is vertically oriented in AP, and the inferior recess is visualized beneath the lower pole of the inferior apophyseal process, which represents the target point for needle insertion. When direct visualization is difficult the target point is located at the medial projection of the vertebral pedicle. The region overlying each aforementioned target is locally anesthetized with a 1 to 2 ml. volume of 1% Lidocaine without Epinephrine.   The spinal needle was inserted into each of the above mentioned facet joints using biplanar fluoroscopic guidance. A 0.25 to 0.5 ml. volume of Isovue-250 was injected and a partial facet joint arthrogram was obtained. A single spot film was obtained of the resulting arthrogram.    One to 1.25 ml of the steroid/anesthetic solution was then injected into each of the facet joints noted above.   Additional Comments:  The patient tolerated the procedure well Dressing: Band-Aid and 2x2 sterile gauze     Post-procedure details: Patient was observed during the procedure.  Post-procedure instructions were reviewed.  Patient left the clinic in stable condition.     Clinical History: Lumbar spine MRI 08/10/2015  L1-L2: No significant disc bulge. No evidence of neural foraminal stenosis. No central canal  stenosis.  L2-L3: Mild broad-based disc bulge. No evidence of neural foraminal stenosis. No central canal stenosis.  L3-L4: Mild broad-based disc bulge. Moderate bilateral facet arthropathy. Moderate right foraminal stenosis. No left foraminal stenosis. No central canal stenosis.  L4-L5: No significant disc bulge. No evidence of neural foraminal stenosis. No central canal stenosis. Osseous fusion of the posterior elements.  L5-S1: Small central disc protrusion. Moderate bilateral facet arthropathy. No evidence of neural foraminal stenosis. No central canal stenosis.  IMPRESSION: 1. Interspinous fixation at L3-4, L4-5 and L5-S1. 2. Mild lumbar spine spondylosis as described above.  He reports that he has quit smoking. His smoking use included Cigarettes. He has a 3.00 pack-year smoking history. He has never used smokeless tobacco. No results for input(s): HGBA1C, LABURIC in the last 8760 hours.  Objective:  VS:  HT:    WT:   BMI:     BP:123/79  HR:74bpm  TEMP:98.1 F (36.7 C)(Oral)  RESP:93 % Physical Exam  Musculoskeletal:  Patient is very slow to rise from a seated position. He has good distal strength and ambulates without aid.    Ortho Exam Imaging: No results found.  Past Medical/Family/Surgical/Social History: Medications & Allergies reviewed per EMR Patient Active Problem List   Diagnosis Date Noted  . Morbid obesity due to excess calories ([place]) 05/16/2015  . Chronic pain syndrome 05/16/2015  . OSA on CPAP 05/16/2015  . RLS (restless legs syndrome) 07/09/2014  . Paroxysmal atrial fibrillation ([place]) 07/09/2014  . Obesity hypoventilation syndrome ([place]) 07/09/2014  . OSA treated with BiPAP 07/09/2014  . Altered mental state 06/29/2014  . UTI (lower urinary tract infection) 06/29/2014    Class: Acute  . Back pain 06/29/2014    Class: Chronic  . Obstructive sleep apnea hypopnea, severe 06/29/2014    Class: Chronic  . Altered mental status 06/29/2014    Past Medical History:  Diagnosis Date  . Arthritis   . Chronic back pain   . Coronary artery disease    40% mLAD/DIAG bifurcation 03/25/10   . Dysrhythmia    History of atrial fibrillation  . Gout   . H/O hiatal hernia   . Hearing decreased    right ear  . History of bladder infections   . History of bleeding ulcers   . History of ulcer disease   . Hypercholesteremia   . Hypertension   . Nocturia   . Obstructive sleep apnea hypopnea, severe   . Sleep apnea    cpap sleep study 2011   Family History  Problem Relation Age of Onset  . Heart failure Mother   . Diabetes Mother   . Arthritis Mother   . High blood pressure Mother   . Heart failure Father   . Colon cancer Neg Hx    Past Surgical History:  Procedure Laterality Date  . BACK SURGERY    . CARDIAC CATHETERIZATION     2011  . cardioverson    . COLONOSCOPY  08/27/2011   Procedure: COLONOSCOPY;  Surgeon: Rogene Houston, MD;  [facility];  Service: Endoscopy;  Laterality: N/A;  930  . ESOPHAGOGASTRODUODENOSCOPY    . JOINT REPLACEMENT     left shoulder  . Left shoulder replacement    . LUMBAR LAMINECTOMY  03/15/2012   Procedure: LUMBAR  LAMINECTOMY WITH  X-STOP 2 LEVEL;  Surgeon: Kristeen Miss, MD;  Location: [place] [facility];  Service: Neurosurgery;  Laterality: N/A;  Lumbar two-three, three-four XSTOP  . TONSILLECTOMY     Social History   Occupational History  .      Retired   Social History Main Topics  . Smoking status: Former Smoker    Packs/day: 1.00    Years: 3.00    Types: Cigarettes  . Smokeless tobacco: Never Used     Comment: Quit 8 years ago.  . Alcohol use 8.4 oz/week    14 Standard drinks or equivalent per week     Comment: one drink a day  . Drug use: No  . Sexual activity: Not on file

## 2016-05-25 NOTE — Procedures (Signed)
Lumbar Facet Joint Intra-Articular Injection(s) with Fluoroscopic Guidance  Patient: Carlos Hanson      Date of Birth: Jun 21, 1940 MRN: [id number] PCP: Donnajean Lopes, MD      Visit Date: 05/25/2016   Universal Protocol:    Date/Time: 05/25/1808:55 AM  Consent Given By: the patient  Position: PRONE   Additional Comments: Vital signs were monitored before and after the procedure. Patient was prepped and draped in the usual sterile fashion. The correct patient, procedure, and site was verified.   Injection Procedure Details:  Procedure Site One Meds Administered:  Meds ordered this encounter  Medications  . lidocaine (PF) (XYLOCAINE) 1 % injection 0.3 mL  . methylPREDNISolone acetate (DEPO-MEDROL) injection 80 mg     Laterality: Bilateral  Location/Site:  L5-S1  Needle size: 22 guage  Needle type: Spinal  Needle Placement: Articular  Findings:  -Contrast Used: 2 mL iohexol 180 mg iodine/mL   -Comments: Excellent flow of contrast producing a partial arthrogram.  Procedure Details: The fluoroscope beam is vertically oriented in AP, and the inferior recess is visualized beneath the lower pole of the inferior apophyseal process, which represents the target point for needle insertion. When direct visualization is difficult the target point is located at the medial projection of the vertebral pedicle. The region overlying each aforementioned target is locally anesthetized with a 1 to 2 ml. volume of 1% Lidocaine without Epinephrine.   The spinal needle was inserted into each of the above mentioned facet joints using biplanar fluoroscopic guidance. A 0.25 to 0.5 ml. volume of Isovue-250 was injected and a partial facet joint arthrogram was obtained. A single spot film was obtained of the resulting arthrogram.    One to 1.25 ml of the steroid/anesthetic solution was then injected into each of the facet joints noted above.   Additional Comments:  The patient tolerated the  procedure well Dressing: Band-Aid and 2x2 sterile gauze     Post-procedure details: Patient was observed during the procedure. Post-procedure instructions were reviewed.  Patient left the clinic in stable condition.

## 2016-05-25 NOTE — Patient Instructions (Signed)

## 2016-06-08 DIAGNOSIS — R2689 Other abnormalities of gait and mobility: Secondary | ICD-10-CM | POA: Diagnosis not present

## 2016-06-08 DIAGNOSIS — Z6841 Body Mass Index (BMI) 40.0 and over, adult: Secondary | ICD-10-CM | POA: Diagnosis not present

## 2016-06-08 DIAGNOSIS — G4733 Obstructive sleep apnea (adult) (pediatric): Secondary | ICD-10-CM | POA: Diagnosis not present

## 2016-06-08 DIAGNOSIS — I480 Paroxysmal atrial fibrillation: Secondary | ICD-10-CM | POA: Diagnosis not present

## 2016-06-08 DIAGNOSIS — M545 Low back pain: Secondary | ICD-10-CM | POA: Diagnosis not present

## 2016-06-08 DIAGNOSIS — I10 Essential (primary) hypertension: Secondary | ICD-10-CM | POA: Diagnosis not present

## 2016-06-10 ENCOUNTER — Telehealth (INDEPENDENT_AMBULATORY_CARE_PROVIDER_SITE_OTHER): Payer: Self-pay | Admitting: Physical Medicine and Rehabilitation

## 2016-06-10 NOTE — Telephone Encounter (Signed)
If he can say it was substntially better then we should try RFA of that level L5-S1. Reasoning same as before to get longer length of relief. I would be willing to do that level in one sitting. Don't have to change the C-arm angle much.

## 2016-06-11 NOTE — Telephone Encounter (Signed)
Submitted auth request on [employer] website 

## 2016-06-19 NOTE — Telephone Encounter (Signed)
Still pending per website 

## 2016-06-25 NOTE — Telephone Encounter (Signed)
Received auth. [id number]. eff 06/11/16-09/09/16. Left message with pts wife for him to call back for scheduling.

## 2016-06-26 NOTE — Telephone Encounter (Signed)
Left another message for pt to call back for scheduling

## 2016-06-26 NOTE — Telephone Encounter (Signed)
Pt scheduled for 07/15/16 @ 3:30 w/driver

## 2016-07-02 ENCOUNTER — Telehealth: Payer: Self-pay | Admitting: Orthopaedic Surgery

## 2016-07-09 DIAGNOSIS — I10 Essential (primary) hypertension: Secondary | ICD-10-CM | POA: Diagnosis not present

## 2016-07-09 DIAGNOSIS — G4733 Obstructive sleep apnea (adult) (pediatric): Secondary | ICD-10-CM | POA: Diagnosis not present

## 2016-07-09 DIAGNOSIS — I480 Paroxysmal atrial fibrillation: Secondary | ICD-10-CM | POA: Diagnosis not present

## 2016-07-09 DIAGNOSIS — I130 Hypertensive heart and chronic kidney disease with heart failure and stage 1 through stage 4 chronic kidney disease, or unspecified chronic kidney disease: Secondary | ICD-10-CM | POA: Diagnosis not present

## 2016-07-15 ENCOUNTER — Ambulatory Visit (INDEPENDENT_AMBULATORY_CARE_PROVIDER_SITE_OTHER): Payer: Self-pay

## 2016-07-15 ENCOUNTER — Ambulatory Visit (INDEPENDENT_AMBULATORY_CARE_PROVIDER_SITE_OTHER): Payer: PPO | Admitting: Physical Medicine and Rehabilitation

## 2016-07-15 ENCOUNTER — Encounter (INDEPENDENT_AMBULATORY_CARE_PROVIDER_SITE_OTHER): Payer: Self-pay | Admitting: Physical Medicine and Rehabilitation

## 2016-07-15 VITALS — BP 147/78 | HR 77 | Temp 98.0°F

## 2016-07-15 DIAGNOSIS — M47816 Spondylosis without myelopathy or radiculopathy, lumbar region: Secondary | ICD-10-CM

## 2016-07-15 MED ORDER — LIDOCAINE HCL (PF) 1 % IJ SOLN
0.3300 mL | Freq: Once | INTRAMUSCULAR | Status: AC
  Administered 2016-07-15: 0.3 mL

## 2016-07-15 MED ORDER — METHYLPREDNISOLONE ACETATE 80 MG/ML IJ SUSP
80.0000 mg | Freq: Once | INTRAMUSCULAR | Status: AC
  Administered 2016-07-15: 80 mg

## 2016-07-15 NOTE — Patient Instructions (Signed)

## 2016-07-15 NOTE — Progress Notes (Signed)
Carlos Hanson - 76 y.o. male MRN [id number]  Date of birth: December 11, 1940  Office Visit Note: Visit Date: 07/15/2016 PCP: Donnajean Lopes, MD Referred by: Leanna Battles, MD  Subjective: Chief Complaint  Patient presents with  . Lower Back - Pain   HPI: [name] is a 76 year old gentleman that we got to know quite well over the last several months. By way of review when I initially saw him he is having more right scapular pain and this was successfully treated with physical therapy and medication management. He is on chronic opioid management through his primary care physician and this is decreased somewhat but he still continues to take this. His low back pain has been a chronic long-term problem for him. He was diagnosed with stenosis and had X-Stop procedures performed at L3-4, L4-5 and L5-S1. Imaging of the time those were performed shows fairly mild stenosis but he does have facet arthropathy throughout the lumbar spine. I have performed radiofrequency ablation of the L3-4, L4-5 facet joints with moderate help but not great help with his axial low back pain. His axial low back pain is very limiting he can't stand for very long. He's had physical therapy and medication management including opioids. He did complete medial branch blocks diagnostically of the L5-S1 facets and he did well without it has all documented. He is here today for radiofrequency ablation of the bilateral L5-S1 facet joints. Pain across lower back and worse on right side. No leg pain just weakness.    ROS Otherwise per HPI.  Assessment & Plan: Visit Diagnoses:  1. Spondylosis without myelopathy or radiculopathy, lumbar region     Plan: Findings:  Radiofrequency ablation of the bilateral L4 medial branches and L5 dorsal rami to denervate the L5-S1 facet joints.    Meds & Orders:  Meds ordered this encounter  Medications  . lidocaine (PF) (XYLOCAINE) 1 % injection 0.3 mL  . methylPREDNISolone acetate  (DEPO-MEDROL) injection 80 mg    Orders Placed This Encounter  Procedures  . Radiofrequency,Lumbar  . XR C-ARM NO REPORT    Follow-up: Return if symptoms worsen or fail to improve, 4 weeks.   Procedures: No procedures performed  Lumbar Facet Joint Nerve Denervation  Patient: Carlos Hanson      Date of Birth: 10-22-1940 MRN: [id number] PCP: Donnajean Lopes, MD      Visit Date: 07/15/2016   Universal Protocol:    Date/Time: 03/16/186:02 AM  Consent Given By: the patient  Position: PRONE  Additional Comments: Vital signs were monitored before and after the procedure. Patient was prepped and draped in the usual sterile fashion. The correct patient, procedure, and site was verified.   Injection Procedure Details:  Procedure Site One Meds Administered:  Meds ordered this encounter  Medications  . lidocaine (PF) (XYLOCAINE) 1 % injection 0.3 mL  . methylPREDNISolone acetate (DEPO-MEDROL) injection 80 mg     Laterality: Bilateral  Location/Site: Bilateral L4 medial branches and L5 dorsal rami L5-S1  Needle size: 18 G  Needle type: Radiofrequency cannula  Needle Placement: Along juncture of superior articular process and transverse pocess  Findings:  -Comments:  Procedure Details: For each desired target nerve, the corresponding transverse process (sacral ala for the L5 dorsal rami) was identified and the fluoroscope was positioned to square off the endplates of the corresponding vertebral body to achieve a true AP midline view.  The beam was then obliqued 15 to 20 degrees and caudally tilted 15 to 20 degrees to line up  a trajectory along the target nerves. The skin over the target of the junction of superior articulating process and transverse process (sacral ala for the L5 dorsal rami) was infiltrated with 53ml of 1% Lidocaine without Epinephrine.  The 18 gauge 3mm active tip outer cannula was advanced in trajectory view to the target.  This procedure was repeated for  each target nerve.  Then, for all levels, the outer cannula placement was fine-tuned and the position was then confirmed with bi-planar imaging.    Test stimulation was done both at sensory and motor levels to ensure there was no radicular stimulation. The target tissues were then infiltrated with 1 ml of 1% Lidocaine without Epinephrine. Subsequently, a percutaneous neurotomy was carried out for 60 seconds at 80 degrees Celsius. The procedure was repeated with the cannula rotated 90 degrees, for duration of 60 seconds, one additional time at each level for a total of two lesions per level.  After the completion of the two lesions, 1 ml of injectate was delivered. It was then repeated for each facet joint nerve mentioned above. Appropriate radiographs were obtained to verify the probe placement during the neurotomy.   Additional Comments:  The patient tolerated the procedure well No complications occurred Dressing: Band-Aid    Post-procedure details: Patient was observed during the procedure. Post-procedure instructions were reviewed.  Patient left the clinic in stable condition.      Clinical History: Lumbar spine MRI 08/10/2015  L1-L2: No significant disc bulge. No evidence of neural foraminal stenosis. No central canal stenosis.  L2-L3: Mild broad-based disc bulge. No evidence of neural foraminal stenosis. No central canal stenosis.  L3-L4: Mild broad-based disc bulge. Moderate bilateral facet arthropathy. Moderate right foraminal stenosis. No left foraminal stenosis. No central canal stenosis.  L4-L5: No significant disc bulge. No evidence of neural foraminal stenosis. No central canal stenosis. Osseous fusion of the posterior elements.  L5-S1: Small central disc protrusion. Moderate bilateral facet arthropathy. No evidence of neural foraminal stenosis. No central canal stenosis.  IMPRESSION: 1. Interspinous fixation at L3-4, L4-5 and L5-S1. 2. Mild lumbar spine  spondylosis as described above.  He reports that he has quit smoking. His smoking use included Cigarettes. He has a 3.00 pack-year smoking history. He has never used smokeless tobacco. No results for input(s): HGBA1C, LABURIC in the last 8760 hours.  Objective:  VS:  HT:    WT:   BMI:     BP:(!) 147/78  HR:77bpm  TEMP:98 F (36.7 C)(Oral)  RESP:94 % Physical Exam  Musculoskeletal:  Patient is very slow to rise from a seated position with pain upon extension rotation. He has good distal strength.    Ortho Exam Imaging: No results found.  Past Medical/Family/Surgical/Social History: Medications & Allergies reviewed per EMR Patient Active Problem List   Diagnosis Date Noted  . Morbid obesity due to excess calories ([place]) 05/16/2015  . Chronic pain syndrome 05/16/2015  . OSA on CPAP 05/16/2015  . RLS (restless legs syndrome) 07/09/2014  . Paroxysmal atrial fibrillation ([place]) 07/09/2014  . Obesity hypoventilation syndrome ([place]) 07/09/2014  . OSA treated with BiPAP 07/09/2014  . Altered mental state 06/29/2014  . UTI (lower urinary tract infection) 06/29/2014    Class: Acute  . Back pain 06/29/2014    Class: Chronic  . Obstructive sleep apnea hypopnea, severe 06/29/2014    Class: Chronic  . Altered mental status 06/29/2014   Past Medical History:  Diagnosis Date  . Arthritis   . Chronic back pain   .  Coronary artery disease    40% mLAD/DIAG bifurcation 03/25/10   . Dysrhythmia    History of atrial fibrillation  . Gout   . H/O hiatal hernia   . Hearing decreased    right ear  . History of bladder infections   . History of bleeding ulcers   . History of ulcer disease   . Hypercholesteremia   . Hypertension   . Nocturia   . Obstructive sleep apnea hypopnea, severe   . Sleep apnea    cpap sleep study 2011   Family History  Problem Relation Age of Onset  . Heart failure Mother   . Diabetes Mother   . Arthritis Mother   . High blood pressure Mother   . Heart  failure Father   . Colon cancer Neg Hx    Past Surgical History:  Procedure Laterality Date  . BACK SURGERY    . CARDIAC CATHETERIZATION     2011  . cardioverson    . COLONOSCOPY  08/27/2011   Procedure: COLONOSCOPY;  Surgeon: Rogene Houston, MD;  [facility];  Service: Endoscopy;  Laterality: N/A;  930  . ESOPHAGOGASTRODUODENOSCOPY    . JOINT REPLACEMENT     left shoulder  . Left shoulder replacement    . LUMBAR LAMINECTOMY  03/15/2012   Procedure: LUMBAR LAMINECTOMY WITH  X-STOP 2 LEVEL;  Surgeon: Kristeen Miss, MD;  Location: [place] [facility];  Service: Neurosurgery;  Laterality: N/A;  Lumbar two-three, three-four XSTOP  . TONSILLECTOMY     Social History   Occupational History  .      Retired   Social History Main Topics  . Smoking status: Former Smoker    Packs/day: 1.00    Years: 3.00    Types: Cigarettes  . Smokeless tobacco: Never Used     Comment: Quit 8 years ago.  . Alcohol use 8.4 oz/week    14 Standard drinks or equivalent per week     Comment: one drink a day  . Drug use: No  . Sexual activity: Not on file

## 2016-07-17 NOTE — Procedures (Signed)
Lumbar Facet Joint Nerve Denervation  Patient: Carlos Hanson      Date of Birth: 02-16-1941 MRN: [id number] PCP: Donnajean Lopes, MD      Visit Date: 07/15/2016   Universal Protocol:    Date/Time: 03/16/186:02 AM  Consent Given By: the patient  Position: PRONE  Additional Comments: Vital signs were monitored before and after the procedure. Patient was prepped and draped in the usual sterile fashion. The correct patient, procedure, and site was verified.   Injection Procedure Details:  Procedure Site One Meds Administered:  Meds ordered this encounter  Medications  . lidocaine (PF) (XYLOCAINE) 1 % injection 0.3 mL  . methylPREDNISolone acetate (DEPO-MEDROL) injection 80 mg     Laterality: Bilateral  Location/Site: Bilateral L4 medial branches and L5 dorsal rami L5-S1  Needle size: 18 G  Needle type: Radiofrequency cannula  Needle Placement: Along juncture of superior articular process and transverse pocess  Findings:  -Comments:  Procedure Details: For each desired target nerve, the corresponding transverse process (sacral ala for the L5 dorsal rami) was identified and the fluoroscope was positioned to square off the endplates of the corresponding vertebral body to achieve a true AP midline view.  The beam was then obliqued 15 to 20 degrees and caudally tilted 15 to 20 degrees to line up a trajectory along the target nerves. The skin over the target of the junction of superior articulating process and transverse process (sacral ala for the L5 dorsal rami) was infiltrated with 80ml of 1% Lidocaine without Epinephrine.  The 18 gauge 33mm active tip outer cannula was advanced in trajectory view to the target.  This procedure was repeated for each target nerve.  Then, for all levels, the outer cannula placement was fine-tuned and the position was then confirmed with bi-planar imaging.    Test stimulation was done both at sensory and motor levels to ensure there was no  radicular stimulation. The target tissues were then infiltrated with 1 ml of 1% Lidocaine without Epinephrine. Subsequently, a percutaneous neurotomy was carried out for 60 seconds at 80 degrees Celsius. The procedure was repeated with the cannula rotated 90 degrees, for duration of 60 seconds, one additional time at each level for a total of two lesions per level.  After the completion of the two lesions, 1 ml of injectate was delivered. It was then repeated for each facet joint nerve mentioned above. Appropriate radiographs were obtained to verify the probe placement during the neurotomy.   Additional Comments:  The patient tolerated the procedure well No complications occurred Dressing: Band-Aid    Post-procedure details: Patient was observed during the procedure. Post-procedure instructions were reviewed.  Patient left the clinic in stable condition.

## 2016-08-04 DIAGNOSIS — N390 Urinary tract infection, site not specified: Secondary | ICD-10-CM | POA: Diagnosis not present

## 2016-08-04 DIAGNOSIS — N183 Chronic kidney disease, stage 3 (moderate): Secondary | ICD-10-CM | POA: Diagnosis not present

## 2016-08-04 DIAGNOSIS — Z125 Encounter for screening for malignant neoplasm of prostate: Secondary | ICD-10-CM | POA: Diagnosis not present

## 2016-08-04 DIAGNOSIS — E784 Other hyperlipidemia: Secondary | ICD-10-CM | POA: Diagnosis not present

## 2016-08-04 DIAGNOSIS — R7301 Impaired fasting glucose: Secondary | ICD-10-CM | POA: Diagnosis not present

## 2016-08-04 DIAGNOSIS — M109 Gout, unspecified: Secondary | ICD-10-CM | POA: Diagnosis not present

## 2016-08-04 DIAGNOSIS — R8299 Other abnormal findings in urine: Secondary | ICD-10-CM | POA: Diagnosis not present

## 2016-08-10 DIAGNOSIS — G4733 Obstructive sleep apnea (adult) (pediatric): Secondary | ICD-10-CM | POA: Diagnosis not present

## 2016-08-10 DIAGNOSIS — F329 Major depressive disorder, single episode, unspecified: Secondary | ICD-10-CM | POA: Insufficient documentation

## 2016-08-10 DIAGNOSIS — R2689 Other abnormalities of gait and mobility: Secondary | ICD-10-CM | POA: Diagnosis not present

## 2016-08-10 DIAGNOSIS — R5381 Other malaise: Secondary | ICD-10-CM | POA: Diagnosis not present

## 2016-08-10 DIAGNOSIS — Z6841 Body Mass Index (BMI) 40.0 and over, adult: Secondary | ICD-10-CM | POA: Diagnosis not present

## 2016-08-10 DIAGNOSIS — M545 Low back pain: Secondary | ICD-10-CM | POA: Diagnosis not present

## 2016-08-10 DIAGNOSIS — R413 Other amnesia: Secondary | ICD-10-CM | POA: Diagnosis not present

## 2016-08-10 DIAGNOSIS — I480 Paroxysmal atrial fibrillation: Secondary | ICD-10-CM | POA: Diagnosis not present

## 2016-08-10 DIAGNOSIS — Z1389 Encounter for screening for other disorder: Secondary | ICD-10-CM | POA: Diagnosis not present

## 2016-08-10 DIAGNOSIS — Z0000 Encounter for general adult medical examination without abnormal findings: Secondary | ICD-10-CM | POA: Diagnosis not present

## 2016-08-10 DIAGNOSIS — R7301 Impaired fasting glucose: Secondary | ICD-10-CM | POA: Diagnosis not present

## 2016-08-10 DIAGNOSIS — R808 Other proteinuria: Secondary | ICD-10-CM | POA: Diagnosis not present

## 2016-08-14 ENCOUNTER — Encounter (INDEPENDENT_AMBULATORY_CARE_PROVIDER_SITE_OTHER): Payer: Self-pay | Admitting: *Deleted

## 2016-08-19 ENCOUNTER — Telehealth (INDEPENDENT_AMBULATORY_CARE_PROVIDER_SITE_OTHER): Payer: Self-pay | Admitting: Physical Medicine and Rehabilitation

## 2016-08-20 NOTE — Telephone Encounter (Signed)
With MRI findings which are negative for nerve compression, the prior inneffective Xstop procedures by [facility]sner and now RFA failure there is not much more for interventional approach other than Spinal Cord stimulator trial.  Spinal surgery would be more of a fusion surgery which is hard to recommend but he could seek surgical advice.  Alternatively, he could f/up with Philip Aspen for medication suggestions and continue to try weight loss and increase mobility.

## 2016-08-20 NOTE — Telephone Encounter (Signed)
Called patient to advise. He said he will follow up with Dr. Philip Aspen. He will call us if he needs anything else or has further questions or problems.

## 2016-09-09 DIAGNOSIS — G4733 Obstructive sleep apnea (adult) (pediatric): Secondary | ICD-10-CM | POA: Diagnosis not present

## 2016-10-26 DIAGNOSIS — I480 Paroxysmal atrial fibrillation: Secondary | ICD-10-CM | POA: Diagnosis not present

## 2016-10-26 DIAGNOSIS — Z79899 Other long term (current) drug therapy: Secondary | ICD-10-CM | POA: Diagnosis not present

## 2016-10-26 DIAGNOSIS — E559 Vitamin D deficiency, unspecified: Secondary | ICD-10-CM | POA: Diagnosis not present

## 2016-10-26 DIAGNOSIS — N390 Urinary tract infection, site not specified: Secondary | ICD-10-CM | POA: Diagnosis not present

## 2016-10-26 DIAGNOSIS — Z6841 Body Mass Index (BMI) 40.0 and over, adult: Secondary | ICD-10-CM | POA: Diagnosis not present

## 2016-10-26 DIAGNOSIS — I10 Essential (primary) hypertension: Secondary | ICD-10-CM | POA: Diagnosis not present

## 2016-10-26 DIAGNOSIS — M545 Low back pain: Secondary | ICD-10-CM | POA: Diagnosis not present

## 2016-10-26 DIAGNOSIS — Z1321 Encounter for screening for nutritional disorder: Secondary | ICD-10-CM | POA: Diagnosis not present

## 2016-10-26 DIAGNOSIS — N183 Chronic kidney disease, stage 3 (moderate): Secondary | ICD-10-CM | POA: Diagnosis not present

## 2016-10-26 DIAGNOSIS — R350 Frequency of micturition: Secondary | ICD-10-CM | POA: Diagnosis not present

## 2016-10-26 DIAGNOSIS — R5381 Other malaise: Secondary | ICD-10-CM | POA: Diagnosis not present

## 2016-11-09 DIAGNOSIS — I480 Paroxysmal atrial fibrillation: Secondary | ICD-10-CM | POA: Diagnosis not present

## 2016-11-09 DIAGNOSIS — R7301 Impaired fasting glucose: Secondary | ICD-10-CM | POA: Diagnosis not present

## 2016-11-09 DIAGNOSIS — M545 Low back pain: Secondary | ICD-10-CM | POA: Diagnosis not present

## 2016-11-09 DIAGNOSIS — Z6841 Body Mass Index (BMI) 40.0 and over, adult: Secondary | ICD-10-CM | POA: Diagnosis not present

## 2016-11-09 DIAGNOSIS — R2689 Other abnormalities of gait and mobility: Secondary | ICD-10-CM | POA: Diagnosis not present

## 2016-11-09 DIAGNOSIS — R5381 Other malaise: Secondary | ICD-10-CM | POA: Diagnosis not present

## 2016-11-09 DIAGNOSIS — G4733 Obstructive sleep apnea (adult) (pediatric): Secondary | ICD-10-CM | POA: Diagnosis not present

## 2016-11-09 DIAGNOSIS — I10 Essential (primary) hypertension: Secondary | ICD-10-CM | POA: Diagnosis not present

## 2016-11-14 ENCOUNTER — Encounter (HOSPITAL_COMMUNITY): Payer: Self-pay | Admitting: Emergency Medicine

## 2016-11-14 ENCOUNTER — Emergency Department (HOSPITAL_COMMUNITY): Payer: PPO

## 2016-11-14 ENCOUNTER — Emergency Department (HOSPITAL_COMMUNITY)
Admission: EM | Admit: 2016-11-14 | Discharge: 2016-11-14 | Disposition: A | Discharge status: Home / Self Care | Payer: PPO | Attending: Emergency Medicine | Admitting: Emergency Medicine

## 2016-11-14 DIAGNOSIS — I10 Essential (primary) hypertension: Secondary | ICD-10-CM | POA: Diagnosis not present

## 2016-11-14 DIAGNOSIS — S0003XA Contusion of scalp, initial encounter: Secondary | ICD-10-CM | POA: Insufficient documentation

## 2016-11-14 DIAGNOSIS — Y9301 Activity, walking, marching and hiking: Secondary | ICD-10-CM | POA: Insufficient documentation

## 2016-11-14 DIAGNOSIS — Y92511 Restaurant or cafe as the place of occurrence of the external cause: Secondary | ICD-10-CM | POA: Insufficient documentation

## 2016-11-14 DIAGNOSIS — S0990XA Unspecified injury of head, initial encounter: Secondary | ICD-10-CM | POA: Diagnosis not present

## 2016-11-14 DIAGNOSIS — G8929 Other chronic pain: Secondary | ICD-10-CM | POA: Diagnosis not present

## 2016-11-14 DIAGNOSIS — Z87891 Personal history of nicotine dependence: Secondary | ICD-10-CM | POA: Diagnosis not present

## 2016-11-14 DIAGNOSIS — H578 Other specified disorders of eye and adnexa: Secondary | ICD-10-CM | POA: Diagnosis not present

## 2016-11-14 DIAGNOSIS — Z7901 Long term (current) use of anticoagulants: Secondary | ICD-10-CM | POA: Insufficient documentation

## 2016-11-14 DIAGNOSIS — S199XXA Unspecified injury of neck, initial encounter: Secondary | ICD-10-CM | POA: Diagnosis not present

## 2016-11-14 DIAGNOSIS — M546 Pain in thoracic spine: Secondary | ICD-10-CM | POA: Diagnosis not present

## 2016-11-14 DIAGNOSIS — Z79899 Other long term (current) drug therapy: Secondary | ICD-10-CM | POA: Diagnosis not present

## 2016-11-14 DIAGNOSIS — W01198A Fall on same level from slipping, tripping and stumbling with subsequent striking against other object, initial encounter: Secondary | ICD-10-CM | POA: Diagnosis not present

## 2016-11-14 DIAGNOSIS — Y999 Unspecified external cause status: Secondary | ICD-10-CM | POA: Diagnosis not present

## 2016-11-14 DIAGNOSIS — R52 Pain, unspecified: Secondary | ICD-10-CM | POA: Diagnosis not present

## 2016-11-14 NOTE — ED Provider Notes (Signed)
[facility] DEPT Provider Note   CSN: [id number] Arrival date & time: 11/14/16  1103     History   Chief Complaint Chief Complaint  Patient presents with  . Fall    HPI Carlos Hanson is a 76 y.o. male.  HPI  The patient is a 76 year old male, he has known history of coronary disease, chronic pain, paroxysmal atrial fibrillation for which she takes Pradaxa. According to the patient he was in his usual state of health when he was at a restaurant this afternoon, lost his balance and fell striking his left forehead on a post. He reports acute onset of pain in the head and the neck, no numbness or weakness, he was able to get up by himself without any difficulty, denies any difficulty with gait or balance, no visual changes, mild persistent headache. This occurred just prior to arrival, the symptoms are persistent. No associated vomiting blurred vision weakness numbness or memory loss. There was no loss of consciousness.  Past Medical History:  Diagnosis Date  . Arthritis   . Chronic back pain   . Coronary artery disease    40% mLAD/DIAG bifurcation 03/25/10   . Dysrhythmia    History of atrial fibrillation  . Gout   . H/O hiatal hernia   . Hearing decreased    right ear  . History of bladder infections   . History of bleeding ulcers   . History of ulcer disease   . Hypercholesteremia   . Hypertension   . Nocturia   . Obstructive sleep apnea hypopnea, severe   . Sleep apnea    cpap sleep study 2011    Patient Active Problem List   Diagnosis Date Noted  . Morbid obesity due to excess calories ([place]) 05/16/2015  . Chronic pain syndrome 05/16/2015  . OSA on CPAP 05/16/2015  . RLS (restless legs syndrome) 07/09/2014  . Paroxysmal atrial fibrillation ([place]) 07/09/2014  . Obesity hypoventilation syndrome ([place]) 07/09/2014  . OSA treated with BiPAP 07/09/2014  . Altered mental state 06/29/2014  . UTI (lower urinary tract infection) 06/29/2014    Class: Acute  . Back pain  06/29/2014    Class: Chronic  . Obstructive sleep apnea hypopnea, severe 06/29/2014    Class: Chronic  . Altered mental status 06/29/2014    Past Surgical History:  Procedure Laterality Date  . BACK SURGERY    . CARDIAC CATHETERIZATION     2011  . cardioverson    . COLONOSCOPY  08/27/2011   Procedure: COLONOSCOPY;  Surgeon: Rogene Houston, MD;  Location: [facility];  Service: Endoscopy;  Laterality: N/A;  930  . ESOPHAGOGASTRODUODENOSCOPY    . JOINT REPLACEMENT     left shoulder  . Left shoulder replacement    . LUMBAR LAMINECTOMY  03/15/2012   Procedure: LUMBAR LAMINECTOMY WITH  X-STOP 2 LEVEL;  Surgeon: Kristeen Miss, MD;  Location: [facility];  Service: Neurosurgery;  Laterality: N/A;  Lumbar two-three, three-four XSTOP  . TONSILLECTOMY         Home Medications    Prior to Admission medications   Medication Sig Start Date End Date Taking? Authorizing Provider  allopurinol (ZYLOPRIM) 300 MG tablet TAKE 1 TABLET BY MOUTH EVERY DAY 07/02/16   Sanjuana Kava, MD  amiodarone (PACERONE) 200 MG tablet Take 100 mg by mouth daily.     [provider]  clotrimazole (LOTRIMIN) 1 % cream Apply topically 2 (two) times daily. 07/01/14   Leanna Battles, MD  dabigatran (PRADAXA) 150 MG  CAPS Take 150 mg by mouth every 12 (twelve) hours.    [provider]  diltiazem (DILACOR XR) 180 MG 24 hr capsule Take 180 mg by mouth daily.    [provider]  DULoxetine (CYMBALTA) 30 MG capsule Take 60 mg by mouth daily.    [provider]  morphine (MSIR) 15 MG tablet Take 15 mg by mouth 2 (two) times daily.    [provider]  oxyCODONE-acetaminophen (PERCOCET) 10-325 MG per tablet Take 1 tablet by mouth 3 (three) times daily as needed for pain.    [provider]  oxymetazoline (AFRIN) 0.05 % nasal spray Place 1 spray into both nostrils at bedtime as needed for congestion (sinuses).    [provider]  pantoprazole (PROTONIX) 40 MG  tablet Take 40 mg by mouth 2 (two) times daily.    [provider]  predniSONE (STERAPRED UNI-PAK 21 TAB) 10 MG (21) TBPK tablet Take 10 mg by mouth daily. Reported on 10/22/2015    [provider]  rosuvastatin (CRESTOR) 10 MG tablet Take 10 mg by mouth daily.    [provider]  sulfamethoxazole-trimethoprim (BACTRIM DS,SEPTRA DS) 800-160 MG per tablet Take 1 tablet by mouth 2 (two) times daily.    [provider]  triamterene-hydrochlorothiazide (MAXZIDE-25) 37.5-25 MG per tablet Take 0.5 tablets by mouth daily.    [provider]  zolpidem (AMBIEN) 10 MG tablet Take 10 mg by mouth at bedtime as needed. For insomnia.    [provider]    Family History Family History  Problem Relation Age of Onset  . Heart failure Mother   . Diabetes Mother   . Arthritis Mother   . High blood pressure Mother   . Heart failure Father   . Colon cancer Neg Hx     Social History Social History  Substance Use Topics  . Smoking status: Former Smoker    Packs/day: 1.00    Years: 3.00    Types: Cigarettes  . Smokeless tobacco: Never Used     Comment: Quit 8 years ago.  . Alcohol use 8.4 oz/week    14 Standard drinks or equivalent per week     Comment: one drink a day     Allergies   Naprosyn [naproxen]   Review of Systems Review of Systems  All other systems reviewed and are negative.    Physical Exam Updated Vital Signs BP (!) 151/93 (BP Location: Left Arm)   Pulse 79   Temp 98.1 F (36.7 C) (Oral)   Resp 16   Ht 6' (1.829 m)   Wt (!) 145.2 kg (320 lb)   SpO2 95%   BMI 43.40 kg/m   Physical Exam  Constitutional: He appears well-developed and well-nourished. No distress.  HENT:  Head: Normocephalic.  Mouth/Throat: Oropharynx is clear and moist. No oropharyngeal exudate.  5 cm hematoma to the left forehead  Eyes: Pupils are equal, round, and reactive to light. Conjunctivae and EOM are normal. Right eye exhibits no  discharge. Left eye exhibits no discharge. No scleral icterus.  Neck: Normal range of motion. Neck supple. No JVD present. No thyromegaly present.  Cardiovascular: Normal rate, regular rhythm, normal heart sounds and intact distal pulses.  Exam reveals no gallop and no friction rub.   No murmur heard. Pulmonary/Chest: Effort normal and breath sounds normal. No respiratory distress. He has no wheezes. He has no rales.  Abdominal: Soft. Bowel sounds are normal. He exhibits no distension and no mass. There is  no tenderness.  Very obese but no tenderness over the abdominal wall  Musculoskeletal: Normal range of motion. He exhibits no edema or tenderness.  No deformities to the 4 extremities, no tenderness with range of motion, joints are diffusely supple, compartments are diffusely soft, small abrasion to the right elbow  Lymphadenopathy:    He has no cervical adenopathy.  Neurological: He is alert. Coordination normal.  Follows all commands, moves all 4 extremities without difficulty, normal strength coordination speech and memory.  Skin: Skin is warm and dry. No rash noted. No erythema.  Psychiatric: He has a normal mood and affect. His behavior is normal.  Nursing note and vitals reviewed.    ED Treatments / Results  Labs (all labs ordered are listed, but only abnormal results are displayed) Labs Reviewed - No data to display   Radiology Ct Head Wo Contrast  Result Date: 11/14/2016 CLINICAL DATA:  Fall EXAM: CT HEAD WITHOUT CONTRAST CT CERVICAL SPINE WITHOUT CONTRAST TECHNIQUE: Multidetector CT imaging of the head and cervical spine was performed following the standard protocol without intravenous contrast. Multiplanar CT image reconstructions of the cervical spine were also generated. COMPARISON:  06/29/2014 FINDINGS: CT HEAD FINDINGS Brain: There is atrophy and chronic small vessel disease changes. No acute intracranial abnormality. Specifically, no hemorrhage, hydrocephalus, mass  lesion, acute infarction, or significant intracranial injury. Vascular: No hyperdense vessel or unexpected calcification. Skull: No acute calvarial abnormality. Sinuses/Orbits: Mucosal thickening in the ethmoid air cells and maxillary sinuses. Mastoid air cells are clear. Orbital soft tissues unremarkable. Other: Soft tissue swelling over the left forehead. CT CERVICAL SPINE FINDINGS Alignment: Normal Skull base and vertebrae: No fracture Soft tissues and spinal canal: Prevertebral soft tissues are normal. No epidural or paraspinal hematoma. Disc levels: Disc space narrowing at C4-5 thru C6-7 with anterior spurring. Upper chest: Negative Other: None IMPRESSION: No acute intracranial abnormality. Atrophy, chronic small vessel disease. No acute bony abnormality in the cervical spine. Electronically Signed   By: Rolm Baptise M.D.   On: 11/14/2016 11:34   Ct Cervical Spine Wo Contrast  Result Date: 11/14/2016 CLINICAL DATA:  Fall EXAM: CT HEAD WITHOUT CONTRAST CT CERVICAL SPINE WITHOUT CONTRAST TECHNIQUE: Multidetector CT imaging of the head and cervical spine was performed following the standard protocol without intravenous contrast. Multiplanar CT image reconstructions of the cervical spine were also generated. COMPARISON:  06/29/2014 FINDINGS: CT HEAD FINDINGS Brain: There is atrophy and chronic small vessel disease changes. No acute intracranial abnormality. Specifically, no hemorrhage, hydrocephalus, mass lesion, acute infarction, or significant intracranial injury. Vascular: No hyperdense vessel or unexpected calcification. Skull: No acute calvarial abnormality. Sinuses/Orbits: Mucosal thickening in the ethmoid air cells and maxillary sinuses. Mastoid air cells are clear. Orbital soft tissues unremarkable. Other: Soft tissue swelling over the left forehead. CT CERVICAL SPINE FINDINGS Alignment: Normal Skull base and vertebrae: No fracture Soft tissues and spinal canal: Prevertebral soft tissues are normal. No  epidural or paraspinal hematoma. Disc levels: Disc space narrowing at C4-5 thru C6-7 with anterior spurring. Upper chest: Negative Other: None IMPRESSION: No acute intracranial abnormality. Atrophy, chronic small vessel disease. No acute bony abnormality in the cervical spine. Electronically Signed   By: Rolm Baptise M.D.   On: 11/14/2016 11:34    Procedures Procedures (including critical care time)  Medications Ordered in ED Medications - No data to display   Initial Impression / Assessment and Plan / ED Course  I have reviewed the triage vital signs and the nursing notes.  Pertinent labs & imaging  results that were available during my care of the patient were reviewed by me and considered in my medical decision making (see chart for details).     The patient had a fall, he appears to have minimal injury though he does have a hematoma which will need neuro imaging and a CT cervical spine as he does have some neck pain. He is anticoagulated. Otherwise well-appearing  CT neg Ice, elevate Pt informed Stable for d/c. Concussion likelyi  Final Clinical Impressions(s) / ED Diagnoses   Final diagnoses:  Injury of head, initial encounter  Contusion of scalp, initial encounter    New Prescriptions New Prescriptions   No medications on file     Noemi Chapel, MD 11/14/16 1223

## 2016-11-14 NOTE — ED Triage Notes (Signed)
Pt reports tripping and falling at a restaurant this morning.  C/o back pain and slight headache.  Hit head on metal pole.

## 2016-11-14 NOTE — ED Notes (Signed)
Spoke with pt's wife as his glasses were found.  States they are just over the counter readers and they will not return for them.  States to dispose of them as we wish.

## 2016-11-14 NOTE — Discharge Instructions (Signed)
Ice, elevate head Motrin or tylenol for pain Xrays normal

## 2016-11-26 DIAGNOSIS — H578 Other specified disorders of eye and adnexa: Secondary | ICD-10-CM | POA: Diagnosis not present

## 2016-12-22 DIAGNOSIS — G4733 Obstructive sleep apnea (adult) (pediatric): Secondary | ICD-10-CM | POA: Diagnosis not present

## 2016-12-29 DIAGNOSIS — Z6841 Body Mass Index (BMI) 40.0 and over, adult: Secondary | ICD-10-CM | POA: Diagnosis not present

## 2016-12-29 DIAGNOSIS — G4733 Obstructive sleep apnea (adult) (pediatric): Secondary | ICD-10-CM | POA: Diagnosis not present

## 2016-12-29 DIAGNOSIS — K7290 Hepatic failure, unspecified without coma: Secondary | ICD-10-CM | POA: Diagnosis not present

## 2016-12-29 DIAGNOSIS — M545 Low back pain: Secondary | ICD-10-CM | POA: Diagnosis not present

## 2016-12-29 DIAGNOSIS — R7301 Impaired fasting glucose: Secondary | ICD-10-CM | POA: Diagnosis not present

## 2016-12-29 DIAGNOSIS — R2689 Other abnormalities of gait and mobility: Secondary | ICD-10-CM | POA: Diagnosis not present

## 2017-01-04 ENCOUNTER — Telehealth: Payer: Self-pay | Admitting: Orthopaedic Surgery

## 2017-01-11 ENCOUNTER — Other Ambulatory Visit: Payer: Self-pay | Admitting: Orthopaedic Surgery

## 2017-02-22 DIAGNOSIS — Z6841 Body Mass Index (BMI) 40.0 and over, adult: Secondary | ICD-10-CM | POA: Diagnosis not present

## 2017-02-22 DIAGNOSIS — R2689 Other abnormalities of gait and mobility: Secondary | ICD-10-CM | POA: Diagnosis not present

## 2017-02-22 DIAGNOSIS — M545 Low back pain: Secondary | ICD-10-CM | POA: Diagnosis not present

## 2017-02-22 DIAGNOSIS — I480 Paroxysmal atrial fibrillation: Secondary | ICD-10-CM | POA: Diagnosis not present

## 2017-02-22 DIAGNOSIS — F1120 Opioid dependence, uncomplicated: Secondary | ICD-10-CM | POA: Diagnosis not present

## 2017-02-22 DIAGNOSIS — R1310 Dysphagia, unspecified: Secondary | ICD-10-CM | POA: Insufficient documentation

## 2017-02-22 DIAGNOSIS — M109 Gout, unspecified: Secondary | ICD-10-CM | POA: Diagnosis not present

## 2017-02-22 DIAGNOSIS — R7301 Impaired fasting glucose: Secondary | ICD-10-CM | POA: Diagnosis not present

## 2017-02-22 DIAGNOSIS — F3289 Other specified depressive episodes: Secondary | ICD-10-CM | POA: Diagnosis not present

## 2017-02-22 DIAGNOSIS — R1319 Other dysphagia: Secondary | ICD-10-CM | POA: Diagnosis not present

## 2017-02-22 DIAGNOSIS — G4733 Obstructive sleep apnea (adult) (pediatric): Secondary | ICD-10-CM | POA: Diagnosis not present

## 2017-02-22 DIAGNOSIS — K7290 Hepatic failure, unspecified without coma: Secondary | ICD-10-CM | POA: Diagnosis not present

## 2017-02-23 ENCOUNTER — Other Ambulatory Visit (HOSPITAL_COMMUNITY): Payer: Self-pay | Admitting: Internal Medicine

## 2017-02-23 DIAGNOSIS — R1319 Other dysphagia: Secondary | ICD-10-CM

## 2017-03-01 ENCOUNTER — Ambulatory Visit (HOSPITAL_COMMUNITY): Admission: RE | Admit: 2017-03-01 | Payer: PPO | Source: Ambulatory Visit

## 2017-05-28 DIAGNOSIS — Z1389 Encounter for screening for other disorder: Secondary | ICD-10-CM | POA: Diagnosis not present

## 2017-05-28 DIAGNOSIS — F3289 Other specified depressive episodes: Secondary | ICD-10-CM | POA: Diagnosis not present

## 2017-05-28 DIAGNOSIS — G4733 Obstructive sleep apnea (adult) (pediatric): Secondary | ICD-10-CM | POA: Diagnosis not present

## 2017-05-28 DIAGNOSIS — Z6841 Body Mass Index (BMI) 40.0 and over, adult: Secondary | ICD-10-CM | POA: Diagnosis not present

## 2017-05-28 DIAGNOSIS — M545 Low back pain: Secondary | ICD-10-CM | POA: Diagnosis not present

## 2017-05-28 DIAGNOSIS — F1120 Opioid dependence, uncomplicated: Secondary | ICD-10-CM | POA: Diagnosis not present

## 2017-05-28 DIAGNOSIS — R7301 Impaired fasting glucose: Secondary | ICD-10-CM | POA: Diagnosis not present

## 2017-05-28 DIAGNOSIS — I480 Paroxysmal atrial fibrillation: Secondary | ICD-10-CM | POA: Diagnosis not present

## 2017-05-28 DIAGNOSIS — K7290 Hepatic failure, unspecified without coma: Secondary | ICD-10-CM | POA: Diagnosis not present

## 2017-07-09 DIAGNOSIS — G4733 Obstructive sleep apnea (adult) (pediatric): Secondary | ICD-10-CM | POA: Diagnosis not present

## 2017-07-09 DIAGNOSIS — I480 Paroxysmal atrial fibrillation: Secondary | ICD-10-CM | POA: Diagnosis not present

## 2017-07-09 DIAGNOSIS — Z6841 Body Mass Index (BMI) 40.0 and over, adult: Secondary | ICD-10-CM | POA: Diagnosis not present

## 2017-09-06 DIAGNOSIS — I480 Paroxysmal atrial fibrillation: Secondary | ICD-10-CM | POA: Diagnosis not present

## 2017-09-06 DIAGNOSIS — M25551 Pain in right hip: Secondary | ICD-10-CM | POA: Diagnosis not present

## 2017-09-06 DIAGNOSIS — G4733 Obstructive sleep apnea (adult) (pediatric): Secondary | ICD-10-CM | POA: Diagnosis not present

## 2017-09-06 DIAGNOSIS — R2689 Other abnormalities of gait and mobility: Secondary | ICD-10-CM | POA: Diagnosis not present

## 2017-09-06 DIAGNOSIS — Z6841 Body Mass Index (BMI) 40.0 and over, adult: Secondary | ICD-10-CM | POA: Diagnosis not present

## 2017-10-01 DIAGNOSIS — I10 Essential (primary) hypertension: Secondary | ICD-10-CM | POA: Diagnosis not present

## 2017-10-01 DIAGNOSIS — K7290 Hepatic failure, unspecified without coma: Secondary | ICD-10-CM | POA: Diagnosis not present

## 2017-10-01 DIAGNOSIS — M109 Gout, unspecified: Secondary | ICD-10-CM | POA: Diagnosis not present

## 2017-10-01 DIAGNOSIS — G4733 Obstructive sleep apnea (adult) (pediatric): Secondary | ICD-10-CM | POA: Diagnosis not present

## 2017-10-01 DIAGNOSIS — R2689 Other abnormalities of gait and mobility: Secondary | ICD-10-CM | POA: Diagnosis not present

## 2017-10-01 DIAGNOSIS — M545 Low back pain: Secondary | ICD-10-CM | POA: Diagnosis not present

## 2017-10-01 DIAGNOSIS — R7301 Impaired fasting glucose: Secondary | ICD-10-CM | POA: Diagnosis not present

## 2017-10-01 DIAGNOSIS — Z6841 Body Mass Index (BMI) 40.0 and over, adult: Secondary | ICD-10-CM | POA: Diagnosis not present

## 2017-10-01 DIAGNOSIS — F3289 Other specified depressive episodes: Secondary | ICD-10-CM | POA: Diagnosis not present

## 2017-10-01 DIAGNOSIS — I480 Paroxysmal atrial fibrillation: Secondary | ICD-10-CM | POA: Diagnosis not present

## 2017-10-01 DIAGNOSIS — E7849 Other hyperlipidemia: Secondary | ICD-10-CM | POA: Diagnosis not present

## 2017-10-08 DIAGNOSIS — M25551 Pain in right hip: Secondary | ICD-10-CM | POA: Diagnosis not present

## 2017-10-08 DIAGNOSIS — R269 Unspecified abnormalities of gait and mobility: Secondary | ICD-10-CM | POA: Diagnosis not present

## 2017-10-12 ENCOUNTER — Encounter (INDEPENDENT_AMBULATORY_CARE_PROVIDER_SITE_OTHER): Payer: Self-pay | Admitting: Physical Medicine and Rehabilitation

## 2017-10-12 ENCOUNTER — Ambulatory Visit (INDEPENDENT_AMBULATORY_CARE_PROVIDER_SITE_OTHER): Payer: PPO | Admitting: Physical Medicine and Rehabilitation

## 2017-10-12 ENCOUNTER — Encounter

## 2017-10-12 VITALS — BP 148/84 | HR 59 | Temp 97.7°F

## 2017-10-12 DIAGNOSIS — M47816 Spondylosis without myelopathy or radiculopathy, lumbar region: Secondary | ICD-10-CM

## 2017-10-12 DIAGNOSIS — G894 Chronic pain syndrome: Secondary | ICD-10-CM

## 2017-10-12 DIAGNOSIS — M5116 Intervertebral disc disorders with radiculopathy, lumbar region: Secondary | ICD-10-CM | POA: Diagnosis not present

## 2017-10-12 DIAGNOSIS — F1190 Opioid use, unspecified, uncomplicated: Secondary | ICD-10-CM | POA: Diagnosis not present

## 2017-10-12 NOTE — Progress Notes (Signed)
[name] is a 77 year old gentleman who is followed by Dr. Janie Morning and whom I have seen on a few occasions approximately a year ago and completed radiofrequency ablation of the facet joints above and below an area where he had X-Stop procedures performed by Dr. Ellene Route.  He does suffer from chronic pain in general and does take MS Contin twice a day as well as oxycodone 3 times a day.  This is managed by Dr. Philip Aspen and has not really changed much since I first started seeing him.  He comes in today to report that approximately a week or so ago he really had abrupt onset of righthip and leg pain that he really refers to his whole leg but mainly more of an L5 distribution to the ankle.  He felt like he had some swelling in general.  He had no specific trauma.  He reported that this happened after he was swimming which she does try to swim for exercise.  He does not remember any other incidents or problems.  This pain was in fact severe enough that the current pain medications were not helpful.  He did evidently have some prednisone in the house from a prior problem and did take this for 3 days.  This seemed to help each day until the point where now he is really not having much in the way of pain at all.  He does rate his average overall pain is still a 7 out of 10.  He reports that he talk to Dr. Philip Aspen and does have a prescription for prednisone to take with him to the beach.  They are heading to the beach tomorrow.  On exam today he has good distal strength with only mild swelling of the ankles bilaterally with good distal pulses.  He has no clonus bilaterally.  He has good overall strength but does ambulate with a cane and has a great deal of difficulty arising from a seated position.  This is something that is fairly consistent with him on exam.  Without any red flag complaints and basically resolution of his symptoms I am we have much to offer at this point other than I think he has a good plan at this  point to get him through this time at the beach.  If this were to flareup greatly then obviously we could schedule him for may be a possible epidural injection.  It might be time at some point to reimage him.  .Numeric Pain Rating Scale and Functional Assessment Average Pain 7 Pain Right Now 1 My pain is intermittent, sharp and aching Pain is worse with: walking, bending and some activites Pain improves with: medication   In the last MONTH (on 0-10 scale) has pain interfered with the following?  1. General activity like being  able to carry out your everyday physical activities such as walking, climbing stairs, carrying groceries, or moving a chair?  Rating(5)  2. Relation with others like being able to carry out your usual social activities and roles such as  activities at home, at work and in your community. Rating(0)  3. Enjoyment of life such that you have  been bothered by emotional problems such as feeling anxious, depressed or irritable?  Rating(0)

## 2018-01-05 ENCOUNTER — Other Ambulatory Visit: Payer: Self-pay | Admitting: Orthopaedic Surgery

## 2018-03-03 DIAGNOSIS — R7301 Impaired fasting glucose: Secondary | ICD-10-CM | POA: Diagnosis not present

## 2018-03-03 DIAGNOSIS — I10 Essential (primary) hypertension: Secondary | ICD-10-CM | POA: Diagnosis not present

## 2018-03-03 DIAGNOSIS — Z125 Encounter for screening for malignant neoplasm of prostate: Secondary | ICD-10-CM | POA: Diagnosis not present

## 2018-03-03 DIAGNOSIS — M109 Gout, unspecified: Secondary | ICD-10-CM | POA: Diagnosis not present

## 2018-03-03 DIAGNOSIS — R82998 Other abnormal findings in urine: Secondary | ICD-10-CM | POA: Diagnosis not present

## 2018-03-03 DIAGNOSIS — G4733 Obstructive sleep apnea (adult) (pediatric): Secondary | ICD-10-CM | POA: Diagnosis not present

## 2018-03-03 DIAGNOSIS — E7849 Other hyperlipidemia: Secondary | ICD-10-CM | POA: Diagnosis not present

## 2018-03-04 DIAGNOSIS — M79603 Pain in arm, unspecified: Secondary | ICD-10-CM | POA: Diagnosis not present

## 2018-03-04 DIAGNOSIS — S46001A Unspecified injury of muscle(s) and tendon(s) of the rotator cuff of right shoulder, initial encounter: Secondary | ICD-10-CM | POA: Diagnosis not present

## 2018-03-04 DIAGNOSIS — M19011 Primary osteoarthritis, right shoulder: Secondary | ICD-10-CM | POA: Diagnosis not present

## 2018-03-04 DIAGNOSIS — S4991XA Unspecified injury of right shoulder and upper arm, initial encounter: Secondary | ICD-10-CM | POA: Diagnosis not present

## 2018-03-04 DIAGNOSIS — W010XXA Fall on same level from slipping, tripping and stumbling without subsequent striking against object, initial encounter: Secondary | ICD-10-CM | POA: Diagnosis not present

## 2018-03-04 DIAGNOSIS — Z886 Allergy status to analgesic agent status: Secondary | ICD-10-CM | POA: Diagnosis not present

## 2018-03-04 DIAGNOSIS — I10 Essential (primary) hypertension: Secondary | ICD-10-CM | POA: Diagnosis not present

## 2018-03-04 DIAGNOSIS — Z79899 Other long term (current) drug therapy: Secondary | ICD-10-CM | POA: Diagnosis not present

## 2018-03-04 DIAGNOSIS — S50311A Abrasion of right elbow, initial encounter: Secondary | ICD-10-CM | POA: Diagnosis not present

## 2018-03-08 ENCOUNTER — Ambulatory Visit: Payer: PPO | Admitting: Neurology

## 2018-03-09 ENCOUNTER — Emergency Department (HOSPITAL_COMMUNITY): Payer: PPO

## 2018-03-09 ENCOUNTER — Inpatient Hospital Stay (HOSPITAL_COMMUNITY)
Admission: EM | Admit: 2018-03-09 | Discharge: 2018-03-13 | DRG: 092 | Disposition: A | Discharge status: Skilled Nursing Facility | Payer: PPO | Attending: Family Medicine | Admitting: Family Medicine

## 2018-03-09 ENCOUNTER — Encounter (HOSPITAL_COMMUNITY): Payer: Self-pay | Admitting: Emergency Medicine

## 2018-03-09 ENCOUNTER — Other Ambulatory Visit: Payer: Self-pay

## 2018-03-09 ENCOUNTER — Observation Stay (HOSPITAL_COMMUNITY): Payer: PPO

## 2018-03-09 DIAGNOSIS — S0990XA Unspecified injury of head, initial encounter: Secondary | ICD-10-CM | POA: Diagnosis not present

## 2018-03-09 DIAGNOSIS — Z7901 Long term (current) use of anticoagulants: Secondary | ICD-10-CM | POA: Diagnosis not present

## 2018-03-09 DIAGNOSIS — S42211D Unspecified displaced fracture of surgical neck of right humerus, subsequent encounter for fracture with routine healing: Secondary | ICD-10-CM | POA: Diagnosis not present

## 2018-03-09 DIAGNOSIS — Z8261 Family history of arthritis: Secondary | ICD-10-CM | POA: Diagnosis not present

## 2018-03-09 DIAGNOSIS — F0280 Dementia in other diseases classified elsewhere without behavioral disturbance: Secondary | ICD-10-CM | POA: Diagnosis not present

## 2018-03-09 DIAGNOSIS — E7800 Pure hypercholesterolemia, unspecified: Secondary | ICD-10-CM | POA: Diagnosis not present

## 2018-03-09 DIAGNOSIS — E785 Hyperlipidemia, unspecified: Secondary | ICD-10-CM | POA: Diagnosis not present

## 2018-03-09 DIAGNOSIS — R0902 Hypoxemia: Secondary | ICD-10-CM | POA: Diagnosis not present

## 2018-03-09 DIAGNOSIS — Z6841 Body Mass Index (BMI) 40.0 and over, adult: Secondary | ICD-10-CM

## 2018-03-09 DIAGNOSIS — M6281 Muscle weakness (generalized): Secondary | ICD-10-CM | POA: Diagnosis not present

## 2018-03-09 DIAGNOSIS — Z87440 Personal history of urinary (tract) infections: Secondary | ICD-10-CM

## 2018-03-09 DIAGNOSIS — I6529 Occlusion and stenosis of unspecified carotid artery: Secondary | ICD-10-CM | POA: Diagnosis present

## 2018-03-09 DIAGNOSIS — R52 Pain, unspecified: Secondary | ICD-10-CM | POA: Diagnosis not present

## 2018-03-09 DIAGNOSIS — S42211A Unspecified displaced fracture of surgical neck of right humerus, initial encounter for closed fracture: Secondary | ICD-10-CM | POA: Diagnosis not present

## 2018-03-09 DIAGNOSIS — Z79891 Long term (current) use of opiate analgesic: Secondary | ICD-10-CM

## 2018-03-09 DIAGNOSIS — Z96612 Presence of left artificial shoulder joint: Secondary | ICD-10-CM | POA: Diagnosis present

## 2018-03-09 DIAGNOSIS — I10 Essential (primary) hypertension: Secondary | ICD-10-CM | POA: Diagnosis present

## 2018-03-09 DIAGNOSIS — Z9989 Dependence on other enabling machines and devices: Secondary | ICD-10-CM

## 2018-03-09 DIAGNOSIS — M2550 Pain in unspecified joint: Secondary | ICD-10-CM | POA: Diagnosis not present

## 2018-03-09 DIAGNOSIS — F05 Delirium due to known physiological condition: Secondary | ICD-10-CM | POA: Diagnosis present

## 2018-03-09 DIAGNOSIS — E662 Morbid (severe) obesity with alveolar hypoventilation: Secondary | ICD-10-CM | POA: Diagnosis not present

## 2018-03-09 DIAGNOSIS — Z833 Family history of diabetes mellitus: Secondary | ICD-10-CM

## 2018-03-09 DIAGNOSIS — I2510 Atherosclerotic heart disease of native coronary artery without angina pectoris: Secondary | ICD-10-CM | POA: Diagnosis present

## 2018-03-09 DIAGNOSIS — M549 Dorsalgia, unspecified: Secondary | ICD-10-CM | POA: Diagnosis not present

## 2018-03-09 DIAGNOSIS — W19XXXA Unspecified fall, initial encounter: Secondary | ICD-10-CM | POA: Diagnosis present

## 2018-03-09 DIAGNOSIS — R410 Disorientation, unspecified: Secondary | ICD-10-CM

## 2018-03-09 DIAGNOSIS — Z5320 Procedure and treatment not carried out because of patient's decision for unspecified reasons: Secondary | ICD-10-CM | POA: Diagnosis not present

## 2018-03-09 DIAGNOSIS — Z7401 Bed confinement status: Secondary | ICD-10-CM | POA: Diagnosis not present

## 2018-03-09 DIAGNOSIS — Z961 Presence of intraocular lens: Secondary | ICD-10-CM | POA: Diagnosis present

## 2018-03-09 DIAGNOSIS — G9340 Encephalopathy, unspecified: Secondary | ICD-10-CM | POA: Diagnosis not present

## 2018-03-09 DIAGNOSIS — R4182 Altered mental status, unspecified: Secondary | ICD-10-CM | POA: Diagnosis not present

## 2018-03-09 DIAGNOSIS — R2681 Unsteadiness on feet: Secondary | ICD-10-CM | POA: Diagnosis not present

## 2018-03-09 DIAGNOSIS — G894 Chronic pain syndrome: Secondary | ICD-10-CM | POA: Diagnosis not present

## 2018-03-09 DIAGNOSIS — R278 Other lack of coordination: Secondary | ICD-10-CM | POA: Diagnosis not present

## 2018-03-09 DIAGNOSIS — Z7902 Long term (current) use of antithrombotics/antiplatelets: Secondary | ICD-10-CM | POA: Diagnosis not present

## 2018-03-09 DIAGNOSIS — G2581 Restless legs syndrome: Secondary | ICD-10-CM | POA: Diagnosis present

## 2018-03-09 DIAGNOSIS — R404 Transient alteration of awareness: Secondary | ICD-10-CM | POA: Diagnosis not present

## 2018-03-09 DIAGNOSIS — I480 Paroxysmal atrial fibrillation: Secondary | ICD-10-CM | POA: Diagnosis present

## 2018-03-09 DIAGNOSIS — Z8249 Family history of ischemic heart disease and other diseases of the circulatory system: Secondary | ICD-10-CM

## 2018-03-09 DIAGNOSIS — Z9181 History of falling: Secondary | ICD-10-CM | POA: Diagnosis not present

## 2018-03-09 DIAGNOSIS — G319 Degenerative disease of nervous system, unspecified: Secondary | ICD-10-CM | POA: Diagnosis present

## 2018-03-09 DIAGNOSIS — G92 Toxic encephalopathy: Principal | ICD-10-CM | POA: Diagnosis present

## 2018-03-09 DIAGNOSIS — Z87891 Personal history of nicotine dependence: Secondary | ICD-10-CM

## 2018-03-09 DIAGNOSIS — D649 Anemia, unspecified: Secondary | ICD-10-CM | POA: Diagnosis present

## 2018-03-09 DIAGNOSIS — R41841 Cognitive communication deficit: Secondary | ICD-10-CM | POA: Diagnosis not present

## 2018-03-09 DIAGNOSIS — S42201A Unspecified fracture of upper end of right humerus, initial encounter for closed fracture: Secondary | ICD-10-CM | POA: Diagnosis not present

## 2018-03-09 LAB — CBC WITH DIFFERENTIAL/PLATELET
Abs Immature Granulocytes: 0.05 10*3/uL (ref 0.00–0.07)
BASOS ABS: 0 10*3/uL (ref 0.0–0.1)
BASOS PCT: 0 %
EOS ABS: 0.5 10*3/uL (ref 0.0–0.5)
Eosinophils Relative: 5 %
HCT: 34.9 % — ABNORMAL LOW (ref 39.0–52.0)
Hemoglobin: 11.3 g/dL — ABNORMAL LOW (ref 13.0–17.0)
IMMATURE GRANULOCYTES: 1 %
LYMPHS ABS: 1.3 10*3/uL (ref 0.7–4.0)
Lymphocytes Relative: 13 %
MCH: 30.2 pg (ref 26.0–34.0)
MCHC: 32.4 g/dL (ref 30.0–36.0)
MCV: 93.3 fL (ref 80.0–100.0)
Monocytes Absolute: 1.2 10*3/uL — ABNORMAL HIGH (ref 0.1–1.0)
Monocytes Relative: 12 %
NEUTROS PCT: 69 %
NRBC: 0 % (ref 0.0–0.2)
Neutro Abs: 7.5 10*3/uL (ref 1.7–7.7)
Platelets: 369 10*3/uL (ref 150–400)
RBC: 3.74 MIL/uL — AB (ref 4.22–5.81)
RDW: 13.1 % (ref 11.5–15.5)
WBC: 10.7 10*3/uL — AB (ref 4.0–10.5)

## 2018-03-09 LAB — URINALYSIS, ROUTINE W REFLEX MICROSCOPIC
BILIRUBIN URINE: NEGATIVE
Glucose, UA: NEGATIVE mg/dL
Hgb urine dipstick: NEGATIVE
KETONES UR: NEGATIVE mg/dL
Leukocytes, UA: NEGATIVE
NITRITE: NEGATIVE
PROTEIN: NEGATIVE mg/dL
SPECIFIC GRAVITY, URINE: 1.019 (ref 1.005–1.030)
pH: 5 (ref 5.0–8.0)

## 2018-03-09 LAB — COMPREHENSIVE METABOLIC PANEL
ALBUMIN: 3.1 g/dL — AB (ref 3.5–5.0)
ALT: 22 U/L (ref 0–44)
ANION GAP: 11 (ref 5–15)
AST: 20 U/L (ref 15–41)
Alkaline Phosphatase: 53 U/L (ref 38–126)
BILIRUBIN TOTAL: 1 mg/dL (ref 0.3–1.2)
BUN: 30 mg/dL — ABNORMAL HIGH (ref 8–23)
CO2: 21 mmol/L — ABNORMAL LOW (ref 22–32)
Calcium: 8.8 mg/dL — ABNORMAL LOW (ref 8.9–10.3)
Chloride: 100 mmol/L (ref 98–111)
Creatinine, Ser: 1.32 mg/dL — ABNORMAL HIGH (ref 0.61–1.24)
GFR calc Af Amer: 58 mL/min — ABNORMAL LOW (ref >60)
GFR calc non Af Amer: 50 mL/min — ABNORMAL LOW (ref >60)
GLUCOSE: 110 mg/dL — AB (ref 70–99)
POTASSIUM: 4.1 mmol/L (ref 3.5–5.1)
Sodium: 132 mmol/L — ABNORMAL LOW (ref 135–145)
TOTAL PROTEIN: 6.2 g/dL — AB (ref 6.5–8.1)

## 2018-03-09 LAB — ETHANOL

## 2018-03-09 LAB — TROPONIN I

## 2018-03-09 LAB — AMMONIA: Ammonia: 15 umol/L (ref 9–35)

## 2018-03-09 MED ORDER — AMIODARONE HCL 100 MG PO TABS
100.0000 mg | ORAL_TABLET | Freq: Every day | ORAL | Status: DC
  Administered 2018-03-10 – 2018-03-13 (×4): 100 mg via ORAL
  Filled 2018-03-09 (×4): qty 1

## 2018-03-09 MED ORDER — ZOLPIDEM TARTRATE 5 MG PO TABS
5.0000 mg | ORAL_TABLET | Freq: Every day | ORAL | Status: DC
  Administered 2018-03-09: 5 mg via ORAL
  Filled 2018-03-09: qty 1

## 2018-03-09 MED ORDER — DILTIAZEM HCL ER COATED BEADS 120 MG PO CP24
120.0000 mg | ORAL_CAPSULE | Freq: Every day | ORAL | Status: DC
  Administered 2018-03-10 – 2018-03-13 (×4): 120 mg via ORAL
  Filled 2018-03-09 (×5): qty 1

## 2018-03-09 MED ORDER — ONDANSETRON HCL 4 MG/2ML IJ SOLN: 4.0000 mg | Freq: Four times a day (QID) | INTRAMUSCULAR | Status: DC | PRN

## 2018-03-09 MED ORDER — PANTOPRAZOLE SODIUM 40 MG PO TBEC
40.0000 mg | DELAYED_RELEASE_TABLET | Freq: Two times a day (BID) | ORAL | Status: DC
  Administered 2018-03-09 – 2018-03-13 (×7): 40 mg via ORAL
  Filled 2018-03-09 (×7): qty 1

## 2018-03-09 MED ORDER — OXYCODONE-ACETAMINOPHEN 5-325 MG PO TABS
1.0000 | ORAL_TABLET | Freq: Once | ORAL | Status: AC
  Administered 2018-03-09: 1 via ORAL
  Filled 2018-03-09: qty 1

## 2018-03-09 MED ORDER — HALOPERIDOL LACTATE 5 MG/ML IJ SOLN: 2.0000 mg | Freq: Four times a day (QID) | INTRAMUSCULAR | Status: DC | PRN

## 2018-03-09 MED ORDER — ROSUVASTATIN CALCIUM 10 MG PO TABS
20.0000 mg | ORAL_TABLET | Freq: Every day | ORAL | Status: DC
  Administered 2018-03-10 – 2018-03-13 (×4): 20 mg via ORAL
  Filled 2018-03-09 (×4): qty 2

## 2018-03-09 MED ORDER — SODIUM CHLORIDE 0.9% FLUSH
3.0000 mL | Freq: Two times a day (BID) | INTRAVENOUS | Status: DC
  Administered 2018-03-09 – 2018-03-13 (×8): 3 mL via INTRAVENOUS

## 2018-03-09 MED ORDER — GABAPENTIN 300 MG PO CAPS
300.0000 mg | ORAL_CAPSULE | Freq: Two times a day (BID) | ORAL | Status: DC
  Administered 2018-03-09 – 2018-03-11 (×4): 300 mg via ORAL
  Filled 2018-03-09 (×4): qty 1

## 2018-03-09 MED ORDER — ATORVASTATIN CALCIUM 20 MG PO TABS: 20.0000 mg | ORAL_TABLET | Freq: Every day | ORAL | Status: DC

## 2018-03-09 MED ORDER — LORAZEPAM 2 MG/ML IJ SOLN
0.5000 mg | INTRAMUSCULAR | Status: DC | PRN
  Filled 2018-03-09: qty 1

## 2018-03-09 MED ORDER — QUETIAPINE FUMARATE 25 MG PO TABS
25.0000 mg | ORAL_TABLET | Freq: Every day | ORAL | Status: DC
  Administered 2018-03-09: 25 mg via ORAL
  Filled 2018-03-09: qty 1

## 2018-03-09 MED ORDER — TRIAMTERENE-HCTZ 37.5-25 MG PO TABS
0.5000 | ORAL_TABLET | ORAL | Status: DC
  Administered 2018-03-11: 0.5 via ORAL
  Filled 2018-03-09: qty 0.5

## 2018-03-09 MED ORDER — ONDANSETRON HCL 4 MG PO TABS: 4.0000 mg | ORAL_TABLET | Freq: Four times a day (QID) | ORAL | Status: DC | PRN

## 2018-03-09 MED ORDER — DESMOPRESSIN ACETATE 0.2 MG PO TABS
0.2000 mg | ORAL_TABLET | Freq: Every day | ORAL | Status: DC
  Administered 2018-03-09 – 2018-03-12 (×4): 0.2 mg via ORAL
  Filled 2018-03-09 (×4): qty 1

## 2018-03-09 MED ORDER — OXYCODONE-ACETAMINOPHEN 10-325 MG PO TABS: 1.0000 | ORAL_TABLET | Freq: Three times a day (TID) | ORAL | Status: DC | PRN

## 2018-03-09 MED ORDER — SODIUM CHLORIDE 0.9 % IV BOLUS
500.0000 mL | Freq: Once | INTRAVENOUS | Status: AC
  Administered 2018-03-09: 500 mL via INTRAVENOUS

## 2018-03-09 MED ORDER — LACTULOSE 10 GM/15ML PO SOLN
20.0000 g | Freq: Two times a day (BID) | ORAL | Status: DC
  Administered 2018-03-10 – 2018-03-12 (×3): 20 g via ORAL
  Filled 2018-03-09 (×6): qty 30

## 2018-03-09 MED ORDER — ACETAMINOPHEN 650 MG RE SUPP: 650.0000 mg | Freq: Four times a day (QID) | RECTAL | Status: DC | PRN

## 2018-03-09 MED ORDER — DULOXETINE HCL 60 MG PO CPEP
90.0000 mg | ORAL_CAPSULE | Freq: Every day | ORAL | Status: DC
  Administered 2018-03-10 – 2018-03-13 (×4): 90 mg via ORAL
  Filled 2018-03-09 (×4): qty 1

## 2018-03-09 MED ORDER — OXYCODONE-ACETAMINOPHEN 5-325 MG PO TABS: 1.0000 | ORAL_TABLET | Freq: Three times a day (TID) | ORAL | Status: DC | PRN

## 2018-03-09 MED ORDER — ACETAMINOPHEN 325 MG PO TABS: 650.0000 mg | ORAL_TABLET | Freq: Four times a day (QID) | ORAL | Status: DC | PRN

## 2018-03-09 MED ORDER — RIVAROXABAN 15 MG PO TABS
15.0000 mg | ORAL_TABLET | Freq: Every day | ORAL | Status: DC
  Administered 2018-03-09: 15 mg via ORAL
  Filled 2018-03-09 (×2): qty 1

## 2018-03-09 MED ORDER — MORPHINE SULFATE 15 MG PO TABS
30.0000 mg | ORAL_TABLET | Freq: Two times a day (BID) | ORAL | Status: DC
  Administered 2018-03-09 – 2018-03-10 (×2): 30 mg via ORAL
  Filled 2018-03-09 (×2): qty 2

## 2018-03-09 MED ORDER — OXYCODONE HCL 5 MG PO TABS
5.0000 mg | ORAL_TABLET | Freq: Three times a day (TID) | ORAL | Status: DC | PRN
  Administered 2018-03-11 – 2018-03-13 (×4): 5 mg via ORAL
  Filled 2018-03-09 (×4): qty 1

## 2018-03-09 NOTE — ED Notes (Signed)
Patient transported to CT 

## 2018-03-09 NOTE — ED Notes (Signed)
Pt aware of need for urine, pt unable to give same at this time.

## 2018-03-09 NOTE — Progress Notes (Signed)
Patient is confused at this time. Patient refusing CPAP at this time. No distress noted.

## 2018-03-09 NOTE — Progress Notes (Signed)
Received Pt on a stretcher, alert, oriented x 1, confused and trying to get out of bed. Pt combative, tried to hit staff, refused vital signs to be taken. No family at bedside.

## 2018-03-09 NOTE — ED Notes (Signed)
Patient transported to X-ray 

## 2018-03-09 NOTE — ED Triage Notes (Signed)
Pt here via [facility] from home, had a fall last Friday and was seen at a hospital in [place] cuff injury right arm. Reports decreased mobility and weakness since.  Reports darker urine than normal.  Pt has some confusion at baseline.

## 2018-03-09 NOTE — Plan of Care (Signed)

## 2018-03-09 NOTE — ED Provider Notes (Signed)
Emergency Department Provider Note   I have reviewed the triage vital signs and the nursing notes.   HISTORY  Chief Complaint Weakness/shoulder injury   HPI Carlos Hanson is a 77 y.o. male with PMH of CAD, HLD, HTN, OSA, and chronic lower back pain presents to the emergency department from home by EMS.  The patient had a fall 5 days prior and was seen at the [facility] ED. Patient was diagnosed with a rotator cuff injury and placed in a sling.  He was prescribed Percocet for pain which he states he has been taking.  Patient complains of being generally weak and having decreased mobility.  EMS, who spoke with the wife at home, states that the patient is at his baseline mental status as far as they know. Patient denies any CP, SOB, abdominal pain, or headache. He did sustain a head injury when falling 5 days prior. Denies any UTI symptoms. No vomiting. Patient is on Pradaxa.   Past Medical History:  Diagnosis Date  . Arthritis   . Chronic back pain   . Coronary artery disease    40% mLAD/DIAG bifurcation 03/25/10   . Dysrhythmia    History of atrial fibrillation  . Gout   . H/O hiatal hernia   . Hearing decreased    right ear  . History of bladder infections   . History of bleeding ulcers   . History of ulcer disease   . Hypercholesteremia   . Hypertension   . Nocturia   . Obstructive sleep apnea hypopnea, severe   . Sleep apnea    cpap sleep study 2011    Patient Active Problem List   Diagnosis Date Noted  . Morbid obesity due to excess calories ([place]) 05/16/2015  . Chronic pain syndrome 05/16/2015  . OSA on CPAP 05/16/2015  . RLS (restless legs syndrome) 07/09/2014  . Paroxysmal atrial fibrillation ([place]) 07/09/2014  . Obesity hypoventilation syndrome ([place]) 07/09/2014  . OSA treated with BiPAP 07/09/2014  . Altered mental state 06/29/2014  . UTI (lower urinary tract infection) 06/29/2014    Class: Acute  . Back pain 06/29/2014    Class: Chronic  . Obstructive  sleep apnea hypopnea, severe 06/29/2014    Class: Chronic  . Altered mental status 06/29/2014    Past Surgical History:  Procedure Laterality Date  . BACK SURGERY    . CARDIAC CATHETERIZATION     2011  . cardioverson    . COLONOSCOPY  08/27/2011   Procedure: COLONOSCOPY;  Surgeon: Rogene Houston, MD;  [facility];  Service: Endoscopy;  Laterality: N/A;  930  . ESOPHAGOGASTRODUODENOSCOPY    . JOINT REPLACEMENT     left shoulder  . Left shoulder replacement    . LUMBAR LAMINECTOMY  03/15/2012   Procedure: LUMBAR LAMINECTOMY WITH  X-STOP 2 LEVEL;  Surgeon: Kristeen Miss, MD;  Location: [place] NEURO ORS;  Service: Neurosurgery;  Laterality: N/A;  Lumbar two-three, three-four XSTOP  . TONSILLECTOMY      Allergies Naprosyn [naproxen]  Family History  Problem Relation Age of Onset  . Heart failure Mother   . Diabetes Mother   . Arthritis Mother   . High blood pressure Mother   . Heart failure Father   . Colon cancer Neg Hx     Social History Social History   Tobacco Use  . Smoking status: Former Smoker    Packs/day: 1.00    Years: 3.00    Pack years: 3.00    Types: Cigarettes  .  Smokeless tobacco: Never Used  . Tobacco comment: Quit 8 years ago.  Substance Use Topics  . Alcohol use: Yes    Alcohol/week: 14.0 standard drinks    Types: 14 Standard drinks or equivalent per week    Comment: one drink a day  . Drug use: No    Review of Systems  Constitutional: No fever/chills. Positive generalized weakness.  Eyes: No visual changes. ENT: No sore throat. Cardiovascular: Denies chest pain. Respiratory: Denies shortness of breath. Gastrointestinal: No abdominal pain.  No nausea, no vomiting.  No diarrhea.  No constipation. Genitourinary: Negative for dysuria. Musculoskeletal: Negative for back pain. Positive left shoulder pain.  Skin: Negative for rash. Neurological: Negative for headaches, focal weakness or numbness.  10-point ROS otherwise  negative.  ____________________________________________   PHYSICAL EXAM:  VITAL SIGNS: ED Triage Vitals  Enc Vitals Group     BP 03/09/18 1121 133/61     Pulse Rate 03/09/18 1121 88     Resp 03/09/18 1121 16     Temp 03/09/18 1121 98 F (36.7 C)     Temp Source 03/09/18 1121 Oral     SpO2 03/09/18 1116 94 %     Weight 03/09/18 1119 300 lb (136.1 kg)     Height 03/09/18 1119 6' (1.829 m)     Pain Score 03/09/18 1119 0   Constitutional: Alert with some confusion when re-telling his story regarding the fall and what brings him here today. Well appearing and in no acute distress. Eyes: Conjunctivae are normal. PERRL. Head: Atraumatic. Nose: No congestion/rhinnorhea. Mouth/Throat: Mucous membranes are moist.  Neck: No stridor. No cervical spine tenderness to palpation. Cardiovascular: Normal rate, regular rhythm. Good peripheral circulation. Grossly normal heart sounds.   Respiratory: Normal respiratory effort.  No retractions. Lungs CTAB. Gastrointestinal: Soft and nontender. No distention.  Musculoskeletal: Right arm in sling with pain on ROM. Normal ROM of bilateral hips, knees, and ankles. Normal ROM of the left shoulder.  Neurologic:  Normal speech and language. No gross focal neurologic deficits are appreciated.  Skin:  Skin is warm, dry and intact. No rash noted.  ____________________________________________   LABS (all labs ordered are listed, but only abnormal results are displayed)  Labs Reviewed  COMPREHENSIVE METABOLIC PANEL - Abnormal; Notable for the following components:      Result Value   Sodium 132 (*)    CO2 21 (*)    Glucose, Bld 110 (*)    BUN 30 (*)    Creatinine, Ser 1.32 (*)    Calcium 8.8 (*)    Total Protein 6.2 (*)    Albumin 3.1 (*)    GFR calc non Af Amer 50 (*)    GFR calc Af Amer 58 (*)    All other components within normal limits  CBC WITH DIFFERENTIAL/PLATELET - Abnormal; Notable for the following components:   WBC 10.7 (*)    RBC  3.74 (*)    Hemoglobin 11.3 (*)    HCT 34.9 (*)    Monocytes Absolute 1.2 (*)    All other components within normal limits  URINE CULTURE  ETHANOL  TROPONIN I  AMMONIA  URINALYSIS, ROUTINE W REFLEX MICROSCOPIC   ____________________________________________  EKG   EKG Interpretation  Date/Time:  Wednesday March 09 2018 11:23:22 EST Ventricular Rate:  81 PR Interval:    QRS Duration: 134 QT Interval:  393 QTC Calculation: 457 R Axis:   -77 Text Interpretation:  Sinus rhythm Ventricular bigeminy RBBB and LAFB No STEMI. Similar to prior.  Confirmed by Nanda Quinton [phone]) on 03/09/2018 11:38:20 AM       ____________________________________________  RADIOLOGY  Dg Chest 2 View  Result Date: 03/09/2018 CLINICAL DATA:  Altered mental status EXAM: CHEST - 2 VIEW COMPARISON:  05/01/2016 FINDINGS: The heart size and mediastinal contours are within normal limits. Both lungs are clear. The visualized skeletal structures are unremarkable. Left shoulder replacement. IMPRESSION: No active cardiopulmonary disease. Electronically Signed   By: Franchot Gallo M.D.   On: 03/09/2018 12:32   Dg Shoulder Right  Result Date: 03/09/2018 CLINICAL DATA:  Fall EXAM: RIGHT SHOULDER - 2+ VIEW COMPARISON:  03/04/2018 FINDINGS: Impacted fracture right humeral neck with mild angulation. Mild widening of the shoulder joint may represent effusion. No fracture of the clavicle or scapula. IMPRESSION: Impacted fracture right humeral neck Electronically Signed   By: Franchot Gallo M.D.   On: 03/09/2018 12:34   Ct Head Wo Contrast  Result Date: 03/09/2018 CLINICAL DATA:  Fall 5 days ago. Decreased mobility and subsequent weakness. History of hypertension, hypercholesterolemia. EXAM: CT HEAD WITHOUT CONTRAST TECHNIQUE: Contiguous axial images were obtained from the base of the skull through the vertex without intravenous contrast. COMPARISON:  CT HEAD November 14, 2016 FINDINGS: BRAIN: No intraparenchymal hemorrhage,  mass effect nor midline shift. Moderate to severe supratentorial parenchymal brain volume loss with somewhat disproportionate mid brain atrophy. No hydrocephalus. Patchy supratentorial white matter hypodensities. No acute large vascular territory infarcts. No abnormal extra-axial fluid collections. Basal cisterns are patent. VASCULAR: Moderate calcific atherosclerosis of the carotid siphons. SKULL: No skull fracture. No significant scalp soft tissue swelling. SINUSES/ORBITS: Moderate paranasal sinus mucosal thickening. Mastoid air cells are well aerated.The included ocular globes and orbital contents are non-suspicious. Status post bilateral ocular lens implants. OTHER: None. IMPRESSION: 1. No acute intracranial process. 2. Stable moderate to severe parenchymal brain volume loss and moderate chronic small vessel ischemic changes. Electronically Signed   By: Elon Alas M.D.   On: 03/09/2018 13:30    ____________________________________________   PROCEDURES  Procedure(s) performed:   Procedures  None ____________________________________________   INITIAL IMPRESSION / ASSESSMENT AND PLAN / ED COURSE  Pertinent labs & imaging results that were available during my care of the patient were reviewed by me and considered in my medical decision making (see chart for details).  Patient presents to the emergency department for reevaluation after fall 5 days ago.  He is on Pradaxa and reports sustaining some mild head trauma during his fall.  The fall appears to have been mechanical rather than syncope related.  He does seem somewhat confused on my evaluation.  I will attempt to get in touch with his wife for further information regarding his baseline mental status. Given some mild confusion and unknown baseline at this time I plan for CT head, CXR, repeat plain film of the right shoulder and labs including UA.   04:10 PM No clear reason for patient's confusion/delirium identified. UA negative. CT  imaging with no acute findings. Patient actually with impacted humeral neck fx. Patient already in sling. Will need to f/u with ortho as an outpatient. No indication for emergent consultation. Pain medication given. Plan for admit for delirium. MRI ordered to evaluated for CVA. Does have left artifical shoulder but has had an MRI with this in place. Dr. Erlinda Hong is the on-call ortho provider. Patient can call the office after discharge.   Discussed patient's case with Hosptialist to request admission. Patient and family (if present) updated with plan. Care transferred to [facility] service.  I reviewed all nursing notes, vitals, pertinent old records, EKGs, labs, imaging (as available).  ____________________________________________  FINAL CLINICAL IMPRESSION(S) / ED DIAGNOSES  Final diagnoses:  Delirium  Fx humeral neck, right, closed, initial encounter    MEDICATIONS GIVEN DURING THIS VISIT:  Medications  sodium chloride 0.9 % bolus 500 mL (0 mLs Intravenous Stopped 03/09/18 1303)  oxyCODONE-acetaminophen (PERCOCET/ROXICET) 5-325 MG per tablet 1 tablet (1 tablet Oral Given 03/09/18 1613)    Note:  This document was prepared using Dragon voice recognition software and may include unintentional dictation errors.  Nanda Quinton, MD Emergency Medicine    Yeray Tomas, [facility], MD 03/09/18 [id number]

## 2018-03-09 NOTE — H&P (Signed)
[facility] History and Physical   Patient: Carlos Hanson XNA:[id number]   PCP: Leanna Battles, MD DOB: 1941-02-21   DOA: 03/09/2018   DOS: 03/09/2018   DOS: the patient was seen and examined on 03/09/2018  Patient coming from: The patient is coming from home.  Chief Complaint: Confusion  HPI: Carlos Hanson is a 77 y.o. male with Past medical history of OSA, CAD, HLD, HTN, chronic back pain, recent fall with humerus fracture. Patient presents with complaints of confusion ongoing for last few days.  Had a fall 5 days ago and [facility] at which time he was told that he had a rotator cuff injury and patient was placed in a sling and was recommended oral narcotics. Patient has been taking increased dose of Percocets and has become more confused over last few days.  Become agitated and therefore was brought to the hospital. No fall no trauma no injury after the earlier fall.  No nausea no vomiting no diarrhea reported.  History is limited as the patient is confused and no family member available around  ED Course: Initial work-up was unremarkable but patient remained confused and agitated and therefore was referred for admission.  At his baseline ambulates without any support And is independent for most of his ADL; manages his medication on his own.  Review of Systems: as mentioned in the history of present illness.  All other systems reviewed and are negative.  Past Medical History:  Diagnosis Date  . Arthritis   . Chronic back pain   . Coronary artery disease    40% mLAD/DIAG bifurcation 03/25/10   . Dysrhythmia    History of atrial fibrillation  . Gout   . H/O hiatal hernia   . Hearing decreased    right ear  . History of bladder infections   . History of bleeding ulcers   . History of ulcer disease   . Hypercholesteremia   . Hypertension   . Nocturia   . Obstructive sleep apnea hypopnea, severe   . Sleep apnea    cpap sleep study 2011   Past  Surgical History:  Procedure Laterality Date  . BACK SURGERY    . CARDIAC CATHETERIZATION     2011  . cardioverson    . COLONOSCOPY  08/27/2011   Procedure: COLONOSCOPY;  Surgeon: Rogene Houston, MD;  [facility];  Service: Endoscopy;  Laterality: N/A;  930  . ESOPHAGOGASTRODUODENOSCOPY    . JOINT REPLACEMENT     left shoulder  . Left shoulder replacement    . LUMBAR LAMINECTOMY  03/15/2012   Procedure: LUMBAR LAMINECTOMY WITH  X-STOP 2 LEVEL;  Surgeon: Kristeen Miss, MD;  Location: [place] NEURO ORS;  Service: Neurosurgery;  Laterality: N/A;  Lumbar two-three, three-four XSTOP  . TONSILLECTOMY     Social History:  reports that he has quit smoking. His smoking use included cigarettes. He has a 3.00 pack-year smoking history. He has never used smokeless tobacco. He reports that he drinks about 14.0 standard drinks of alcohol per week. He reports that he does not use drugs.  Allergies  Allergen Reactions  . Naprosyn [Naproxen] Rash    Family History  Problem Relation Age of Onset  . Heart failure Mother   . Diabetes Mother   . Arthritis Mother   . High blood pressure Mother   . Heart failure Father   . Colon cancer Neg Hx      Prior to Admission medications  Medication Sig Start Date End Date Taking? Authorizing Provider  amiodarone (PACERONE) 100 MG tablet Take 100 mg by mouth daily. 09/16/17  Yes [provider]  atorvastatin (LIPITOR) 20 MG tablet Take 20 mg by mouth daily. 12/04/17  Yes [provider]  desmopressin (DDAVP) 0.2 MG tablet Take 0.2 mg by mouth at bedtime. 02/21/18  Yes [provider]  diltiazem (DILACOR XR) 120 MG 24 hr capsule Take 120 mg by mouth daily.    Yes [provider]  DULoxetine (CYMBALTA) 30 MG capsule Take 90 mg by mouth daily.    Yes [provider]  gabapentin (NEURONTIN) 300 MG capsule Take 300 mg by mouth 2 (two) times daily. 01/22/18  Yes [provider]  lactulose (CHRONULAC) 10  GM/15ML solution Take 45 mLs by mouth daily. 02/16/18  Yes [provider]  morphine (MSIR) 30 MG tablet Take 30 mg by mouth 2 (two) times daily.    Yes [provider]  Multiple Vitamins-Minerals (VISION FORMULA EYE HEALTH) CAPS Take 1 tablet by mouth 2 (two) times daily. [employer]   Yes [provider]  oxyCODONE-acetaminophen (PERCOCET) 10-325 MG per tablet Take 1 tablet by mouth 3 (three) times daily as needed for pain.   Yes [provider]  oxymetazoline (AFRIN) 0.05 % nasal spray Place 1 spray into both nostrils at bedtime as needed for congestion (sinuses).   Yes [provider]  pantoprazole (PROTONIX) 40 MG tablet Take 40 mg by mouth 2 (two) times daily.   Yes [provider]  rosuvastatin (CRESTOR) 20 MG tablet Take 20 mg by mouth daily.    Yes [provider]  triamterene-hydrochlorothiazide (MAXZIDE-25) 37.5-25 MG per tablet Take 0.5 tablets by mouth every Monday, Wednesday, and Friday.    Yes [provider]  XARELTO 15 MG TABS tablet Take 15 mg by mouth at bedtime. 02/01/18  Yes [provider]  zolpidem (AMBIEN) 10 MG tablet Take 10 mg by mouth at bedtime. For insomnia.   Yes [provider]  clotrimazole (LOTRIMIN) 1 % cream Apply topically 2 (two) times daily. Patient not taking: Reported on 03/09/2018 07/01/14   Leanna Battles, MD    Physical Exam: Vitals:   03/09/18 1130 03/09/18 1145 03/09/18 1519 03/09/18 1641  BP: 129/68 131/68 112/80 108/66  Pulse: 81  80 77  Resp: 17   18  Temp:      TempSrc:      SpO2: 98% 98% 94% 96%  Weight:      Height:        General: Alert, Awake and Oriented to Time and Person. Appear in moderate distress, affect irritable Eyes: PERRL, Conjunctiva normal ENT: Oral Mucosa clear dry. Neck: difficult to assess  JVD, no Abnormal Mass Or lumps Cardiovascular: S1 and S2 Present, no Murmur, Peripheral Pulses Present Respiratory: normal respiratory  effort, Bilateral Air entry equal and Decreased, no use of accessory muscle, Clear to Auscultation, no Crackles, no wheezes Abdomen: Bowel Sound [present, Soft and no tenderness, no hernia Skin: no redness, no Rash, no induration Extremities: bilateral  Pedal edema, no calf tenderness Neurologic: Grossly no focal neuro deficit.  Upper extremity examination is limited.  No asterixis present.  Lower extremity examination remitted as well due to patient's poor cooperation.  Labs on Admission:  CBC: Recent Labs  Lab 03/09/18 1155  WBC 10.7*  NEUTROABS 7.5  HGB 11.3*  HCT 34.9*  MCV 93.3  PLT 458   Basic Metabolic Panel: Recent Labs  Lab 03/09/18  1155  NA 132*  K 4.1  CL 100  CO2 21*  GLUCOSE 110*  BUN 30*  CREATININE 1.32*  CALCIUM 8.8*   GFR: Estimated Creatinine Clearance: 65.9 mL/min (A) (by C-G formula based on SCr of 1.32 mg/dL (H)). Liver Function Tests: Recent Labs  Lab 03/09/18 1155  AST 20  ALT 22  ALKPHOS 53  BILITOT 1.0  PROT 6.2*  ALBUMIN 3.1*   No results for input(s): LIPASE, AMYLASE in the last 168 hours. Recent Labs  Lab 03/09/18 1155  AMMONIA 15   Coagulation Profile: No results for input(s): INR, PROTIME in the last 168 hours. Cardiac Enzymes: Recent Labs  Lab 03/09/18 1155  TROPONINI <0.03   BNP (last 3 results) No results for input(s): PROBNP in the last 8760 hours. HbA1C: No results for input(s): HGBA1C in the last 72 hours. CBG: No results for input(s): GLUCAP in the last 168 hours. Lipid Profile: No results for input(s): CHOL, HDL, LDLCALC, TRIG, CHOLHDL, LDLDIRECT in the last 72 hours. Thyroid Function Tests: No results for input(s): TSH, T4TOTAL, FREET4, T3FREE, THYROIDAB in the last 72 hours. Anemia Panel: No results for input(s): VITAMINB12, FOLATE, FERRITIN, TIBC, IRON, RETICCTPCT in the last 72 hours. Urine analysis:    Component Value Date/Time   COLORURINE YELLOW 03/09/2018 1128   APPEARANCEUR CLEAR 03/09/2018 1128     LABSPEC 1.019 03/09/2018 1128   PHURINE 5.0 03/09/2018 1128   GLUCOSEU NEGATIVE 03/09/2018 1128   HGBUR NEGATIVE 03/09/2018 1128   BILIRUBINUR NEGATIVE 03/09/2018 1128   KETONESUR NEGATIVE 03/09/2018 1128   PROTEINUR NEGATIVE 03/09/2018 1128   UROBILINOGEN 1.0 06/29/2014 1117   NITRITE NEGATIVE 03/09/2018 1128   LEUKOCYTESUR NEGATIVE 03/09/2018 1128    Radiological Exams on Admission: Dg Chest 2 View  Result Date: 03/09/2018 CLINICAL DATA:  Altered mental status EXAM: CHEST - 2 VIEW COMPARISON:  05/01/2016 FINDINGS: The heart size and mediastinal contours are within normal limits. Both lungs are clear. The visualized skeletal structures are unremarkable. Left shoulder replacement. IMPRESSION: No active cardiopulmonary disease. Electronically Signed   By: Franchot Gallo M.D.   On: 03/09/2018 12:32   Dg Shoulder Right  Result Date: 03/09/2018 CLINICAL DATA:  Fall EXAM: RIGHT SHOULDER - 2+ VIEW COMPARISON:  03/04/2018 FINDINGS: Impacted fracture right humeral neck with mild angulation. Mild widening of the shoulder joint may represent effusion. No fracture of the clavicle or scapula. IMPRESSION: Impacted fracture right humeral neck Electronically Signed   By: Franchot Gallo M.D.   On: 03/09/2018 12:34   Ct Head Wo Contrast  Result Date: 03/09/2018 CLINICAL DATA:  Fall 5 days ago. Decreased mobility and subsequent weakness. History of hypertension, hypercholesterolemia. EXAM: CT HEAD WITHOUT CONTRAST TECHNIQUE: Contiguous axial images were obtained from the base of the skull through the vertex without intravenous contrast. COMPARISON:  CT HEAD November 14, 2016 FINDINGS: BRAIN: No intraparenchymal hemorrhage, mass effect nor midline shift. Moderate to severe supratentorial parenchymal brain volume loss with somewhat disproportionate mid brain atrophy. No hydrocephalus. Patchy supratentorial white matter hypodensities. No acute large vascular territory infarcts. No abnormal extra-axial fluid  collections. Basal cisterns are patent. VASCULAR: Moderate calcific atherosclerosis of the carotid siphons. SKULL: No skull fracture. No significant scalp soft tissue swelling. SINUSES/ORBITS: Moderate paranasal sinus mucosal thickening. Mastoid air cells are well aerated.The included ocular globes and orbital contents are non-suspicious. Status post bilateral ocular lens implants. OTHER: None. IMPRESSION: 1. No acute intracranial process. 2. Stable moderate to severe parenchymal brain volume loss and moderate chronic small vessel ischemic changes.  Electronically Signed   By: Elon Alas M.D.   On: 03/09/2018 13:30   Assessment/Plan 1.  Acute encephalopathy. Likely toxic in the nature of use of oral narcotics. Patient does have a humerus fracture which is new and has required more pain medication. For now we will continue his prior home regimen and monitor in the hospital. CT of the head is unremarkable, UA unremarkable.  No evidence of active infection. MRI brain ordered. We will monitor the results of the MRI brain. Add scheduled Seroquel. PT OT consulted as well.  2.  Right humerus fracture. EDP has informed patient's family regarding discussion with Dr. Rigoberto Noel who is the orthopedic doctor on call for follow-up.  3.  OSA. Continue CPAP.  4.  Chronic back pain. Patient is on multiple medication gabapentin and Cymbalta as well as scheduled morphine. Monitor.  5.  Essential hypertension. Paroxysmal A. fib Patient is on Cardizem will continue the same. Continue amiodarone as well as continue Xarelto.  Nutrition: Cardiac diet DVT Prophylaxis: on therapeutic anticoagulation.  Advance goals of care discussion: full code   Consults: none  Family Communication: no family was present at bedside, at the time of interview.  Disposition: Admitted as observation, med-surge unit. Likely to be discharged home, in 1-2 days.  Author: Berle Mull, MD [facility] 03/09/2018  If  7PM-7AM, please contact night-coverage [email or web address]

## 2018-03-10 DIAGNOSIS — I480 Paroxysmal atrial fibrillation: Secondary | ICD-10-CM | POA: Diagnosis present

## 2018-03-10 DIAGNOSIS — E7800 Pure hypercholesterolemia, unspecified: Secondary | ICD-10-CM | POA: Diagnosis present

## 2018-03-10 DIAGNOSIS — S0990XA Unspecified injury of head, initial encounter: Secondary | ICD-10-CM | POA: Diagnosis present

## 2018-03-10 DIAGNOSIS — E785 Hyperlipidemia, unspecified: Secondary | ICD-10-CM | POA: Diagnosis present

## 2018-03-10 DIAGNOSIS — R4182 Altered mental status, unspecified: Secondary | ICD-10-CM | POA: Diagnosis not present

## 2018-03-10 DIAGNOSIS — Z8249 Family history of ischemic heart disease and other diseases of the circulatory system: Secondary | ICD-10-CM | POA: Diagnosis not present

## 2018-03-10 DIAGNOSIS — Z79891 Long term (current) use of opiate analgesic: Secondary | ICD-10-CM | POA: Diagnosis not present

## 2018-03-10 DIAGNOSIS — S42211A Unspecified displaced fracture of surgical neck of right humerus, initial encounter for closed fracture: Secondary | ICD-10-CM | POA: Diagnosis present

## 2018-03-10 DIAGNOSIS — G894 Chronic pain syndrome: Secondary | ICD-10-CM | POA: Diagnosis present

## 2018-03-10 DIAGNOSIS — Z7902 Long term (current) use of antithrombotics/antiplatelets: Secondary | ICD-10-CM | POA: Diagnosis not present

## 2018-03-10 DIAGNOSIS — F0280 Dementia in other diseases classified elsewhere without behavioral disturbance: Secondary | ICD-10-CM | POA: Diagnosis present

## 2018-03-10 DIAGNOSIS — Z7901 Long term (current) use of anticoagulants: Secondary | ICD-10-CM | POA: Diagnosis not present

## 2018-03-10 DIAGNOSIS — Z6841 Body Mass Index (BMI) 40.0 and over, adult: Secondary | ICD-10-CM | POA: Diagnosis not present

## 2018-03-10 DIAGNOSIS — I2510 Atherosclerotic heart disease of native coronary artery without angina pectoris: Secondary | ICD-10-CM | POA: Diagnosis present

## 2018-03-10 DIAGNOSIS — G2581 Restless legs syndrome: Secondary | ICD-10-CM | POA: Diagnosis present

## 2018-03-10 DIAGNOSIS — Z5320 Procedure and treatment not carried out because of patient's decision for unspecified reasons: Secondary | ICD-10-CM | POA: Diagnosis not present

## 2018-03-10 DIAGNOSIS — G319 Degenerative disease of nervous system, unspecified: Secondary | ICD-10-CM | POA: Diagnosis present

## 2018-03-10 DIAGNOSIS — G92 Toxic encephalopathy: Secondary | ICD-10-CM | POA: Diagnosis present

## 2018-03-10 DIAGNOSIS — F05 Delirium due to known physiological condition: Secondary | ICD-10-CM | POA: Diagnosis present

## 2018-03-10 DIAGNOSIS — M549 Dorsalgia, unspecified: Secondary | ICD-10-CM | POA: Diagnosis present

## 2018-03-10 DIAGNOSIS — E662 Morbid (severe) obesity with alveolar hypoventilation: Secondary | ICD-10-CM | POA: Diagnosis present

## 2018-03-10 DIAGNOSIS — I6529 Occlusion and stenosis of unspecified carotid artery: Secondary | ICD-10-CM | POA: Diagnosis present

## 2018-03-10 DIAGNOSIS — D649 Anemia, unspecified: Secondary | ICD-10-CM | POA: Diagnosis present

## 2018-03-10 DIAGNOSIS — I10 Essential (primary) hypertension: Secondary | ICD-10-CM | POA: Diagnosis present

## 2018-03-10 DIAGNOSIS — Z8261 Family history of arthritis: Secondary | ICD-10-CM | POA: Diagnosis not present

## 2018-03-10 DIAGNOSIS — R410 Disorientation, unspecified: Secondary | ICD-10-CM | POA: Diagnosis present

## 2018-03-10 DIAGNOSIS — W19XXXA Unspecified fall, initial encounter: Secondary | ICD-10-CM | POA: Diagnosis present

## 2018-03-10 LAB — COMPREHENSIVE METABOLIC PANEL
ALBUMIN: 3 g/dL — AB (ref 3.5–5.0)
ALK PHOS: 51 U/L (ref 38–126)
ALT: 22 U/L (ref 0–44)
AST: 25 U/L (ref 15–41)
Anion gap: 9 (ref 5–15)
BUN: 23 mg/dL (ref 8–23)
CHLORIDE: 101 mmol/L (ref 98–111)
CO2: 26 mmol/L (ref 22–32)
CREATININE: 1.24 mg/dL (ref 0.61–1.24)
Calcium: 8.9 mg/dL (ref 8.9–10.3)
GFR calc non Af Amer: 54 mL/min — ABNORMAL LOW (ref >60)
GLUCOSE: 107 mg/dL — AB (ref 70–99)
Potassium: 3.9 mmol/L (ref 3.5–5.1)
SODIUM: 136 mmol/L (ref 135–145)
Total Bilirubin: 1 mg/dL (ref 0.3–1.2)
Total Protein: 6.2 g/dL — ABNORMAL LOW (ref 6.5–8.1)

## 2018-03-10 LAB — CBC
HCT: 33.1 % — ABNORMAL LOW (ref 39.0–52.0)
HEMOGLOBIN: 10.5 g/dL — AB (ref 13.0–17.0)
MCH: 29.4 pg (ref 26.0–34.0)
MCHC: 31.7 g/dL (ref 30.0–36.0)
MCV: 92.7 fL (ref 80.0–100.0)
NRBC: 0 % (ref 0.0–0.2)
PLATELETS: 401 10*3/uL — AB (ref 150–400)
RBC: 3.57 MIL/uL — AB (ref 4.22–5.81)
RDW: 13.1 % (ref 11.5–15.5)
WBC: 10.1 10*3/uL (ref 4.0–10.5)

## 2018-03-10 LAB — T4, FREE: Free T4: 0.84 ng/dL (ref 0.82–1.77)

## 2018-03-10 LAB — URINE CULTURE
CULTURE: NO GROWTH
SPECIAL REQUESTS: NORMAL

## 2018-03-10 LAB — TSH: TSH: 3.88 u[IU]/mL (ref 0.350–4.500)

## 2018-03-10 MED ORDER — ACETAMINOPHEN 500 MG PO TABS
1000.0000 mg | ORAL_TABLET | Freq: Three times a day (TID) | ORAL | Status: DC
  Administered 2018-03-10 – 2018-03-13 (×9): 1000 mg via ORAL
  Filled 2018-03-10 (×9): qty 2

## 2018-03-10 MED ORDER — MORPHINE SULFATE ER 15 MG PO TBCR
15.0000 mg | EXTENDED_RELEASE_TABLET | Freq: Two times a day (BID) | ORAL | Status: DC
  Administered 2018-03-10 – 2018-03-13 (×6): 15 mg via ORAL
  Filled 2018-03-10 (×6): qty 1

## 2018-03-10 MED ORDER — RIVAROXABAN 20 MG PO TABS
20.0000 mg | ORAL_TABLET | Freq: Every day | ORAL | Status: DC
  Administered 2018-03-10 – 2018-03-12 (×3): 20 mg via ORAL
  Filled 2018-03-10 (×3): qty 1

## 2018-03-10 MED ORDER — LORAZEPAM 2 MG/ML IJ SOLN
1.0000 mg | Freq: Four times a day (QID) | INTRAMUSCULAR | Status: DC | PRN
  Administered 2018-03-10 – 2018-03-11 (×2): 1 mg via INTRAVENOUS
  Filled 2018-03-10 (×2): qty 1

## 2018-03-10 MED ORDER — BISACODYL 10 MG RE SUPP: 10.0000 mg | Freq: Every day | RECTAL | Status: DC | PRN

## 2018-03-10 MED ORDER — ADULT MULTIVITAMIN W/MINERALS CH
1.0000 | ORAL_TABLET | Freq: Every day | ORAL | Status: DC
  Administered 2018-03-10 – 2018-03-13 (×4): 1 via ORAL
  Filled 2018-03-10 (×4): qty 1

## 2018-03-10 MED ORDER — LORAZEPAM 1 MG PO TABS
1.0000 mg | ORAL_TABLET | Freq: Four times a day (QID) | ORAL | Status: DC | PRN
  Administered 2018-03-10: 1 mg via ORAL
  Filled 2018-03-10: qty 1

## 2018-03-10 MED ORDER — VITAMIN B-1 100 MG PO TABS
100.0000 mg | ORAL_TABLET | Freq: Every day | ORAL | Status: DC
  Administered 2018-03-10 – 2018-03-13 (×4): 100 mg via ORAL
  Filled 2018-03-10 (×4): qty 1

## 2018-03-10 MED ORDER — FOLIC ACID 1 MG PO TABS
1.0000 mg | ORAL_TABLET | Freq: Every day | ORAL | Status: DC
  Administered 2018-03-10 – 2018-03-13 (×4): 1 mg via ORAL
  Filled 2018-03-10 (×4): qty 1

## 2018-03-10 MED ORDER — THIAMINE HCL 100 MG/ML IJ SOLN: 100.0000 mg | Freq: Every day | INTRAMUSCULAR | Status: DC

## 2018-03-10 MED ORDER — POLYETHYLENE GLYCOL 3350 17 G PO PACK
17.0000 g | PACK | Freq: Every day | ORAL | Status: DC
  Administered 2018-03-10 – 2018-03-12 (×3): 17 g via ORAL
  Filled 2018-03-10 (×3): qty 1

## 2018-03-10 NOTE — Progress Notes (Signed)
The patient's wife is here at the bedside- she is requesting "[employer]"- presently the patient and his wife is living in a cottage at [place] which is independent.  The wife reports that she is unable to take care of the patient at this time.

## 2018-03-10 NOTE — Care Management Note (Signed)
Case Management Note  Patient Details  Name: Carlos Hanson MRN: [id number] Date of Birth: 1940/05/28  Subjective/Objective:                    Action/Plan:  Spoke w wife at bedside. She states they are from the cottages at [place], they live in the ILF portion. Wife states she wants patient to go to SNF portion at [place], as she cannot provide ADL support to him at home safely. She states she has been in contact with them trying to secure this since yesterday. Provided this information to [place] to follow up on.   Expected Discharge Date:                  Expected Discharge Plan:  Skilled Nursing Facility  In-House Referral:  Clinical Social Work  Discharge planning Services  CM Consult  Post Acute Care Choice:    Choice offered to:     DME Arranged:    DME Agency:     HH Arranged:    [place] Agency:     Status of Service:  In process, will continue to follow  If discussed at Long Length of Stay Meetings, dates discussed:    Additional Comments:  Carles Collet, RN 03/10/2018, 2:29 PM

## 2018-03-10 NOTE — Progress Notes (Signed)
Progress Note    Carlos Hanson  JGG:[id number] DOB: 13-Oct-1940  DOA: 03/09/2018 PCP: Leanna Battles, MD    Brief Narrative:     Medical records reviewed and are as summarized below:  [name] is an 77 y.o. male with Past medical history of OSA, CAD, HLD, HTN, chronic back pain, recent fall with humerus fracture. Patient presents with complaints of confusion ongoing for last few days.  Had a fall 5 days ago and was seen in an OSH ED at which time he was told that he had a rotator cuff injury and patient was placed in a sling and was recommended oral narcotics. Patient has been taking increased dose of Percocets and has become more confused over last few days.  Became agitated and therefore was brought to the hospital.   Assessment/Plan:   Active Problems:   Altered mental status  Acute encephalopathy Likely toxic in the nature of increased use of oral narcotics. -still not back to baseline-- no family available but per chart review patient is normally conversant and oriented to person/place/time - UA unremarkable.  No evidence of active infection. -r/o urinary retention/fecal impaction -PT/OT -ammonia/TSH normal -MRI brain: Moderate chronic microvascular ischemic changes and moderate to severe volume loss of the brain. - have schedule tylenol, reduced PRN pain medication for now, may need further reduction, depending on clinical course -hold ambien for now  H/o alcohol use -CIWA protocol with vitamin replacement  Anemia- unclear etiology -trend -further work up if needed in hospital  Impacted Right humerus fracture. -ER spoke with Dr. Erlinda Hong, plan is for sling and outpatient follow up   OSA. Continue CPAP -needs when napping as well as QHS -no CPAP in the room when I evaluated patient earlier today  Chronic back pain. Patient is on multiple medication gabapentin and Cymbalta as well as scheduled morphine-- was being given IR but actually on ER  Essential  hypertension with Paroxysmal A. fib -Cardizem - -amiodarone as well as continue Xarelto (xarelto will need to be discussed with PCP as patient appears to have multiple falls over last few years  obesity Body mass index is 40.69 kg/m.   Ran board of pharmacy:  03/04/2018  2   03/02/2018  Morphine Sulf Er 30 Mg Tablet  60.00 30 Da Fraser Din  [id number]  [place] ([id number])  0/0 60.00 MME Medicare  [place]  02/18/2018  2   02/15/2018  Oxycodone-Acetaminophen 10-325  90.00 30 Da Fraser Din  [id number]  [place] ([id number])  0/0 45.00 MME Private Pay  [place]  02/16/2018  1   11/19/2017  Zolpidem Tartrate 10 Mg Tablet  30.00 30 Da Fraser Din  [id number]  [place] ([id number])  3/3 0.50 LME Medicare  [place]  01/27/2018  1   01/26/2018  Morphine Sulf Er 30 Mg Tablet  60.00 30 Wi Sha  [id number]  [place] ([id number])  0/0 60.00 MME Medicare  [place]  01/17/2018  1   11/19/2017  Zolpidem Tartrate 10 Mg Tablet  30.00 30 Da Fraser Din  [id number]  [place] ([id number])  2/3 0.50 LME Medicare  [place]  12/30/2017  1   12/28/2017  Morphine Sulf Er 30 Mg Tablet  60.00 30 Da Fraser Din  [id number]  [place] ([id number])  0/0 60.00 MME Medicare  [place]  12/17/2017  1   11/19/2017  Zolpidem Tartrate 10 Mg Tablet  30.00 30 Da Fraser Din  [id number]  [place] ([id number])  1/3 0.50 LME Medicare  [place]  11/29/2017  1   11/29/2017  Morphine Sulf Er 30  Mg Tablet  60.00 30 Da Fraser Din  [id number]  [place] [phone]  0/0 60.00 MME Medicare  [place]  11/19/2017  1   11/19/2017  Zolpidem Tartrate 10 Mg Tablet  30.00 30 Da Fraser Din  [id number]  [place] ([id number])  0/3        Family Communication/Anticipated D/C date and plan/Code Status   DVT prophylaxis: xarelto Code Status: Full Code.  Family Communication: none at bedside Disposition Plan: pending-- still not at baseline clinically-- need further work up and monitoring   Medical Consultants:    None.    Subjective:   Will awaken but quickly falls asleep-- no CPAP in room  Objective:    Vitals:   03/09/18 1641 03/09/18 2100 03/10/18 0500 03/10/18 1100  BP: 108/66 125/71 111/85 (!) 140/102  Pulse: 77 79 90 86  Resp: 18 18 16 18     Temp:  97.9 F (36.6 C)    TempSrc:  Oral    SpO2: 96% 98% 90% 95%  Weight:      Height:        Intake/Output Summary (Last 24 hours) at 03/10/2018 1206 Last data filed at 03/10/2018 1000 Gross per 24 hour  Intake 480 ml  Output -  Net 480 ml   Filed Weights   03/09/18 1119  Weight: 136.1 kg    Exam: In chair. Left arm in side table drawer Will awaken but quickly falls asleep +BS, abdomen obese, NT +LE edema No increased work of breathing, protecting airway Not following commands  Data Reviewed:   I have personally reviewed following labs and imaging studies:  Labs: Labs show the following:   Basic Metabolic Panel: Recent Labs  Lab 03/09/18 1155 03/10/18 0823  NA 132* 136  K 4.1 3.9  CL 100 101  CO2 21* 26  GLUCOSE 110* 107*  BUN 30* 23  CREATININE 1.32* 1.24  CALCIUM 8.8* 8.9   GFR Estimated Creatinine Clearance: 70.1 mL/min (by C-G formula based on SCr of 1.24 mg/dL). Liver Function Tests: Recent Labs  Lab 03/09/18 1155 03/10/18 0823  AST 20 25  ALT 22 22  ALKPHOS 53 51  BILITOT 1.0 1.0  PROT 6.2* 6.2*  ALBUMIN 3.1* 3.0*   No results for input(s): LIPASE, AMYLASE in the last 168 hours. Recent Labs  Lab 03/09/18 1155  AMMONIA 15   Coagulation profile No results for input(s): INR, PROTIME in the last 168 hours.  CBC: Recent Labs  Lab 03/09/18 1155 03/10/18 0823  WBC 10.7* 10.1  NEUTROABS 7.5  --   HGB 11.3* 10.5*  HCT 34.9* 33.1*  MCV 93.3 92.7  PLT 369 401*   Cardiac Enzymes: Recent Labs  Lab 03/09/18 1155  TROPONINI <0.03   BNP (last 3 results) No results for input(s): PROBNP in the last 8760 hours. CBG: No results for input(s): GLUCAP in the last 168 hours. D-Dimer: No results for input(s): DDIMER in the last 72 hours. Hgb A1c: No results for input(s): HGBA1C in the last 72 hours. Lipid Profile: No results for input(s): CHOL, HDL, LDLCALC, TRIG, CHOLHDL, LDLDIRECT in the last 72 hours. Thyroid function  studies: Recent Labs    03/10/18 0823  TSH 3.880   Anemia work up: No results for input(s): VITAMINB12, FOLATE, FERRITIN, TIBC, IRON, RETICCTPCT in the last 72 hours. Sepsis Labs: Recent Labs  Lab 03/09/18 1155 03/10/18 0823  WBC 10.7* 10.1    Microbiology Recent Results (from the past 240 hour(s))  Urine culture     Status: None   Collection  Time: 03/09/18  2:50 PM  Result Value Ref Range Status   Specimen Description URINE, CLEAN CATCH  Final   Special Requests Normal  Final   Culture   Final    NO GROWTH Performed at [facility], [address], [place], [place] [address]    Report Status 03/10/2018 FINAL  Final    Procedures and diagnostic studies:  Dg Chest 2 View  Result Date: 03/09/2018 CLINICAL DATA:  Altered mental status EXAM: CHEST - 2 VIEW COMPARISON:  05/01/2016 FINDINGS: The heart size and mediastinal contours are within normal limits. Both lungs are clear. The visualized skeletal structures are unremarkable. Left shoulder replacement. IMPRESSION: No active cardiopulmonary disease. Electronically Signed   By: Franchot Gallo M.D.   On: 03/09/2018 12:32   Dg Shoulder Right  Result Date: 03/09/2018 CLINICAL DATA:  Fall EXAM: RIGHT SHOULDER - 2+ VIEW COMPARISON:  03/04/2018 FINDINGS: Impacted fracture right humeral neck with mild angulation. Mild widening of the shoulder joint may represent effusion. No fracture of the clavicle or scapula. IMPRESSION: Impacted fracture right humeral neck Electronically Signed   By: Franchot Gallo M.D.   On: 03/09/2018 12:34   Ct Head Wo Contrast  Result Date: 03/09/2018 CLINICAL DATA:  Fall 5 days ago. Decreased mobility and subsequent weakness. History of hypertension, hypercholesterolemia. EXAM: CT HEAD WITHOUT CONTRAST TECHNIQUE: Contiguous axial images were obtained from the base of the skull through the vertex without intravenous contrast. COMPARISON:  CT HEAD November 14, 2016 FINDINGS: BRAIN: No intraparenchymal  hemorrhage, mass effect nor midline shift. Moderate to severe supratentorial parenchymal brain volume loss with somewhat disproportionate mid brain atrophy. No hydrocephalus. Patchy supratentorial white matter hypodensities. No acute large vascular territory infarcts. No abnormal extra-axial fluid collections. Basal cisterns are patent. VASCULAR: Moderate calcific atherosclerosis of the carotid siphons. SKULL: No skull fracture. No significant scalp soft tissue swelling. SINUSES/ORBITS: Moderate paranasal sinus mucosal thickening. Mastoid air cells are well aerated.The included ocular globes and orbital contents are non-suspicious. Status post bilateral ocular lens implants. OTHER: None. IMPRESSION: 1. No acute intracranial process. 2. Stable moderate to severe parenchymal brain volume loss and moderate chronic small vessel ischemic changes. Electronically Signed   By: Elon Alas M.D.   On: 03/09/2018 13:30   Mr Brain Wo Contrast  Result Date: 03/09/2018 CLINICAL DATA:  77 y/o M; fall 5 days prior. Decreased mobility and subsequent weakness. EXAM: MRI HEAD WITHOUT CONTRAST TECHNIQUE: Multiplanar, multiecho pulse sequences of the brain and surrounding structures were obtained without intravenous contrast. COMPARISON:  02/06/2018 CT head. FINDINGS: Brain: Motion degradation of several sequences. No acute infarction, hemorrhage, hydrocephalus, extra-axial collection or mass lesion. Scattered nonspecific T2 hyperintensities in subcortical and periventricular white matter are compatible with moderate chronic microvascular ischemic changes for age. Moderate to severe volume loss of the brain. Motion degraded T2 FLAIR sequence. Vascular: Normal flow voids. Skull and upper cervical spine: Normal marrow signal. Sinuses/Orbits: No acute finding. Small left maxillary sinus mucous retention cyst and mild mucosal thickening of the ethmoid air cells. Other: Bilateral intra-ocular lens replacement. IMPRESSION: 1. Motion  degraded study. 2. No acute intracranial abnormality identified. 3. Moderate chronic microvascular ischemic changes and moderate to severe volume loss of the brain. Electronically Signed   By: Kristine Garbe M.D.   On: 03/09/2018 23:00    Medications:   . acetaminophen  1,000 mg Oral Q8H  . amiodarone  100 mg Oral Daily  . desmopressin  0.2 mg Oral QHS  . diltiazem  120 mg Oral  Daily  . DULoxetine  90 mg Oral Daily  . folic acid  1 mg Oral Daily  . gabapentin  300 mg Oral BID  . lactulose  20 g Oral BID  . morphine  30 mg Oral BID  . multivitamin with minerals  1 tablet Oral Daily  . pantoprazole  40 mg Oral BID  . polyethylene glycol  17 g Oral Daily  . Rivaroxaban  15 mg Oral Q supper  . rosuvastatin  20 mg Oral Daily  . sodium chloride flush  3 mL Intravenous Q12H  . thiamine  100 mg Oral Daily   Or  . thiamine  100 mg Intravenous Daily  . [START ON 03/11/2018] triamterene-hydrochlorothiazide  0.5 tablet Oral Q M,W,F  . zolpidem  5 mg Oral QHS   Continuous Infusions:   LOS: 0 days   Geradine Girt  [facility]   *Please refer to [email or web address], password [email or web address] to get updated schedule on who will round on this patient, as hospitalists switch teams weekly. If 7PM-7AM, please contact night-coverage at [email or web address], password [email or web address] for any overnight needs.  03/10/2018, 12:06 PM

## 2018-03-10 NOTE — Progress Notes (Addendum)
The patient was assisted from the recliner at the bedside to the bed.  Patient needed [facility] assistance.  The patient is incont of urine and a condom cath was placed for skin protection and hygiene.  The patient continues to have confusion and will remove his sling off of his right arm numerous times. The patient is placed back in the bed with floor matts at the bedside, side rails up x 3 and the call light is resting on his chest, within his reach.  Will continue to check on him frequently.

## 2018-03-10 NOTE — Progress Notes (Signed)
Vital sign re-assessed.  BP has improved.  The patient is resting with eyes closed but responds with tactile or verbal approach.  The patient continues to have confusion.

## 2018-03-10 NOTE — Progress Notes (Signed)
The patient appears restless and currently has an elevated BP reading.  Ativan 1 mg given for hypertension and restlessness

## 2018-03-10 NOTE — Progress Notes (Signed)
Orthopedic Tech Progress Note Patient Details:  Carlos Hanson 1940-12-24 [id number]  Ortho Devices Type of Ortho Device: Arm sling Ortho Device/Splint Interventions: Application   Post Interventions Patient Tolerated: Well Instructions Provided: Care of device   [name] 03/10/2018, 8:51 AM

## 2018-03-10 NOTE — Progress Notes (Signed)
Patient continues to be confused.  The patient refused lab work but eventually allowed to have labs drawn after explaining the procedure and the reason for lab work.  Most of the time, the patient's eyes are closed and falls back to sleep.

## 2018-03-10 NOTE — Progress Notes (Signed)
Anderson Malta- ortho tech notified of the order/need for a sling to the right arm.

## 2018-03-11 LAB — CBC
HEMATOCRIT: 31.9 % — AB (ref 39.0–52.0)
HEMOGLOBIN: 10.5 g/dL — AB (ref 13.0–17.0)
MCH: 29.8 pg (ref 26.0–34.0)
MCHC: 32.9 g/dL (ref 30.0–36.0)
MCV: 90.6 fL (ref 80.0–100.0)
Platelets: 380 10*3/uL (ref 150–400)
RBC: 3.52 MIL/uL — AB (ref 4.22–5.81)
RDW: 13.2 % (ref 11.5–15.5)
WBC: 11 10*3/uL — ABNORMAL HIGH (ref 4.0–10.5)
nRBC: 0 % (ref 0.0–0.2)

## 2018-03-11 LAB — BASIC METABOLIC PANEL
Anion gap: 11 (ref 5–15)
BUN: 22 mg/dL (ref 8–23)
CHLORIDE: 100 mmol/L (ref 98–111)
CO2: 23 mmol/L (ref 22–32)
CREATININE: 1.19 mg/dL (ref 0.61–1.24)
Calcium: 8.8 mg/dL — ABNORMAL LOW (ref 8.9–10.3)
GFR calc Af Amer: >60 mL/min (ref >60)
GFR calc non Af Amer: 57 mL/min — ABNORMAL LOW (ref >60)
GLUCOSE: 112 mg/dL — AB (ref 70–99)
POTASSIUM: 3.8 mmol/L (ref 3.5–5.1)
Sodium: 134 mmol/L — ABNORMAL LOW (ref 135–145)

## 2018-03-11 MED ORDER — GABAPENTIN 100 MG PO CAPS
100.0000 mg | ORAL_CAPSULE | Freq: Two times a day (BID) | ORAL | Status: DC
  Administered 2018-03-11 – 2018-03-13 (×4): 100 mg via ORAL
  Filled 2018-03-11 (×4): qty 1

## 2018-03-11 NOTE — Clinical Social Work Note (Signed)
Clinical Social Work Assessment  Patient Details  Name: Carlos Hanson MRN: [id number] Date of Birth: 04-05-1941  Date of referral:  03/11/18               Reason for consult:  Discharge Planning                Permission sought to share information with:  Case Manager, Facility Sport and exercise psychologist, Family Supports Permission granted to share information::  Yes, Verbal Permission Granted  Name::     Nature conservation officer::  SNFs  Relationship::  spouse  Contact Information:  [phone]  Housing/Transportation Living arrangements for the past 2 months:  [place] of Information:  Patient Patient Interpreter Needed:  None Criminal Activity/Legal Involvement Pertinent to Current Situation/Hospitalization:  No - Comment as needed Significant Relationships:  Spouse Lives with:  Facility Resident Do you feel safe going back to the place where you live?  No Need for family participation in patient care:  Yes (Comment)  Care giving concerns:  CSW received referral for possible SNF placement at time of discharge. Spoke with patient regarding possibility of SNF placement . Patient's  wife is currently unable to care for him at their home given patient's current needs and fall risk.  Patient and wife   expressed understanding of PT recommendation and are agreeable to SNF placement at time of discharge. CSW to continue to follow and assist with discharge planning needs.     Social Worker assessment / plan:  Spoke with patient and  wife   concerning possibility of rehab at [facility] before returning home.    Employment status:  Retired Forensic scientist:  Other (Comment Required)([employer]) PT Recommendations:  [place] / Referral to community resources:  [place]  Patient/Family's Response to care:  Patient and  wife   recognize need for rehab before returning home and are agreeable to a SNF in [place]. They report  preference for  [facility] SNF as they are from ILF side . CSW explained insurance authorization process. Patient's family reported that they want patient to get stronger to be able to come back home.    Patient/Family's Understanding of and Emotional Response to Diagnosis, Current Treatment, and Prognosis:  Patient/family is realistic regarding therapy needs and expressed being hopeful for SNF placement. Patient expressed understanding of CSW role and discharge process as well as medical condition. No questions/concerns about plan or treatment.    Emotional Assessment Appearance:  Appears stated age Attitude/Demeanor/Rapport:  Engaged, Gracious Affect (typically observed):  Accepting Orientation:  Oriented to Self Alcohol / Substance use:  Not Applicable Psych involvement (Current and /or in the community):  No (Comment)  Discharge Needs  Concerns to be addressed:  Discharge Planning Concerns Readmission within the last 30 days:  No Current discharge risk:  Dependent with Mobility Barriers to Discharge:  Continued Medical Work up   [employer], LCSW 03/11/2018, 1:04 PM

## 2018-03-11 NOTE — Plan of Care (Signed)
  Problem: Coping: Goal: Level of anxiety will decrease Outcome: Not Progressing   Problem: Clinical Measurements: Goal: Ability to maintain clinical measurements within normal limits will improve Outcome: Progressing   Problem: Pain Managment: Goal: General experience of comfort will improve Outcome: Progressing

## 2018-03-11 NOTE — Discharge Instructions (Signed)

## 2018-03-11 NOTE — Progress Notes (Signed)
Respiratory notified of the need for CPAP

## 2018-03-11 NOTE — Plan of Care (Signed)
  Problem: Pain Managment: Goal: General experience of comfort will improve Outcome: Progressing   Problem: Safety: Goal: Ability to remain free from injury will improve Outcome: Progressing   Problem: Skin Integrity: Goal: Risk for impaired skin integrity will decrease Outcome: Progressing   

## 2018-03-11 NOTE — Evaluation (Signed)
Occupational Therapy Evaluation Patient Details Name: Carlos Hanson MRN: [id number] DOB: 1940/08/26 Today's Date: 03/11/2018    History of Present Illness Pt is a 77 y.o. male who presents with confusion s/p admission for R humeral fx resulting from a fall. PMH of OSA, CAD, HLD, HTN, chronic back pain   Clinical Impression   PTA (initial fall) Pt was very high functioning - independent in ADL and mobility with SPC, driving, etc. Wife did manage medications but he maintained his own schedule. Pt is currently max A for all ADL. Max A +3 for sit <>stand transfers - cues very important. Pt will require skilled OT in the acute setting as well as afterwards at the SNF level to maximize safety and independence in ADL and functional transfers as well as continue monitor and progress RUE as ordered by MD. RN staff instructed to use lift equipment with Pt.     Follow Up Recommendations  SNF;Supervision/Assistance - 24 hour    Equipment Recommendations  Other (comment)(defer to next venue of care)    Recommendations for Other Services       Precautions / Restrictions Precautions Precautions: Shoulder Type of Shoulder Precautions: R humeral fx Shoulder Interventions: Shoulder sling/immobilizer;At all times Precaution Booklet Issued: No Required Braces or Orthoses: Sling Restrictions Weight Bearing Restrictions: Yes RUE Weight Bearing: Non weight bearing      Mobility Bed Mobility Overal bed mobility: Needs Assistance Bed Mobility: Supine to Sit     Supine to sit: +2 for physical assistance;HOB elevated;Mod assist     General bed mobility comments: pt unable to turn to EOB without use of RUE, he was able to provide assist in trunk and through LE with difficulties moving RLE to EOB. Sits on EOB leaning left, weightbearing through LLE, keeping RLE off the ground.  Transfers Overall transfer level: Needs assistance Equipment used: 2 person hand held assist Transfers: Sit to/from  Stand Sit to Stand: +2 physical assistance;+2 safety/equipment;Max assist         General transfer comment: Pt is MaxAx3 for powerup, maintaining standing, and moving equipment to move from EOB to [facility] to chair. Vc given to lift chest up, push through LE and straighten knees. Quick to fatigue with LE shaking and knee buckling. Knee block needed to maintain upright for transfers.     Balance Overall balance assessment: Needs assistance Sitting-balance support: Single extremity supported;Feet supported Sitting balance-Leahy Scale: Fair   Postural control: Left lateral lean Standing balance support: Bilateral upper extremity supported Standing balance-Leahy Scale: Poor                             ADL either performed or assessed with clinical judgement   ADL Overall ADL's : Needs assistance/impaired Eating/Feeding: Maximal assistance;Sitting   Grooming: Maximal assistance;Sitting   Upper Body Bathing: Maximal assistance   Lower Body Bathing: Total assistance   Upper Body Dressing : Maximal assistance;Sitting Upper Body Dressing Details (indicate cue type and reason): to adjust/reposition sling Lower Body Dressing: Maximal assistance;+2 for physical assistance;+2 for safety/equipment   Toilet Transfer: Maximal assistance;+2 for physical assistance;+2 for safety/equipment;BSC Toilet Transfer Details (indicate cue type and reason): Pt only able to perform sit<>stand, furniture moved around the Pt Toileting- Clothing Manipulation and Hygiene: Total assistance;Sit to/from stand;+2 for physical assistance;+2 for safety/equipment Toileting - Clothing Manipulation Details (indicate cue type and reason): +2 assist max A to stand, 3rd person performing peri care     Functional mobility during  ADLs: (unable at this time) General ADL Comments: Pt is max A for all aspects of ADL at this time due to RUE deficits/limitations and generlized weakness and cognitive deficits      Vision         Perception     Praxis      Pertinent Vitals/Pain Pain Assessment: 0-10 Pain Score: 8  Pain Location: R shoulder, upper arm Pain Descriptors / Indicators: Sharp;Guarding;Grimacing;Moaning Pain Intervention(s): Limited activity within patient's tolerance;Monitored during session;Other (comment)(repositioned sling)     Hand Dominance Right   Extremity/Trunk Assessment Upper Extremity Assessment Upper Extremity Assessment: RUE deficits/detail RUE Deficits / Details: in sling, able to grasp with hand, and perform wrist and forearm rotation RUE: Unable to fully assess due to immobilization RUE Coordination: decreased gross motor   Lower Extremity Assessment Lower Extremity Assessment: Defer to PT evaluation RLE Deficits / Details: MMT: Hip flexion 4-/5, knee extenion 4-/5 LLE Deficits / Details: MMT: Hip flexion 4-/5, Knee extension 3+/5   Cervical / Trunk Assessment Cervical / Trunk Assessment: Normal   Communication Communication Communication: No difficulties   Cognition Arousal/Alertness: Awake/alert Behavior During Therapy: WFL for tasks assessed/performed Overall Cognitive Status: Impaired/Different from baseline Area of Impairment: Orientation;Attention;Memory;Following commands;Awareness;Safety/judgement                 Orientation Level: Disoriented to;Place;Situation     Following Commands: Follows one step commands with increased time;Follows multi-step commands inconsistently Safety/Judgement: Decreased awareness of deficits;Decreased awareness of safety     General Comments: Wife says some memory deficits at baseline with medications and other IADLs; pt shows some confusion of location, deficits noted in sustained and divided attention   General Comments  Pt with clear "gel-like" BM. RN notified.    Exercises     Shoulder Instructions      Home Living Family/patient expects to be discharged to:: Private  residence(ILF) Living Arrangements: Spouse/significant other Available Help at Discharge: Family Type of Home: Independent living facility             Bathroom Shower/Tub: Tub/shower unit   Bathroom Toilet: Handicapped height Bathroom Accessibility: Yes   Home Equipment: [place] - single point;Shower seat - built in;Grab bars - tub/shower;Hand held Tourist information centre manager - 4 wheels          Prior Functioning/Environment Level of Independence: Independent with assistive device(s)        Comments: uses a SPC; wife manages medicines; drives; some baseline memory deficits but still able to perform ADLs independently.        OT Problem List: Decreased range of motion;Decreased strength;Decreased activity tolerance;Impaired balance (sitting and/or standing);Decreased cognition;Decreased safety awareness;Decreased knowledge of use of DME or AE;Decreased knowledge of precautions;Obesity;Impaired UE functional use;Pain      OT Treatment/Interventions: Self-care/ADL training;DME and/or AE instruction;Therapeutic activities;Patient/family education;Balance training    OT Goals(Current goals can be found in the care plan section) Acute Rehab OT Goals Patient Stated Goal: "go home" OT Goal Formulation: With patient/family Time For Goal Achievement: 03/25/18 Potential to Achieve Goals: Good ADL Goals Pt Will Perform Eating: with set-up;sitting Pt Will Perform Grooming: with set-up;with caregiver independent in assisting;sitting Pt Will Transfer to Toilet: with mod assist;stand pivot transfer;with +2 assist;bedside commode Pt Will Perform Toileting - Clothing Manipulation and hygiene: with mod assist;sit to/from stand Additional ADL Goal #1: Pt will perform bed mobility prior to engaging in ADL activity at mod A with HOB elevated  OT Frequency: Min 2X/week   Barriers to D/C:    Pt's wife is  unable to assist with transfers due to Pt size       Co-evaluation PT/OT/SLP  Co-Evaluation/Treatment: Yes Reason for Co-Treatment: Complexity of the patient's impairments (multi-system involvement);Necessary to address cognition/behavior during functional activity;For patient/therapist safety;To address functional/ADL transfers PT goals addressed during session: Mobility/safety with mobility;Balance;Proper use of DME;Strengthening/ROM OT goals addressed during session: ADL's and self-care;Proper use of Adaptive equipment and DME;Strengthening/ROM      AM-PAC PT "6 Clicks" Daily Activity     Outcome Measure Help from another person eating meals?: A Lot Help from another person taking care of personal grooming?: A Lot Help from another person toileting, which includes using toliet, bedpan, or urinal?: Total Help from another person bathing (including washing, rinsing, drying)?: A Lot Help from another person to put on and taking off regular upper body clothing?: A Lot Help from another person to put on and taking off regular lower body clothing?: A Lot 6 Click Score: 11   End of Session Equipment Utilized During Treatment: Gait belt;Other (comment)(stedy) Nurse Communication: Mobility status;Need for lift equipment;Precautions;Weight bearing status  Activity Tolerance: Patient limited by fatigue;Patient limited by pain Patient left: in chair;with call bell/phone within reach;with chair alarm set;with family/visitor present;Other (comment)(lift pad placed beneath Pt)  OT Visit Diagnosis: Unsteadiness on feet (R26.81);Other abnormalities of gait and mobility (R26.89);History of falling (Z91.81);Other symptoms and signs involving cognitive function;Pain Pain - Right/Left: Right Pain - part of body: Shoulder                Time: 1657-9038 OT Time Calculation (min): 53 min Charges:  OT General Charges $OT Visit: 1 Visit OT Evaluation $OT Eval Moderate Complexity: 1 Mod OT Treatments $Self Care/Home Management : 8-22 mins  Hulda Humphrey OTR/L Acute Rehabilitation  Services Pager: [phone] Office: [place] 03/11/2018, 4:25 PM

## 2018-03-11 NOTE — Progress Notes (Signed)
Progress Note    Carlos Hanson  OBS:[id number] DOB: 1940-09-02  DOA: 03/09/2018 PCP: Leanna Battles, MD    Brief Narrative:    77 y.o. male resdient [place] living with wife Past medical history of OSA, CAD, HLD, HTN, chronic back pain on Narcotics Morphine 30 bid/Oxycodone 10, recent fall with humerus fracture. Patient presents with complaints of confusion ongoing for last few days.  Had a fall 5 days ago and was seen in an OSH ED at which time he was told that he had a rotator cuff injury and patient was placed in a sling and was recommended oral narcotics. Patient has been taking increased dose of Percocets and has become more confused over last few days.  Became agitated and therefore was brought to the hospital.   Assessment/Plan:   Active Problems:   Altered mental status  Acute encephalopathy Underlying dementia Dementia and sundowning + of oral narcotics. -slightly improved-- wife reports slow delcine over 3 ys but physically high functioning - UA unremarkable.  No evidence of active infection. -r/o urinary retention/fecal impaction -PT/OT -ammonia/TSH normal -MRI brain: Moderate chronic microvascular ischemic changes and moderate to severe volume loss of the brain. - have schedule tylenol, meds adjusted--Cut back Gabapentin to 100 daily, cut Morphine to 15 bid, on Oxy 5 now  H/o alcohol use -? hasnt drunk in a year?--stop CIWA  Anemia- unclear etiology -trend -further work up if needed in hospital  Impacted Right humerus fracture. -ER spoke with Dr. Erlinda Hong, plan is for sling and outpatient follow up -will need coordination as OP from SNF   OSA. Continue CPAP -refused CPAP on admit-emphasized to patient and him to use while napping  Chronic back pain. Patient is on multiple medication gabapentin and Cymbalta as well as scheduled morphine-- was being given IR but actually on ER meds cut back as above  Essential hypertension with Paroxysmal A.  fib -Cardizem - -amiodarone as well as continue Xarelto (xarelto will need to be discussed with PCP as patient appears to have multiple falls over last few years  obesity Body mass index is 40.69 kg/m.   Ran board of pharmacy:  03/04/2018  2   03/02/2018  Morphine Sulf Er 30 Mg Tablet  60.00 30 Da [place] Din  [id number]  [place] ([id number])  0/0 60.00 MME Medicare  [place]  02/18/2018  2   02/15/2018  Oxycodone-Acetaminophen 10-325  90.00 30 Da [place] Din  [id number]  [place] ([id number])  0/0 45.00 MME Private Pay  [place]  02/16/2018  1   11/19/2017  Zolpidem Tartrate 10 Mg Tablet  30.00 30 Da Fraser Din  [id number]  [place] ([id number])  3/3 0.50 LME Medicare  [place]  01/27/2018  1   01/26/2018  Morphine Sulf Er 30 Mg Tablet  60.00 30 Wi Sha  [id number]  [place] ([id number])  0/0 60.00 MME Medicare  [place]  01/17/2018  1   11/19/2017  Zolpidem Tartrate 10 Mg Tablet  30.00 30 Da [place] Din  [id number]  [place] ([id number])  2/3 0.50 LME Medicare  [place]  12/30/2017  1   12/28/2017  Morphine Sulf Er 30 Mg Tablet  60.00 30 Da Fraser Din  [id number]  [place] ([id number])  0/0 60.00 MME Medicare  [place]  12/17/2017  1   11/19/2017  Zolpidem Tartrate 10 Mg Tablet  30.00 30 Da Fraser Din  [id number]  [place] ([id number])  1/3 0.50 LME Medicare    11/29/2017  1   11/29/2017  Morphine Sulf Er 30 Mg Tablet  60.00 30 Da Fraser Din  [id number]  [place] (5628)  0/0 60.00 MME Medicare  [place]  11/19/2017  1   11/19/2017  Zolpidem Tartrate 10 Mg Tablet  30.00 30 Da Fraser Din  [id number]  [place] (5628)  0/3        Family Communication/Anticipated D/C date and plan/Code Status   DVT prophylaxis: xarelto Code Status: Full Code.  Family Communication: none at bedside Disposition Plan: pending-- still not at baseline clinically-- need further work up and monitoring   Medical Consultants:    None.    Subjective:   Awakens some coherence but cannot correclty state president nor year-can recall seasion and oplace No overt pain Becomes sleepy  Objective:    Vitals:   03/10/18 1615 03/10/18 1617 03/10/18 2107 03/11/18 1111  BP: 130/68 126/78 139/73 139/73  Pulse:  77 74 74   Resp:  20 18   Temp:   98.6 F (37 C)   TempSrc:   Oral   SpO2: 93% 94% 98%   Weight:      Height:        Intake/Output Summary (Last 24 hours) at 03/11/2018 1412 Last data filed at 03/11/2018 1113 Gross per 24 hour  Intake 493 ml  Output 450 ml  Net 43 ml   Filed Weights   03/09/18 1119  Weight: 136.1 kg    Exam: In chair. Left arm in side table drawer Will awaken but quickly falls asleep +BS, abdomen obese, NT +LE edema No increased work of breathing, protecting airway Not following commands  Data Reviewed:   I have personally reviewed following labs and imaging studies:  Labs: Labs show the following:   Basic Metabolic Panel: Recent Labs  Lab 03/09/18 1155 03/10/18 0823 03/11/18 0126  NA 132* 136 134*  K 4.1 3.9 3.8  CL 100 101 100  CO2 21* 26 23  GLUCOSE 110* 107* 112*  BUN 30* 23 22  CREATININE 1.32* 1.24 1.19  CALCIUM 8.8* 8.9 8.8*   GFR Estimated Creatinine Clearance: 73.1 mL/min (by C-G formula based on SCr of 1.19 mg/dL). Liver Function Tests: Recent Labs  Lab 03/09/18 1155 03/10/18 0823  AST 20 25  ALT 22 22  ALKPHOS 53 51  BILITOT 1.0 1.0  PROT 6.2* 6.2*  ALBUMIN 3.1* 3.0*   No results for input(s): LIPASE, AMYLASE in the last 168 hours. Recent Labs  Lab 03/09/18 1155  AMMONIA 15   Coagulation profile No results for input(s): INR, PROTIME in the last 168 hours.  CBC: Recent Labs  Lab 03/09/18 1155 03/10/18 0823 03/11/18 0126  WBC 10.7* 10.1 11.0*  NEUTROABS 7.5  --   --   HGB 11.3* 10.5* 10.5*  HCT 34.9* 33.1* 31.9*  MCV 93.3 92.7 90.6  PLT 369 401* 380   Cardiac Enzymes: Recent Labs  Lab 03/09/18 1155  TROPONINI <0.03   BNP (last 3 results) No results for input(s): PROBNP in the last 8760 hours. CBG: No results for input(s): GLUCAP in the last 168 hours. D-Dimer: No results for input(s): DDIMER in the last 72 hours. Hgb A1c: No results for input(s): HGBA1C in the last 72 hours. Lipid  Profile: No results for input(s): CHOL, HDL, LDLCALC, TRIG, CHOLHDL, LDLDIRECT in the last 72 hours. Thyroid function studies: Recent Labs    03/10/18 0823  TSH 3.880   Anemia work up: No results for input(s): VITAMINB12, FOLATE, FERRITIN, TIBC, IRON, RETICCTPCT in the last 72 hours. Sepsis Labs: Recent Labs  Lab 03/09/18 1155 03/10/18 0823 03/11/18 0126  WBC 10.7* 10.1 11.0*  Microbiology Recent Results (from the past 240 hour(s))  Urine culture     Status: None   Collection Time: 03/09/18  2:50 PM  Result Value Ref Range Status   Specimen Description URINE, CLEAN CATCH  Final   Special Requests Normal  Final   Culture   Final    NO GROWTH Performed at [facility], [address] [place], [place] [address]    Report Status 03/10/2018 FINAL  Final    Procedures and diagnostic studies:  Mr Brain Wo Contrast  Result Date: 03/09/2018 CLINICAL DATA:  77 y/o M; fall 5 days prior. Decreased mobility and subsequent weakness. EXAM: MRI HEAD WITHOUT CONTRAST TECHNIQUE: Multiplanar, multiecho pulse sequences of the brain and surrounding structures were obtained without intravenous contrast. COMPARISON:  02/06/2018 CT head. FINDINGS: Brain: Motion degradation of several sequences. No acute infarction, hemorrhage, hydrocephalus, extra-axial collection or mass lesion. Scattered nonspecific T2 hyperintensities in subcortical and periventricular white matter are compatible with moderate chronic microvascular ischemic changes for age. Moderate to severe volume loss of the brain. Motion degraded T2 FLAIR sequence. Vascular: Normal flow voids. Skull and upper cervical spine: Normal marrow signal. Sinuses/Orbits: No acute finding. Small left maxillary sinus mucous retention cyst and mild mucosal thickening of the ethmoid air cells. Other: Bilateral intra-ocular lens replacement. IMPRESSION: 1. Motion degraded study. 2. No acute intracranial abnormality identified. 3. Moderate chronic  microvascular ischemic changes and moderate to severe volume loss of the brain. Electronically Signed   By: Kristine Garbe M.D.   On: 03/09/2018 23:00    Medications:   . acetaminophen  1,000 mg Oral Q8H  . amiodarone  100 mg Oral Daily  . desmopressin  0.2 mg Oral QHS  . diltiazem  120 mg Oral Daily  . DULoxetine  90 mg Oral Daily  . folic acid  1 mg Oral Daily  . gabapentin  100 mg Oral BID  . lactulose  20 g Oral BID  . morphine  15 mg Oral Q12H  . multivitamin with minerals  1 tablet Oral Daily  . pantoprazole  40 mg Oral BID  . polyethylene glycol  17 g Oral Daily  . Rivaroxaban  20 mg Oral Q supper  . rosuvastatin  20 mg Oral Daily  . sodium chloride flush  3 mL Intravenous Q12H  . thiamine  100 mg Oral Daily   Or  . thiamine  100 mg Intravenous Daily  . triamterene-hydrochlorothiazide  0.5 tablet Oral Q M,W,F   Continuous Infusions:   LOS: 1 day   Jai-Gurmukh Keyontae Huckeby  [facility]   03/11/2018, 2:12 PM

## 2018-03-11 NOTE — NC FL2 (Signed)
[place] LEVEL OF CARE SCREENING TOOL     IDENTIFICATION  Patient Name: Carlos Hanson Birthdate: 1940/07/24 Sex: male Admission Date (Current Location): 03/09/2018  [facility] and [place] Number:  Herbalist and Address:  [facility], [place] [address] [place], [place] [address]      Provider Number:    Attending Physician Name and Address:  Nita Sells, MD  Relative Name and Phone Number:  [name] (spouse) [phone]    Current Level of Care: Hospital Recommended Level of Care: [place] Prior Approval Number:    Date Approved/Denied: 03/11/18 PASRR Number: [id number]  Discharge Plan: SNF    Current Diagnoses: Patient Active Problem List   Diagnosis Date Noted  . Morbid obesity due to excess calories ([place]) 05/16/2015  . Chronic pain syndrome 05/16/2015  . OSA on CPAP 05/16/2015  . RLS (restless legs syndrome) 07/09/2014  . Paroxysmal atrial fibrillation ([place]) 07/09/2014  . Obesity hypoventilation syndrome ([place]) 07/09/2014  . OSA treated with BiPAP 07/09/2014  . Altered mental state 06/29/2014  . UTI (lower urinary tract infection) 06/29/2014    Class: Acute  . Back pain 06/29/2014    Class: Chronic  . Obstructive sleep apnea hypopnea, severe 06/29/2014    Class: Chronic  . Altered mental status 06/29/2014    Orientation RESPIRATION BLADDER Height & Weight     Self  Normal Incontinent Weight: 300 lb (136.1 kg) Height:  6' (182.9 cm)  BEHAVIORAL SYMPTOMS/MOOD NEUROLOGICAL BOWEL NUTRITION STATUS      Continent Diet(see discharge summary)  AMBULATORY STATUS COMMUNICATION OF NEEDS Skin   Extensive Assist Verbally Normal                       Personal Care Assistance Level of Assistance  Bathing, Feeding, Dressing, Total care Bathing Assistance: Maximum assistance Feeding assistance: Limited assistance Dressing Assistance: Maximum assistance Total Care Assistance: Maximum  assistance   Functional Limitations Info  Sight, Hearing, Speech Sight Info: Adequate Hearing Info: Adequate Speech Info: Adequate    SPECIAL CARE FACTORS FREQUENCY  PT (By licensed PT), OT (By licensed OT)     PT Frequency: min 5x weekly OT Frequency: min 3x weekly            Contractures Contractures Info: Not present    Additional Factors Info  Code Status, Allergies Code Status Info: full Allergies Info: Allergies:  Naprosyn Naproxen           Current Medications (03/11/2018):  This is the current hospital active medication list Current Facility-Administered Medications  Medication Dose Route Frequency Provider Last Rate Last Dose  . acetaminophen (TYLENOL) tablet 1,000 mg  1,000 mg Oral Q8H Vann, Jessica U, DO   1,000 mg at 03/11/18 1109  . amiodarone (PACERONE) tablet 100 mg  100 mg Oral Daily Lavina Hamman, MD   100 mg at 03/11/18 1110  . bisacodyl (DULCOLAX) suppository 10 mg  10 mg Rectal Daily PRN Eulogio Bear U, DO      . desmopressin (DDAVP) tablet 0.2 mg  0.2 mg Oral QHS Lavina Hamman, MD   0.2 mg at 03/10/18 2206  . diltiazem (CARDIZEM CD) 24 hr capsule 120 mg  120 mg Oral Daily Lavina Hamman, MD   120 mg at 03/11/18 1111  . DULoxetine (CYMBALTA) DR capsule 90 mg  90 mg Oral Daily Lavina Hamman, MD   90 mg at 03/11/18 1109  . folic acid (FOLVITE) tablet  1 mg  1 mg Oral Daily Eulogio Bear U, DO   1 mg at 03/11/18 1109  . gabapentin (NEURONTIN) capsule 100 mg  100 mg Oral BID Nita Sells, MD      . lactulose (CHRONULAC) 10 GM/15ML solution 20 g  20 g Oral BID Lavina Hamman, MD   20 g at 03/11/18 1108  . morphine (MS CONTIN) 12 hr tablet 15 mg  15 mg Oral Q12H Vann, Jessica U, DO   15 mg at 03/11/18 1111  . multivitamin with minerals tablet 1 tablet  1 tablet Oral Daily Eulogio Bear U, DO   1 tablet at 03/11/18 1110  . ondansetron (ZOFRAN) tablet 4 mg  4 mg Oral Q6H PRN Lavina Hamman, MD       Or  . ondansetron [facility]) injection 4 mg  4  mg Intravenous Q6H PRN Lavina Hamman, MD      . oxyCODONE (Oxy IR/ROXICODONE) immediate release tablet 5 mg  5 mg Oral TID PRN Lavina Hamman, MD   5 mg at 03/11/18 0738  . pantoprazole (PROTONIX) EC tablet 40 mg  40 mg Oral BID Lavina Hamman, MD   40 mg at 03/11/18 1110  . polyethylene glycol (MIRALAX / GLYCOLAX) packet 17 g  17 g Oral Daily Vann, Jessica U, DO   17 g at 03/11/18 1108  . rivaroxaban (XARELTO) tablet 20 mg  20 mg Oral Q supper Eulogio Bear U, DO   20 mg at 03/10/18 1728  . rosuvastatin (CRESTOR) tablet 20 mg  20 mg Oral Daily Lavina Hamman, MD   20 mg at 03/11/18 1111  . sodium chloride flush (NS) 0.9 % injection 3 mL  3 mL Intravenous Q12H Lavina Hamman, MD   3 mL at 03/11/18 1113  . thiamine (VITAMIN B-1) tablet 100 mg  100 mg Oral Daily Eulogio Bear U, DO   100 mg at 03/11/18 1110   Or  . thiamine (B-1) injection 100 mg  100 mg Intravenous Daily Vann, Jessica U, DO      . triamterene-hydrochlorothiazide (MAXZIDE-25) 37.5-25 MG per tablet 0.5 tablet  0.5 tablet Oral Q M,W,F Lavina Hamman, MD         Discharge Medications: Please see discharge summary for a list of discharge medications.  Relevant Imaging Results:  Relevant Lab Results:   Additional Information SSN: [id number]  Alberteen Sam, LCSW

## 2018-03-11 NOTE — Evaluation (Signed)
Physical Therapy Evaluation Patient Details Name: Carlos Hanson MRN: [id number] DOB: Apr 22, 1941 Today's Date: 03/11/2018   History of Present Illness  Pt is a 77 y.o. male who presents with confusion s/p admission for R humeral fx resulting from a fall. PMH of OSA, CAD, HLD, HTN, chronic back pain  Clinical Impression  PTA pt was modified independent with most ADLs, and his wife helped with IADL's including medication management. Wife says memory and LE weakness are present at baseline. Pt A&Ox2 with some confusion, but able to engage in conversation and follow simple commands to complete functional tasks. Pt MaxAx3 for physical assistance and management of equipment to complete transfers to [facility], and to chair via [place] lift. Pt fatigues easily and requires moderate cuing to maintain stance for transfers. Skilled therapy is needed to improve LE weakness, mobility, and return to PLOF. DC planning to SNF appropriate at this time, PT will continue to follow acutely.    Follow Up Recommendations SNF;Supervision/Assistance - 24 hour    Equipment Recommendations  Other (comment)(TBD at next venue)       Precautions / Restrictions Precautions Precautions: Shoulder Type of Shoulder Precautions: R humeral fx Shoulder Interventions: Shoulder sling/immobilizer;At all times Restrictions Weight Bearing Restrictions: Yes RUE Weight Bearing: Non weight bearing      Mobility  Bed Mobility Overal bed mobility: Needs Assistance Bed Mobility: Supine to Sit     Supine to sit: +2 for physical assistance;HOB elevated;Mod assist     General bed mobility comments: pt unable to turn to EOB without use of RUE, he was able to provide assist in trunk and through LE with difficulties moving RLE to EOB. Sits on EOB leaning left, weightbearing through LLE, keeping RLE off the ground.  Transfers Overall transfer level: Needs assistance Equipment used: 2 person hand held assist Transfers: Sit to/from Stand Sit  to Stand: +2 physical assistance;+2 safety/equipment;Max assist         General transfer comment: Pt is MaxAx3 for powerup, maintaining standing, and managing equipment to move from EOB to [facility] to chair. Pt completed 5 sit to stands with rest breaks in between. During sit to stands, bed was moved and [facility] placed underneath, then [facility] lift used to transfer pt to chair. Vc given to lift chest up, push through LE and straighten knees. Quick to fatigue with LE shaking and knee buckling. Knee block needed to maintain upright for transfers.       Balance Overall balance assessment: Needs assistance Sitting-balance support: Single extremity supported;Feet supported Sitting balance-Leahy Scale: Fair   Postural control: Left lateral lean Standing balance support: Bilateral upper extremity supported Standing balance-Leahy Scale: Poor                               Pertinent Vitals/Pain Pain Assessment: 0-10 Pain Score: 8  Pain Location: R shoulder, upper arm Pain Descriptors / Indicators: Sharp;Guarding;Grimacing;Moaning Pain Intervention(s): Limited activity within patient's tolerance;Monitored during session;Repositioned    Home Living Family/patient expects to be discharged to:: Private residence(ILF) Living Arrangements: Spouse/significant other Available Help at Discharge: Family Type of Home: Independent living facility         Home Equipment: Kasandra Knudsen - single point;Shower seat - built in;Grab bars - tub/shower;Hand held Tourist information centre manager - 4 wheels      Prior Function Level of Independence: Independent with assistive device(s)         Comments: uses a SPC; wife manages medicines; drives; some baseline  memory deficits but still able to perform ADLs independently.        Extremity/Trunk Assessment   Upper Extremity Assessment Upper Extremity Assessment: Defer to OT evaluation    Lower Extremity Assessment Lower Extremity Assessment: RLE deficits/detail;LLE  deficits/detail(MMT grossly assessed in seated. ) RLE Deficits / Details: MMT: Hip flexion 4-/5, knee extenion 4-/5 LLE Deficits / Details: MMT: Hip flexion 4-/5, Knee extension 3+/5    Cervical / Trunk Assessment Cervical / Trunk Assessment: Normal  Communication   Communication: No difficulties  Cognition Arousal/Alertness: Awake/alert Behavior During Therapy: WFL for tasks assessed/performed Overall Cognitive Status: Impaired/Different from baseline Area of Impairment: Orientation;Attention;Memory;Following commands;Awareness;Safety/judgement                 Orientation Level: Disoriented to;Place;Situation     Following Commands: Follows one step commands with increased time;Follows multi-step commands inconsistently Safety/Judgement: Decreased awareness of deficits;Decreased awareness of safety     General Comments: Wife says some memory deficits at baseline with medications and other IADLs; pt shows some confusion of location, deficits noted in sustained and divided attention      General Comments General comments (skin integrity, edema, etc.): Pt's wife present for duration of tx, discussed concerns with DC planning with both pt and wife. Wife says he had some LE weakness and memory deficits at baseline and his fall resulted from forgetting to use his SPC.          Assessment/Plan    PT Assessment Patient needs continued PT services  PT Problem List Decreased strength;Decreased range of motion;Decreased activity tolerance;Decreased balance;Decreased mobility;Decreased coordination;Decreased cognition;Decreased safety awareness;Pain       PT Treatment Interventions DME instruction;Gait training;Stair training;Functional mobility training;Therapeutic activities;Therapeutic exercise;Balance training;Neuromuscular re-education;Cognitive remediation    PT Goals (Current goals can be found in the Care Plan section)  Acute Rehab PT Goals Patient Stated Goal: go  home PT Goal Formulation: With patient Time For Goal Achievement: 03/25/18 Potential to Achieve Goals: Fair    Frequency Min 3X/week     Co-evaluation PT/OT/SLP Co-Evaluation/Treatment: Yes Reason for Co-Treatment: Complexity of the patient's impairments (multi-system involvement);Necessary to address cognition/behavior during functional activity;For patient/therapist safety;To address functional/ADL transfers PT goals addressed during session: Mobility/safety with mobility;Balance;Strengthening/ROM OT goals addressed during session: ADL's and self-care;Strengthening/ROM       AM-PAC PT "6 Clicks" Daily Activity  Outcome Measure Difficulty turning over in bed (including adjusting bedclothes, sheets and blankets)?: Unable Difficulty moving from lying on back to sitting on the side of the bed? : Unable Difficulty sitting down on and standing up from a chair with arms (e.g., wheelchair, bedside commode, etc,.)?: Unable Help needed moving to and from a bed to chair (including a wheelchair)?: Total Help needed walking in hospital room?: Total Help needed climbing 3-5 steps with a railing? : Total 6 Click Score: 6    End of Session Equipment Utilized During Treatment: Gait belt Activity Tolerance: Patient limited by fatigue;Patient limited by pain Patient left: in chair;with call bell/phone within reach;with chair alarm set;with family/visitor present;with nursing/sitter in room Nurse Communication: Mobility status;Need for lift equipment(spoke with nurse and left note on board) PT Visit Diagnosis: Unsteadiness on feet (R26.81);Muscle weakness (generalized) (M62.81);History of falling (Z91.81);Difficulty in walking, not elsewhere classified (R26.2);Pain Pain - Right/Left: Right Pain - part of body: Shoulder;Arm    Time: 1884-1660 PT Time Calculation (min) (ACUTE ONLY): 51 min   Charges:   PT Evaluation $PT Eval Moderate Complexity: 1 Mod          Vernell Morgans,  SPT Acute  Rehabilitation Services Office [phone]   Vernell Morgans 03/11/2018, 3:10 PM

## 2018-03-11 NOTE — Progress Notes (Signed)
Pt has been getting up out of the bed and the recliner throughout the night. All alarms have been on, however the staff is concerned that Carlos Hanson may fall. Telesitter ordered to provide closer observation of this patient.

## 2018-03-11 NOTE — Progress Notes (Signed)
RT placed patient on CPAP HS on auto 55max and 12min. NO O2 bleed in needed. Patient tolerating well.

## 2018-03-11 NOTE — Progress Notes (Signed)
Respiratory Therapy (Brandy) notified of the order/need for CPAP at bedtime and during napping .

## 2018-03-12 NOTE — Progress Notes (Signed)
Progress Note    Carlos Hanson  TKW:[id number] DOB: [birth year]  DOA: 03/09/2018 PCP: Leanna Battles, MD    Brief Narrative:    77 y.o. male resdient [facility] with wife Past medical history of OSA, CAD, HLD, HTN, chronic back pain on Narcotics Morphine 30 bid/Oxycodone 10, recent fall with humerus fracture. Patient presents with complaints of confusion ongoing for last few days.  Had a fall 5 days ago and was seen in an OSH ED at which time he was told that he had a rotator cuff injury and patient was placed in a sling and was recommended oral narcotics. Patient has been taking increased dose of Percocets and has become more confused over last few days.  Became agitated and therefore was brought to the hospital.   Assessment/Plan:   Active Problems:   Altered mental status  Acute encephalopathy Underlying dementia Dementia and sundowning + of oral narcotics. -slightly improved--wife reports slow decline over 3 ys but physically high functioning - UA unremarkable.  No evidence of active infection. -r/o urinary retention/fecal impaction -PT/OT -ammonia/TSH normal -MRI brain: Moderate chronic microvascular ischemic changes and moderate to severe volume loss of the brain. - have schedule tylenol, meds adjusted--Cut back Gabapentin to 100 daily, cut Morphine to 15 bid, on Oxy 5 now  H/o alcohol use -ni indication for CIWA/ativan  Anemia- unclear etiology -trend -further work up if needed in hospital  Impacted Right humerus fracture. -ER spoke with Dr. Erlinda Hong, plan is for sling and outpatient follow up -will need coordination as OP from SNF   OSA. Continue CPAP -is much improved on cpap and re-emphasized use of the same  Chronic back pain. Patient is on multiple medication gabapentin and Cymbalta as well as scheduled morphine-- was being given IR but actually on ER meds cut back as above  Essential hypertension with Paroxysmal A. fib -Cardizem  - -amiodarone as well as continue Xarelto (xarelto will need to be discussed with PCP as patient appears to have multiple falls over last few years  obesity Body mass index is 40.69 kg/m.   Ran board of pharmacy:  03/04/2018  2   03/02/2018  Morphine Sulf Er 30 Mg Tablet  60.00 30 Da Fraser Din  [phone]  [place] (1962)  0/0 60.00 MME Medicare  [place]  02/18/2018  2   02/15/2018  Oxycodone-Acetaminophen 10-325  90.00 30 Da Fraser Din  [id number]  [place] ([id number])  0/0 45.00 MME Private Pay  [place]  02/16/2018  1   11/19/2017  Zolpidem Tartrate 10 Mg Tablet  30.00 30 Da Fraser Din  [id number]  [place] ([id number])  3/3 0.50 LME Medicare  [place]  01/27/2018  1   01/26/2018  Morphine Sulf Er 30 Mg Tablet  60.00 30 Wi Sha  [id number]  [place] ([id number])  0/0 60.00 MME Medicare  [place]  01/17/2018  1   11/19/2017  Zolpidem Tartrate 10 Mg Tablet  30.00 30 Da Fraser Din  [id number]  [place] ([id number])  2/3 0.50 LME Medicare  [place]  12/30/2017  1   12/28/2017  Morphine Sulf Er 30 Mg Tablet  60.00 30 Da Fraser Din  [id number]  [place] ([id number])  0/0 60.00 MME Medicare    12/17/2017  1   11/19/2017  Zolpidem Tartrate 10 Mg Tablet  30.00 30 Da Fraser Din  [id number]  [place] ([id number])  1/3 0.50 LME Medicare    11/29/2017  1   11/29/2017  Morphine Sulf Er 30 Mg Tablet  60.00 30 Da Fraser Din  [id number]  [place] [phone]  0/0 60.00 MME Medicare  [place]  11/19/2017  1   11/19/2017  Zolpidem Tartrate 10 Mg Tablet  30.00 30 Da Fraser Din  [id number]  [place] ([id number])  0/3        Family Communication/Anticipated D/C date and plan/Code Status   DVT prophylaxis: xarelto Code Status: Full Code.  Family Communication: none at bedside Disposition Plan: pending-- still not at baseline clinically-- need further work up and monitoring   Medical Consultants:    None.  Subjective:   More coherent  Still minimal confusion No cp/f/n/v/cough/cold  Objective:    Vitals:   03/11/18 2235 03/12/18 0345 03/12/18 0901 03/12/18 1231  BP:  137/85 128/68 136/86  Pulse:  63 75 73  Resp:    20  Temp: 98.1 F (36.7 C) 98.3 F (36.8 C)  98 F (36.7 C)   TempSrc: Oral Oral  Oral  SpO2: 96% 98%  94%  Weight:      Height:        Intake/Output Summary (Last 24 hours) at 03/12/2018 1320 Last data filed at 03/12/2018 1314 Gross per 24 hour  Intake 480 ml  Output -  Net 480 ml   Filed Weights   03/09/18 1119  Weight: 136.1 kg    Exam: In chair. Left arm in side table drawer Awakens and sharper answers more readily and spontaneously +BS, abdomen obese, NT +LE edema No increased work of breathing, protecting airway Not following commands  Data Reviewed:   I have personally reviewed following labs and imaging studies:  Labs: Labs show the following:   Basic Metabolic Panel: Recent Labs  Lab 03/09/18 1155 03/10/18 0823 03/11/18 0126  NA 132* 136 134*  K 4.1 3.9 3.8  CL 100 101 100  CO2 21* 26 23  GLUCOSE 110* 107* 112*  BUN 30* 23 22  CREATININE 1.32* 1.24 1.19  CALCIUM 8.8* 8.9 8.8*   GFR Estimated Creatinine Clearance: 73.1 mL/min (by C-G formula based on SCr of 1.19 mg/dL). Liver Function Tests: Recent Labs  Lab 03/09/18 1155 03/10/18 0823  AST 20 25  ALT 22 22  ALKPHOS 53 51  BILITOT 1.0 1.0  PROT 6.2* 6.2*  ALBUMIN 3.1* 3.0*   No results for input(s): LIPASE, AMYLASE in the last 168 hours. Recent Labs  Lab 03/09/18 1155  AMMONIA 15   Coagulation profile No results for input(s): INR, PROTIME in the last 168 hours.  CBC: Recent Labs  Lab 03/09/18 1155 03/10/18 0823 03/11/18 0126  WBC 10.7* 10.1 11.0*  NEUTROABS 7.5  --   --   HGB 11.3* 10.5* 10.5*  HCT 34.9* 33.1* 31.9*  MCV 93.3 92.7 90.6  PLT 369 401* 380   Cardiac Enzymes: Recent Labs  Lab 03/09/18 1155  TROPONINI <0.03   BNP (last 3 results) No results for input(s): PROBNP in the last 8760 hours. CBG: No results for input(s): GLUCAP in the last 168 hours. D-Dimer: No results for input(s): DDIMER in the last 72 hours. Hgb A1c: No results for input(s): HGBA1C in the last 72 hours. Lipid Profile: No results for input(s):  CHOL, HDL, LDLCALC, TRIG, CHOLHDL, LDLDIRECT in the last 72 hours. Thyroid function studies: Recent Labs    03/10/18 0823  TSH 3.880   Anemia work up: No results for input(s): VITAMINB12, FOLATE, FERRITIN, TIBC, IRON, RETICCTPCT in the last 72 hours. Sepsis Labs: Recent Labs  Lab 03/09/18 1155 03/10/18 0823 03/11/18 0126  WBC 10.7* 10.1 11.0*    Microbiology Recent Results (from the past 240 hour(s))  Urine culture     Status: None   Collection Time: 03/09/18  2:50 PM  Result Value Ref Range Status   Specimen Description URINE, CLEAN CATCH  Final   Special Requests Normal  Final   Culture   Final    NO GROWTH Performed at [facility], [address] [place], [place] [address]    Report Status 03/10/2018 FINAL  Final    Procedures and diagnostic studies:  No results found.  Medications:   . acetaminophen  1,000 mg Oral Q8H  . amiodarone  100 mg Oral Daily  . desmopressin  0.2 mg Oral QHS  . diltiazem  120 mg Oral Daily  . DULoxetine  90 mg Oral Daily  . folic acid  1 mg Oral Daily  . gabapentin  100 mg Oral BID  . lactulose  20 g Oral BID  . morphine  15 mg Oral Q12H  . multivitamin with minerals  1 tablet Oral Daily  . pantoprazole  40 mg Oral BID  . Rivaroxaban  20 mg Oral Q supper  . rosuvastatin  20 mg Oral Daily  . sodium chloride flush  3 mL Intravenous Q12H  . thiamine  100 mg Oral Daily   Or  . thiamine  100 mg Intravenous Daily  . triamterene-hydrochlorothiazide  0.5 tablet Oral Q M,W,F   Continuous Infusions:   LOS: 2 days   Jai-Gurmukh Natarsha Hurwitz  [facility]   03/12/2018, 1:20 PM

## 2018-03-12 NOTE — Progress Notes (Signed)
CSW received insurance authorization, if medically stable patient can d/c to [facility] tomorrow 11/10.   Emma, [place]

## 2018-03-12 NOTE — Plan of Care (Signed)
Problem: Education: Goal: Knowledge of General Education information will improve Description Including pain rating scale, medication(s)/side effects and non-pharmacologic comfort measures Outcome: Progressing   Problem: Health Behavior/Discharge Planning: Goal: Ability to manage health-related needs will improve Outcome: Progressing   Problem: Clinical Measurements: Goal: Ability to maintain clinical measurements within normal limits will improve Outcome: Progressing Goal: Respiratory complications will improve Outcome: Progressing   Problem: Coping: Goal: Level of anxiety will decrease Outcome: Progressing   Problem: Safety: Goal: Ability to remain free from injury will improve Outcome: Progressing

## 2018-03-13 DIAGNOSIS — S42301D Unspecified fracture of shaft of humerus, right arm, subsequent encounter for fracture with routine healing: Secondary | ICD-10-CM | POA: Diagnosis not present

## 2018-03-13 DIAGNOSIS — Z7401 Bed confinement status: Secondary | ICD-10-CM | POA: Diagnosis not present

## 2018-03-13 DIAGNOSIS — R4182 Altered mental status, unspecified: Secondary | ICD-10-CM | POA: Diagnosis not present

## 2018-03-13 DIAGNOSIS — S42211D Unspecified displaced fracture of surgical neck of right humerus, subsequent encounter for fracture with routine healing: Secondary | ICD-10-CM | POA: Diagnosis not present

## 2018-03-13 DIAGNOSIS — G9340 Encephalopathy, unspecified: Secondary | ICD-10-CM | POA: Diagnosis not present

## 2018-03-13 DIAGNOSIS — M2550 Pain in unspecified joint: Secondary | ICD-10-CM | POA: Diagnosis not present

## 2018-03-13 DIAGNOSIS — R41841 Cognitive communication deficit: Secondary | ICD-10-CM | POA: Diagnosis not present

## 2018-03-13 DIAGNOSIS — M5136 Other intervertebral disc degeneration, lumbar region: Secondary | ICD-10-CM | POA: Diagnosis not present

## 2018-03-13 DIAGNOSIS — R410 Disorientation, unspecified: Secondary | ICD-10-CM | POA: Diagnosis not present

## 2018-03-13 DIAGNOSIS — Z9181 History of falling: Secondary | ICD-10-CM | POA: Diagnosis not present

## 2018-03-13 DIAGNOSIS — R404 Transient alteration of awareness: Secondary | ICD-10-CM | POA: Diagnosis not present

## 2018-03-13 DIAGNOSIS — R278 Other lack of coordination: Secondary | ICD-10-CM | POA: Diagnosis not present

## 2018-03-13 DIAGNOSIS — D649 Anemia, unspecified: Secondary | ICD-10-CM | POA: Diagnosis not present

## 2018-03-13 DIAGNOSIS — M25511 Pain in right shoulder: Secondary | ICD-10-CM | POA: Diagnosis not present

## 2018-03-13 DIAGNOSIS — R2681 Unsteadiness on feet: Secondary | ICD-10-CM | POA: Diagnosis not present

## 2018-03-13 DIAGNOSIS — M6281 Muscle weakness (generalized): Secondary | ICD-10-CM | POA: Diagnosis not present

## 2018-03-13 MED ORDER — OXYCODONE HCL 5 MG PO TABS: 5.0000 mg | ORAL_TABLET | Freq: Three times a day (TID) | ORAL | 0 refills | Status: DC | PRN

## 2018-03-13 MED ORDER — ACETAMINOPHEN 500 MG PO TABS: 1000.0000 mg | ORAL_TABLET | Freq: Three times a day (TID) | ORAL | 0 refills | Status: DC

## 2018-03-13 MED ORDER — GABAPENTIN 100 MG PO CAPS: 100.0000 mg | ORAL_CAPSULE | Freq: Two times a day (BID) | ORAL | 0 refills | Status: DC

## 2018-03-13 MED ORDER — LACTULOSE 10 GM/15ML PO SOLN: 20.0000 g | Freq: Every day | ORAL | 1 refills | Status: DC

## 2018-03-13 MED ORDER — DULOXETINE HCL 30 MG PO CPEP: 90.0000 mg | ORAL_CAPSULE | Freq: Every day | ORAL | 0 refills | Status: DC

## 2018-03-13 MED ORDER — MORPHINE SULFATE ER 15 MG PO TBCR: 15.0000 mg | EXTENDED_RELEASE_TABLET | Freq: Two times a day (BID) | ORAL | 0 refills | Status: DC

## 2018-03-13 NOTE — Clinical Social Work Placement (Signed)
   CLINICAL SOCIAL WORK PLACEMENT  NOTE  Date:  03/13/2018  Patient Details  Name: Carlos Hanson MRN: [id number] Date of Birth: Feb 02, 1941  Clinical Social Work is seeking post-discharge placement for this patient at the   level of care (*CSW will initial, date and re-position this form in  chart as items are completed):  Yes   Patient/family provided with [place] Work Department's list of facilities offering this level of care within the geographic area requested by the patient (or if unable, by the patient's family).  Yes   Patient/family informed of their freedom to choose among providers that offer the needed level of care, that participate in Medicare, Medicaid or managed care program needed by the patient, have an available bed and are willing to accept the patient.      Patient/family informed of 's ownership interest in [facility] and [facility], as well as of the fact that they are under no obligation to receive care at these facilities.  PASRR submitted to EDS on       PASRR number received on 03/11/18     Existing PASRR number confirmed on       FL2 transmitted to all facilities in geographic area requested by pt/family on 03/11/18     FL2 transmitted to all facilities within larger geographic area on       Patient informed that his/her managed care company has contracts with or will negotiate with certain facilities, including the following:        Yes   Patient/family informed of bed offers received.  Patient chooses bed at [facility]     Physician recommends and patient chooses bed at      Patient to be transferred to [facility] on 03/13/18.  Patient to be transferred to facility by [facility]     Patient family notified on 03/13/18 of transfer.  Name of family member notified:  [name]     PHYSICIAN       Additional Comment:    _______________________________________________ Alberteen Sam,  LCSW 03/13/2018, 9:48 AM

## 2018-03-13 NOTE — Progress Notes (Signed)
AVS, printed prescriptions, and social worker's paperwork placed in discharge packet. Report called and given to [place], Therapist, sports at [employer]. All questions answered to satisfaction. [facility] transported pt via stretcher.

## 2018-03-13 NOTE — Plan of Care (Signed)

## 2018-03-13 NOTE — Discharge Summary (Signed)
Physician Discharge Summary  Carlos Hanson [id number]:[id number] DOB: 1940/09/10 DOA: 03/09/2018  PCP: Leanna Battles, MD  Admit date: 03/09/2018 Discharge date: 03/13/2018  Time spent: 35 minutes  Recommendations for Outpatient Follow-up:  1. Please note dosage changes on MAR for opiates and Neurontin as we have dropped the doses of the same secondary to metabolic encephalopathy and probably should not titrate back up 2. Please ensure that he has follow-up with Dr. Erlinda Hong of [facility] orthopedics within 7 to 10 days for right shoulder impacted fracture that may require either repair or prolonged splinting 3. Would recommend patient continue all other meds-dosage changes were made in addition to lactulose 4. Get LFTs and TSH in about 3 to 4 weeks as patient is on chronic amiodarone 5. Need to rehab level care as does need significant assistance with ADLs and can be up to his potential once pain is better controlled and he is followed by orthopedics eventually  Discharge Diagnoses:  Active Problems:   Altered mental status   Discharge Condition: Improved  Diet recommendation: Heart healthy  Filed Weights   03/09/18 1119  Weight: 136.1 kg    History of present illness:  77 y.o. male resdient [facility] with wife Past medical history ofOSA, CAD, HLD, HTN, chronic back pain on Narcotics Morphine 30 bid/Oxycodone 10, recent fall with humerus fracture. Patient presents with complaints of confusion ongoing for last few days. Follow-up for 5 days prior to admission to OSH ED at which time he was told that he had a rotator cuff injury  Patient has been taking increased dose of Percocets and has become more confused over last few days.  Became agitated and therefore was brought to the hospital.   Hospital Course:  Acute encephalopathy Underlying dementia - UA unremarkable. No evidence of active infection. -r/o urinary retention/fecal impaction-MRI brain: Moderate chronic  microvascular ischemic changes and moderate to severe volume loss of the brain-ammonia/TSH normal Dementia and sundowning + of oral narcotics. -Patient did have significant improvement on BiPAP and cutting back doses of gabapentin, opiates, Ativan  - have schedule tylenol, meds adjusted--Cut back Gabapentin to 100 daily, cut Morphine to 15 bid, on Oxy 5 now  H/o alcohol use -n0 indication for CIWA/ativan this was discontinued and mentation and  Anemia- unclear etiology -trend -further work up if needed in hospital  Impacted Right humerus fracture. -ER spoke with Dr. Erlinda Hong, plan is for sling and outpatient follow up -will need coordination as OP from SNF-family is aware of the same  OSA. Continue CPAP -is much improved on cpap and re-emphasized use of the same-had been refusing recent  Chronic back pain. Patient is on multiple medication gabapentin and Cymbalta as well as scheduled morphine-- was being given IR but actually on ER meds cut back as above  Essential hypertension with Paroxysmal A. fib -Cardizem  -amiodarone as well as continue Xarelto (xarelto will need to be discussed with PCP as patient appears to have multiple falls over last few years  obesity Body mass index is 40.69 kg/m.  Procedures:  None  Consultations:  Orthopedics with telephone consult recommended outpatient follow-up as above  Discharge Exam: Vitals:   03/13/18 0406 03/13/18 0842  BP: 129/76 (!) 119/57  Pulse: 75   Resp:  18  Temp: 97.6 F (36.4 C) 98.6 F (37 C)  SpO2: 95% 96%    General: Much more awake alert in no distress EOMI NCAT no icterus no pallor thick neck Cardiovascular: S1-S2 no murmur rub or gallop no  PMI displacement Respiratory: Clinically clear no added sound Right shoulder is limited in terms of mobility secondary to pain power is however overall 5/5 Lower extremities power is normal sensory is intact  Discharge Instructions   Discharge Instructions     Diet - low sodium heart healthy   Complete by:  As directed    Increase activity slowly   Complete by:  As directed      Allergies as of 03/13/2018      Reactions   Naprosyn [naproxen] Rash      Medication List    STOP taking these medications   atorvastatin 20 MG tablet Commonly known as:  LIPITOR   oxyCODONE-acetaminophen 10-325 MG tablet Commonly known as:  PERCOCET   zolpidem 10 MG tablet Commonly known as:  AMBIEN     TAKE these medications   acetaminophen 500 MG tablet Commonly known as:  TYLENOL Take 2 tablets (1,000 mg total) by mouth every 8 (eight) hours.   amiodarone 100 MG tablet Commonly known as:  PACERONE Take 100 mg by mouth daily.   clotrimazole 1 % cream Commonly known as:  LOTRIMIN Apply topically 2 (two) times daily.   desmopressin 0.2 MG tablet Commonly known as:  DDAVP Take 0.2 mg by mouth at bedtime.   diltiazem 120 MG 24 hr capsule Commonly known as:  DILACOR XR Take 120 mg by mouth daily.   DULoxetine 30 MG capsule Commonly known as:  CYMBALTA Take 3 capsules (90 mg total) by mouth daily.   gabapentin 100 MG capsule Commonly known as:  NEURONTIN Take 1 capsule (100 mg total) by mouth 2 (two) times daily. What changed:    medication strength  how much to take   lactulose 10 GM/15ML solution Commonly known as:  CHRONULAC Take 30 mLs (20 g total) by mouth daily. What changed:  how much to take   morphine 15 MG 12 hr tablet Commonly known as:  MS CONTIN Take 1 tablet (15 mg total) by mouth every 12 (twelve) hours. What changed:    medication strength  how much to take   oxyCODONE 5 MG immediate release tablet Commonly known as:  Oxy IR/ROXICODONE Take 1 tablet (5 mg total) by mouth 3 (three) times daily as needed for moderate pain or severe pain.   oxymetazoline 0.05 % nasal spray Commonly known as:  AFRIN Place 1 spray into both nostrils at bedtime as needed for congestion (sinuses).   pantoprazole 40 MG  tablet Commonly known as:  PROTONIX Take 40 mg by mouth 2 (two) times daily.   rosuvastatin 20 MG tablet Commonly known as:  CRESTOR Take 20 mg by mouth daily.   triamterene-hydrochlorothiazide 37.5-25 MG tablet Commonly known as:  MAXZIDE-25 Take 0.5 tablets by mouth every Monday, Wednesday, and Friday.   VISION [employer] HEALTH Caps Take 1 tablet by mouth 2 (two) times daily. [employer]   XARELTO 15 MG Tabs tablet Generic drug:  Rivaroxaban Take 15 mg by mouth at bedtime.      Allergies  Allergen Reactions  . Naprosyn [Naproxen] Rash   Contact information for after-discharge care    Destination    [facility] .   Service:  Skilled Nursing Contact information: [address] [place] [phone]               The results of significant diagnostics from this hospitalization (including imaging, microbiology, ancillary and laboratory) are listed below for reference.  Significant Diagnostic Studies: Dg Chest 2 View  Result Date: 03/09/2018 CLINICAL DATA:  Altered mental status EXAM: CHEST - 2 VIEW COMPARISON:  05/01/2016 FINDINGS: The heart size and mediastinal contours are within normal limits. Both lungs are clear. The visualized skeletal structures are unremarkable. Left shoulder replacement. IMPRESSION: No active cardiopulmonary disease. Electronically Signed   By: Franchot Gallo M.D.   On: 03/09/2018 12:32   Dg Shoulder Right  Result Date: 03/09/2018 CLINICAL DATA:  Fall EXAM: RIGHT SHOULDER - 2+ VIEW COMPARISON:  03/04/2018 FINDINGS: Impacted fracture right humeral neck with mild angulation. Mild widening of the shoulder joint may represent effusion. No fracture of the clavicle or scapula. IMPRESSION: Impacted fracture right humeral neck Electronically Signed   By: Franchot Gallo M.D.   On: 03/09/2018 12:34   Ct Head Wo Contrast  Result Date: 03/09/2018 CLINICAL DATA:  Fall 5  days ago. Decreased mobility and subsequent weakness. History of hypertension, hypercholesterolemia. EXAM: CT HEAD WITHOUT CONTRAST TECHNIQUE: Contiguous axial images were obtained from the base of the skull through the vertex without intravenous contrast. COMPARISON:  CT HEAD November 14, 2016 FINDINGS: BRAIN: No intraparenchymal hemorrhage, mass effect nor midline shift. Moderate to severe supratentorial parenchymal brain volume loss with somewhat disproportionate mid brain atrophy. No hydrocephalus. Patchy supratentorial white matter hypodensities. No acute large vascular territory infarcts. No abnormal extra-axial fluid collections. Basal cisterns are patent. VASCULAR: Moderate calcific atherosclerosis of the carotid siphons. SKULL: No skull fracture. No significant scalp soft tissue swelling. SINUSES/ORBITS: Moderate paranasal sinus mucosal thickening. Mastoid air cells are well aerated.The included ocular globes and orbital contents are non-suspicious. Status post bilateral ocular lens implants. OTHER: None. IMPRESSION: 1. No acute intracranial process. 2. Stable moderate to severe parenchymal brain volume loss and moderate chronic small vessel ischemic changes. Electronically Signed   By: Elon Alas M.D.   On: 03/09/2018 13:30   Mr Brain Wo Contrast  Result Date: 03/09/2018 CLINICAL DATA:  77 y/o M; fall 5 days prior. Decreased mobility and subsequent weakness. EXAM: MRI HEAD WITHOUT CONTRAST TECHNIQUE: Multiplanar, multiecho pulse sequences of the brain and surrounding structures were obtained without intravenous contrast. COMPARISON:  02/06/2018 CT head. FINDINGS: Brain: Motion degradation of several sequences. No acute infarction, hemorrhage, hydrocephalus, extra-axial collection or mass lesion. Scattered nonspecific T2 hyperintensities in subcortical and periventricular white matter are compatible with moderate chronic microvascular ischemic changes for age. Moderate to severe volume loss of the  brain. Motion degraded T2 FLAIR sequence. Vascular: Normal flow voids. Skull and upper cervical spine: Normal marrow signal. Sinuses/Orbits: No acute finding. Small left maxillary sinus mucous retention cyst and mild mucosal thickening of the ethmoid air cells. Other: Bilateral intra-ocular lens replacement. IMPRESSION: 1. Motion degraded study. 2. No acute intracranial abnormality identified. 3. Moderate chronic microvascular ischemic changes and moderate to severe volume loss of the brain. Electronically Signed   By: Kristine Garbe M.D.   On: 03/09/2018 23:00    Microbiology: Recent Results (from the past 240 hour(s))  Urine culture     Status: None   Collection Time: 03/09/18  2:50 PM  Result Value Ref Range Status   Specimen Description URINE, CLEAN CATCH  Final   Special Requests Normal  Final   Culture   Final    NO GROWTH Performed at [facility], [address] [place], [place] [address]    Report Status 03/10/2018 FINAL  Final     Labs: Basic Metabolic Panel: Recent Labs  Lab 03/09/18 1155 03/10/18 0823 03/11/18 0126  NA 132* 136 134*  K 4.1 3.9 3.8  CL 100 101 100  CO2 21* 26 23  GLUCOSE 110* 107* 112*  BUN 30* 23 22  CREATININE 1.32* 1.24 1.19  CALCIUM 8.8* 8.9 8.8*   Liver Function Tests: Recent Labs  Lab 03/09/18 1155 03/10/18 0823  AST 20 25  ALT 22 22  ALKPHOS 53 51  BILITOT 1.0 1.0  PROT 6.2* 6.2*  ALBUMIN 3.1* 3.0*   No results for input(s): LIPASE, AMYLASE in the last 168 hours. Recent Labs  Lab 03/09/18 1155  AMMONIA 15   CBC: Recent Labs  Lab 03/09/18 1155 03/10/18 0823 03/11/18 0126  WBC 10.7* 10.1 11.0*  NEUTROABS 7.5  --   --   HGB 11.3* 10.5* 10.5*  HCT 34.9* 33.1* 31.9*  MCV 93.3 92.7 90.6  PLT 369 401* 380   Cardiac Enzymes: Recent Labs  Lab 03/09/18 1155  TROPONINI <0.03   BNP: BNP (last 3 results) No results for input(s): BNP in the last 8760 hours.  ProBNP (last 3 results) No results for  input(s): PROBNP in the last 8760 hours.  CBG: No results for input(s): GLUCAP in the last 168 hours.     Signed:  Nita Sells MD   [facility] 03/13/2018, 8:56 AM

## 2018-03-13 NOTE — Progress Notes (Signed)
Patient will DC to: [facility] Anticipated DC date: 03/13/18 Family notified: [name] Transport by: Corey Harold  Per MD patient ready for DC to [facility] . RN, patient, patient's family, and facility notified of DC. Discharge Summary sent to facility. RN given number for report [phone]. DC packet on chart. Ambulance transport requested for patient.  CSW signing off.  [place], [place]

## 2018-03-14 NOTE — Consult Note (Signed)
            [facility] CM Primary Care Navigator  03/14/2018  CLEVE PAOLILLO 1940-07-02 [id number]   Attempt to see patientat the bedsideto identify possible discharge needs buthe wasalreadydischarge over the weekend.  According to Inpatient social worker note, patientwas discharged to skilled nursing facility per therapy recommendation (SNF- [facility]).  Per chart review, patient presented with complaints of ongoing confusion. Patient had been taking increased dose of Percocets related to a rotator cuff injury and has become more confused, became agitated and therefore was brought to the hospital which had led to this admission. (acute encephalopathy with underlying dementia)  Patient has discharge instruction to follow-up with Dr. Erlinda Hong of [facility] in 7- 10 days (appointment  in 3 days) for right shoulder impacted fracture that may require either repair or prolonged splinting.  Per discharge summary, Xarelto will need to be discussed with PCP-primary care provider as patient appears to have multiple falls over last few years.  Primary care provider's office is listed as providing transition of care (TOC) follow-up.    For additional questions please contact:  Edwena Felty A. Bascom Biel, BSN, RN-BC [facility] PRIMARY CARE Navigator Cell: [phone]

## 2018-03-16 DIAGNOSIS — S42301D Unspecified fracture of shaft of humerus, right arm, subsequent encounter for fracture with routine healing: Secondary | ICD-10-CM | POA: Diagnosis not present

## 2018-03-16 DIAGNOSIS — M5136 Other intervertebral disc degeneration, lumbar region: Secondary | ICD-10-CM | POA: Diagnosis not present

## 2018-03-16 DIAGNOSIS — G9340 Encephalopathy, unspecified: Secondary | ICD-10-CM | POA: Diagnosis not present

## 2018-03-16 DIAGNOSIS — D649 Anemia, unspecified: Secondary | ICD-10-CM | POA: Diagnosis not present

## 2018-03-17 ENCOUNTER — Ambulatory Visit (INDEPENDENT_AMBULATORY_CARE_PROVIDER_SITE_OTHER): Payer: PPO | Admitting: Orthopaedic Surgery

## 2018-03-17 ENCOUNTER — Ambulatory Visit (INDEPENDENT_AMBULATORY_CARE_PROVIDER_SITE_OTHER): Payer: PPO

## 2018-03-17 DIAGNOSIS — M25511 Pain in right shoulder: Secondary | ICD-10-CM

## 2018-03-17 NOTE — Progress Notes (Signed)
Office Visit Note   Patient: Carlos Hanson           Date of Birth: 27-Apr-1941           MRN: [id number] Visit Date: 03/17/2018              Requested by: Leanna Battles, MD [address] [place], [place] [address] PCP: Leanna Battles, MD   Assessment & Plan: Visit Diagnoses:  1. Acute pain of right shoulder     Plan: Impression is a varus angulated right proximal humerus fracture.  We will need close follow-up in 2 weeks for repeat AP and scapular Y x-rays of the right shoulder.  Patient understands that there is a potential for nonhealing and AVN possible need for shoulder arthroplasty if this does not heal.  I have written down instructions for the occupational therapist at his facility.  Follow-Up Instructions: Return in about 2 weeks (around 03/31/2018).   Orders:  Orders Placed This Encounter  Procedures  . XR Shoulder Right   No orders of the defined types were placed in this encounter.     Procedures: No procedures performed   Clinical Data: No additional findings.   Subjective: Chief Complaint  Patient presents with  . Right Shoulder - Pain    Carlos Hanson is a 77 year old gentleman who sustained a right proximal humerus fracture 2 weeks ago from a mechanical fall.  He is currently at a SNF.  He is wearing a sling and takes oxycodone as needed.  He presents today with his wife.  Denies any numbness and tingling.  The pain is worse with movement of the arm.   Review of Systems  Constitutional: Negative.   All other systems reviewed and are negative.    Objective: Vital Signs: There were no vitals taken for this visit.  Physical Exam  Constitutional: He is oriented to person, place, and time. He appears well-developed and well-nourished.  HENT:  Head: Normocephalic and atraumatic.  Eyes: Pupils are equal, round, and reactive to light.  Neck: Neck supple.  Pulmonary/Chest: Effort normal.  Abdominal: Soft.  Musculoskeletal: Normal range of motion.    Neurological: He is alert and oriented to person, place, and time.  Skin: Skin is warm.  Psychiatric: He has a normal mood and affect. His behavior is normal. Judgment and thought content normal.  Nursing note and vitals reviewed.   Ortho Exam Right upper extremity exam shows swelling and bruising without any neurovascular compromise. Specialty Comments:  No specialty comments available.  Imaging: No results found.   PMFS History: Patient Active Problem List   Diagnosis Date Noted  . Morbid obesity due to excess calories ([place]) 05/16/2015  . Chronic pain syndrome 05/16/2015  . OSA on CPAP 05/16/2015  . RLS (restless legs syndrome) 07/09/2014  . Paroxysmal atrial fibrillation ([place]) 07/09/2014  . Obesity hypoventilation syndrome ([place]) 07/09/2014  . OSA treated with BiPAP 07/09/2014  . Altered mental state 06/29/2014  . UTI (lower urinary tract infection) 06/29/2014    Class: Acute  . Back pain 06/29/2014    Class: Chronic  . Obstructive sleep apnea hypopnea, severe 06/29/2014    Class: Chronic  . Altered mental status 06/29/2014   Past Medical History:  Diagnosis Date  . Arthritis   . Chronic back pain   . Coronary artery disease    40% mLAD/DIAG bifurcation 03/25/10   . Dysrhythmia    History of atrial fibrillation  . Gout   . H/O hiatal hernia   .  Hearing decreased    right ear  . History of bladder infections   . History of bleeding ulcers   . History of ulcer disease   . Hypercholesteremia   . Hypertension   . Nocturia   . Obstructive sleep apnea hypopnea, severe   . Sleep apnea    cpap sleep study 2011    Family History  Problem Relation Age of Onset  . Heart failure Mother   . Diabetes Mother   . Arthritis Mother   . High blood pressure Mother   . Heart failure Father   . Colon cancer Neg Hx     Past Surgical History:  Procedure Laterality Date  . BACK SURGERY    . CARDIAC CATHETERIZATION     2011  . cardioverson    . COLONOSCOPY  08/27/2011    Procedure: COLONOSCOPY;  Surgeon: Rogene Houston, MD;  [facility];  Service: Endoscopy;  Laterality: N/A;  930  . ESOPHAGOGASTRODUODENOSCOPY    . JOINT REPLACEMENT     left shoulder  . Left shoulder replacement    . LUMBAR LAMINECTOMY  03/15/2012   Procedure: LUMBAR LAMINECTOMY WITH  X-STOP 2 LEVEL;  Surgeon: Kristeen Miss, MD;  Location: [facility] NEURO ORS;  Service: Neurosurgery;  Laterality: N/A;  Lumbar two-three, three-four XSTOP  . TONSILLECTOMY     Social History   Occupational History    Comment: Retired  Tobacco Use  . Smoking status: Former Smoker    Packs/day: 1.00    Years: 3.00    Pack years: 3.00    Types: Cigarettes  . Smokeless tobacco: Never Used  . Tobacco comment: Quit 8 years ago.  Substance and Sexual Activity  . Alcohol use: Yes    Alcohol/week: 14.0 standard drinks    Types: 14 Standard drinks or equivalent per week    Comment: one drink a day  . Drug use: No  . Sexual activity: Not on file

## 2018-03-23 DIAGNOSIS — S42301D Unspecified fracture of shaft of humerus, right arm, subsequent encounter for fracture with routine healing: Secondary | ICD-10-CM | POA: Diagnosis not present

## 2018-03-23 DIAGNOSIS — G9340 Encephalopathy, unspecified: Secondary | ICD-10-CM | POA: Diagnosis not present

## 2018-03-23 DIAGNOSIS — D649 Anemia, unspecified: Secondary | ICD-10-CM | POA: Diagnosis not present

## 2018-03-23 DIAGNOSIS — M5136 Other intervertebral disc degeneration, lumbar region: Secondary | ICD-10-CM | POA: Diagnosis not present

## 2018-03-24 ENCOUNTER — Other Ambulatory Visit: Payer: Self-pay | Admitting: *Deleted

## 2018-03-24 NOTE — Patient Outreach (Signed)
[facility]) Care Management  03/24/2018  Carlos Hanson [birth year] [id number]   Collaboration with Judson Roch, SW at facility.  She reports that patient will discharge 03/25/18 back to ILF with his wife. She has set up home care for PT/OT through [facility].   No [facility] care management needs identified, patient MD office provides transition of care per [facility] patient navigator note. Will collaborate with [facility] as indicated.  Will sign off. Royetta Crochet. Laymond Purser, RN, BSN, [place] [phone]) Business Cell  [phone]) Toll Free Office

## 2018-03-29 ENCOUNTER — Telehealth (INDEPENDENT_AMBULATORY_CARE_PROVIDER_SITE_OTHER): Payer: Self-pay

## 2018-03-29 NOTE — Telephone Encounter (Signed)
FYI-Dr.Xu  Kecia with [facility] called stating that patient's family would like to wait until his F/U on 04/05/2018 before starting PT and if PT is still needed, it will start on 04/06/2018.

## 2018-03-30 NOTE — Telephone Encounter (Signed)
FYI

## 2018-04-05 ENCOUNTER — Ambulatory Visit (INDEPENDENT_AMBULATORY_CARE_PROVIDER_SITE_OTHER): Payer: PPO

## 2018-04-05 ENCOUNTER — Ambulatory Visit (INDEPENDENT_AMBULATORY_CARE_PROVIDER_SITE_OTHER): Payer: PPO | Admitting: Orthopaedic Surgery

## 2018-04-05 ENCOUNTER — Encounter (INDEPENDENT_AMBULATORY_CARE_PROVIDER_SITE_OTHER): Payer: Self-pay | Admitting: Orthopaedic Surgery

## 2018-04-05 DIAGNOSIS — S42291D Other displaced fracture of upper end of right humerus, subsequent encounter for fracture with routine healing: Secondary | ICD-10-CM

## 2018-04-05 NOTE — Progress Notes (Signed)
Post-Op Visit Note   Patient: Carlos Hanson           Date of Birth: Jun 03, 1940           MRN: [id number] Visit Date: 04/05/2018 PCP: Leanna Battles, MD   Assessment & Plan:  Chief Complaint:  Chief Complaint  Patient presents with  . Right Shoulder - Follow-up   Visit Diagnoses:  1. Other closed displaced fracture of proximal end of right humerus with routine healing, subsequent encounter     Plan: [name] is a month status post right proximal humerus fracture that we have been treating nonsurgically.  He has been compliant with his sling.  He is feeling much better clinically.  His x-rays demonstrate stable alignment with some evidence of healing.  At this point he may begin pendulums.  I gave him a prescription for home physical therapy.  He may progress to range of motion in 2 weeks as well as strengthening.  Recheck in 4 weeks with 2 view x-rays of the right shoulder.  Follow-Up Instructions: Return in about 4 weeks (around 05/03/2018).   Orders:  Orders Placed This Encounter  Procedures  . XR Shoulder Right   No orders of the defined types were placed in this encounter.   Imaging: Xr Shoulder Right  Result Date: 04/05/2018 Stable appearance of right proximal humerus fracture and varus angulation.  There is evidence of some bony consolidation and healing.   PMFS History: Patient Active Problem List   Diagnosis Date Noted  . Morbid obesity due to excess calories ([place]) 05/16/2015  . Chronic pain syndrome 05/16/2015  . OSA on CPAP 05/16/2015  . RLS (restless legs syndrome) 07/09/2014  . Paroxysmal atrial fibrillation ([place]) 07/09/2014  . Obesity hypoventilation syndrome ([place]) 07/09/2014  . OSA treated with BiPAP 07/09/2014  . Altered mental state 06/29/2014  . UTI (lower urinary tract infection) 06/29/2014    Class: Acute  . Back pain 06/29/2014    Class: Chronic  . Obstructive sleep apnea hypopnea, severe 06/29/2014    Class: Chronic  . Altered mental status  06/29/2014   Past Medical History:  Diagnosis Date  . Arthritis   . Chronic back pain   . Coronary artery disease    40% mLAD/DIAG bifurcation 03/25/10   . Dysrhythmia    History of atrial fibrillation  . Gout   . H/O hiatal hernia   . Hearing decreased    right ear  . History of bladder infections   . History of bleeding ulcers   . History of ulcer disease   . Hypercholesteremia   . Hypertension   . Nocturia   . Obstructive sleep apnea hypopnea, severe   . Sleep apnea    cpap sleep study 2011    Family History  Problem Relation Age of Onset  . Heart failure Mother   . Diabetes Mother   . Arthritis Mother   . High blood pressure Mother   . Heart failure Father   . Colon cancer Neg Hx     Past Surgical History:  Procedure Laterality Date  . BACK SURGERY    . CARDIAC CATHETERIZATION     2011  . cardioverson    . COLONOSCOPY  08/27/2011   Procedure: COLONOSCOPY;  Surgeon: Rogene Houston, MD;  [facility];  Service: Endoscopy;  Laterality: N/A;  930  . ESOPHAGOGASTRODUODENOSCOPY    . JOINT REPLACEMENT     left shoulder  . Left shoulder replacement    . LUMBAR  LAMINECTOMY  03/15/2012   Procedure: LUMBAR LAMINECTOMY WITH  X-STOP 2 LEVEL;  Surgeon: Kristeen Miss, MD;  Location: [facility] NEURO ORS;  Service: Neurosurgery;  Laterality: N/A;  Lumbar two-three, three-four XSTOP  . TONSILLECTOMY     Social History   Occupational History    Comment: Retired  Tobacco Use  . Smoking status: Former Smoker    Packs/day: 1.00    Years: 3.00    Pack years: 3.00    Types: Cigarettes  . Smokeless tobacco: Never Used  . Tobacco comment: Quit 8 years ago.  Substance and Sexual Activity  . Alcohol use: Yes    Alcohol/week: 14.0 standard drinks    Types: 14 Standard drinks or equivalent per week    Comment: one drink a day  . Drug use: No  . Sexual activity: Not on file

## 2018-04-07 DIAGNOSIS — M109 Gout, unspecified: Secondary | ICD-10-CM | POA: Diagnosis not present

## 2018-04-07 DIAGNOSIS — F1120 Opioid dependence, uncomplicated: Secondary | ICD-10-CM | POA: Diagnosis not present

## 2018-04-07 DIAGNOSIS — R2689 Other abnormalities of gait and mobility: Secondary | ICD-10-CM | POA: Diagnosis not present

## 2018-04-07 DIAGNOSIS — K7290 Hepatic failure, unspecified without coma: Secondary | ICD-10-CM | POA: Diagnosis not present

## 2018-04-07 DIAGNOSIS — M545 Low back pain: Secondary | ICD-10-CM | POA: Diagnosis not present

## 2018-04-07 DIAGNOSIS — G4733 Obstructive sleep apnea (adult) (pediatric): Secondary | ICD-10-CM | POA: Diagnosis not present

## 2018-04-07 DIAGNOSIS — I10 Essential (primary) hypertension: Secondary | ICD-10-CM | POA: Diagnosis not present

## 2018-04-07 DIAGNOSIS — S42301D Unspecified fracture of shaft of humerus, right arm, subsequent encounter for fracture with routine healing: Secondary | ICD-10-CM | POA: Diagnosis not present

## 2018-04-07 DIAGNOSIS — I480 Paroxysmal atrial fibrillation: Secondary | ICD-10-CM | POA: Diagnosis not present

## 2018-04-07 DIAGNOSIS — Z6841 Body Mass Index (BMI) 40.0 and over, adult: Secondary | ICD-10-CM | POA: Diagnosis not present

## 2018-04-08 DIAGNOSIS — I2510 Atherosclerotic heart disease of native coronary artery without angina pectoris: Secondary | ICD-10-CM | POA: Diagnosis not present

## 2018-04-08 DIAGNOSIS — G4733 Obstructive sleep apnea (adult) (pediatric): Secondary | ICD-10-CM | POA: Diagnosis not present

## 2018-04-08 DIAGNOSIS — S42211D Unspecified displaced fracture of surgical neck of right humerus, subsequent encounter for fracture with routine healing: Secondary | ICD-10-CM | POA: Diagnosis not present

## 2018-04-08 DIAGNOSIS — Z7901 Long term (current) use of anticoagulants: Secondary | ICD-10-CM | POA: Diagnosis not present

## 2018-04-08 DIAGNOSIS — E662 Morbid (severe) obesity with alveolar hypoventilation: Secondary | ICD-10-CM | POA: Diagnosis not present

## 2018-04-08 DIAGNOSIS — Z9181 History of falling: Secondary | ICD-10-CM | POA: Diagnosis not present

## 2018-04-08 DIAGNOSIS — F1099 Alcohol use, unspecified with unspecified alcohol-induced disorder: Secondary | ICD-10-CM | POA: Diagnosis not present

## 2018-04-08 DIAGNOSIS — I10 Essential (primary) hypertension: Secondary | ICD-10-CM | POA: Diagnosis not present

## 2018-04-08 DIAGNOSIS — H3530 Unspecified macular degeneration: Secondary | ICD-10-CM | POA: Diagnosis not present

## 2018-04-08 DIAGNOSIS — I480 Paroxysmal atrial fibrillation: Secondary | ICD-10-CM | POA: Diagnosis not present

## 2018-04-08 DIAGNOSIS — M5136 Other intervertebral disc degeneration, lumbar region: Secondary | ICD-10-CM | POA: Diagnosis not present

## 2018-04-08 DIAGNOSIS — D649 Anemia, unspecified: Secondary | ICD-10-CM | POA: Diagnosis not present

## 2018-04-08 DIAGNOSIS — R339 Retention of urine, unspecified: Secondary | ICD-10-CM | POA: Diagnosis not present

## 2018-04-08 DIAGNOSIS — K219 Gastro-esophageal reflux disease without esophagitis: Secondary | ICD-10-CM | POA: Diagnosis not present

## 2018-04-08 DIAGNOSIS — E785 Hyperlipidemia, unspecified: Secondary | ICD-10-CM | POA: Diagnosis not present

## 2018-04-08 DIAGNOSIS — Z87891 Personal history of nicotine dependence: Secondary | ICD-10-CM | POA: Diagnosis not present

## 2018-04-08 DIAGNOSIS — G2581 Restless legs syndrome: Secondary | ICD-10-CM | POA: Diagnosis not present

## 2018-04-19 ENCOUNTER — Ambulatory Visit (INDEPENDENT_AMBULATORY_CARE_PROVIDER_SITE_OTHER): Payer: PPO | Admitting: Orthopaedic Surgery

## 2018-04-19 DIAGNOSIS — Z87891 Personal history of nicotine dependence: Secondary | ICD-10-CM | POA: Diagnosis not present

## 2018-04-19 DIAGNOSIS — I480 Paroxysmal atrial fibrillation: Secondary | ICD-10-CM | POA: Diagnosis not present

## 2018-04-19 DIAGNOSIS — D649 Anemia, unspecified: Secondary | ICD-10-CM | POA: Diagnosis not present

## 2018-04-19 DIAGNOSIS — E662 Morbid (severe) obesity with alveolar hypoventilation: Secondary | ICD-10-CM | POA: Diagnosis not present

## 2018-04-19 DIAGNOSIS — M5136 Other intervertebral disc degeneration, lumbar region: Secondary | ICD-10-CM | POA: Diagnosis not present

## 2018-04-19 DIAGNOSIS — R339 Retention of urine, unspecified: Secondary | ICD-10-CM | POA: Diagnosis not present

## 2018-04-19 DIAGNOSIS — H3530 Unspecified macular degeneration: Secondary | ICD-10-CM | POA: Diagnosis not present

## 2018-04-19 DIAGNOSIS — S42211D Unspecified displaced fracture of surgical neck of right humerus, subsequent encounter for fracture with routine healing: Secondary | ICD-10-CM | POA: Diagnosis not present

## 2018-04-19 DIAGNOSIS — G4733 Obstructive sleep apnea (adult) (pediatric): Secondary | ICD-10-CM | POA: Diagnosis not present

## 2018-04-19 DIAGNOSIS — Z9181 History of falling: Secondary | ICD-10-CM | POA: Diagnosis not present

## 2018-04-19 DIAGNOSIS — K219 Gastro-esophageal reflux disease without esophagitis: Secondary | ICD-10-CM | POA: Diagnosis not present

## 2018-04-19 DIAGNOSIS — I10 Essential (primary) hypertension: Secondary | ICD-10-CM | POA: Diagnosis not present

## 2018-04-19 DIAGNOSIS — I2510 Atherosclerotic heart disease of native coronary artery without angina pectoris: Secondary | ICD-10-CM | POA: Diagnosis not present

## 2018-04-19 DIAGNOSIS — E785 Hyperlipidemia, unspecified: Secondary | ICD-10-CM | POA: Diagnosis not present

## 2018-04-19 DIAGNOSIS — Z7901 Long term (current) use of anticoagulants: Secondary | ICD-10-CM | POA: Diagnosis not present

## 2018-04-19 DIAGNOSIS — F1099 Alcohol use, unspecified with unspecified alcohol-induced disorder: Secondary | ICD-10-CM | POA: Diagnosis not present

## 2018-04-19 DIAGNOSIS — G2581 Restless legs syndrome: Secondary | ICD-10-CM | POA: Diagnosis not present

## 2018-04-29 ENCOUNTER — Telehealth (INDEPENDENT_AMBULATORY_CARE_PROVIDER_SITE_OTHER): Payer: Self-pay | Admitting: Orthopaedic Surgery

## 2018-04-29 NOTE — Telephone Encounter (Signed)
Called Tom back to advise.

## 2018-04-29 NOTE — Telephone Encounter (Signed)
See message.

## 2018-04-29 NOTE — Telephone Encounter (Signed)
If no symptoms then that's fine

## 2018-04-29 NOTE — Telephone Encounter (Signed)
Clare Gandy, PT Assistant with [facility] left a message stating that the patient's HR was 56.  If you have any questions or concerns his CB#[phone].  Thank you.

## 2018-05-02 DIAGNOSIS — Z87891 Personal history of nicotine dependence: Secondary | ICD-10-CM | POA: Diagnosis not present

## 2018-05-02 DIAGNOSIS — M5136 Other intervertebral disc degeneration, lumbar region: Secondary | ICD-10-CM | POA: Diagnosis not present

## 2018-05-02 DIAGNOSIS — H3530 Unspecified macular degeneration: Secondary | ICD-10-CM | POA: Diagnosis not present

## 2018-05-02 DIAGNOSIS — Z9181 History of falling: Secondary | ICD-10-CM | POA: Diagnosis not present

## 2018-05-02 DIAGNOSIS — Z7901 Long term (current) use of anticoagulants: Secondary | ICD-10-CM | POA: Diagnosis not present

## 2018-05-02 DIAGNOSIS — E662 Morbid (severe) obesity with alveolar hypoventilation: Secondary | ICD-10-CM | POA: Diagnosis not present

## 2018-05-02 DIAGNOSIS — D649 Anemia, unspecified: Secondary | ICD-10-CM | POA: Diagnosis not present

## 2018-05-02 DIAGNOSIS — I10 Essential (primary) hypertension: Secondary | ICD-10-CM | POA: Diagnosis not present

## 2018-05-02 DIAGNOSIS — K219 Gastro-esophageal reflux disease without esophagitis: Secondary | ICD-10-CM | POA: Diagnosis not present

## 2018-05-02 DIAGNOSIS — R339 Retention of urine, unspecified: Secondary | ICD-10-CM | POA: Diagnosis not present

## 2018-05-02 DIAGNOSIS — S42211D Unspecified displaced fracture of surgical neck of right humerus, subsequent encounter for fracture with routine healing: Secondary | ICD-10-CM | POA: Diagnosis not present

## 2018-05-02 DIAGNOSIS — F1099 Alcohol use, unspecified with unspecified alcohol-induced disorder: Secondary | ICD-10-CM | POA: Diagnosis not present

## 2018-05-02 DIAGNOSIS — E785 Hyperlipidemia, unspecified: Secondary | ICD-10-CM | POA: Diagnosis not present

## 2018-05-02 DIAGNOSIS — I2510 Atherosclerotic heart disease of native coronary artery without angina pectoris: Secondary | ICD-10-CM | POA: Diagnosis not present

## 2018-05-02 DIAGNOSIS — I480 Paroxysmal atrial fibrillation: Secondary | ICD-10-CM | POA: Diagnosis not present

## 2018-05-02 DIAGNOSIS — G4733 Obstructive sleep apnea (adult) (pediatric): Secondary | ICD-10-CM | POA: Diagnosis not present

## 2018-05-02 DIAGNOSIS — G2581 Restless legs syndrome: Secondary | ICD-10-CM | POA: Diagnosis not present

## 2018-05-03 ENCOUNTER — Ambulatory Visit (INDEPENDENT_AMBULATORY_CARE_PROVIDER_SITE_OTHER): Payer: PPO | Admitting: Orthopaedic Surgery

## 2018-05-03 ENCOUNTER — Ambulatory Visit (INDEPENDENT_AMBULATORY_CARE_PROVIDER_SITE_OTHER): Payer: PPO

## 2018-05-03 DIAGNOSIS — S42291D Other displaced fracture of upper end of right humerus, subsequent encounter for fracture with routine healing: Secondary | ICD-10-CM

## 2018-05-03 DIAGNOSIS — M79601 Pain in right arm: Secondary | ICD-10-CM | POA: Diagnosis not present

## 2018-05-03 NOTE — Progress Notes (Signed)
Post-Op Visit Note   Patient: Carlos Hanson           Date of Birth: 06-26-1940           MRN: [id number] Visit Date: 05/03/2018 PCP: Leanna Battles, MD   Assessment & Plan:  Chief Complaint: No chief complaint on file.  Visit Diagnoses:  1. Other closed displaced fracture of proximal end of right humerus with routine healing, subsequent encounter   2. Arm pain, central, right     Plan: Mr. Rossetti is 2 months status post right proximal humerus fracture.  He follows up today for recheck.  He is doing well overall.  He is not taking any pain medicines.  He has 1 more session of OT and then he will be released to home exercises.  His physical exam is significantly improved in terms of range of motion and pain.  He has active forward flexion to 125 degrees, external rotation to 45 degrees, internal rotation to beltline.  He has no swelling.  Overall he is doing well.  His x-rays have demonstrated healing of the fracture.  At this point he can discontinue the sling.  Increase activity as tolerated.  Follow-up as needed.  Follow-Up Instructions: Return if symptoms worsen or fail to improve.   Orders:  Orders Placed This Encounter  Procedures  . XR Humerus Right   No orders of the defined types were placed in this encounter.   Imaging: Xr Humerus Right  Result Date: 05/03/2018 Healed proximal humerus fracture and varus angulation   PMFS History: Patient Active Problem List   Diagnosis Date Noted  . Morbid obesity due to excess calories ([place]) 05/16/2015  . Chronic pain syndrome 05/16/2015  . OSA on CPAP 05/16/2015  . RLS (restless legs syndrome) 07/09/2014  . Paroxysmal atrial fibrillation ([place]) 07/09/2014  . Obesity hypoventilation syndrome ([place]) 07/09/2014  . OSA treated with BiPAP 07/09/2014  . Altered mental state 06/29/2014  . UTI (lower urinary tract infection) 06/29/2014    Class: Acute  . Back pain 06/29/2014    Class: Chronic  . Obstructive sleep apnea hypopnea,  severe 06/29/2014    Class: Chronic  . Altered mental status 06/29/2014   Past Medical History:  Diagnosis Date  . Arthritis   . Chronic back pain   . Coronary artery disease    40% mLAD/DIAG bifurcation 03/25/10   . Dysrhythmia    History of atrial fibrillation  . Gout   . H/O hiatal hernia   . Hearing decreased    right ear  . History of bladder infections   . History of bleeding ulcers   . History of ulcer disease   . Hypercholesteremia   . Hypertension   . Nocturia   . Obstructive sleep apnea hypopnea, severe   . Sleep apnea    cpap sleep study 2011    Family History  Problem Relation Age of Onset  . Heart failure Mother   . Diabetes Mother   . Arthritis Mother   . High blood pressure Mother   . Heart failure Father   . Colon cancer Neg Hx     Past Surgical History:  Procedure Laterality Date  . BACK SURGERY    . CARDIAC CATHETERIZATION     2011  . cardioverson    . COLONOSCOPY  08/27/2011   Procedure: COLONOSCOPY;  Surgeon: Rogene Houston, MD;  Location: [facility];  Service: Endoscopy;  Laterality: N/A;  930  . ESOPHAGOGASTRODUODENOSCOPY    .  JOINT REPLACEMENT     left shoulder  . Left shoulder replacement    . LUMBAR LAMINECTOMY  03/15/2012   Procedure: LUMBAR LAMINECTOMY WITH  X-STOP 2 LEVEL;  Surgeon: Kristeen Miss, MD;  Location: [place] [facility];  Service: Neurosurgery;  Laterality: N/A;  Lumbar two-three, three-four XSTOP  . TONSILLECTOMY     Social History   Occupational History    Comment: Retired  Tobacco Use  . Smoking status: Former Smoker    Packs/day: 1.00    Years: 3.00    Pack years: 3.00    Types: Cigarettes  . Smokeless tobacco: Never Used  . Tobacco comment: Quit 8 years ago.  Substance and Sexual Activity  . Alcohol use: Yes    Alcohol/week: 14.0 standard drinks    Types: 14 Standard drinks or equivalent per week    Comment: one drink a day  . Drug use: No  . Sexual activity: Not on file

## 2018-05-10 ENCOUNTER — Ambulatory Visit: Payer: PPO | Admitting: Adult Health

## 2018-06-20 DIAGNOSIS — G4733 Obstructive sleep apnea (adult) (pediatric): Secondary | ICD-10-CM | POA: Diagnosis not present

## 2018-06-22 DIAGNOSIS — I480 Paroxysmal atrial fibrillation: Secondary | ICD-10-CM | POA: Diagnosis not present

## 2018-06-22 DIAGNOSIS — F3289 Other specified depressive episodes: Secondary | ICD-10-CM | POA: Diagnosis not present

## 2018-06-22 DIAGNOSIS — I10 Essential (primary) hypertension: Secondary | ICD-10-CM | POA: Diagnosis not present

## 2018-06-22 DIAGNOSIS — Z6841 Body Mass Index (BMI) 40.0 and over, adult: Secondary | ICD-10-CM | POA: Diagnosis not present

## 2018-06-22 DIAGNOSIS — Z0000 Encounter for general adult medical examination without abnormal findings: Secondary | ICD-10-CM | POA: Diagnosis not present

## 2018-06-22 DIAGNOSIS — Z1331 Encounter for screening for depression: Secondary | ICD-10-CM | POA: Diagnosis not present

## 2018-06-22 DIAGNOSIS — R2689 Other abnormalities of gait and mobility: Secondary | ICD-10-CM | POA: Diagnosis not present

## 2018-06-22 DIAGNOSIS — R7301 Impaired fasting glucose: Secondary | ICD-10-CM | POA: Diagnosis not present

## 2018-06-22 DIAGNOSIS — K7290 Hepatic failure, unspecified without coma: Secondary | ICD-10-CM | POA: Diagnosis not present

## 2018-06-22 DIAGNOSIS — M545 Low back pain: Secondary | ICD-10-CM | POA: Diagnosis not present

## 2018-06-22 DIAGNOSIS — G4733 Obstructive sleep apnea (adult) (pediatric): Secondary | ICD-10-CM | POA: Diagnosis not present

## 2018-06-24 DIAGNOSIS — Z1212 Encounter for screening for malignant neoplasm of rectum: Secondary | ICD-10-CM | POA: Diagnosis not present

## 2018-06-27 ENCOUNTER — Other Ambulatory Visit: Payer: Self-pay | Admitting: Cardiology

## 2018-06-27 DIAGNOSIS — I480 Paroxysmal atrial fibrillation: Secondary | ICD-10-CM | POA: Diagnosis not present

## 2018-06-28 LAB — COMPREHENSIVE METABOLIC PANEL
ALK PHOS: 93 IU/L (ref 39–117)
ALT: 20 IU/L (ref 0–44)
AST: 12 IU/L (ref 0–40)
Albumin/Globulin Ratio: 2 (ref 1.2–2.2)
Albumin: 4.1 g/dL (ref 3.7–4.7)
BILIRUBIN TOTAL: 0.3 mg/dL (ref 0.0–1.2)
BUN/Creatinine Ratio: 18 (ref 10–24)
BUN: 19 mg/dL (ref 8–27)
CHLORIDE: 101 mmol/L (ref 96–106)
CO2: 23 mmol/L (ref 20–29)
CREATININE: 1.06 mg/dL (ref 0.76–1.27)
Calcium: 9 mg/dL (ref 8.6–10.2)
GFR calc Af Amer: 77 mL/min/{1.73_m2} (ref >59)
GFR calc non Af Amer: 66 mL/min/{1.73_m2} (ref >59)
GLUCOSE: 94 mg/dL (ref 65–99)
Globulin, Total: 2.1 g/dL (ref 1.5–4.5)
Potassium: 4.8 mmol/L (ref 3.5–5.2)
Sodium: 138 mmol/L (ref 134–144)
Total Protein: 6.2 g/dL (ref 6.0–8.5)

## 2018-06-28 LAB — CBC WITH DIFFERENTIAL/PLATELET
BASOS ABS: 0 10*3/uL (ref 0.0–0.2)
Basos: 1 %
EOS (ABSOLUTE): 0.3 10*3/uL (ref 0.0–0.4)
Eos: 4 %
Hematocrit: 43.2 % (ref 37.5–51.0)
Hemoglobin: 14.1 g/dL (ref 13.0–17.7)
IMMATURE GRANS (ABS): 0 10*3/uL (ref 0.0–0.1)
Immature Granulocytes: 0 %
LYMPHS ABS: 2 10*3/uL (ref 0.7–3.1)
LYMPHS: 24 %
MCH: 29.7 pg (ref 26.6–33.0)
MCHC: 32.6 g/dL (ref 31.5–35.7)
MCV: 91 fL (ref 79–97)
Monocytes Absolute: 0.6 10*3/uL (ref 0.1–0.9)
Monocytes: 7 %
NEUTROS ABS: 5.4 10*3/uL (ref 1.4–7.0)
Neutrophils: 64 %
PLATELETS: 351 10*3/uL (ref 150–450)
RBC: 4.75 x10E6/uL (ref 4.14–5.80)
RDW: 13.4 % (ref 11.6–15.4)
WBC: 8.3 10*3/uL (ref 3.4–10.8)

## 2018-06-28 LAB — TSH: TSH: 6.08 u[IU]/mL — AB (ref 0.450–4.500)

## 2018-06-29 ENCOUNTER — Other Ambulatory Visit: Payer: Self-pay

## 2018-06-29 DIAGNOSIS — I480 Paroxysmal atrial fibrillation: Secondary | ICD-10-CM

## 2018-06-29 MED ORDER — XARELTO 15 MG PO TABS: 15.0000 mg | ORAL_TABLET | Freq: Every day | ORAL | 6 refills | Status: DC

## 2018-07-08 ENCOUNTER — Other Ambulatory Visit: Payer: Self-pay

## 2018-07-14 ENCOUNTER — Encounter: Payer: Self-pay | Admitting: Cardiology

## 2018-07-14 ENCOUNTER — Other Ambulatory Visit: Payer: Self-pay

## 2018-07-14 ENCOUNTER — Ambulatory Visit: Payer: PPO | Admitting: Cardiology

## 2018-07-14 VITALS — BP 121/55 | HR 55 | Ht 71.0 in | Wt 300.2 lb

## 2018-07-14 DIAGNOSIS — I10 Essential (primary) hypertension: Secondary | ICD-10-CM | POA: Diagnosis not present

## 2018-07-14 DIAGNOSIS — I480 Paroxysmal atrial fibrillation: Secondary | ICD-10-CM | POA: Diagnosis not present

## 2018-07-14 DIAGNOSIS — G4733 Obstructive sleep apnea (adult) (pediatric): Secondary | ICD-10-CM | POA: Diagnosis not present

## 2018-07-14 DIAGNOSIS — Z9989 Dependence on other enabling machines and devices: Secondary | ICD-10-CM | POA: Diagnosis not present

## 2018-07-14 MED ORDER — RIVAROXABAN 20 MG PO TABS: 20.0000 mg | ORAL_TABLET | Freq: Every day | ORAL | 11 refills | Status: DC

## 2018-07-14 NOTE — Progress Notes (Signed)
Subjective:  Primary Physician:  Leanna Battles, MD  Patient ID: Carlos Hanson, male    DOB: [birth year], 78 y.o.   MRN: [id number]  Chief Complaint  Patient presents with  . Atrial Fibrillation  . Follow-up    1 yr    HPI: HARSHAAN Hanson  is a 78 y.o. male  with  morbid obesity, hypertension, obstructive sleep apnea on CPAP and chronic back pain and has had back surgery in the past and on chronic pain meds. Presently on low-dose amiodarone and Xarelto and has maintained sinus rhythm. Presents here for annual visit.  He does have sleep apnea and on CPAP machine, he has been using this on a regular basis. He has chronic shortness breath and dyspnea on exertion but denies any worsening symptoms recently. He denies any leg edema, does have mild orthopnea due to obesity. Patient had a fall and had right shoulder fracture, was sent to the hospital for a few days.  Otherwise he is doing well, does have chronic back pain.  No bleeding diathesis.   Past Medical History:  Diagnosis Date  . Arthritis   . Chronic back pain   . Coronary artery disease    40% mLAD/DIAG bifurcation 03/25/10   . Dysrhythmia    History of atrial fibrillation  . Gout   . H/O hiatal hernia   . Hearing decreased    right ear  . History of bladder infections   . History of bleeding ulcers   . History of ulcer disease   . Hypercholesteremia   . Nocturia   . Obstructive sleep apnea hypopnea, severe   . Sleep apnea    cpap sleep study 2011    Past Surgical History:  Procedure Laterality Date  . BACK SURGERY    . CARDIAC CATHETERIZATION     2011  . cardioverson    . COLONOSCOPY  08/27/2011   Procedure: COLONOSCOPY;  Surgeon: Rogene Houston, MD;  Location: [facility];  Service: Endoscopy;  Laterality: N/A;  930  . ESOPHAGOGASTRODUODENOSCOPY    . JOINT REPLACEMENT     left shoulder  . Left shoulder replacement    . LUMBAR LAMINECTOMY  03/15/2012   Procedure: LUMBAR LAMINECTOMY WITH  X-STOP 2  LEVEL;  Surgeon: Kristeen Miss, MD;  Location: [place] [facility];  Service: Neurosurgery;  Laterality: N/A;  Lumbar two-three, three-four XSTOP  . TONSILLECTOMY      Social History   Socioeconomic History  . Marital status: Married    Spouse name: [name]   . Number of children: 3  . Years of education: college  . Highest education level: Not on file  Occupational History    Comment: Retired  Scientific laboratory technician  . Financial resource strain: Not on file  . Food insecurity:    Worry: Not on file    Inability: Not on file  . Transportation needs:    Medical: Not on file    Non-medical: Not on file  Tobacco Use  . Smoking status: Former Smoker    Packs/day: 1.00    Years: 25.00    Pack years: 25.00    Types: Cigarettes    Last attempt to quit: 07/13/2005    Years since quitting: 13.0  . Smokeless tobacco: Never Used  . Tobacco comment: Quit 8 years ago.  Substance and Sexual Activity  . Alcohol use: Yes    Comment: once a week  . Drug use: No  . Sexual activity: Not on file  Lifestyle  .  Physical activity:    Days per week: Not on file    Minutes per session: Not on file  . Stress: Not on file  Relationships  . Social connections:    Talks on phone: Not on file    Gets together: Not on file    Attends religious service: Not on file    Active member of club or organization: Not on file    Attends meetings of clubs or organizations: Not on file    Relationship status: Not on file  . Intimate partner violence:    Fear of current or ex partner: Not on file    Emotionally abused: Not on file    Physically abused: Not on file    Forced sexual activity: Not on file  Other Topics Concern  . Not on file  Social History Narrative   Patient lives at home with his wife [name]).    Retired.   Education college.   Right handed.   Caffeine one cup of coffee daily.    Current Outpatient Medications on File Prior to Visit  Medication Sig Dispense Refill  . acetaminophen (TYLENOL)  500 MG tablet Take 2 tablets (1,000 mg total) by mouth every 8 (eight) hours. 30 tablet 0  . allopurinol (ZYLOPRIM) 300 MG tablet Take 300 mg by mouth daily.    Marland Kitchen amiodarone (PACERONE) 100 MG tablet Take 100 mg by mouth daily.  3  . desmopressin (DDAVP) 0.2 MG tablet Take 0.2 mg by mouth daily. 3 caps in the a.m.  1  . diltiazem (DILACOR XR) 120 MG 24 hr capsule Take 120 mg by mouth daily.     . DULoxetine (CYMBALTA) 30 MG capsule Take 3 capsules (90 mg total) by mouth daily. 6 capsule 0  . gabapentin (NEURONTIN) 100 MG capsule Take 1 capsule (100 mg total) by mouth 2 (two) times daily. 6 capsule 0  . lactulose (CHRONULAC) 10 GM/15ML solution Take 30 mLs (20 g total) by mouth daily. 240 mL 1  . morphine (MS CONTIN) 15 MG 12 hr tablet Take 1 tablet (15 mg total) by mouth every 12 (twelve) hours. 6 tablet 0  . Multiple Vitamins-Minerals (VISION FORMULA EYE HEALTH) CAPS Take 1 tablet by mouth 2 (two) times daily. [employer]    . oxyCODONE (OXY IR/ROXICODONE) 5 MG immediate release tablet Take 1 tablet (5 mg total) by mouth 3 (three) times daily as needed for moderate pain or severe pain. 8 tablet 0  . oxymetazoline (AFRIN) 0.05 % nasal spray Place 1 spray into both nostrils at bedtime as needed for congestion (sinuses).    . pantoprazole (PROTONIX) 40 MG tablet Take 40 mg by mouth 2 (two) times daily.    Marland Kitchen triamterene-hydrochlorothiazide (MAXZIDE-25) 37.5-25 MG per tablet Take 0.5 tablets by mouth every Monday, Wednesday, and Friday.     . zolpidem (AMBIEN) 10 MG tablet Take 10 mg by mouth at bedtime as needed for sleep.    . rosuvastatin (CRESTOR) 20 MG tablet Take 20 mg by mouth daily.      No current facility-administered medications on file prior to visit.     Review of Systems  Constitutional: Negative for malaise/fatigue and weight loss.  HENT:       Snoring - Uses CPAP   Respiratory: Positive for shortness of breath (on Exertion, stable). Negative for cough and hemoptysis.    Cardiovascular: Negative for chest pain, palpitations, claudication and leg swelling.  Gastrointestinal: Positive for constipation (chronic). Negative for abdominal pain, blood in  stool, heartburn and vomiting.  Genitourinary: Negative for dysuria.  Musculoskeletal: Positive for back pain ( chronic) and joint pain (right shoulder and bilateral knee). Negative for myalgias.  Neurological: Negative for dizziness, focal weakness and headaches.  Endo/Heme/Allergies: Does not bruise/bleed easily.  Psychiatric/Behavioral: Negative for depression. The patient is not nervous/anxious.   All other systems reviewed and are negative.      Objective:  Blood pressure (!) 121/55, pulse (!) 55, height 5' 11"  (1.803 m), weight (!) 300 lb 3.2 oz (136.2 kg), SpO2 96 %. Body mass index is 41.87 kg/m.  Physical Exam  Constitutional: He appears well-developed. No distress.  Morbidly obese  HENT:  Head: Atraumatic.  Eyes: Conjunctivae are normal.  Neck: Neck supple. No thyromegaly present.  Short neck and difficult to evaluate JVP  Cardiovascular: Normal rate, regular rhythm and normal heart sounds. Frequent extrasystoles are present. Exam reveals no gallop.  No murmur heard. Pulses:      Carotid pulses are 2+ on the right side and 2+ on the left side.      Dorsalis pedis pulses are 2+ on the right side and 2+ on the left side.       Posterior tibial pulses are 2+ on the right side and 2+ on the left side.  Femoral and popliteal pulse difficult to feel due to patient's body habitus.   Pulmonary/Chest: Effort normal and breath sounds normal.  Abdominal: Soft. Bowel sounds are normal.  Musculoskeletal: Normal range of motion.        General: Edema (2 + pitting bilateral below knee) present.  Neurological: He is alert.  Skin: Skin is warm and dry.  Psychiatric: He has a normal mood and affect.    CARDIAC STUDIES:   Echocardiogram 03/26/2010: Left ventricle: The cavity size was normal. There was  focal basal and moderate concentric hypertrophy. Systolic function was normal. The estimated ejection fraction was in the range of 60% to 65%. Doppler parameters are consistent with elevated mean left atrial filling pressure.- Ventricular septum: The contour showed systolic flattening. - Left atrium: The atrium was moderately dilated. - Right ventricle: The cavity size was mildly dilated.   Chest x-ray PA and lateral view 05/01/2016: Mild bibasilar subsegmental atelectasis. No effusion. No significant change from Chest x-ray PA and lateral view 12/13/2015  Sleep Study 07/09/2014 Asencion Partridge Dohmeier, MD): Positive for OSA, pt uses CPAP.  Cath 03/25/10: Normal LV. Slow flow in the  coronary arteries.  Cardioversion 05/01/10: Failed. transient sinus and back to A. Fib  Recent Labs:   CMP Latest Ref Rng & Units 06/27/2018 03/11/2018 03/10/2018  Glucose 65 - 99 mg/dL 94 112(H) 107(H)  BUN 8 - 27 mg/dL 19 22 23   Creatinine 0.76 - 1.27 mg/dL 1.06 1.19 1.24  Sodium 134 - 144 mmol/L 138 134(L) 136  Potassium 3.5 - 5.2 mmol/L 4.8 3.8 3.9  Chloride 96 - 106 mmol/L 101 100 101  CO2 20 - 29 mmol/L 23 23 26   Calcium 8.6 - 10.2 mg/dL 9.0 8.8(L) 8.9  Total Protein 6.0 - 8.5 g/dL 6.2 - 6.2(L)  Total Bilirubin 0.0 - 1.2 mg/dL 0.3 - 1.0  Alkaline Phos 39 - 117 IU/L 93 - 51  AST 0 - 40 IU/L 12 - 25  ALT 0 - 44 IU/L 20 - 22    CBC Latest Ref Rng & Units 06/27/2018  WBC 3.4 - 10.8 x10E3/uL 8.3  Hemoglobin 13.0 - 17.7 g/dL 14.1  Hematocrit 37.5 - 51.0 % 43.2  Platelets 150 - 450 x10E3/uL  351   07/24/2015: total cholesterol 112, triglycerides 95, HDL 62, LDL 31, ApoB 33, PSA normal, HbA1c 6.0% 06/09/2014: PSA 3.63, 2+ urine protein  Assessment & Recommendations:   Paroxysmal atrial fibrillation (HCC) - Plan: EKG 12-Lead  OSA on CPAP  Morbid obesity (HCC)  Essential hypertension  EKG 07/22/2018: Normal sinus rhythm at the rate of 81 bpm, left atrial enlargement, left axis deviation, left anterior  fascicular block.  Right bundle branch block.  Frequent PACs.  3 ventricular premature beats noted.  Normal QT interval, no evidence of ischemia. EKG 07/09/2017: Sinus rhythm with first-degree AV block at the rate of 75 bpm, left axis deviation, anterior fascicular block. Right bundle branch block. Trifascicular block. PACs.   Recommendation: Patient is here an annual visit and follow-up of paroxysmal atrial fibrillation.  He is presently on amiodarone 100 mg daily, maintaining sinus rhythm but with frequent PACs that was also noted previously a year ago.  Physical examination is unremarkable except for morbid obesity and 2+ leg edema that is also chronic.  We have discussed continuing the anticoagulation for now and no change in amiodarone dosage.  His TSH was mildly elevated, this needs a follow-up.  I have discussed with him regarding morbid obesity and weight loss again.  His blood pressure is well controlled, otherwise on appropriate medical therapy.  I will see him back again in a year.  His labs from the hospital were reviewed and updated.  Patient now has moved to [facility] in [place]. He is presently tolerating Xarelto howevereGFR is >50 and hence needs 20 mg Rx.  Blood pressure is well controlled.   Continues to be compliant with his CPAP.He is maintaining sinus rhythm on low-dose of amiodarone. I have reviewed patient's labs/records and updated them.   Frequent PACs are old.  No changes in the medications were done today except for increasing the dose of salt or.  Adrian Prows, MD, [facility] 07/14/2018, 6:38 PM [facility]. [place] Pager: [phone] Office: [phone] If no answer Cell [phone]

## 2018-09-29 DIAGNOSIS — G4733 Obstructive sleep apnea (adult) (pediatric): Secondary | ICD-10-CM | POA: Diagnosis not present

## 2018-10-11 DIAGNOSIS — H60392 Other infective otitis externa, left ear: Secondary | ICD-10-CM | POA: Diagnosis not present

## 2018-10-11 DIAGNOSIS — T162XXA Foreign body in left ear, initial encounter: Secondary | ICD-10-CM | POA: Diagnosis not present

## 2018-11-03 DIAGNOSIS — M545 Low back pain: Secondary | ICD-10-CM | POA: Diagnosis not present

## 2018-11-03 DIAGNOSIS — K7290 Hepatic failure, unspecified without coma: Secondary | ICD-10-CM | POA: Diagnosis not present

## 2018-11-03 DIAGNOSIS — E039 Hypothyroidism, unspecified: Secondary | ICD-10-CM | POA: Diagnosis not present

## 2018-11-03 DIAGNOSIS — R269 Unspecified abnormalities of gait and mobility: Secondary | ICD-10-CM | POA: Diagnosis not present

## 2018-11-03 DIAGNOSIS — I10 Essential (primary) hypertension: Secondary | ICD-10-CM | POA: Diagnosis not present

## 2018-11-03 DIAGNOSIS — G4733 Obstructive sleep apnea (adult) (pediatric): Secondary | ICD-10-CM | POA: Diagnosis not present

## 2018-12-14 DIAGNOSIS — E038 Other specified hypothyroidism: Secondary | ICD-10-CM | POA: Diagnosis not present

## 2019-01-24 ENCOUNTER — Other Ambulatory Visit: Payer: Self-pay

## 2019-01-24 MED ORDER — AMIODARONE HCL 100 MG PO TABS: 100.0000 mg | ORAL_TABLET | Freq: Every day | ORAL | 3 refills | Status: DC

## 2019-01-24 NOTE — Telephone Encounter (Signed)
Can this be filled ?

## 2019-01-26 ENCOUNTER — Ambulatory Visit (INDEPENDENT_AMBULATORY_CARE_PROVIDER_SITE_OTHER): Payer: PPO | Admitting: Cardiology

## 2019-01-26 ENCOUNTER — Other Ambulatory Visit: Payer: Self-pay

## 2019-01-26 VITALS — BP 145/68 | HR 59 | Temp 97.0°F | Ht 71.0 in | Wt 297.0 lb

## 2019-01-26 DIAGNOSIS — I480 Paroxysmal atrial fibrillation: Secondary | ICD-10-CM | POA: Diagnosis not present

## 2019-01-26 NOTE — Progress Notes (Signed)
Follow up visit  Subjective:   Carlos Hanson, male    DOB: 1940/12/11, 78 y.o.   MRN: [id number]   Chief Complaint  Patient presents with  . Atrial Fibrillation     HPI  78 y/o Caucasian male with hypertension, obstructive sleep apnea on CPAP, morbid obesity, paroxysmal atrial fibrillation, here for urgent visit due to bradycardia.  Patient called this morning, after he noted his heart rate to be in 30s.  He denies any chest pain, shortness of breath, leg edema, presyncope or syncope. He is on amiodarone with increase in TSH. He has been started on treatment for this by PCP. He feels he has felt tired since then.   Past Medical History:  Diagnosis Date  . Arthritis   . Chronic back pain   . Coronary artery disease    40% mLAD/DIAG bifurcation 03/25/10   . Dysrhythmia    History of atrial fibrillation  . Gout   . H/O hiatal hernia   . Hearing decreased    right ear  . History of bladder infections   . History of bleeding ulcers   . History of ulcer disease   . Hypercholesteremia   . Nocturia   . Obstructive sleep apnea hypopnea, severe   . Sleep apnea    cpap sleep study 2011     Past Surgical History:  Procedure Laterality Date  . BACK SURGERY    . CARDIAC CATHETERIZATION     2011  . cardioverson    . COLONOSCOPY  08/27/2011   Procedure: COLONOSCOPY;  Surgeon: Rogene Houston, Carlos Hanson;  [facility];  Service: Endoscopy;  Laterality: N/A;  930  . ESOPHAGOGASTRODUODENOSCOPY    . JOINT REPLACEMENT     left shoulder  . Left shoulder replacement    . LUMBAR LAMINECTOMY  03/15/2012   Procedure: LUMBAR LAMINECTOMY WITH  X-STOP 2 LEVEL;  Surgeon: Kristeen Miss, Carlos Hanson;  Location: [facility];  Service: Neurosurgery;  Laterality: N/A;  Lumbar two-three, three-four XSTOP  . TONSILLECTOMY       Social History   Socioeconomic History  . Marital status: Married    Spouse name: Carlos Hanson   . Number of children: 3  . Years of education: college  . Highest education  level: Not on file  Occupational History    Comment: Retired  Scientific laboratory technician  . Financial resource strain: Not on file  . Food insecurity    Worry: Not on file    Inability: Not on file  . Transportation needs    Medical: Not on file    Non-medical: Not on file  Tobacco Use  . Smoking status: Former Smoker    Packs/day: 1.00    Years: 25.00    Pack years: 25.00    Types: Cigarettes    Quit date: 07/13/2005    Years since quitting: 13.5  . Smokeless tobacco: Never Used  . Tobacco comment: Quit 8 years ago.  Substance and Sexual Activity  . Alcohol use: Yes    Comment: once a week  . Drug use: No  . Sexual activity: Not on file  Lifestyle  . Physical activity    Days per week: Not on file    Minutes per session: Not on file  . Stress: Not on file  Relationships  . Social Herbalist on phone: Not on file    Gets together: Not on file    Attends religious service: Not on file    Active  member of club or organization: Not on file    Attends meetings of clubs or organizations: Not on file    Relationship status: Not on file  . Intimate partner violence    Fear of current or ex partner: Not on file    Emotionally abused: Not on file    Physically abused: Not on file    Forced sexual activity: Not on file  Other Topics Concern  . Not on file  Social History Narrative   Patient lives at home with his wife Carlos Hanson).    Retired.   Education college.   Right handed.   Caffeine one cup of coffee daily.     Family History  Problem Relation Age of Onset  . Heart failure Mother   . Diabetes Mother   . Arthritis Mother   . High blood pressure Mother   . Heart failure Father   . Colon cancer Neg Hx      Current Outpatient Medications on File Prior to Visit  Medication Sig Dispense Refill  . acetaminophen (TYLENOL) 500 MG tablet Take 2 tablets (1,000 mg total) by mouth every 8 (eight) hours. 30 tablet 0  . allopurinol (ZYLOPRIM) 300 MG tablet Take 300 mg by  mouth daily.    Marland Kitchen amiodarone (PACERONE) 100 MG tablet Take 1 tablet (100 mg total) by mouth daily. 90 tablet 3  . atorvastatin (LIPITOR) 20 MG tablet Take 1 tablet by mouth daily.    Marland Kitchen desmopressin (DDAVP) 0.2 MG tablet Take 0.2 mg by mouth daily. 3 caps in the a.m.  1  . diltiazem (DILACOR XR) 120 MG 24 hr capsule Take 120 mg by mouth daily.    . DULoxetine (CYMBALTA) 30 MG capsule Take 3 capsules (90 mg total) by mouth daily. 6 capsule 0  . gabapentin (NEURONTIN) 100 MG capsule Take 1 capsule (100 mg total) by mouth 2 (two) times daily. 6 capsule 0  . lactulose (CHRONULAC) 10 GM/15ML solution Take 30 mLs (20 g total) by mouth daily. 240 mL 1  . levothyroxine (SYNTHROID) 50 MCG tablet Take 50 mcg by mouth daily before breakfast.    . morphine (MS CONTIN) 15 MG 12 hr tablet Take 1 tablet (15 mg total) by mouth every 12 (twelve) hours. 6 tablet 0  . Multiple Vitamins-Minerals (VISION FORMULA EYE HEALTH) CAPS Take 1 tablet by mouth 2 (two) times daily. [employer]    . oxyCODONE (OXY IR/ROXICODONE) 5 MG immediate release tablet Take 1 tablet (5 mg total) by mouth 3 (three) times daily as needed for moderate pain or severe pain. 8 tablet 0  . oxymetazoline (AFRIN) 0.05 % nasal spray Place 1 spray into both nostrils at bedtime as needed for congestion (sinuses).    . pantoprazole (PROTONIX) 40 MG tablet Take 40 mg by mouth 2 (two) times daily.    . rivaroxaban (XARELTO) 20 MG TABS tablet Take 1 tablet (20 mg total) by mouth daily with supper. 30 tablet 11  . triamterene-hydrochlorothiazide (MAXZIDE-25) 37.5-25 MG tablet Take 0.5 tablets by mouth 3 (three) times a week. monday wednesday friday    . zolpidem (AMBIEN) 10 MG tablet Take 10 mg by mouth at bedtime as needed for sleep.     No current facility-administered medications on file prior to visit.     Cardiovascular studies:  EKG 01/26/2019: Probable sinus rhythm 74 bpm Frequent PVC's First degree AV block RBBB. LAFB. No change compared  to previous EKG in 07/2018  Echocardiogram 03/26/2010: Left ventricle: The cavity size  was normal. There was focal basal and moderate concentric hypertrophy. Systolic function was normal. The estimated ejection fraction was in the range of 60% to 65%. Doppler parameters are consistent with elevated mean left atrial filling pressure.- Ventricular septum: The contour showed systolic flattening. - Left atrium: The atrium was moderately dilated. - Right ventricle: The cavity size was mildly dilated.   Chest x-ray PA and lateral view 05/01/2016: Mild bibasilar subsegmental atelectasis. No effusion. No significant change from Chest x-ray PA and lateral view 12/13/2015  Sleep Study 07/09/2014 Carlos Partridge Dohmeier, Carlos Hanson): Positive for OSA, pt uses CPAP.  Cath 03/25/10: Normal LV. Slow flow in the  coronary arteries.  Cardioversion 05/01/10: Failed. transient sinus and back to A. Fib  Recent labs: Results for RITIK, WHITEHORN (MRN [id number]) as of 01/26/2019 14:26  Ref. Range 06/27/2018 12:09  COMPREHENSIVE METABOLIC PANEL Unknown Rpt  Sodium Latest Ref Range: 134 - 144 mmol/L 138  Potassium Latest Ref Range: 3.5 - 5.2 mmol/L 4.8  Chloride Latest Ref Range: 96 - 106 mmol/L 101  CO2 Latest Ref Range: 20 - 29 mmol/L 23  Glucose Latest Ref Range: 65 - 99 mg/dL 94  BUN Latest Ref Range: 8 - 27 mg/dL 19  Creatinine Latest Ref Range: 0.76 - 1.27 mg/dL 1.06  Calcium Latest Ref Range: 8.6 - 10.2 mg/dL 9.0  BUN/Creatinine Ratio Latest Ref Range: 10 - 24  18  Alkaline Phosphatase Latest Ref Range: 39 - 117 IU/L 93  Albumin Latest Ref Range: 3.7 - 4.7 g/dL 4.1  Albumin/Globulin Ratio Latest Ref Range: 1.2 - 2.2  2.0  AST Latest Ref Range: 0 - 40 IU/L 12  ALT Latest Ref Range: 0 - 44 IU/L 20  Total Protein Latest Ref Range: 6.0 - 8.5 g/dL 6.2  Total Bilirubin Latest Ref Range: 0.0 - 1.2 mg/dL 0.3  GFR, Est Non African American Latest Ref Range: >59 mL/min/1.73 66  GFR, Est African American Latest Ref Range:  >59 mL/min/1.73 77   Results for SOPHIE, ARENAS (MRN [id number]) as of 01/26/2019 14:26  Ref. Range 06/27/2018 12:09  WBC Latest Ref Range: 3.4 - 10.8 x10E3/uL 8.3  RBC Latest Ref Range: 4.14 - 5.80 x10E6/uL 4.75  Hemoglobin Latest Ref Range: 13.0 - 17.7 g/dL 14.1  HCT Latest Ref Range: 37.5 - 51.0 % 43.2  MCV Latest Ref Range: 79 - 97 fL 91  MCH Latest Ref Range: 26.6 - 33.0 pg 29.7  MCHC Latest Ref Range: 31.5 - 35.7 g/dL 32.6  RDW Latest Ref Range: 11.6 - 15.4 % 13.4  Platelets Latest Ref Range: 150 - 450 x10E3/uL 351   Results for COLTYN, BEIERLE (MRN [id number]) as of 01/26/2019 14:26  Ref. Range 06/27/2018 12:09  TSH Latest Ref Range: 0.450 - 4.500 uIU/mL 6.080 (H)   Review of Systems  Constitution: Positive for malaise/fatigue. Negative for decreased appetite, weight gain and weight loss.  HENT: Negative for congestion.   Eyes: Negative for visual disturbance.  Cardiovascular: Negative for chest pain, dyspnea on exertion, leg swelling, palpitations and syncope.  Respiratory: Negative for cough.   Endocrine: Negative for cold intolerance.  Hematologic/Lymphatic: Does not bruise/bleed easily.  Skin: Negative for itching and rash.  Musculoskeletal: Negative for myalgias.  Gastrointestinal: Negative for abdominal pain, nausea and vomiting.  Genitourinary: Negative for dysuria.  Neurological: Negative for dizziness and weakness.  Psychiatric/Behavioral: The patient is not nervous/anxious.   All other systems reviewed and are negative.        Vitals:   01/26/19 1008  BP: Marland Kitchen)  145/68  Pulse: (!) 59  Temp: (!) 97 F (36.1 C)    Body mass index is 41.42 kg/m. Filed Weights   01/26/19 1008  Weight: 297 lb (134.7 kg)     Objective:   Physical Exam  Constitutional: He is oriented to person, place, and time. He appears well-developed and well-nourished. No distress.  HENT:  Head: Normocephalic and atraumatic.  Eyes: Pupils are equal, round, and reactive to light.  Conjunctivae are normal.  Neck: No JVD present.  Cardiovascular: Normal rate, regular rhythm and intact distal pulses.  Occasional extrasystoles are present.  Pulmonary/Chest: Effort normal and breath sounds normal. He has no wheezes. He has no rales.  Abdominal: Soft. Bowel sounds are normal. There is no rebound.  Musculoskeletal:        General: No edema.  Lymphadenopathy:    He has no cervical adenopathy.  Neurological: He is alert and oriented to person, place, and time. No cranial nerve deficit.  Skin: Skin is warm and dry.  Psychiatric: He has a normal mood and affect.  Nursing note and vitals reviewed.         Assessment & Recommendations:   78 y/o Caucasian male with hypertension, obstructive sleep apnea on CPAP, morbid obesity, paroxysmal atrial fibrillation, here for urgent visit due to bradycardia.  Perceived bradycardia: Likely due to occasional PAC's. No change in EKG and overall symptomatology and physical exam. Continue current medications.F/u w/Dr. Einar Gip in 6 months.  Hypertension: BP elevated. He has not taken his meds today. No change made  PAF: CHA2DS2VASc score 3, annual stroke risk 3.2%. Continue Xarelto 20 mg daily.   Nigel Mormon, Carlos Hanson  [facility]. [place] Pager: [phone] Office: [phone] If no answer Cell [phone]

## 2019-02-07 DIAGNOSIS — G4733 Obstructive sleep apnea (adult) (pediatric): Secondary | ICD-10-CM | POA: Diagnosis not present

## 2019-02-07 DIAGNOSIS — E039 Hypothyroidism, unspecified: Secondary | ICD-10-CM | POA: Diagnosis not present

## 2019-02-07 DIAGNOSIS — M545 Low back pain: Secondary | ICD-10-CM | POA: Diagnosis not present

## 2019-02-07 DIAGNOSIS — H532 Diplopia: Secondary | ICD-10-CM | POA: Diagnosis not present

## 2019-06-16 DIAGNOSIS — G4733 Obstructive sleep apnea (adult) (pediatric): Secondary | ICD-10-CM | POA: Diagnosis not present

## 2019-06-18 ENCOUNTER — Ambulatory Visit: Payer: PPO

## 2019-06-23 DIAGNOSIS — Z125 Encounter for screening for malignant neoplasm of prostate: Secondary | ICD-10-CM | POA: Diagnosis not present

## 2019-06-23 DIAGNOSIS — M109 Gout, unspecified: Secondary | ICD-10-CM | POA: Diagnosis not present

## 2019-06-23 DIAGNOSIS — E038 Other specified hypothyroidism: Secondary | ICD-10-CM | POA: Diagnosis not present

## 2019-06-23 DIAGNOSIS — E7849 Other hyperlipidemia: Secondary | ICD-10-CM | POA: Diagnosis not present

## 2019-06-23 DIAGNOSIS — R7301 Impaired fasting glucose: Secondary | ICD-10-CM | POA: Diagnosis not present

## 2019-06-30 DIAGNOSIS — R7301 Impaired fasting glucose: Secondary | ICD-10-CM | POA: Diagnosis not present

## 2019-06-30 DIAGNOSIS — R809 Proteinuria, unspecified: Secondary | ICD-10-CM | POA: Diagnosis not present

## 2019-06-30 DIAGNOSIS — F1120 Opioid dependence, uncomplicated: Secondary | ICD-10-CM | POA: Diagnosis not present

## 2019-06-30 DIAGNOSIS — Z1331 Encounter for screening for depression: Secondary | ICD-10-CM | POA: Diagnosis not present

## 2019-06-30 DIAGNOSIS — E039 Hypothyroidism, unspecified: Secondary | ICD-10-CM | POA: Diagnosis not present

## 2019-06-30 DIAGNOSIS — Z1339 Encounter for screening examination for other mental health and behavioral disorders: Secondary | ICD-10-CM | POA: Diagnosis not present

## 2019-06-30 DIAGNOSIS — K7290 Hepatic failure, unspecified without coma: Secondary | ICD-10-CM | POA: Diagnosis not present

## 2019-06-30 DIAGNOSIS — I480 Paroxysmal atrial fibrillation: Secondary | ICD-10-CM | POA: Diagnosis not present

## 2019-06-30 DIAGNOSIS — E785 Hyperlipidemia, unspecified: Secondary | ICD-10-CM | POA: Diagnosis not present

## 2019-06-30 DIAGNOSIS — G4733 Obstructive sleep apnea (adult) (pediatric): Secondary | ICD-10-CM | POA: Diagnosis not present

## 2019-06-30 DIAGNOSIS — M545 Low back pain: Secondary | ICD-10-CM | POA: Diagnosis not present

## 2019-06-30 DIAGNOSIS — E038 Other specified hypothyroidism: Secondary | ICD-10-CM | POA: Diagnosis not present

## 2019-06-30 DIAGNOSIS — I10 Essential (primary) hypertension: Secondary | ICD-10-CM | POA: Diagnosis not present

## 2019-06-30 DIAGNOSIS — R82998 Other abnormal findings in urine: Secondary | ICD-10-CM | POA: Diagnosis not present

## 2019-06-30 DIAGNOSIS — R269 Unspecified abnormalities of gait and mobility: Secondary | ICD-10-CM | POA: Diagnosis not present

## 2019-06-30 DIAGNOSIS — Z0000 Encounter for general adult medical examination without abnormal findings: Secondary | ICD-10-CM | POA: Diagnosis not present

## 2019-07-13 NOTE — Progress Notes (Signed)
Primary Physician/Referring:  Leanna Battles, MD  Patient ID: Carlos Hanson, male    DOB: 02-01-1941, 79 y.o.   MRN: [id number]  Chief Complaint  Patient presents with  . Atrial Fibrillation    follow up   HPI:    [name]  is a 79 y.o. Caucasian male  with  morbid obesity, hypertension, obstructive sleep apnea on CPAP and chronic back pain and has had back surgery in the past and on chronic pain meds.  He has chronic dyspnea on exertion, presently tolerating low-dose amiodarone and Xarelto and has maintained sinus rhythm. Presents here for annual visit.  Except for mild chronic dyspnea and also severe back pain that he has, he has no specific complaints today.  States that he is doing his best to be active, has not gained his weight greater than 300 pounds but he was previously.  Past Medical History:  Diagnosis Date  . Arthritis   . Chronic back pain   . Coronary artery disease    40% mLAD/DIAG bifurcation 03/25/10   . Dysrhythmia    History of atrial fibrillation  . Gout   . H/O hiatal hernia   . Hearing decreased    right ear  . History of bladder infections   . History of bleeding ulcers   . History of ulcer disease   . Hypercholesteremia   . Nocturia   . Obstructive sleep apnea hypopnea, severe   . Sleep apnea    cpap sleep study 2011   Past Surgical History:  Procedure Laterality Date  . BACK SURGERY    . CARDIAC CATHETERIZATION     2011  . cardioverson    . COLONOSCOPY  08/27/2011   Procedure: COLONOSCOPY;  Surgeon: Rogene Houston, MD;  [facility];  Service: Endoscopy;  Laterality: N/A;  930  . ESOPHAGOGASTRODUODENOSCOPY    . JOINT REPLACEMENT     left shoulder  . Left shoulder replacement    . LUMBAR LAMINECTOMY  03/15/2012   Procedure: LUMBAR LAMINECTOMY WITH  X-STOP 2 LEVEL;  Surgeon: Kristeen Miss, MD;  Location: [place] [facility];  Service: Neurosurgery;  Laterality: N/A;  Lumbar two-three, three-four XSTOP  . TONSILLECTOMY     Family  History  Problem Relation Age of Onset  . Heart failure Mother   . Diabetes Mother   . Arthritis Mother   . High blood pressure Mother   . Heart failure Father   . Colon cancer Neg Hx     Social History   Tobacco Use  . Smoking status: Former Smoker    Packs/day: 1.00    Years: 25.00    Pack years: 25.00    Types: Cigarettes    Quit date: 07/13/2005    Years since quitting: 14.0  . Smokeless tobacco: Never Used  . Tobacco comment: Quit 8 years ago.  Substance Use Topics  . Alcohol use: Yes    Comment: once a week   ROS  Review of Systems  Cardiovascular: Positive for dyspnea on exertion. Negative for chest pain and leg swelling.  Musculoskeletal: Positive for arthritis and back pain.  Gastrointestinal: Negative for melena.   Objective  Blood pressure 134/72, pulse 67, temperature 98.7 F (37.1 C), height 6' (1.829 m), weight 298 lb (135.2 kg).  Vitals with BMI 07/14/2019 01/26/2019 07/14/2018  Height 6\' 0"  5\' 11"  5\' 11"   Weight 298 lbs 297 lbs 300 lbs 3 oz  BMI 40.41 [id number]  Systolic Q000111Q Q000111Q [id number]  Diastolic  72 68 55  Pulse 67 59 55     Physical Exam  Constitutional: He appears well-developed. No distress.  Morbidly obese  Neck:  Short neck and difficult to evaluate JVP  Cardiovascular: Normal rate, regular rhythm and normal heart sounds. Frequent extrasystoles are present. Exam reveals no gallop.  No murmur heard. Pulses:      Carotid pulses are 2+ on the right side and 2+ on the left side.      Dorsalis pedis pulses are 2+ on the right side and 2+ on the left side.       Posterior tibial pulses are 2+ on the right side and 2+ on the left side.  Femoral and popliteal pulse difficult to feel due to patient's body habitus.  1+ bilateral pitting edema.  Chronic venous stasis dermatitis noted. JVD difficult to make out due to short neck.  Pulmonary/Chest: Effort normal and breath sounds normal.  Abdominal: Soft. Bowel sounds are normal.   Laboratory examination:    No results for input(s): NA, K, CL, CO2, GLUCOSE, BUN, CREATININE, CALCIUM, GFRNONAA, GFRAA in the last 8760 hours. CrCl cannot be calculated (Patient's most recent lab result is older than the maximum 21 days allowed.).  CMP Latest Ref Rng & Units 06/27/2018 03/11/2018 03/10/2018  Glucose 65 - 99 mg/dL 94 112(H) 107(H)  BUN 8 - 27 mg/dL 19 22 23   Creatinine 0.76 - 1.27 mg/dL 1.06 1.19 1.24  Sodium 134 - 144 mmol/L 138 134(L) 136  Potassium 3.5 - 5.2 mmol/L 4.8 3.8 3.9  Chloride 96 - 106 mmol/L 101 100 101  CO2 20 - 29 mmol/L 23 23 26   Calcium 8.6 - 10.2 mg/dL 9.0 8.8(L) 8.9  Total Protein 6.0 - 8.5 g/dL 6.2 - 6.2(L)  Total Bilirubin 0.0 - 1.2 mg/dL 0.3 - 1.0  Alkaline Phos 39 - 117 IU/L 93 - 51  AST 0 - 40 IU/L 12 - 25  ALT 0 - 44 IU/L 20 - 22   CBC Latest Ref Rng & Units 06/27/2018 03/11/2018 03/10/2018  WBC 3.4 - 10.8 x10E3/uL 8.3 11.0(H) 10.1  Hemoglobin 13.0 - 17.7 g/dL 14.1 10.5(L) 10.5(L)  Hematocrit 37.5 - 51.0 % 43.2 31.9(L) 33.1(L)  Platelets 150 - 450 x10E3/uL 351 380 401(H)   External labs:   Cholesterol, total 103.000 m 06/23/2019 HDL 51 MG/DL 06/23/2019 LDL 40.000 mg 06/23/2019 Triglycerides 61.000 06/23/2019  Hemoglobin 14.000 g/ 06/23/2019, PLT 350.  Creatinine, Serum 1.000 mg/ 06/23/2019 Potassium 4.800 06/27/2018 ALT (SGPT) 38.000 uni 06/23/2019  A1C 5.500 % 06/23/2019; TSH 0.000 06/30/2019, free T4- 1.5, normal.  Medications and allergies   Allergies  Allergen Reactions  . Naprosyn [Naproxen] Rash     Current Outpatient Medications  Medication Instructions  . acetaminophen (TYLENOL) 1,000 mg, Oral, Every 8 hours  . allopurinol (ZYLOPRIM) 300 mg, Oral, Daily  . amiodarone (PACERONE) 100 mg, Oral, Daily  . atorvastatin (LIPITOR) 20 MG tablet 1 tablet, Oral, Daily  . desmopressin (DDAVP) 0.2 mg, Oral, Daily, 3 caps in the a.m.  Marland Kitchen diltiazem (DILACOR XR) 120 mg, Oral, Daily  . DULoxetine (CYMBALTA) 90 mg, Oral, Daily  . gabapentin (NEURONTIN) 100 mg, Oral, 2  times daily  . lactulose (CHRONULAC) 20 g, Oral, Daily  . levothyroxine (SYNTHROID) 50 mcg, Oral, Daily before breakfast  . morphine (MS CONTIN) 15 mg, Oral, Every 12 hours  . Multiple Vitamins-Minerals (VISION FORMULA EYE HEALTH) CAPS 1 tablet, Oral, 2 times daily, [employer]  . oxyCODONE (OXY IR/ROXICODONE) 5 mg, Oral, 3 times daily  PRN  . oxymetazoline (AFRIN) 0.05 % nasal spray 1 spray, Each Nare, At bedtime PRN  . pantoprazole (PROTONIX) 40 mg, 2 times daily  . rivaroxaban (XARELTO) 20 mg, Oral, Daily with supper  . triamterene-hydrochlorothiazide (MAXZIDE-25) 37.5-25 MG tablet 0.5 tablets, Oral, 3 times weekly, monday wednesday friday  . zolpidem (AMBIEN) 10 mg, Oral, At bedtime PRN   Radiology:   No results found.  Cardiac Studies:   Echocardiogram 03/26/2010: Left ventricle: The cavity size was normal. There was focal basal and moderate concentric hypertrophy. Systolic function was normal. The estimated ejection fraction was in the range of 60% to 65%. Doppler parameters are consistent with elevated mean left atrial filling pressure.- Ventricular septum: The contour showed systolic flattening. - Left atrium: The atrium was moderately dilated. - Right ventricle: The cavity size was mildly dilated.  Coronary angiogram 03/25/10: Normal LV. Slow flow in the  coronary arteries.  Cardioversion 05/01/10: Failed. transient sinus and back to A. Fib  Sleep Study 07/09/2014 Asencion Partridge Dohmeier, MD): Positive for OSA, pt uses CPAP.  EKG  EKG 07/14/2019: Sinus rhythm with first-degree AV block at rate of 61 bpm, left axis deviation, left anterior fascicular block.  Right bundle branch block.  Trifascicular block.   Clear 150 compared to 01/26/2019, frequent PVCs in bigeminal pattern not present.  Assessment     ICD-10-CM   1. Paroxysmal atrial fibrillation ([place]). CHA2DS2VASc score 3, annual stroke risk 3.2% (A, HTN).  I48.0 EKG 12-Lead  2. 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 OSA on CPAP  G47.33    Z99.89   5. Morbid obesity ([place])  E66.01      No orders of the defined types were placed in this encounter.   There are no discontinued medications.  Recommendations:   HOLDAN TIPLER  is a 79 y.o. Caucasian male  with  morbid obesity, hypertension, obstructive sleep apnea on CPAP and chronic back pain and has had back surgery in the past and on chronic pain meds.  He has chronic dyspnea on exertion, presently tolerating low-dose amiodarone and Xarelto and has maintained sinus rhythm. Presents here for annual visit.  He is presently doing well, tolerating anticoagulation without any bleeding diathesis.  He had an accidental fall yesterday but fortunately did not hurt himself much.  Fall precautions discussed.  Blood pressures well controlled, his lipids are also well controlled and CBC is normal.  I reviewed his external records.  EKG reveals trifascicular block, but he is asymptomatic and is tolerating low-dose amiodarone.  He is extremely symptomatic when he does indeed go into atrial fibrillation hence we will continue the same for now and I will see him back in a year for follow-up.  He has had TSH that was 0.00, however free T4 was normal.  He remains asymptomatic.  Adrian Prows, MD, [facility] 07/14/2019, 2:32 PM [facility] [place] Office: [phone]

## 2019-07-14 ENCOUNTER — Ambulatory Visit: Payer: PPO | Admitting: Cardiology

## 2019-07-14 ENCOUNTER — Other Ambulatory Visit: Payer: Self-pay

## 2019-07-14 ENCOUNTER — Encounter: Payer: Self-pay | Admitting: Cardiology

## 2019-07-14 VITALS — BP 134/72 | HR 67 | Temp 98.7°F | Ht 72.0 in | Wt 298.0 lb

## 2019-07-14 DIAGNOSIS — Z9989 Dependence on other enabling machines and devices: Secondary | ICD-10-CM | POA: Diagnosis not present

## 2019-07-14 DIAGNOSIS — I480 Paroxysmal atrial fibrillation: Secondary | ICD-10-CM

## 2019-07-14 DIAGNOSIS — I10 Essential (primary) hypertension: Secondary | ICD-10-CM | POA: Diagnosis not present

## 2019-07-14 DIAGNOSIS — G4733 Obstructive sleep apnea (adult) (pediatric): Secondary | ICD-10-CM | POA: Diagnosis not present

## 2019-07-14 DIAGNOSIS — I453 Trifascicular block: Secondary | ICD-10-CM | POA: Diagnosis not present

## 2019-07-28 DIAGNOSIS — E038 Other specified hypothyroidism: Secondary | ICD-10-CM | POA: Diagnosis not present

## 2019-07-28 DIAGNOSIS — R5383 Other fatigue: Secondary | ICD-10-CM | POA: Diagnosis not present

## 2019-08-10 ENCOUNTER — Other Ambulatory Visit: Payer: Self-pay

## 2019-08-10 ENCOUNTER — Telehealth: Payer: Self-pay

## 2019-08-10 MED ORDER — RIVAROXABAN 20 MG PO TABS: 20.0000 mg | ORAL_TABLET | Freq: Every day | ORAL | 3 refills | Status: DC

## 2019-10-11 DIAGNOSIS — M25511 Pain in right shoulder: Secondary | ICD-10-CM | POA: Diagnosis not present

## 2019-10-11 DIAGNOSIS — S42301D Unspecified fracture of shaft of humerus, right arm, subsequent encounter for fracture with routine healing: Secondary | ICD-10-CM | POA: Diagnosis not present

## 2019-10-19 ENCOUNTER — Ambulatory Visit: Payer: Self-pay

## 2019-10-19 ENCOUNTER — Ambulatory Visit: Payer: PPO | Admitting: Orthopaedic Surgery

## 2019-10-19 ENCOUNTER — Encounter: Payer: Self-pay | Admitting: Orthopaedic Surgery

## 2019-10-19 VITALS — Ht 72.0 in

## 2019-10-19 DIAGNOSIS — M79601 Pain in right arm: Secondary | ICD-10-CM

## 2019-10-19 NOTE — Progress Notes (Signed)
Subjective: Patient is here for ultrasound-guided intra-articular right glenohumeral injection.  Ongoing pain status post fracture.  Objective: Diminished range of motion with overhead reach and behind the back reach.  Procedure: Ultrasound-guided right glenohumeral injection: After sterile prep with Betadine, injected 8 cc 1% lidocaine without epinephrine and 40 mg methylprednisolone using a 22-gauge spinal needle, passing the needle from posterior approach into the glenohumeral joint.  Injectate was seen filling the joint capsule.  A flash of clear yellow synovial fluid was obtained prior to injection.  He had modest improvement during the anesthetic phase.  Follow-up as directed.

## 2019-10-19 NOTE — Progress Notes (Signed)
Office Visit Note   Patient: Carlos Hanson           Date of Birth: [birth year]           MRN: [id number] Visit Date: 10/19/2019              Requested by: Leanna Battles, MD [address] [place],  [place] [address] PCP: Leanna Battles, MD   Assessment & Plan: Visit Diagnoses:  1. 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 Based on discussion of treatment options he agreed to undergo a glenohumeral injection of cortisone today. He tolerated this well. We will see him back as needed.  Follow-Up Instructions: Return if symptoms worsen or fail to improve.   Orders:  Orders Placed This Encounter  Procedures  . XR Humerus Right  . US Guided Needle Placement - No Linked Charges   No orders of the defined types were placed in this encounter.     Procedures: No procedures performed   Clinical Data: No additional findings.   Subjective: Chief Complaint  Patient presents with  . Right Arm - Pain    Carlos Hanson is a 79 year old who comes in for right shoulder pain and decreasing range of motion. He was originally treated about a year and a half ago for a proximal humerus fracture that was treated nonoperatively. He states that he has had limitation range of motion and shoulder pain with activity especially raising his arm above his head. Denies any numbness and tingling or radicular symptoms.   Review of Systems  Constitutional: Negative.   All other systems reviewed and are negative.    Objective: Vital Signs: Ht 6' (1.829 m)   BMI 40.42 kg/m   Physical Exam Vitals and nursing note reviewed.  Constitutional:      Appearance: He is well-developed.  HENT:     Head: Normocephalic and atraumatic.  Eyes:     Pupils: Pupils are equal, round, and reactive to light.  Pulmonary:     Effort: Pulmonary effort is normal.  Abdominal:     Palpations: Abdomen is soft.  Musculoskeletal:          General: Normal range of motion.     Cervical back: Neck supple.  Skin:    General: Skin is warm.  Neurological:     Mental Status: He is alert and oriented to person, place, and time.  Psychiatric:        Behavior: Behavior normal.        Thought Content: Thought content normal.        Judgment: Judgment normal.     Ortho Exam Right shoulder shows mild to moderate limitation for flexion external rotation and internal rotation. Manual muscle testing is slightly decreased secondary to pain. Specialty Comments:  No specialty comments available.  Imaging: US Guided Needle Placement - No Linked Charges  Result Date: 10/19/2019 Ultrasound-guided right glenohumeral injection: After sterile prep with Betadine, injected 8 cc 1% lidocaine without epinephrine and 40 mg methylprednisolone using a 22-gauge spinal needle, passing the needle from posterior approach into the glenohumeral joint.  Injectate was seen filling the joint capsule.  A flash of clear yellow synovial fluid was obtained prior to injection.  He had modest improvement during the anesthetic phase.  Follow-up as directed.   XR Humerus Right  Result Date: 10/19/2019 Healed proximal humerus fracture in varus.     PMFS History: Patient Active Problem List  Diagnosis Date Noted  . Essential hypertension 07/14/2018  . Morbid obesity due to excess calories ([place]) 05/16/2015  . Chronic pain syndrome 05/16/2015  . OSA on CPAP 05/16/2015  . RLS (restless legs syndrome) 07/09/2014  . Paroxysmal atrial fibrillation ([place]) 07/09/2014  . Obesity hypoventilation syndrome ([place]) 07/09/2014  . OSA treated with BiPAP 07/09/2014  . UTI (lower urinary tract infection) 06/29/2014    Class: Acute  . Obstructive sleep apnea hypopnea, severe 06/29/2014    Class: Chronic   Past Medical History:  Diagnosis Date  . Arthritis   . Chronic back pain   . Coronary artery disease    40% mLAD/DIAG bifurcation 03/25/10   . Dysrhythmia     History of atrial fibrillation  . Gout   . H/O hiatal hernia   . Hearing decreased    right ear  . History of bladder infections   . History of bleeding ulcers   . History of ulcer disease   . Hypercholesteremia   . Nocturia   . Obstructive sleep apnea hypopnea, severe   . Sleep apnea    cpap sleep study 2011    Family History  Problem Relation Age of Onset  . Heart failure Mother   . Diabetes Mother   . Arthritis Mother   . High blood pressure Mother   . Heart failure Father   . Colon cancer Neg Hx     Past Surgical History:  Procedure Laterality Date  . BACK SURGERY    . CARDIAC CATHETERIZATION     2011  . cardioverson    . COLONOSCOPY  08/27/2011   Procedure: COLONOSCOPY;  Surgeon: Rogene Houston, MD;  [facility];  Service: Endoscopy;  Laterality: N/A;  930  . ESOPHAGOGASTRODUODENOSCOPY    . JOINT REPLACEMENT     left shoulder  . Left shoulder replacement    . LUMBAR LAMINECTOMY  03/15/2012   Procedure: LUMBAR LAMINECTOMY WITH  X-STOP 2 LEVEL;  Surgeon: Kristeen Miss, MD;  Location: [facility];  Service: Neurosurgery;  Laterality: N/A;  Lumbar two-three, three-four XSTOP  . TONSILLECTOMY     Social History   Occupational History    Comment: Retired  Tobacco Use  . Smoking status: Former Smoker    Packs/day: 1.00    Years: 25.00    Pack years: 25.00    Types: Cigarettes    Quit date: 07/13/2005    Years since quitting: 14.2  . Smokeless tobacco: Never Used  . Tobacco comment: Quit 8 years ago.  Substance and Sexual Activity  . Alcohol use: Yes    Comment: once a week  . Drug use: No  . Sexual activity: Not on file

## 2019-12-28 DIAGNOSIS — E039 Hypothyroidism, unspecified: Secondary | ICD-10-CM | POA: Diagnosis not present

## 2019-12-28 DIAGNOSIS — R7301 Impaired fasting glucose: Secondary | ICD-10-CM | POA: Diagnosis not present

## 2019-12-28 DIAGNOSIS — F1120 Opioid dependence, uncomplicated: Secondary | ICD-10-CM | POA: Diagnosis not present

## 2019-12-28 DIAGNOSIS — G4733 Obstructive sleep apnea (adult) (pediatric): Secondary | ICD-10-CM | POA: Diagnosis not present

## 2019-12-28 DIAGNOSIS — R42 Dizziness and giddiness: Secondary | ICD-10-CM | POA: Diagnosis not present

## 2019-12-28 DIAGNOSIS — E785 Hyperlipidemia, unspecified: Secondary | ICD-10-CM | POA: Diagnosis not present

## 2019-12-28 DIAGNOSIS — I480 Paroxysmal atrial fibrillation: Secondary | ICD-10-CM | POA: Diagnosis not present

## 2019-12-28 DIAGNOSIS — I10 Essential (primary) hypertension: Secondary | ICD-10-CM | POA: Diagnosis not present

## 2019-12-28 DIAGNOSIS — M545 Low back pain: Secondary | ICD-10-CM | POA: Diagnosis not present

## 2020-01-13 ENCOUNTER — Other Ambulatory Visit: Payer: Self-pay | Admitting: Cardiology

## 2020-01-15 NOTE — Telephone Encounter (Signed)
Refill request

## 2020-01-19 IMAGING — MR MR HEAD W/O CM
9 of 11 series · 31 of 48 positions shown · non-contrast
Comparison: 02/06/2018 CT head.

CLINICAL DATA: 77 y/o M; fall 5 days prior. Decreased mobility and
subsequent weakness.

EXAM:
MRI HEAD WITHOUT CONTRAST
TECHNIQUE: Multiplanar, multiecho pulse sequences of the brain and surrounding
structures were obtained without intravenous contrast.

[Series 3: DWI · axial · 3.0mm · 1.02mm/px · z∈[-64,+94]mm · 7 of 108 slices shown (1 of 2)]
[im 1/108]
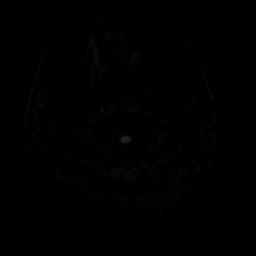
[im 18/108]
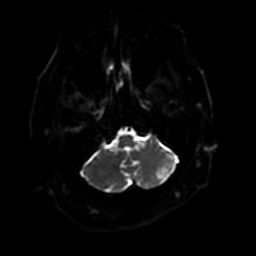
[im 36/108]
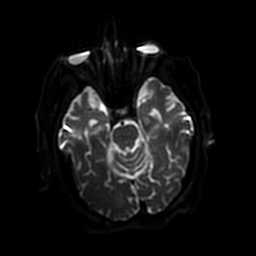
[im 54/108]
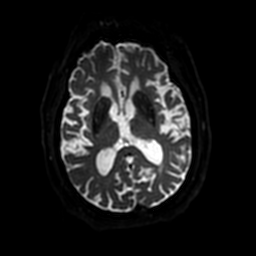
[im 72/108]
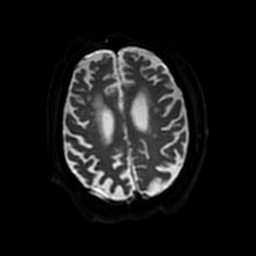
[im 90/108]
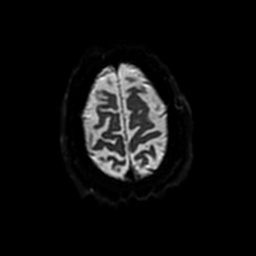
[im 108/108]
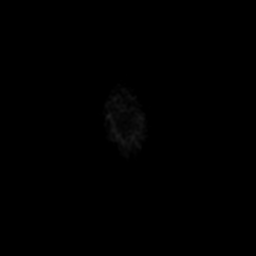

[Series 4: DWI · coronal · 5.0mm · 1.02mm/px · 5 of 74 slices shown (2 of 2)]
[im 1/74]
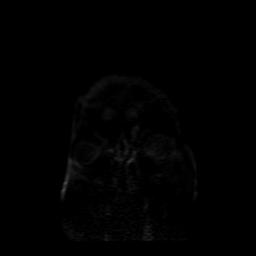
[im 19/74]
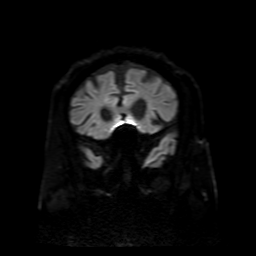
[im 37/74]
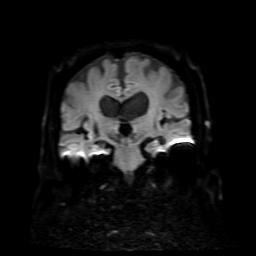
[im 55/74]
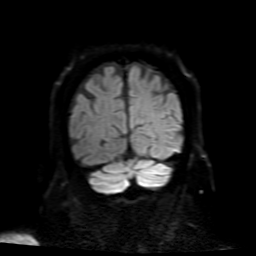
[im 74/74]
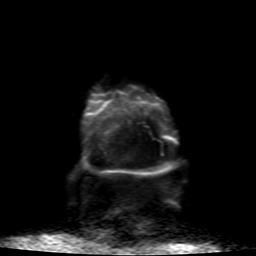

[Series 5: FLAIR · sagittal · 5.0mm · 0.47mm/px · 2 of 29 slices shown (1 of 2)]
[im 1/29]
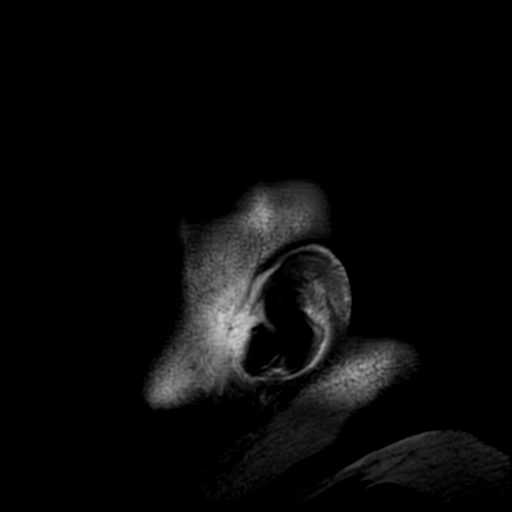
[im 29/29]
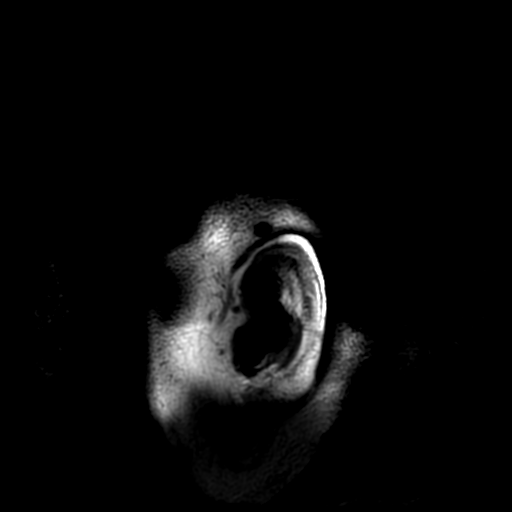

[Series 6: T2 · axial · 5.0mm · 0.47mm/px · z∈[-66,+89]mm · 2 of 27 slices shown (1 of 2)]
[im 1/27]
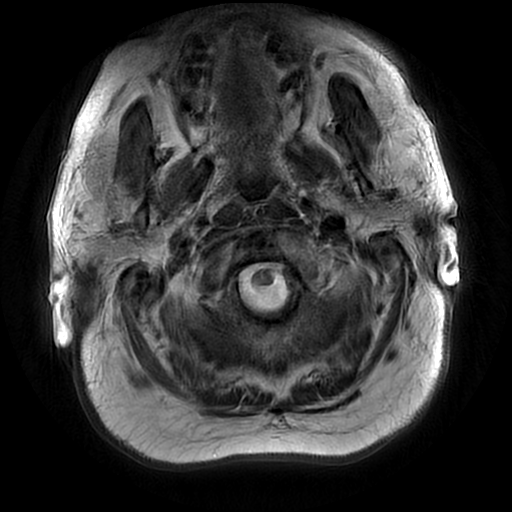
[im 27/27]
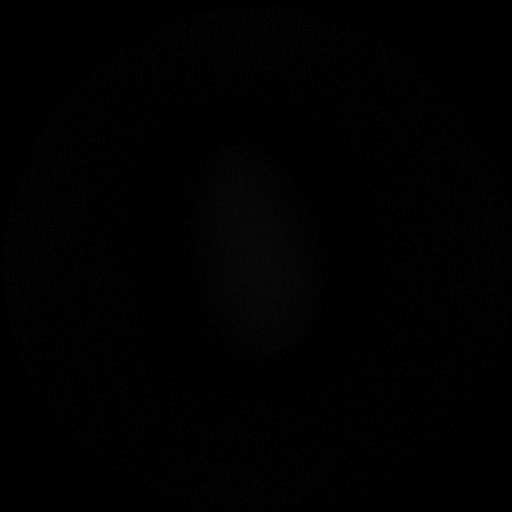

[Series 7: FLAIR · axial · 3.0mm · 0.43mm/px · z∈[-68,+87]mm · 2 of 27 slices shown (2 of 2)]
[im 1/27]
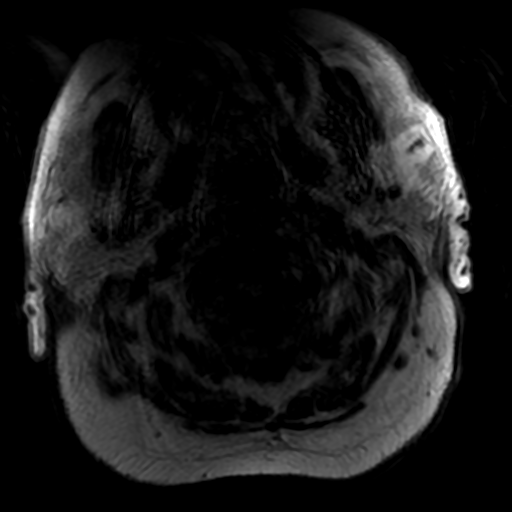
[im 27/27]
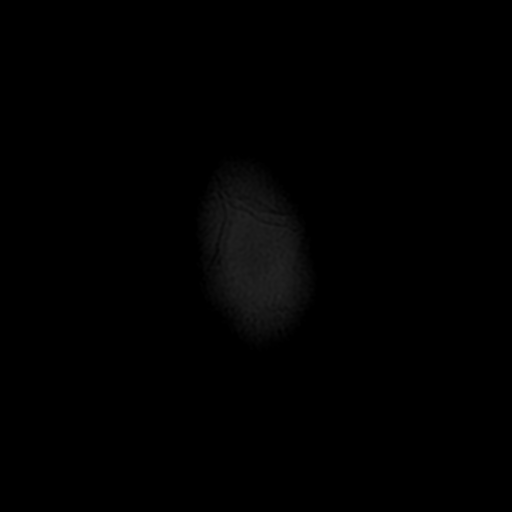

[Series 8: (person_name) · axial · 3.0mm · 0.47mm/px · z∈[-73,+33]mm · 5 of 108 slices shown]
[im 1/108]
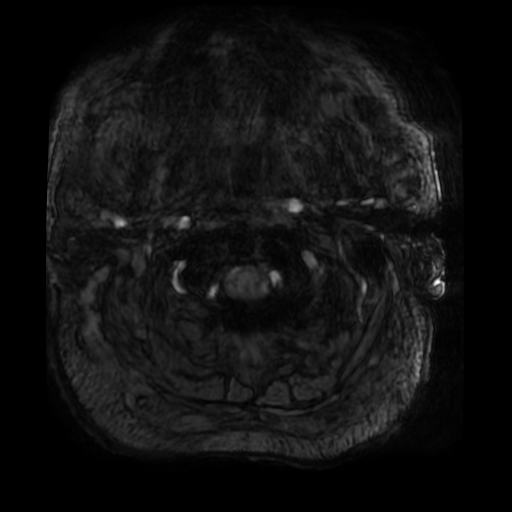
[im 18/108]
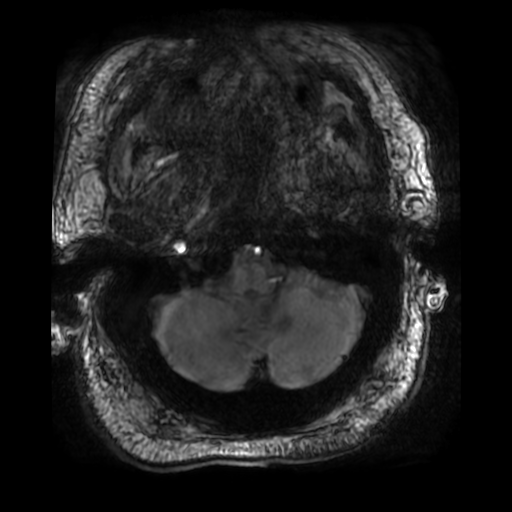
[im 36/108]
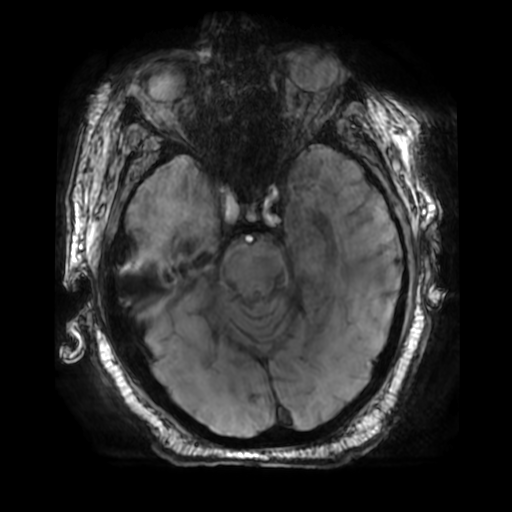
[im 54/108]
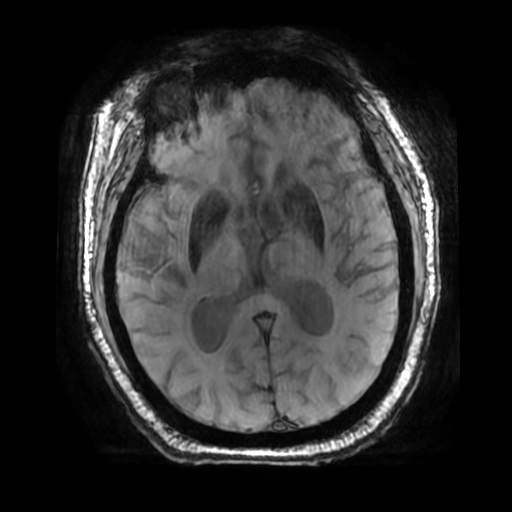
[im 72/108]
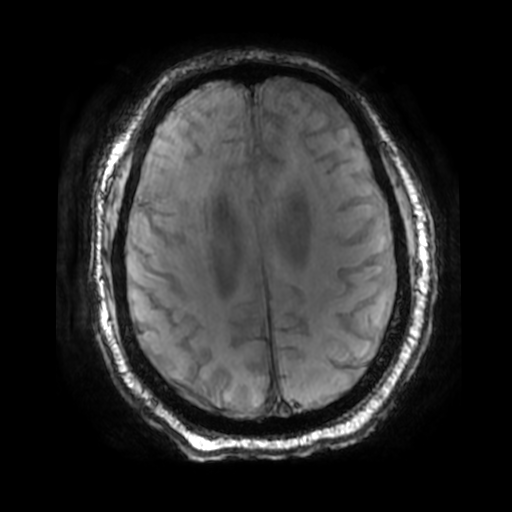

[Series 10: T2 · coronal · 5.0mm · 0.39mm/px · 3 of 38 slices shown (2 of 2)]
[im 1/38]
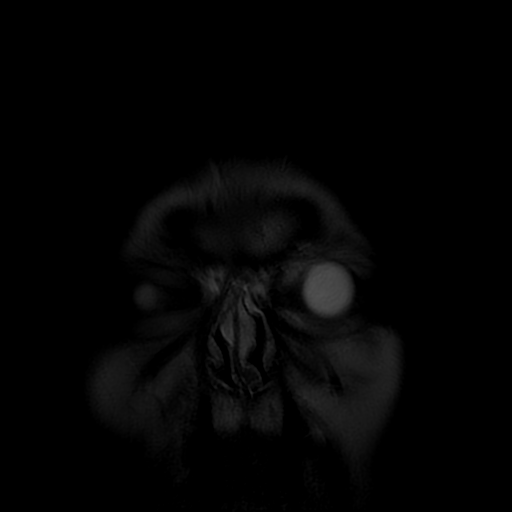
[im 19/38]
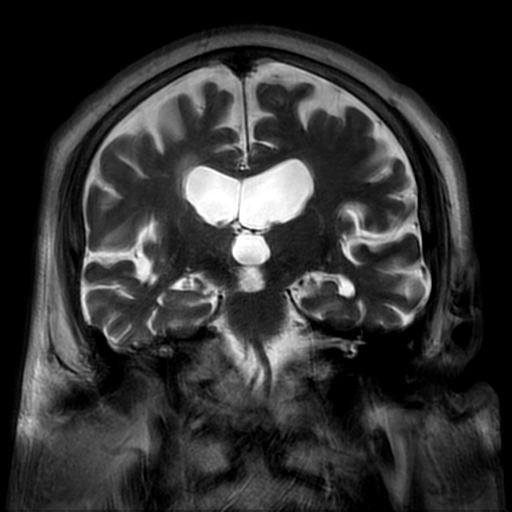
[im 38/38]
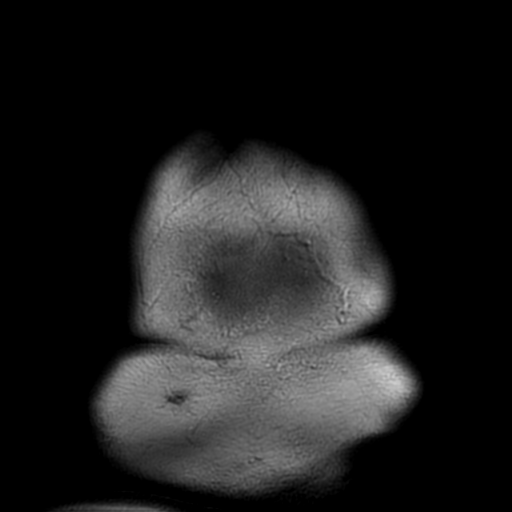

[Series 350: ADC · axial · 3.0mm · 1.02mm/px · z∈[-64,+94]mm · 3 of 53 slices shown (1 of 2)]
[im 1/53]
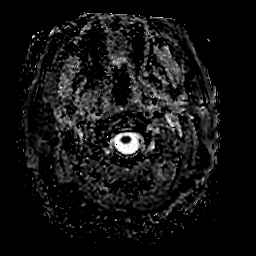
[im 27/53]
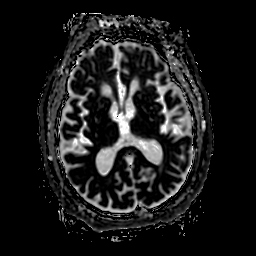
[im 53/53]
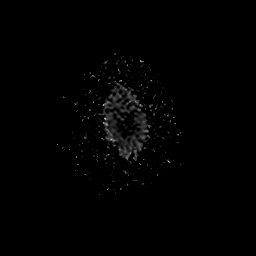

[Series 450: ADC · coronal · 5.0mm · 1.02mm/px · 2 of 36 slices shown (2 of 2)]
[im 1/36]
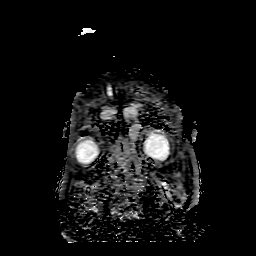
[im 36/36]
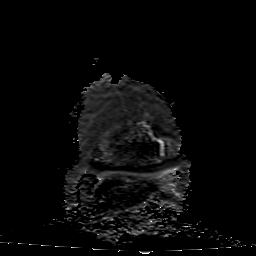

[31 of 48 positions shown; findings below may reference images not displayed]

FINDINGS: Brain: Motion degradation of several sequences. No acute infarction,
hemorrhage, hydrocephalus, extra-axial collection or mass lesion.
Scattered nonspecific T2 hyperintensities in subcortical and
periventricular white matter are compatible with moderate chronic
microvascular ischemic changes for age. Moderate to severe volume
loss of the brain. Motion degraded T2 FLAIR sequence.

Vascular: Normal flow voids.

Skull and upper cervical spine: Normal marrow signal.

Sinuses/Orbits: No acute finding. Small left maxillary sinus mucous
retention cyst and mild mucosal thickening of the ethmoid air cells.

Other: Bilateral intra-ocular lens replacement.
IMPRESSION: 1. Motion degraded study.
2. No acute intracranial abnormality identified.
3. Moderate chronic microvascular ischemic changes and moderate to
severe volume loss of the brain.

## 2020-05-23 DIAGNOSIS — G4733 Obstructive sleep apnea (adult) (pediatric): Secondary | ICD-10-CM | POA: Diagnosis not present

## 2020-07-01 DIAGNOSIS — I10 Essential (primary) hypertension: Secondary | ICD-10-CM | POA: Diagnosis not present

## 2020-07-01 DIAGNOSIS — M109 Gout, unspecified: Secondary | ICD-10-CM | POA: Diagnosis not present

## 2020-07-01 DIAGNOSIS — G4733 Obstructive sleep apnea (adult) (pediatric): Secondary | ICD-10-CM | POA: Diagnosis not present

## 2020-07-01 DIAGNOSIS — G8929 Other chronic pain: Secondary | ICD-10-CM | POA: Diagnosis not present

## 2020-07-01 DIAGNOSIS — H9193 Unspecified hearing loss, bilateral: Secondary | ICD-10-CM | POA: Diagnosis not present

## 2020-07-01 DIAGNOSIS — M545 Low back pain, unspecified: Secondary | ICD-10-CM | POA: Diagnosis not present

## 2020-07-01 DIAGNOSIS — R7301 Impaired fasting glucose: Secondary | ICD-10-CM | POA: Diagnosis not present

## 2020-07-01 DIAGNOSIS — E785 Hyperlipidemia, unspecified: Secondary | ICD-10-CM | POA: Diagnosis not present

## 2020-07-01 DIAGNOSIS — I480 Paroxysmal atrial fibrillation: Secondary | ICD-10-CM | POA: Diagnosis not present

## 2020-07-01 DIAGNOSIS — R42 Dizziness and giddiness: Secondary | ICD-10-CM | POA: Diagnosis not present

## 2020-07-01 DIAGNOSIS — E039 Hypothyroidism, unspecified: Secondary | ICD-10-CM | POA: Diagnosis not present

## 2020-07-05 ENCOUNTER — Other Ambulatory Visit: Payer: Self-pay | Admitting: Cardiology

## 2020-07-15 ENCOUNTER — Other Ambulatory Visit: Payer: Self-pay

## 2020-07-15 ENCOUNTER — Ambulatory Visit: Payer: PPO | Admitting: Cardiology

## 2020-07-19 ENCOUNTER — Ambulatory Visit: Payer: PPO | Admitting: Cardiology

## 2020-07-19 ENCOUNTER — Other Ambulatory Visit: Payer: Self-pay

## 2020-07-19 ENCOUNTER — Encounter: Payer: Self-pay | Admitting: Cardiology

## 2020-07-19 VITALS — BP 127/66 | HR 62 | Temp 97.6°F | Resp 16 | Ht 72.0 in | Wt 314.0 lb

## 2020-07-19 DIAGNOSIS — I10 Essential (primary) hypertension: Secondary | ICD-10-CM

## 2020-07-19 DIAGNOSIS — I453 Trifascicular block: Secondary | ICD-10-CM

## 2020-07-19 DIAGNOSIS — Z9989 Dependence on other enabling machines and devices: Secondary | ICD-10-CM | POA: Diagnosis not present

## 2020-07-19 DIAGNOSIS — Z5181 Encounter for therapeutic drug level monitoring: Secondary | ICD-10-CM | POA: Diagnosis not present

## 2020-07-19 DIAGNOSIS — G4733 Obstructive sleep apnea (adult) (pediatric): Secondary | ICD-10-CM

## 2020-07-19 DIAGNOSIS — E7800 Pure hypercholesterolemia, unspecified: Secondary | ICD-10-CM | POA: Diagnosis not present

## 2020-07-19 DIAGNOSIS — I480 Paroxysmal atrial fibrillation: Secondary | ICD-10-CM

## 2020-07-19 NOTE — Progress Notes (Signed)
Primary Physician/Referring:  Leanna Battles, MD  Patient ID: Carlos Hanson, male    DOB: 14-Mar-1941, 80 y.o.   MRN: [id number]  Chief Complaint  Patient presents with  . Atrial Fibrillation  . Abnormal ECG  . Follow-up    1 year   HPI:    Carlos Hanson  is a 80 y.o. Caucasian male  with  morbid obesity, hypertension, obstructive sleep apnea on CPAP and chronic back pain and has had back surgery in the past and on chronic pain meds.  He has chronic dyspnea on exertion, presently tolerating low-dose amiodarone and Xarelto and has maintained sinus rhythm. Presents here for annual visit.  Except for mild chronic dyspnea and also severe back pain, he has no specific complaints today.  He is looking forward for the summer so he can start exercising in his pool at home.  Past Medical History:  Diagnosis Date  . Arthritis   . Chronic back pain   . Coronary artery disease    40% mLAD/DIAG bifurcation 03/25/10   . Dysrhythmia    History of atrial fibrillation  . Gout   . H/O hiatal hernia   . Hearing decreased    right ear  . History of bladder infections   . History of bleeding ulcers   . History of ulcer disease   . Hypercholesteremia   . Nocturia   . Obstructive sleep apnea hypopnea, severe   . Sleep apnea    cpap sleep study 2011   Past Surgical History:  Procedure Laterality Date  . BACK SURGERY    . CARDIAC CATHETERIZATION     2011  . cardioverson    . COLONOSCOPY  08/27/2011   Procedure: COLONOSCOPY;  Surgeon: Rogene Houston, MD;  [facility];  Service: Endoscopy;  Laterality: N/A;  930  . ESOPHAGOGASTRODUODENOSCOPY    . JOINT REPLACEMENT     left shoulder  . Left shoulder replacement    . LUMBAR LAMINECTOMY  03/15/2012   Procedure: LUMBAR LAMINECTOMY WITH  X-STOP 2 LEVEL;  Surgeon: Kristeen Miss, MD;  Location: [facility];  Service: Neurosurgery;  Laterality: N/A;  Lumbar two-three, three-four XSTOP  . TONSILLECTOMY     Family History  Problem  Relation Age of Onset  . Heart failure Mother   . Diabetes Mother   . Arthritis Mother   . High blood pressure Mother   . Heart failure Father   . Colon cancer Neg Hx     Social History   Tobacco Use  . Smoking status: Former Smoker    Packs/day: 1.00    Years: 25.00    Pack years: 25.00    Types: Cigarettes    Quit date: 07/13/2005    Years since quitting: 15.0  . Smokeless tobacco: Former Systems developer    Types: Chew  . Tobacco comment: Quit 8 years ago.  Substance Use Topics  . Alcohol use: Yes    Comment: once a week   ROS  Review of Systems  Cardiovascular: Positive for dyspnea on exertion. Negative for chest pain and leg swelling.  Musculoskeletal: Positive for arthritis and back pain.  Gastrointestinal: Negative for melena.   Objective  Blood pressure 127/66, pulse 62, temperature 97.6 F (36.4 C), temperature source Temporal, resp. rate 16, height 6' (1.829 m), weight (!) 314 lb (142.4 kg), SpO2 96 %.  Vitals with BMI 07/19/2020 10/19/2019 07/14/2019  Height 6' 0" 6' 0" 6' 0"  Weight 314 lbs - 298 lbs  BMI  77.11 - 65.79  Systolic 038 - 333  Diastolic 66 - 72  Pulse 62 - 67     Physical Exam Constitutional:      General: He is not in acute distress.    Appearance: He is well-developed.     Comments: Morbidly obese  Neck:     Comments: Short neck and difficult to evaluate JVP Cardiovascular:     Rate and Rhythm: Normal rate and regular rhythm. Frequent extrasystoles are present.    Pulses:          Carotid pulses are 2+ on the right side and 2+ on the left side.      Dorsalis pedis pulses are 2+ on the right side and 2+ on the left side.       Posterior tibial pulses are 2+ on the right side and 2+ on the left side.     Heart sounds: Normal heart sounds. No murmur heard. No gallop.      Comments: Femoral and popliteal pulse difficult to feel due to patient's body habitus.  1+ bilateral pitting edema.  Chronic venous stasis dermatitis noted. JVD difficult to make  out due to short neck. Pulmonary:     Effort: Pulmonary effort is normal.     Breath sounds: Normal breath sounds.  Abdominal:     General: Bowel sounds are normal.     Palpations: Abdomen is soft.    Laboratory examination:   No results for input(s): NA, K, CL, CO2, GLUCOSE, BUN, CREATININE, CALCIUM, GFRNONAA, GFRAA in the last 8760 hours. CrCl cannot be calculated (Patient's most recent lab result is older than the maximum 21 days allowed.).  CMP Latest Ref Rng & Units 06/27/2018 03/11/2018 03/10/2018  Glucose 65 - 99 mg/dL 94 112(H) 107(H)  BUN 8 - 27 mg/dL _0 Creatinine 0.76 - 1.27 mg/dL 1.06 1.19 1.24  Sodium 134 - 144 mmol/L 138 134(L) 136  Potassium 3.5 - 5.2 mmol/L 4.8 3.8 3.9  Chloride 96 - 106 mmol/L 101 100 101  CO2 20 - 29 mmol/L _1 Calcium 8.6 - 10.2 mg/dL 9.0 8.8(L) 8.9  Total Protein 6.0 - 8.5 g/dL 6.2 - 6.2(L)  Total Bilirubin 0.0 - 1.2 mg/dL 0.3 - 1.0  Alkaline Phos 39 - 117 IU/L 93 - 51  AST 0 - 40 IU/L 12 - 25  ALT 0 - 44 IU/L 20 - 22   CBC Latest Ref Rng & Units 06/27/2018 03/11/2018 03/10/2018  WBC 3.4 - 10.8 x10E3/uL 8.3 11.0(H) 10.1  Hemoglobin 13.0 - 17.7 g/dL 14.1 10.5(L) 10.5(L)  Hematocrit 37.5 - 51.0 % 43.2 31.9(L) 33.1(L)  Platelets 150 - 450 x10E3/uL 351 380 401(H)   External labs:     Labs 07/01/2020:  A1c 5.7%.  Cholesterol, total 103.000 m 06/23/2019 HDL 51 MG/DL 06/23/2019 LDL 40.000 mg 06/23/2019 Triglycerides 61.000 06/23/2019  Hemoglobin 14.000 g/ 06/23/2019, PLT 350.  Creatinine, Serum 1.000 mg/ 06/23/2019 Potassium 4.800 06/27/2018 ALT (SGPT) 38.000 uni 06/23/2019  A1C 5.500 % 06/23/2019; TSH 0.000 06/30/2019, free T4- 1.5, normal.  Medications and allergies   Allergies  Allergen Reactions  . Naprosyn [Naproxen] Rash    Current Outpatient Medications on File Prior to Visit  Medication Sig Dispense Refill  . acetaminophen (TYLENOL) 500 MG tablet Take 2 tablets (1,000 mg total) by mouth every 8 (eight) hours. 30 tablet  0  . amiodarone (PACERONE) 100 MG tablet TAKE 1 TABLET BY MOUTH EVERY DAY 90 tablet 3  . atorvastatin (LIPITOR) 20  MG tablet Take 1 tablet by mouth daily.    Marland Kitchen desmopressin (DDAVP) 0.2 MG tablet Take 0.2 mg by mouth daily. 3 caps in the a.m.  1  . diltiazem (DILACOR XR) 120 MG 24 hr capsule Take 120 mg by mouth daily.    . DULoxetine (CYMBALTA) 30 MG capsule Take 3 capsules (90 mg total) by mouth daily. 6 capsule 0  . gabapentin (NEURONTIN) 100 MG capsule Take 1 capsule (100 mg total) by mouth 2 (two) times daily. 6 capsule 0  . lactulose (CHRONULAC) 10 GM/15ML solution Take 30 mLs (20 g total) by mouth daily. 240 mL 1  . levothyroxine (SYNTHROID) 50 MCG tablet Take 50 mcg by mouth daily before breakfast.    . Multiple Vitamins-Minerals (VISION FORMULA EYE HEALTH) CAPS Take 1 tablet by mouth 2 (two) times daily. [employer]    . oxyCODONE (OXY IR/ROXICODONE) 5 MG immediate release tablet Take 1 tablet (5 mg total) by mouth 3 (three) times daily as needed for moderate pain or severe pain. 8 tablet 0  . oxymetazoline (AFRIN) 0.05 % nasal spray Place 1 spray into both nostrils at bedtime as needed for congestion (sinuses).    . pantoprazole (PROTONIX) 40 MG tablet Take 40 mg by mouth 2 (two) times daily.    Marland Kitchen triamterene-hydrochlorothiazide (MAXZIDE-25) 37.5-25 MG tablet Take 0.5 tablets by mouth 3 (three) times a week. monday wednesday friday    . XARELTO 20 MG TABS tablet TAKE 1 TABLET (20 MG TOTAL) BY MOUTH DAILY WITH SUPPER. 90 tablet 3  . zolpidem (AMBIEN) 10 MG tablet Take 10 mg by mouth at bedtime as needed for sleep.    Marland Kitchen allopurinol (ZYLOPRIM) 300 MG tablet Take 300 mg by mouth daily. (Patient not taking: Reported on 07/19/2020)    . morphine (MS CONTIN) 15 MG 12 hr tablet Take 1 tablet (15 mg total) by mouth every 12 (twelve) hours. 6 tablet 0   No current facility-administered medications on file prior to visit.    Radiology:   No results found.  Cardiac Studies:   Echocardiogram  03/26/2010: Left ventricle: The cavity size was normal. There was focal basal and moderate concentric hypertrophy. Systolic function was normal. The estimated ejection fraction was in the range of 60% to 65%. Doppler parameters are consistent with elevated mean left atrial filling pressure.- Ventricular septum: The contour showed systolic flattening. - Left atrium: The atrium was moderately dilated. - Right ventricle: The cavity size was mildly dilated.  Coronary angiogram 03/25/10: Normal LV. Slow flow in the  coronary arteries.  Cardioversion 05/01/10: Failed. transient sinus and back to A. Fib  Sleep Study 07/09/2014 Asencion Partridge Dohmeier, MD): Positive for OSA, pt uses CPAP.  EKG    EKG 07/19/2020: Sinus rhythm with first-degree AV block at rate of 80 bpm, left atrial enlargement, left axis deviation, left anterior fascicular block.  Right bundle branch block.  PACs (2), PVCs (2).  Normal QT interval.  Trifascicular block. No significant change from  EKG 07/14/2019   Assessment     ICD-10-CM   1. Paroxysmal atrial fibrillation ([place]). CHA2DS2VASc score 3, annual stroke risk 3.2% (A, HTN).  I48.0 EKG 12-Lead  2. 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 OSA on CPAP  G47.33    Z99.89   5. Encounter for therapeutic drug monitoring  Z51.81   6. Hypercholesteremia  E78.00 Lipid Panel With LDL/HDL Ratio   CHA2DS2-VASc Score is  3.  Yearly risk of stroke: 3.2% (A, HTN).  Score of 1=0.6; 2=2.2; 3=3.2; 4=4.8; 5=7.2; 6=9.8; 7=>9.8) -(CHF; HTN; vasc disease DM,  Male = 1; Age <65 =0; 65-74 = 1,  >75 =2; stroke/embolism= 2).    No orders of the defined types were placed in this encounter. There are no discontinued medications. Orders Placed This Encounter  Procedures  . Lipid Panel With LDL/HDL Ratio  . CBC  . CMP14+EGFR  . YOV+[id number]+[id number]  . EKG 12-Lead     Recommendations:   Carlos Hanson  is a 80 y.o. Caucasian male  with   morbid obesity, hypertension, obstructive sleep apnea on CPAP and chronic back pain and has had back surgery in the past and on chronic pain meds.  He has chronic dyspnea on exertion, presently tolerating low-dose amiodarone and Xarelto and has maintained sinus rhythm. Presents here for annual visit.  He is presently doing well, tolerating anticoagulation without any bleeding diathesis.  I reviewed his external labs, it appears that he only had A1c.  I will obtain routine labs for therapeutic drug monitoring especially as he is on amiodarone including TSH, T3 and T4 along with CBC, CMP and lipid profile testing for his lipid management.  If he indeed he has had labs by his PCP, we can always cancel this.  EKG reveals persistence of trifascicular block without any symptoms of dizziness or syncope. No changes in medication  3.  I will see him back in a year.   Adrian Prows, MD, [facility] 07/19/2020, 9:48 AM Office: [phone] Pager: [phone]

## 2020-07-30 DIAGNOSIS — L820 Inflamed seborrheic keratosis: Secondary | ICD-10-CM | POA: Diagnosis not present

## 2020-08-29 DIAGNOSIS — R351 Nocturia: Secondary | ICD-10-CM | POA: Diagnosis not present

## 2020-08-29 DIAGNOSIS — K7290 Hepatic failure, unspecified without coma: Secondary | ICD-10-CM | POA: Diagnosis not present

## 2020-08-29 DIAGNOSIS — I10 Essential (primary) hypertension: Secondary | ICD-10-CM | POA: Diagnosis not present

## 2020-08-29 DIAGNOSIS — R3129 Other microscopic hematuria: Secondary | ICD-10-CM | POA: Diagnosis not present

## 2020-08-29 DIAGNOSIS — I480 Paroxysmal atrial fibrillation: Secondary | ICD-10-CM | POA: Diagnosis not present

## 2020-09-05 DIAGNOSIS — H2513 Age-related nuclear cataract, bilateral: Secondary | ICD-10-CM | POA: Diagnosis not present

## 2020-09-05 DIAGNOSIS — H40033 Anatomical narrow angle, bilateral: Secondary | ICD-10-CM | POA: Diagnosis not present

## 2020-10-24 ENCOUNTER — Ambulatory Visit: Payer: PPO | Admitting: Neurology

## 2020-12-26 DIAGNOSIS — E785 Hyperlipidemia, unspecified: Secondary | ICD-10-CM | POA: Diagnosis not present

## 2020-12-26 DIAGNOSIS — E039 Hypothyroidism, unspecified: Secondary | ICD-10-CM | POA: Diagnosis not present

## 2020-12-26 DIAGNOSIS — Z125 Encounter for screening for malignant neoplasm of prostate: Secondary | ICD-10-CM | POA: Diagnosis not present

## 2021-01-02 DIAGNOSIS — G4733 Obstructive sleep apnea (adult) (pediatric): Secondary | ICD-10-CM | POA: Diagnosis not present

## 2021-01-02 DIAGNOSIS — E039 Hypothyroidism, unspecified: Secondary | ICD-10-CM | POA: Diagnosis not present

## 2021-01-02 DIAGNOSIS — E785 Hyperlipidemia, unspecified: Secondary | ICD-10-CM | POA: Diagnosis not present

## 2021-01-02 DIAGNOSIS — I480 Paroxysmal atrial fibrillation: Secondary | ICD-10-CM | POA: Diagnosis not present

## 2021-01-02 DIAGNOSIS — Z7901 Long term (current) use of anticoagulants: Secondary | ICD-10-CM | POA: Diagnosis not present

## 2021-01-02 DIAGNOSIS — Z23 Encounter for immunization: Secondary | ICD-10-CM | POA: Diagnosis not present

## 2021-01-02 DIAGNOSIS — I10 Essential (primary) hypertension: Secondary | ICD-10-CM | POA: Diagnosis not present

## 2021-01-02 DIAGNOSIS — Z0000 Encounter for general adult medical examination without abnormal findings: Secondary | ICD-10-CM | POA: Diagnosis not present

## 2021-01-02 DIAGNOSIS — M545 Low back pain, unspecified: Secondary | ICD-10-CM | POA: Diagnosis not present

## 2021-01-02 DIAGNOSIS — Z1339 Encounter for screening examination for other mental health and behavioral disorders: Secondary | ICD-10-CM | POA: Diagnosis not present

## 2021-01-02 DIAGNOSIS — R82998 Other abnormal findings in urine: Secondary | ICD-10-CM | POA: Diagnosis not present

## 2021-01-02 DIAGNOSIS — R42 Dizziness and giddiness: Secondary | ICD-10-CM | POA: Diagnosis not present

## 2021-01-02 DIAGNOSIS — R7301 Impaired fasting glucose: Secondary | ICD-10-CM | POA: Diagnosis not present

## 2021-01-02 DIAGNOSIS — Z1331 Encounter for screening for depression: Secondary | ICD-10-CM | POA: Diagnosis not present

## 2021-01-14 ENCOUNTER — Telehealth: Payer: Self-pay

## 2021-01-17 ENCOUNTER — Other Ambulatory Visit: Payer: Self-pay | Admitting: Cardiology

## 2021-01-31 DIAGNOSIS — G4733 Obstructive sleep apnea (adult) (pediatric): Secondary | ICD-10-CM | POA: Diagnosis not present

## 2021-02-04 DIAGNOSIS — B351 Tinea unguium: Secondary | ICD-10-CM | POA: Diagnosis not present

## 2021-02-04 DIAGNOSIS — M79676 Pain in unspecified toe(s): Secondary | ICD-10-CM | POA: Diagnosis not present

## 2021-03-04 ENCOUNTER — Ambulatory Visit: Payer: PPO | Admitting: Adult Health

## 2021-03-11 ENCOUNTER — Ambulatory Visit: Payer: PPO | Admitting: Adult Health

## 2021-05-05 NOTE — Progress Notes (Signed)
PATIENT: Carlos Hanson DOB: 10/05/1940  REASON FOR VISIT: follow up HISTORY FROM: patient PRIMARY NEUROLOGIST: Dr Dohmeier  HISTORY OF PRESENT ILLNESS: Today 05/05/21:  Carlos Hanson is an 81 year old male with a history of obstructive sleep apnea on CPAP.  He returns today for follow-up.  The patient has not been seen in our office since 2017.  He is here to reestablish care for his sleep apnea.  He brought his machine with him.  His download is below.  His initial sleep study was in 2016.  He denies any new issues.  Reports that his machine is telling him that it is at the end of its life.    HISTORY (Copied from Dr.Dohmeier's note)  Carlos Hanson is a 81 year old male with a history of obstructive sleep apnea on CPAP. Seen in a RV today, 16 May 2015 Carlos Hanson underwent a CPAP titration on 03/19/2015.  His baseline study is quoted  below. The results were delayed because the study had to be rescored,  Without  additional need of oxygen. He had been classified at being at a high risk of central and lower obstructive sleep apnea. The patient was titrated to 12 cm water pressure which resulted in an AHI of 0.0 a ResMed air fit P 10 and nasal pillow was used in large size. Oxygen supplement was no longer necessary once the CPAP reached a pressure above 10 cm. The average heart rate remained at 66 bpm in normal sinus rhythm. He did have a few limb movement related arousals but those were clinically insignificant. He had only 3 minutes of oxygen desaturation for a total sleep. Of 281 minutes. I would like to add that the tox the patient almost 60 minutes to finally fall asleep.   A compliance report is available and the patient has used a CPAP for 30 out of the last 30 days with 100% compliance at average use of 9 hours and 33 minutes each night he is using an AutoSet between 4 and 10 cm water pressure with full-time EPR his residual AHI is 1.6 he has very few air leaks and overall his pressure needs  each night seem to be just around 10 cm water. He has felt more alert. He has not having trouble sleeping through the night, aside form the lab test night. The patient does not have comorbidities such as diabetes or hypertension,  but is listed as having some back pain and gout, paroxysmal atrial fibrillation's in the past. Back pain is chronic.        REVIEW OF SYSTEMS: Out of a complete 14 system review of symptoms, the patient complains only of the following symptoms, and all other reviewed systems are negative.  FSS 20 ESS 9  ALLERGIES: Allergies  Allergen Reactions   Naprosyn [Naproxen] Rash    HOME MEDICATIONS: Outpatient Medications Prior to Visit  Medication Sig Dispense Refill   acetaminophen (TYLENOL) 500 MG tablet Take 2 tablets (1,000 mg total) by mouth every 8 (eight) hours. 30 tablet 0   allopurinol (ZYLOPRIM) 300 MG tablet Take 300 mg by mouth daily. (Patient not taking: Reported on 07/19/2020)     amiodarone (PACERONE) 100 MG tablet TAKE 1 TABLET BY MOUTH EVERY DAY 90 tablet 3   atorvastatin (LIPITOR) 20 MG tablet Take 1 tablet by mouth daily.     desmopressin (DDAVP) 0.2 MG tablet Take 0.2 mg by mouth daily. 3 caps in the a.m.  1   diltiazem (DILACOR XR) 120 MG 24  hr capsule Take 120 mg by mouth daily.     DULoxetine (CYMBALTA) 30 MG capsule Take 3 capsules (90 mg total) by mouth daily. 6 capsule 0   gabapentin (NEURONTIN) 100 MG capsule Take 1 capsule (100 mg total) by mouth 2 (two) times daily. 6 capsule 0   lactulose (CHRONULAC) 10 GM/15ML solution Take 30 mLs (20 g total) by mouth daily. 240 mL 1   levothyroxine (SYNTHROID) 50 MCG tablet Take 50 mcg by mouth daily before breakfast.     morphine (MS CONTIN) 15 MG 12 hr tablet Take 1 tablet (15 mg total) by mouth every 12 (twelve) hours. 6 tablet 0   Multiple Vitamins-Minerals (VISION FORMULA EYE HEALTH) CAPS Take 1 tablet by mouth 2 (two) times daily. [employer]     oxyCODONE (OXY IR/ROXICODONE) 5 MG immediate  release tablet Take 1 tablet (5 mg total) by mouth 3 (three) times daily as needed for moderate pain or severe pain. 8 tablet 0   oxymetazoline (AFRIN) 0.05 % nasal spray Place 1 spray into both nostrils at bedtime as needed for congestion (sinuses).     pantoprazole (PROTONIX) 40 MG tablet Take 40 mg by mouth 2 (two) times daily.     triamterene-hydrochlorothiazide (MAXZIDE-25) 37.5-25 MG tablet Take 0.5 tablets by mouth 3 (three) times a week. monday wednesday friday     XARELTO 20 MG TABS tablet TAKE 1 TABLET (20 MG TOTAL) BY MOUTH DAILY WITH SUPPER. 90 tablet 3   zolpidem (AMBIEN) 10 MG tablet Take 10 mg by mouth at bedtime as needed for sleep.     No facility-administered medications prior to visit.    PAST MEDICAL HISTORY: Past Medical History:  Diagnosis Date   Arthritis    Chronic back pain    Coronary artery disease    40% mLAD/DIAG bifurcation 03/25/10    Dysrhythmia    History of atrial fibrillation   Gout    H/O hiatal hernia    Hearing decreased    right ear   History of bladder infections    History of bleeding ulcers    History of ulcer disease    Hypercholesteremia    Nocturia    Obstructive sleep apnea hypopnea, severe    Sleep apnea    cpap sleep study 2011    PAST SURGICAL HISTORY: Past Surgical History:  Procedure Laterality Date   BACK SURGERY     CARDIAC CATHETERIZATION     2011   cardioverson     COLONOSCOPY  08/27/2011   Procedure: COLONOSCOPY;  Surgeon: Rogene Houston, MD;  Location: [facility];  Service: Endoscopy;  Laterality: N/A;  930   ESOPHAGOGASTRODUODENOSCOPY     JOINT REPLACEMENT     left shoulder   Left shoulder replacement     LUMBAR LAMINECTOMY  03/15/2012   Procedure: LUMBAR LAMINECTOMY WITH  X-STOP 2 LEVEL;  Surgeon: Kristeen Miss, MD;  Location: [facility];  Service: Neurosurgery;  Laterality: N/A;  Lumbar two-three, three-four XSTOP   TONSILLECTOMY      FAMILY HISTORY: Family History  Problem Relation Age of Onset    Heart failure Mother    Diabetes Mother    Arthritis Mother    High blood pressure Mother    Heart failure Father    Colon cancer Neg Hx     SOCIAL HISTORY: Social History   Socioeconomic History   Marital status: Married    Spouse name: [name]    Number of children: 3   Years of  education: college   Highest education level: Not on file  Occupational History    Comment: Retired  Tobacco Use   Smoking status: Former    Packs/day: 1.00    Years: 25.00    Pack years: 25.00    Types: Cigarettes    Quit date: 07/13/2005    Years since quitting: 15.8   Smokeless tobacco: Former    Types: Chew   Tobacco comments:    Quit 8 years ago.  Vaping Use   Vaping Use: Never used  Substance and Sexual Activity   Alcohol use: Yes    Comment: once a week   Drug use: No   Sexual activity: Not on file  Other Topics Concern   Not on file  Social History Narrative   Patient lives at home with his wife [name]).    Retired.   Education college.   Right handed.   Caffeine one cup of coffee daily.   Social Determinants of Health   Financial Resource Strain: Not on file  Food Insecurity: Not on file  Transportation Needs: Not on file  Physical Activity: Not on file  Stress: Not on file  Social Connections: Not on file  Intimate Partner Violence: Not on file      PHYSICAL EXAM  Vitals:   05/06/21 1128  BP: 135/81  Pulse: 71  Weight: (!) 307 lb 3.2 oz (139.3 kg)  Height: 5\' 11"  (1.803 m)   Body mass index is 42.85 kg/m.  Generalized: Well developed, in no acute distress  Chest: Lungs clear to auscultation bilaterally  Neurological examination  Mentation: Alert oriented to time, place, history taking. Follows all commands speech and language fluent Cranial nerve II-XII: Extraocular movements were full, visual field were full on confrontational test Head turning and shoulder shrug  were normal and symmetric. Motor: The motor testing reveals 5 over 5 strength of all 4  extremities. Good symmetric motor tone is noted throughout.  Sensory: Sensory testing is intact to soft touch on all 4 extremities. No evidence of extinction is noted.  Gait and station: Gait is normal.    DIAGNOSTIC DATA (LABS, IMAGING, TESTING) - I reviewed patient records, labs, notes, testing and imaging myself where available.  Lab Results  Component Value Date   WBC 8.3 06/27/2018   HGB 14.1 06/27/2018   HCT 43.2 06/27/2018   MCV 91 06/27/2018   PLT 351 06/27/2018      Component Value Date/Time   NA 138 06/27/2018 1209   K 4.8 06/27/2018 1209   CL 101 06/27/2018 1209   CO2 23 06/27/2018 1209   GLUCOSE 94 06/27/2018 1209   GLUCOSE 112 (H) 03/11/2018 0126   BUN 19 06/27/2018 1209   CREATININE 1.06 06/27/2018 1209   CALCIUM 9.0 06/27/2018 1209   PROT 6.2 06/27/2018 1209   ALBUMIN 4.1 06/27/2018 1209   AST 12 06/27/2018 1209   ALT 20 06/27/2018 1209   ALKPHOS 93 06/27/2018 1209   BILITOT 0.3 06/27/2018 1209   GFRNONAA 66 06/27/2018 1209   GFRAA 77 06/27/2018 1209   Lab Results  Component Value Date   CHOL  03/26/2010    152        ATP III CLASSIFICATION:  <200     mg/dL   Desirable  200-239  mg/dL   Borderline High  >=240    mg/dL   High          HDL 47 03/26/2010   LDLCALC  03/26/2010    80  Total Cholesterol/HDL:CHD Risk Coronary Heart Disease Risk Table                     Men   Women  1/2 Average Risk   3.4   3.3  Average Risk       5.0   4.4  2 X Average Risk   9.6   7.1  3 X Average Risk  23.4   11.0        Use the calculated Patient Ratio above and the CHD Risk Table to determine the patient's CHD Risk.        ATP III CLASSIFICATION (LDL):  <100     mg/dL   Optimal  100-129  mg/dL   Near or Above                    Optimal  130-159  mg/dL   Borderline  160-189  mg/dL   High  >190     mg/dL   Very High   TRIG 127 03/26/2010   CHOLHDL 3.2 03/26/2010   Lab Results  Component Value Date   TSH 6.080 (H) 06/27/2018      ASSESSMENT  AND PLAN 81 y.o. year old male  has a past medical history of Arthritis, Chronic back pain, Coronary artery disease, Dysrhythmia, Gout, H/O hiatal hernia, Hearing decreased, History of bladder infections, History of bleeding ulcers, History of ulcer disease, Hypercholesteremia, Nocturia, Obstructive sleep apnea hypopnea, severe, and Sleep apnea. here with:  OSA on CPAP  - CPAP compliance excellent - Good treatment of AHI  - Encourage patient to use CPAP nightly and > 4 hours each night -Repeat home sleep test pending results we will order new machine - F/U in 6 months or sooner if needed    Ward Givens, MSN, NP-C 05/05/2021, 1:20 PM [facility] [address] [place], [place] [address] [phone]

## 2021-05-06 ENCOUNTER — Ambulatory Visit: Payer: PPO | Admitting: Adult Health

## 2021-05-06 ENCOUNTER — Other Ambulatory Visit: Payer: Self-pay

## 2021-05-06 ENCOUNTER — Encounter: Payer: Self-pay | Admitting: Adult Health

## 2021-05-06 VITALS — BP 135/81 | HR 71 | Ht 71.0 in | Wt 307.2 lb

## 2021-05-06 DIAGNOSIS — G4733 Obstructive sleep apnea (adult) (pediatric): Secondary | ICD-10-CM | POA: Diagnosis not present

## 2021-05-06 DIAGNOSIS — Z9989 Dependence on other enabling machines and devices: Secondary | ICD-10-CM | POA: Diagnosis not present

## 2021-05-06 NOTE — Patient Instructions (Signed)
Continue using CPAP nightly and greater than 4 hours each night Repeat home sleep test Will order new machine pending results If your symptoms worsen or you develop new symptoms please let us know.

## 2021-05-08 ENCOUNTER — Telehealth: Payer: Self-pay | Admitting: Adult Health

## 2021-05-08 NOTE — Telephone Encounter (Signed)
LVM for pt to call me back to schedule sleep study  

## 2021-05-19 ENCOUNTER — Ambulatory Visit: Payer: PPO | Admitting: Neurology

## 2021-05-19 DIAGNOSIS — G4733 Obstructive sleep apnea (adult) (pediatric): Secondary | ICD-10-CM

## 2021-05-26 ENCOUNTER — Ambulatory Visit (INDEPENDENT_AMBULATORY_CARE_PROVIDER_SITE_OTHER): Payer: PPO | Admitting: Neurology

## 2021-05-26 DIAGNOSIS — G4733 Obstructive sleep apnea (adult) (pediatric): Secondary | ICD-10-CM

## 2021-05-26 DIAGNOSIS — Z9989 Dependence on other enabling machines and devices: Secondary | ICD-10-CM

## 2021-05-28 NOTE — Progress Notes (Signed)
° ° ° °  °  °  [facility] TEST REPORT ( by Watch PAT)   STUDY DATE: 05-27-2021     ORDERING CLINICIAN: Bobbie Stack REFERRING CLINICIAN: Dr Adrian Prows / Dr Philip Aspen   CLINICAL INFORMATION/HISTORY: 05-05-21, patient with paroxysmal atrial fib, morbid obesity and OSA on CPAP. Highly compliant , but CPAP machine is at "end of life". Uses 4-10 cm water pressure auto CPAP, AHI 2.8/h 95% pressure 9.8 cm water.       Epworth sleepiness score: 9/24. FSS at 22/ 63 points.    BMI: 43 kg/m   Neck Circumference: n/a   FINDINGS:   Sleep Summary:   Total Recording Time (hours, min): Total recording time amounted to 9 hours 42 minutes of which 9 hours was a total recorded sleep time with a REM sleep proportion of 23.8%.                                    Respiratory Indices:   Calculated pAHI (per hour):    AHI was 36/h and REM sleep accentuated at 48.4/h and in non-REM sleep the AHI was 32.1/h.  This is not a REM sleep dependent apnea.  Positional apnea data show that the patient slept exclusively in supine position so the AHI is 36.0.  Snoring level was moderate at 41 dB mean volume, snoring accompanied about a quarter of the total sleep time.                                                                 Oxygen Saturation Statistics:    O2 Saturation Range (%): Varied between a nadir of 83% and a maximum of 99% with a mean saturation of 95%.                                     O2 Saturation (minutes) <89%: 1.3 minutes         Pulse Rate Statistics:   Pulse Mean (bpm): 68 bpm               Pulse Range:   Between 37 and 109 bpm.              IMPRESSION:  This HST confirms the presence of severe apnea at baseline without associated hypoxia. The patient needs to continue with apnea therapy and has been doing very well on CPAP which we will reinstate.  His new auto titration CPAP will be set between 5 and 12 cm water pressure with 3 cm EPR, heated  humidification, mask of patient's choice.  The patient prefers a Pharmacist, community.   RECOMMENDATION: His new auto titration CPAP will be set between 5 and 12 cm water pressure with 3 cm EPR, heated humidification, mask of patient's choice.  The patient prefers a Pharmacist, community.   INTERPRETING PHYSICIAN:   Larey Seat, MD   Medical Director of [facility] at [facility].

## 2021-05-29 ENCOUNTER — Other Ambulatory Visit: Payer: Self-pay | Admitting: Adult Health

## 2021-05-29 ENCOUNTER — Telehealth: Payer: Self-pay | Admitting: Adult Health

## 2021-05-29 ENCOUNTER — Telehealth: Payer: Self-pay | Admitting: Neurology

## 2021-05-29 DIAGNOSIS — G4733 Obstructive sleep apnea (adult) (pediatric): Secondary | ICD-10-CM

## 2021-05-29 NOTE — Procedures (Signed)
° °  °  °  [facility] TEST REPORT ( by Watch PAT)   STUDY DATE: 05-27-2021     ORDERING CLINICIAN: Bobbie Stack REFERRING CLINICIAN: Dr Adrian Prows / Dr Philip Aspen   CLINICAL INFORMATION/HISTORY: 05-05-21, patient with paroxysmal atrial fib, morbid obesity and OSA on CPAP. Highly compliant , but CPAP machine is at "end of life". Uses 4-10 cm water pressure auto CPAP, AHI 2.8/h 95% pressure 9.8 cm water.       Epworth sleepiness score: 9/24. FSS at 22/ 63 points.    BMI: 43 kg/m   Neck Circumference: n/a   FINDINGS:   Sleep Summary:   Total Recording Time (hours, min): Total recording time amounted to 9 hours 42 minutes of which 9 hours was a total recorded sleep time with a REM sleep proportion of 23.8%.                                    Respiratory Indices:   Calculated pAHI (per hour):    AHI was 36/h and REM sleep accentuated at 48.4/h and in non-REM sleep the AHI was 32.1/h.  This is not a REM sleep dependent apnea.  Positional apnea data show that the patient slept exclusively in supine position so the AHI is 36.0.  Snoring level was moderate at 41 dB mean volume, snoring accompanied about a quarter of the total sleep time.                                                                 Oxygen Saturation Statistics:    O2 Saturation Range (%): Varied between a nadir of 83% and a maximum of 99% with a mean saturation of 95%.                                     O2 Saturation (minutes) <89%: 1.3 minutes         Pulse Rate Statistics:   Pulse Mean (bpm): 68 bpm               Pulse Range:   Between 37 and 109 bpm.              IMPRESSION:  This HST confirms the presence of severe apnea at baseline without associated hypoxia. The patient needs to continue with apnea therapy and has been doing very well on CPAP which we will reinstate.  His new auto titration CPAP will be set between 5 and 12 cm water pressure with 3 cm EPR, heated humidification,  mask of patient's choice.  The patient prefers a Pharmacist, community.   RECOMMENDATION: His new auto titration CPAP will be set between 5 and 12 cm water pressure with 3 cm EPR, heated humidification, mask of patient's choice.  The patient prefers a Pharmacist, community.   INTERPRETING PHYSICIAN:   Larey Seat, MD   Medical Director of [facility] at [facility].

## 2021-05-29 NOTE — Telephone Encounter (Addendum)
Spoke with the patient and he placed me on speaker phone so his wife could hear as well.  We discussed that his recent home sleep test did show severe sleep apnea. Megan NP has placed an order for a new ResMed machine with an AutoSet pressure of 5 to 12 cm of water.  The patient's questions were answered.  They will wait for [facility] to call, however I encouraged him to call if they have not heard within 1 week. They were very appreciative.  Order for new RESMED machine sent to [facility].

## 2021-05-29 NOTE — Telephone Encounter (Signed)
Please order CPAP- result note went directly to you.    IMPRESSION:  This HST confirms the presence of severe apnea at baseline without associated hypoxia. The patient needs to continue with apnea therapy and has been doing very well on CPAP which we will reinstate.  His new auto titration CPAP will be set between 5 and 12 cm water pressure with 3 cm EPR, heated humidification, mask of patient's choice.  The patient prefers a Pharmacist, community.   RECOMMENDATION: His new auto titration CPAP will be set between 5 and 12 cm water pressure with 3 cm EPR, heated humidification, mask of patient's choice.  The patient prefers a Pharmacist, community.   INTERPRETING PHYSICIAN:   Larey Seat, MD

## 2021-05-29 NOTE — Telephone Encounter (Signed)
Please advise patient that his HST does show severe sleep apnea.  I have ordered a new machine AutoSet with pressure 5 to 12 cm of water, humidification and mask of his choice.

## 2021-06-02 DIAGNOSIS — F1120 Opioid dependence, uncomplicated: Secondary | ICD-10-CM | POA: Diagnosis not present

## 2021-06-02 DIAGNOSIS — L729 Follicular cyst of the skin and subcutaneous tissue, unspecified: Secondary | ICD-10-CM | POA: Diagnosis not present

## 2021-06-02 DIAGNOSIS — R350 Frequency of micturition: Secondary | ICD-10-CM | POA: Diagnosis not present

## 2021-06-02 DIAGNOSIS — R42 Dizziness and giddiness: Secondary | ICD-10-CM | POA: Diagnosis not present

## 2021-06-02 DIAGNOSIS — I480 Paroxysmal atrial fibrillation: Secondary | ICD-10-CM | POA: Diagnosis not present

## 2021-06-10 DIAGNOSIS — I70203 Unspecified atherosclerosis of native arteries of extremities, bilateral legs: Secondary | ICD-10-CM | POA: Diagnosis not present

## 2021-06-10 DIAGNOSIS — B351 Tinea unguium: Secondary | ICD-10-CM | POA: Diagnosis not present

## 2021-06-10 DIAGNOSIS — L84 Corns and callosities: Secondary | ICD-10-CM | POA: Diagnosis not present

## 2021-06-10 DIAGNOSIS — M79676 Pain in unspecified toe(s): Secondary | ICD-10-CM | POA: Diagnosis not present

## 2021-06-23 DIAGNOSIS — L089 Local infection of the skin and subcutaneous tissue, unspecified: Secondary | ICD-10-CM | POA: Diagnosis not present

## 2021-06-23 DIAGNOSIS — L723 Sebaceous cyst: Secondary | ICD-10-CM | POA: Diagnosis not present

## 2021-06-23 DIAGNOSIS — H6121 Impacted cerumen, right ear: Secondary | ICD-10-CM | POA: Diagnosis not present

## 2021-07-07 DIAGNOSIS — F1120 Opioid dependence, uncomplicated: Secondary | ICD-10-CM | POA: Diagnosis not present

## 2021-07-07 DIAGNOSIS — R269 Unspecified abnormalities of gait and mobility: Secondary | ICD-10-CM | POA: Diagnosis not present

## 2021-07-07 DIAGNOSIS — Z7901 Long term (current) use of anticoagulants: Secondary | ICD-10-CM | POA: Diagnosis not present

## 2021-07-07 DIAGNOSIS — I10 Essential (primary) hypertension: Secondary | ICD-10-CM | POA: Diagnosis not present

## 2021-07-07 DIAGNOSIS — R7301 Impaired fasting glucose: Secondary | ICD-10-CM | POA: Diagnosis not present

## 2021-07-07 DIAGNOSIS — I480 Paroxysmal atrial fibrillation: Secondary | ICD-10-CM | POA: Diagnosis not present

## 2021-07-07 DIAGNOSIS — R351 Nocturia: Secondary | ICD-10-CM | POA: Diagnosis not present

## 2021-07-07 DIAGNOSIS — L729 Follicular cyst of the skin and subcutaneous tissue, unspecified: Secondary | ICD-10-CM | POA: Diagnosis not present

## 2021-07-07 DIAGNOSIS — G4733 Obstructive sleep apnea (adult) (pediatric): Secondary | ICD-10-CM | POA: Diagnosis not present

## 2021-07-09 ENCOUNTER — Other Ambulatory Visit: Payer: Self-pay | Admitting: Cardiology

## 2021-07-17 DIAGNOSIS — G4733 Obstructive sleep apnea (adult) (pediatric): Secondary | ICD-10-CM | POA: Diagnosis not present

## 2021-07-17 NOTE — Telephone Encounter (Signed)
DME: [facility] ?Phone: [phone], press option 1 ?Fax: [phone]  ?Resmed Airsense 10 auto ?Setup 07/17/2021 (initial appt needed 08/18/21-10/17/21 ?

## 2021-07-22 ENCOUNTER — Telehealth: Payer: Self-pay | Admitting: Adult Health

## 2021-07-22 NOTE — Telephone Encounter (Signed)
Pt was scheduled for his initial CPAP on 09-02-21 ?Pt was informed to bring machine and power cord to the appointemnt ?DME: [place] ?[phone] option 1 ?F: [phone]  ? ?DME: [facility] ?Phone: [phone], press option 1 ?Fax: [phone]  ?Resmed Airsense 10 auto ?Setup 07/17/2021 (initial appt needed 08/18/21-10/17/21 ?

## 2021-07-22 NOTE — Telephone Encounter (Signed)
Thanks

## 2021-07-30 DIAGNOSIS — R351 Nocturia: Secondary | ICD-10-CM | POA: Diagnosis not present

## 2021-07-31 DIAGNOSIS — L7211 Pilar cyst: Secondary | ICD-10-CM | POA: Diagnosis not present

## 2021-07-31 DIAGNOSIS — L728 Other follicular cysts of the skin and subcutaneous tissue: Secondary | ICD-10-CM | POA: Diagnosis not present

## 2021-08-05 ENCOUNTER — Encounter: Payer: Self-pay | Admitting: Cardiology

## 2021-08-05 ENCOUNTER — Ambulatory Visit: Payer: PPO | Admitting: Cardiology

## 2021-08-05 VITALS — BP 139/75 | HR 54 | Temp 98.0°F | Resp 16 | Ht 71.0 in | Wt 304.8 lb

## 2021-08-05 DIAGNOSIS — G4733 Obstructive sleep apnea (adult) (pediatric): Secondary | ICD-10-CM | POA: Diagnosis not present

## 2021-08-05 DIAGNOSIS — I480 Paroxysmal atrial fibrillation: Secondary | ICD-10-CM | POA: Diagnosis not present

## 2021-08-05 DIAGNOSIS — I453 Trifascicular block: Secondary | ICD-10-CM | POA: Diagnosis not present

## 2021-08-05 DIAGNOSIS — Z9989 Dependence on other enabling machines and devices: Secondary | ICD-10-CM | POA: Diagnosis not present

## 2021-08-05 DIAGNOSIS — I10 Essential (primary) hypertension: Secondary | ICD-10-CM

## 2021-08-05 NOTE — Progress Notes (Signed)
? ?Primary Physician/Referring:  Donnajean Lopes, MD ? ?Patient ID: Carlos Hanson, male    DOB: 07/25/1940, 81 y.o.   MRN: [id number] ? ?Chief Complaint  ?Patient presents with  ? Atrial Fibrillation  ? Hypertension  ? Follow-up  ?  1 year  ? ?HPI:   ? ?Carlos Hanson  is a 81 y.o. Caucasian male  with  morbid obesity, hypertension, obstructive sleep apnea on CPAP and chronic back pain.  He has chronic dyspnea on exertion, presently tolerating low-dose amiodarone and Xarelto and has maintained sinus rhythm.  Presents here for annual visit.  He is accompanied by his wife. ? ?He has chronic dyspnea but no PND or orthopnea.  Chronic leg edema persist.  No change. ? ?Past Medical History:  ?Diagnosis Date  ? Arthritis   ? Chronic back pain   ? Coronary artery disease   ? 40% mLAD/DIAG bifurcation 03/25/10   ? Dysrhythmia   ? History of atrial fibrillation  ? Gout   ? H/O hiatal hernia   ? Hearing decreased   ? right ear  ? History of bladder infections   ? History of bleeding ulcers   ? History of ulcer disease   ? Hypercholesteremia   ? Nocturia   ? Obstructive sleep apnea hypopnea, severe   ? Sleep apnea   ? cpap sleep study 2011  ? ?Social History  ? ?Tobacco Use  ? Smoking status: Former  ?  Packs/day: 1.00  ?  Years: 25.00  ?  Pack years: 25.00  ?  Types: Cigarettes  ?  Quit date: 07/13/2005  ?  Years since quitting: 16.0  ? Smokeless tobacco: Former  ?  Types: Chew  ? Tobacco comments:  ?  Quit 8 years ago.  ?Substance Use Topics  ? Alcohol use: Yes  ?  Alcohol/week: 2.0 standard drinks  ?  Types: 2 Shots of liquor per week  ?  Comment: once a week  ? ?ROS  ?Review of Systems  ?Cardiovascular:  Positive for dyspnea on exertion and leg swelling. Negative for chest pain.  ?Musculoskeletal:  Positive for arthritis and back pain.  ?Gastrointestinal:  Negative for melena.  ?Objective  ?Blood pressure 139/75, pulse (!) 54, temperature 98 ?F (36.7 ?C), temperature source Temporal, resp. rate 16, height _0  (1.803 m),  weight (!) 304 lb 12.8 oz (138.3 kg), SpO2 95 %.  ? ?  08/05/2021  ?  2:47 PM 05/06/2021  ? 11:28 AM 07/19/2020  ?  8:46 AM  ?Vitals with BMI  ?Height _1  _2  _3   ?Weight 304 lbs 13 oz 307 lbs 3 oz 314 lbs  ?BMI 42.53 42.86 42.58  ?Systolic 710 626 948  ?Diastolic 75 81 66  ?Pulse 54 71 62  ?  ? Physical Exam ?Constitutional:   ?   General: He is not in acute distress. ?   Appearance: He is well-developed.  ?   Comments: Morbidly obese  ?Neck:  ?   Vascular: No JVD.  ?Cardiovascular:  ?   Rate and Rhythm: Normal rate and regular rhythm. FrequentExtrasystoles are present. ?   Pulses:     ?     Carotid pulses are 2+ on the right side and 2+ on the left side. ?     Dorsalis pedis pulses are 2+ on the right side and 2+ on the left side.  ?     Posterior tibial pulses are 2+ on the right side and 2+ on the  left side.  ?   Heart sounds: Normal heart sounds. No murmur heard. ?  No gallop.  ?Pulmonary:  ?   Effort: Pulmonary effort is normal.  ?   Breath sounds: Normal breath sounds.  ?Abdominal:  ?   General: Bowel sounds are normal.  ?   Palpations: Abdomen is soft.  ?Musculoskeletal:  ?   Right lower leg: Edema (2+ bilateral pitting edema) present.  ?   Left lower leg: Edema (2+ bilateral pitting edema) present.  ? ?Laboratory examination:  ? ? ?External labs:  ? ?Labs 12/26/2020: ? ?BUN 17, creatinine 1.1, EGFR 64 mill, potassium 4.5, LFTs normal. ? ?Hb 14.6/HCT 42.0, platelets 314. ? ?Total cholesterol 118, triglycerides 47, HDL 66, LDL 43. ? ?TSH 1.87.  ? ?Medications and allergies  ? ?Allergies  ?Allergen Reactions  ? Naprosyn [Naproxen] Rash  ?  ? ?Current Outpatient Medications:  ?  acetaminophen (TYLENOL) 500 MG tablet, Take 2 tablets (1,000 mg total) by mouth every 8 (eight) hours., Disp: 30 tablet, Rfl: 0 ?  atorvastatin (LIPITOR) 20 MG tablet, Take 1 tablet by mouth daily., Disp: , Rfl:  ?  diltiazem (DILACOR XR) 120 MG 24 hr capsule, Take 120 mg by mouth daily., Disp: , Rfl:  ?  DULoxetine (CYMBALTA) 30 MG  capsule, Take 3 capsules (90 mg total) by mouth daily., Disp: 6 capsule, Rfl: 0 ?  famotidine (PEPCID) 20 MG tablet, Take 20 mg by mouth daily., Disp: , Rfl:  ?  gabapentin (NEURONTIN) 100 MG capsule, Take 1 capsule (100 mg total) by mouth 2 (two) times daily., Disp: 6 capsule, Rfl: 0 ?  lactulose (CHRONULAC) 10 GM/15ML solution, Take 30 mLs (20 g total) by mouth daily., Disp: 240 mL, Rfl: 1 ?  levothyroxine (SYNTHROID) 50 MCG tablet, Take 50 mcg by mouth daily before breakfast., Disp: , Rfl:  ?  MELATONIN PO, Take by mouth at bedtime., Disp: , Rfl:  ?  morphine (MS CONTIN) 15 MG 12 hr tablet, Take 1 tablet (15 mg total) by mouth every 12 (twelve) hours., Disp: 6 tablet, Rfl: 0 ?  Multiple Vitamins-Minerals (VISION FORMULA EYE HEALTH) CAPS, Take 1 tablet by mouth 2 (two) times daily. [employer], Disp: , Rfl:  ?  oxyCODONE (OXY IR/ROXICODONE) 5 MG immediate release tablet, Take 1 tablet (5 mg total) by mouth 3 (three) times daily as needed for moderate pain or severe pain., Disp: 8 tablet, Rfl: 0 ?  oxymetazoline (AFRIN) 0.05 % nasal spray, Place 1 spray into both nostrils at bedtime as needed for congestion (sinuses)., Disp: , Rfl:  ?  XARELTO 20 MG TABS tablet, TAKE 1 TABLET BY MOUTH DAILY WITH SUPPER., Disp: 90 tablet, Rfl: 3 ?  amiodarone (PACERONE) 200 MG tablet, Take 0.5 tablets (100 mg total) by mouth daily., Disp: , Rfl:   ?  ?Radiology:  ? ?No results found. ? ?Cardiac Studies:  ? ?Echocardiogram 03/26/2010: Left ventricle: The cavity size was normal. There was focal basal and moderate concentric hypertrophy. Systolic function was normal. ?The estimated ejection fraction was in the range of 60% to 65%. Doppler parameters are consistent with elevated mean left atrial filling pressure. - Ventricular septum: The contour showed systolic flattening. ? - Left atrium: The atrium was moderately dilated.  - Right ventricle: The cavity size was mildly dilated. ? ?Coronary angiogram 03/25/10: Normal LV. Slow flow in  the  coronary arteries. ? ?Cardioversion 05/01/10: Failed. transient sinus and back to A. Fib ? ?Sleep Study 07/09/2014 Asencion Partridge Dohmeier, MD): Positive for  OSA, pt uses CPAP. ? ?EKG ?EKG 08/05/2021: Sinus rhythm with first-degree block at the rate of 63 bpm, left axis deviation, left anterior fascicular block.  Right bundle branch block.  Trifascicular block.  No significant change in from 07/19/2020 and 07/14/2019.  ? ?Assessment  ? ?  ICD-10-CM   ?1. Paroxysmal atrial fibrillation ([place]). CHA2DS2VASc score 3, annual stroke risk 3.2% (A, HTN).  I48.0 EKG 12-Lead  ?  amiodarone (PACERONE) 200 MG tablet  ?  ?2. Essential hypertension  I10   ?  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  ?4. OSA on CPAP  G47.33   ? Z99.89   ?  ? ?CHA2DS2-VASc Score is 3.  Yearly risk of stroke: 3.2% (A, HTN).  Score of 1=0.6; 2=2.2; 3=3.2; 4=4.8; 5=7.2; 6=9.8; 7=>9.8) ?-(CHF; HTN; vasc disease DM,  Male = 1; Age <65 =0; 65-74 = 1,  >75 =2; stroke/embolism= 2).   ? ?No orders of the defined types were placed in this encounter. ? ?Medications Discontinued During This Encounter  ?Medication Reason  ? desmopressin (DDAVP) 0.2 MG tablet   ? allopurinol (ZYLOPRIM) 300 MG tablet   ? pantoprazole (PROTONIX) 40 MG tablet Change in therapy  ? triamterene-hydrochlorothiazide (MAXZIDE-25) 37.5-25 MG tablet   ? zolpidem (AMBIEN) 10 MG tablet   ? amiodarone (PACERONE) 100 MG tablet Reorder  ? ?Orders Placed This Encounter  ?Procedures  ? EKG 12-Lead  ?   ?Recommendations:  ? ?Carlos Hanson  is a 81 y.o. Caucasian male  with  morbid obesity, hypertension, obstructive sleep apnea on CPAP and chronic back pain.  He has chronic dyspnea on exertion, presently tolerating low-dose amiodarone and Xarelto and has maintained sinus rhythm.  Presents here for annual visit.  He is accompanied by his wife. ? ?He is presently doing well, tolerating anticoagulation without any bleeding diathesis.  I reviewed his external labs, all the labs including renal function, CBC and  lipids are at goal.  He remains asymptomatic. ? ?He does have underlying trifascicular block but has not had any episodes of syncope or near syncope.  Blood pressure is well controlled.  No changes in the

## 2021-08-06 DIAGNOSIS — I10 Essential (primary) hypertension: Secondary | ICD-10-CM | POA: Diagnosis not present

## 2021-08-06 DIAGNOSIS — R7301 Impaired fasting glucose: Secondary | ICD-10-CM | POA: Diagnosis not present

## 2021-08-06 DIAGNOSIS — E785 Hyperlipidemia, unspecified: Secondary | ICD-10-CM | POA: Diagnosis not present

## 2021-08-17 DIAGNOSIS — G4733 Obstructive sleep apnea (adult) (pediatric): Secondary | ICD-10-CM | POA: Diagnosis not present

## 2021-09-01 DIAGNOSIS — L821 Other seborrheic keratosis: Secondary | ICD-10-CM | POA: Diagnosis not present

## 2021-09-02 ENCOUNTER — Ambulatory Visit: Payer: PPO | Admitting: Neurology

## 2021-09-02 ENCOUNTER — Encounter: Payer: Self-pay | Admitting: Neurology

## 2021-09-02 VITALS — BP 149/90 | HR 69 | Ht 71.0 in | Wt 301.0 lb

## 2021-09-02 DIAGNOSIS — I480 Paroxysmal atrial fibrillation: Secondary | ICD-10-CM | POA: Diagnosis not present

## 2021-09-02 DIAGNOSIS — Z6841 Body Mass Index (BMI) 40.0 and over, adult: Secondary | ICD-10-CM | POA: Diagnosis not present

## 2021-09-02 DIAGNOSIS — E662 Morbid (severe) obesity with alveolar hypoventilation: Secondary | ICD-10-CM

## 2021-09-02 NOTE — Progress Notes (Signed)
? ? ?PATIENT: Carlos Hanson ?DOB: 11-Oct-1940 ? ?REASON FOR VISIT: follow up- OSA on CPAP ?HISTORY FROM: patient,  ?Dr Leanna Battles. [facility] ? ?HISTORY OF PRESENT ILLNESS: ? ?09-02-2021: ?RV CD ?Carlos Hanson follows up on his new HST results and CPAP device compliance . He has followed with Ward Givens, NP. For several years before, she ordered the HST.  ?Carlos. Carlos Hanson has a history of cardiac arrhythmia and has been on amiodarone for several years he is also remaining anticoagulated, amiodarone has let to hypothyroidism. he does have as needed medications listed for severe pain but I see no refills since 2019.Dr Philip Aspen.  ?His home sleep test on May 27, 2021 confirms the presence of sleep apnea the patient was neither excessively daytime sleepy nor fatigued.  He remained up with a BMI of 43 kg per metered square.  His overall AHI was 36/h REM accentuated but not REM sleep dependent.  Snoring was moderate.  He did not have significant hypoxemia only 1.3 minutes of low oxygen saturation were noted during this home sleep test but his pulse range showed a great variability between 37 bpm and 109 bpm attributed to atrial fibrillation.  CPAP was therefore still our recommendation #1 and an autotitration device between 5 and 12 cm water pressure with 3 cm expiratory pressure relief was ordered.  The patient could use a mask or interface of his comfort.  I would like to add that the patient's sleep pattern was very and fragmented which means that he did only have 1 interruption of wakefulness and a sleep recording between 10 PM and 7 AM. ? ?09/02/21 ? ?  ? ? ? ?Carlos Hanson is a 81 year old male with a history of obstructive sleep apnea on CPAP. Seen in a RV today, 16 May 2015 ?Carlos Hanson underwent a CPAP titration on 03/19/2015.  His baseline study is quoted  below. The results were delayed because the study had to be rescored,  Without  additional need of oxygen. He had been classified at being at a high risk of  central and lower obstructive sleep apnea. The patient was titrated to 12 cm water pressure which resulted in an AHI of 0.0 a ResMed air fit P 10 and nasal pillow was used in large size. Oxygen supplement was no longer necessary once the CPAP reached a pressure above 10 cm. The average heart rate remained at 66 bpm in normal sinus rhythm. He did have a few limb movement related arousals but those were clinically insignificant. He had only 3 minutes of oxygen desaturation for a total sleep. Of 281 minutes. I would like to add that the tox the patient almost 60 minutes to finally fall asleep. ? ?A compliance report is available and the patient has used a CPAP for 30 out of the last 30 days with 100% compliance at average use of 9 hours and 33 minutes each night he is using an AutoSet between 4 and 10 cm water pressure with full-time EPR his residual AHI is 1.6 he has very few air leaks and overall his pressure needs each night seem to be just around 10 cm water. ?He has felt more alert. He has not having trouble sleeping through the night, aside form the lab test night. ?The patient does not have comorbidities such as diabetes or hypertension,  but is listed as having some back pain and gout, paroxysmal atrial fibrillation's in the past. ?Back pain is chronic.   ? ? ? ?2016- CD/ MM  He returns today to discuss his overnight pulse oximetry test. It was explained to the patient that Dr. Brett Fairy has recommended that he have supplement oxygen in addition to his CPAP at bedtime. However his insurance requires a another CPAP titration in order for this to be covered. Patient is amenable to having this test that however he would like to speak with Dr. Sharlett Iles to get his input before scheduling this test. Patient states that he uses his CPAP nightly. He does notice some changes with his memory. His wife has reported this as well. He states that this is  nothing significant but he notices that he is not as "sharp as he used  to be." Patient's Epworth sleepiness score today is 8. His CPAP download indicates that he uses his machine 30 out of 30 days for compliance of 100%. He uses machine greater than 4 hours each night. On average he uses his machine 9 hours and 54 minutes. The patient's minimum pressure is 4 cm of water and maximum pressure of 10 cm of water with EPR of 3. The patient's residual AHI is 1.4 with no significant leak. He returns today for an evaluation. ? ?HISTORY (Voncille Simm): ?Carlos Hanson underwent a baseline polysomnography with a date of recording 08-09-14 interpreted by my colleague Dr. Richardean Chimera. It showed an AHI of 13.8 and an RDI of 16 in supine his AHI was lower at 8.2 the REM AHI was 10.9. His lowest oxygen saturation was 83% but he had 263 minutes of desaturations. Heart rate was steady no arrhythmias were noted. He was titrated to CPAP in a return visit on 10-16-14 and a XL sized pico nasal mask was ordered. He still had desaturations down to 87% of oxygen saturation with 120 minutes duration during the titration study. ?Carlos Hanson is here today for so-called compliance visit. A download was obtained from his CPAP which shows that he is using an AUTO set between 4 and 10 cm water pressure, with 3 cm EPR. 100% of the days used over the last 30 days and 100% of those over 4 hours of consecutive use with an average user time of 9 hours and 34 minutes. Residual AHI is 1.0. This is an excellent result and his 95th percentile pressure was 9.5 cm water. ?He endorsed today the Epworth score at 11 points.   ?There are no adjustments to the machine needed from a medical standpoint. The patient has an optimal resolution of his apnea. ?He may still have desaturations, as noted in his sleep study ? ?REVIEW OF SYSTEMS: Out of a complete 14 system review of symptoms, the patient complains only of the following symptoms, and all other reviewed systems are negative. ?Blurred vision, hearing loss, dizziness, abdominal allergies,  back pain, apnea, frequent waking ? ?The patient noted participates of regular exercise -he prefers to swim which has helped his back, he remains overweight. He has not adhered to a diet. ? ?ALLERGIES: ?Allergies  ?Allergen Reactions  ? Naprosyn [Naproxen] Rash  ? ? ?HOME MEDICATIONS: ?Outpatient Medications Prior to Visit  ?Medication Sig Dispense Refill  ? acetaminophen (TYLENOL) 500 MG tablet Take 2 tablets (1,000 mg total) by mouth every 8 (eight) hours. 30 tablet 0  ? amiodarone (PACERONE) 200 MG tablet Take 0.5 tablets (100 mg total) by mouth daily.    ? atorvastatin (LIPITOR) 20 MG tablet Take 1 tablet by mouth daily.    ? diltiazem (DILACOR XR) 120 MG 24 hr capsule Take 120 mg by mouth daily.    ?  DULoxetine (CYMBALTA) 30 MG capsule Take 3 capsules (90 mg total) by mouth daily. 6 capsule 0  ? famotidine (PEPCID) 20 MG tablet Take 20 mg by mouth daily.    ? gabapentin (NEURONTIN) 100 MG capsule Take 1 capsule (100 mg total) by mouth 2 (two) times daily. 6 capsule 0  ? lactulose (CHRONULAC) 10 GM/15ML solution Take 30 mLs (20 g total) by mouth daily. 240 mL 1  ? levothyroxine (SYNTHROID) 50 MCG tablet Take 50 mcg by mouth daily before breakfast.    ? MELATONIN PO Take by mouth at bedtime.    ? morphine (MS CONTIN) 15 MG 12 hr tablet Take 1 tablet (15 mg total) by mouth every 12 (twelve) hours. 6 tablet 0  ? Multiple Vitamins-Minerals (VISION FORMULA EYE HEALTH) CAPS Take 1 tablet by mouth 2 (two) times daily. [employer]    ? oxyCODONE (OXY IR/ROXICODONE) 5 MG immediate release tablet Take 1 tablet (5 mg total) by mouth 3 (three) times daily as needed for moderate pain or severe pain. 8 tablet 0  ? oxymetazoline (AFRIN) 0.05 % nasal spray Place 1 spray into both nostrils at bedtime as needed for congestion (sinuses).    ? XARELTO 20 MG TABS tablet TAKE 1 TABLET BY MOUTH DAILY WITH SUPPER. 90 tablet 3  ? ?No facility-administered medications prior to visit.  ? ? ?PAST MEDICAL HISTORY: ?Past Medical History:   ?Diagnosis Date  ? Arthritis   ? Chronic back pain   ? Coronary artery disease   ? 40% mLAD/DIAG bifurcation 03/25/10   ? Dysrhythmia   ? History of atrial fibrillation  ? Gout   ? H/O hiatal hernia   ? [place]

## 2021-09-02 NOTE — Patient Instructions (Signed)
Screening for Sleep Apnea  Sleep apnea is a condition in which breathing pauses or becomes shallow during sleep. Sleep apnea screening is a test to determine if you are at risk for sleep apnea. The test includes a series of questions. It will only takes a few minutes. Your health care provider may ask you to have this test in preparation for surgery or as part of a physical exam. What are the symptoms of sleep apnea? Common symptoms of sleep apnea include: Snoring. Waking up often at night. Daytime sleepiness. Pauses in breathing. Choking or gasping during sleep. Irritability. Forgetfulness. Trouble thinking clearly. Depression. Personality changes. Most people with sleep apnea do not know that they have it. What are the advantages of sleep apnea screening? Getting screened for sleep apnea can help: Ensure your safety. It is important for your health care providers to know whether or not you have sleep apnea, especially if you are having surgery or have other long-term (chronic) health conditions. Improve your health and allow you to get a better night's rest. Restful sleep can help you: Have more energy. Lose weight. Improve high blood pressure. Improve diabetes management. Prevent stroke. Prevent car accidents. What happens during the screening? Screening usually includes being asked a list of questions about your sleep quality. Some questions you may be asked include: Do you snore? Is your sleep restless? Do you have daytime sleepiness? Has a partner or spouse told you that you stop breathing during sleep? Have you had trouble concentrating or memory loss? What is your age? What is your neck circumference? To measure your neck, keep your back straight and gently wrap the tape measure around your neck. Put the tape measure at the middle of your neck, between your chin and collarbone. What is your sex assigned at birth? Do you have or are you being treated for high blood  pressure? If your screening test is positive, you are at risk for the condition. Further testing may be needed to confirm a diagnosis of sleep apnea. Where to find more information You can find screening tools online or at your health care clinic. For more information about sleep apnea screening and healthy sleep, visit these websites: Centers for Disease Control and Prevention: www.cdc.gov American Sleep Apnea Association: www.sleepapnea.org Contact a health care provider if: You think that you may have sleep apnea. Summary Sleep apnea screening can help determine if you are at risk for sleep apnea. It is important for your health care providers to know whether or not you have sleep apnea, especially if you are having surgery or have other chronic health conditions. You may be asked to take a screening test for sleep apnea in preparation for surgery or as part of a physical exam. This information is not intended to replace advice given to you by your health care provider. Make sure you discuss any questions you have with your health care provider. Document Revised: 03/29/2020 Document Reviewed: 03/29/2020 Elsevier Patient Education  2023 [employer]  

## 2021-09-09 DIAGNOSIS — L84 Corns and callosities: Secondary | ICD-10-CM | POA: Diagnosis not present

## 2021-09-09 DIAGNOSIS — I70203 Unspecified atherosclerosis of native arteries of extremities, bilateral legs: Secondary | ICD-10-CM | POA: Diagnosis not present

## 2021-09-09 DIAGNOSIS — B351 Tinea unguium: Secondary | ICD-10-CM | POA: Diagnosis not present

## 2021-09-09 DIAGNOSIS — M79676 Pain in unspecified toe(s): Secondary | ICD-10-CM | POA: Diagnosis not present

## 2021-09-16 DIAGNOSIS — G4733 Obstructive sleep apnea (adult) (pediatric): Secondary | ICD-10-CM | POA: Diagnosis not present

## 2021-10-13 ENCOUNTER — Telehealth: Payer: Self-pay

## 2021-10-13 ENCOUNTER — Other Ambulatory Visit: Payer: Self-pay

## 2021-10-13 DIAGNOSIS — I480 Paroxysmal atrial fibrillation: Secondary | ICD-10-CM

## 2021-10-13 MED ORDER — AMIODARONE HCL 200 MG PO TABS: 100.0000 mg | ORAL_TABLET | Freq: Every day | ORAL | 1 refills | Status: DC

## 2021-10-13 NOTE — Telephone Encounter (Signed)
Patients wife called requesting a refill on Amioderone '200mg'$  0.5 tabs daily.

## 2021-10-14 NOTE — Telephone Encounter (Signed)
Done

## 2021-10-17 DIAGNOSIS — G4733 Obstructive sleep apnea (adult) (pediatric): Secondary | ICD-10-CM | POA: Diagnosis not present

## 2021-11-03 DIAGNOSIS — H2513 Age-related nuclear cataract, bilateral: Secondary | ICD-10-CM | POA: Diagnosis not present

## 2021-11-03 DIAGNOSIS — H40033 Anatomical narrow angle, bilateral: Secondary | ICD-10-CM | POA: Diagnosis not present

## 2021-11-16 DIAGNOSIS — G4733 Obstructive sleep apnea (adult) (pediatric): Secondary | ICD-10-CM | POA: Diagnosis not present

## 2021-12-16 DIAGNOSIS — L84 Corns and callosities: Secondary | ICD-10-CM | POA: Diagnosis not present

## 2021-12-16 DIAGNOSIS — B351 Tinea unguium: Secondary | ICD-10-CM | POA: Diagnosis not present

## 2021-12-16 DIAGNOSIS — I70203 Unspecified atherosclerosis of native arteries of extremities, bilateral legs: Secondary | ICD-10-CM | POA: Diagnosis not present

## 2021-12-16 DIAGNOSIS — M79676 Pain in unspecified toe(s): Secondary | ICD-10-CM | POA: Diagnosis not present

## 2021-12-17 DIAGNOSIS — G4733 Obstructive sleep apnea (adult) (pediatric): Secondary | ICD-10-CM | POA: Diagnosis not present

## 2022-01-07 DIAGNOSIS — L820 Inflamed seborrheic keratosis: Secondary | ICD-10-CM | POA: Diagnosis not present

## 2022-01-17 DIAGNOSIS — G4733 Obstructive sleep apnea (adult) (pediatric): Secondary | ICD-10-CM | POA: Diagnosis not present

## 2022-02-16 DIAGNOSIS — G4733 Obstructive sleep apnea (adult) (pediatric): Secondary | ICD-10-CM | POA: Diagnosis not present

## 2022-02-24 DIAGNOSIS — Z125 Encounter for screening for malignant neoplasm of prostate: Secondary | ICD-10-CM | POA: Diagnosis not present

## 2022-02-24 DIAGNOSIS — R7989 Other specified abnormal findings of blood chemistry: Secondary | ICD-10-CM | POA: Diagnosis not present

## 2022-02-24 DIAGNOSIS — E039 Hypothyroidism, unspecified: Secondary | ICD-10-CM | POA: Diagnosis not present

## 2022-02-24 DIAGNOSIS — I10 Essential (primary) hypertension: Secondary | ICD-10-CM | POA: Diagnosis not present

## 2022-02-24 DIAGNOSIS — R7301 Impaired fasting glucose: Secondary | ICD-10-CM | POA: Diagnosis not present

## 2022-02-24 DIAGNOSIS — E785 Hyperlipidemia, unspecified: Secondary | ICD-10-CM | POA: Diagnosis not present

## 2022-02-24 DIAGNOSIS — M109 Gout, unspecified: Secondary | ICD-10-CM | POA: Diagnosis not present

## 2022-03-03 DIAGNOSIS — M545 Low back pain, unspecified: Secondary | ICD-10-CM | POA: Diagnosis not present

## 2022-03-03 DIAGNOSIS — I480 Paroxysmal atrial fibrillation: Secondary | ICD-10-CM | POA: Diagnosis not present

## 2022-03-03 DIAGNOSIS — Z7901 Long term (current) use of anticoagulants: Secondary | ICD-10-CM | POA: Diagnosis not present

## 2022-03-03 DIAGNOSIS — E785 Hyperlipidemia, unspecified: Secondary | ICD-10-CM | POA: Diagnosis not present

## 2022-03-03 DIAGNOSIS — G4733 Obstructive sleep apnea (adult) (pediatric): Secondary | ICD-10-CM | POA: Diagnosis not present

## 2022-03-03 DIAGNOSIS — Z0000 Encounter for general adult medical examination without abnormal findings: Secondary | ICD-10-CM | POA: Diagnosis not present

## 2022-03-03 DIAGNOSIS — E039 Hypothyroidism, unspecified: Secondary | ICD-10-CM | POA: Diagnosis not present

## 2022-03-03 DIAGNOSIS — F1120 Opioid dependence, uncomplicated: Secondary | ICD-10-CM | POA: Diagnosis not present

## 2022-03-03 DIAGNOSIS — R42 Dizziness and giddiness: Secondary | ICD-10-CM | POA: Diagnosis not present

## 2022-03-03 DIAGNOSIS — I10 Essential (primary) hypertension: Secondary | ICD-10-CM | POA: Diagnosis not present

## 2022-03-03 DIAGNOSIS — R82998 Other abnormal findings in urine: Secondary | ICD-10-CM | POA: Diagnosis not present

## 2022-03-03 DIAGNOSIS — E790 Hyperuricemia without signs of inflammatory arthritis and tophaceous disease: Secondary | ICD-10-CM | POA: Diagnosis not present

## 2022-03-04 DIAGNOSIS — H40033 Anatomical narrow angle, bilateral: Secondary | ICD-10-CM | POA: Diagnosis not present

## 2022-03-04 DIAGNOSIS — H2513 Age-related nuclear cataract, bilateral: Secondary | ICD-10-CM | POA: Diagnosis not present

## 2022-03-19 DIAGNOSIS — G4733 Obstructive sleep apnea (adult) (pediatric): Secondary | ICD-10-CM | POA: Diagnosis not present

## 2022-04-18 DIAGNOSIS — G4733 Obstructive sleep apnea (adult) (pediatric): Secondary | ICD-10-CM | POA: Diagnosis not present

## 2022-04-21 DIAGNOSIS — L218 Other seborrheic dermatitis: Secondary | ICD-10-CM | POA: Diagnosis not present

## 2022-04-21 DIAGNOSIS — B9689 Other specified bacterial agents as the cause of diseases classified elsewhere: Secondary | ICD-10-CM | POA: Diagnosis not present

## 2022-04-21 DIAGNOSIS — L02811 Cutaneous abscess of head [any part, except face]: Secondary | ICD-10-CM | POA: Diagnosis not present

## 2022-05-19 DIAGNOSIS — G4733 Obstructive sleep apnea (adult) (pediatric): Secondary | ICD-10-CM | POA: Diagnosis not present

## 2022-05-27 DIAGNOSIS — L02821 Furuncle of head [any part, except face]: Secondary | ICD-10-CM | POA: Diagnosis not present

## 2022-05-27 DIAGNOSIS — L291 Pruritus scroti: Secondary | ICD-10-CM | POA: Diagnosis not present

## 2022-07-01 DIAGNOSIS — L02821 Furuncle of head [any part, except face]: Secondary | ICD-10-CM | POA: Diagnosis not present

## 2022-07-16 ENCOUNTER — Encounter (HOSPITAL_BASED_OUTPATIENT_CLINIC_OR_DEPARTMENT_OTHER): Payer: Self-pay | Admitting: Emergency Medicine

## 2022-07-16 ENCOUNTER — Other Ambulatory Visit: Payer: Self-pay

## 2022-07-16 ENCOUNTER — Emergency Department (HOSPITAL_BASED_OUTPATIENT_CLINIC_OR_DEPARTMENT_OTHER)
Admission: EM | Admit: 2022-07-16 | Discharge: 2022-07-16 | Disposition: A | Discharge status: Home / Self Care | Payer: PPO | Attending: Emergency Medicine | Admitting: Emergency Medicine

## 2022-07-16 DIAGNOSIS — Z20822 Contact with and (suspected) exposure to covid-19: Secondary | ICD-10-CM | POA: Insufficient documentation

## 2022-07-16 DIAGNOSIS — I2510 Atherosclerotic heart disease of native coronary artery without angina pectoris: Secondary | ICD-10-CM | POA: Diagnosis not present

## 2022-07-16 DIAGNOSIS — Z7901 Long term (current) use of anticoagulants: Secondary | ICD-10-CM | POA: Insufficient documentation

## 2022-07-16 DIAGNOSIS — R42 Dizziness and giddiness: Secondary | ICD-10-CM | POA: Insufficient documentation

## 2022-07-16 DIAGNOSIS — K529 Noninfective gastroenteritis and colitis, unspecified: Secondary | ICD-10-CM | POA: Diagnosis not present

## 2022-07-16 DIAGNOSIS — R112 Nausea with vomiting, unspecified: Secondary | ICD-10-CM | POA: Diagnosis present

## 2022-07-16 LAB — URINALYSIS, ROUTINE W REFLEX MICROSCOPIC
Bacteria, UA: NONE SEEN
Bilirubin Urine: NEGATIVE
Glucose, UA: NEGATIVE mg/dL
Hgb urine dipstick: NEGATIVE
Leukocytes,Ua: NEGATIVE
Nitrite: NEGATIVE
Protein, ur: 30 mg/dL — AB
Specific Gravity, Urine: 1.026 (ref 1.005–1.030)
pH: 5.5 (ref 5.0–8.0)

## 2022-07-16 LAB — COMPREHENSIVE METABOLIC PANEL
ALT: 15 U/L (ref 0–44)
AST: 17 U/L (ref 15–41)
Albumin: 3.8 g/dL (ref 3.5–5.0)
Alkaline Phosphatase: 44 U/L (ref 38–126)
Anion gap: 10 (ref 5–15)
BUN: 25 mg/dL — ABNORMAL HIGH (ref 8–23)
CO2: 20 mmol/L — ABNORMAL LOW (ref 22–32)
Calcium: 8.6 mg/dL — ABNORMAL LOW (ref 8.9–10.3)
Chloride: 106 mmol/L (ref 98–111)
Creatinine, Ser: 1.4 mg/dL — ABNORMAL HIGH (ref 0.61–1.24)
GFR, Estimated: 50 mL/min — ABNORMAL LOW (ref >60)
Glucose, Bld: 107 mg/dL — ABNORMAL HIGH (ref 70–99)
Potassium: 4.3 mmol/L (ref 3.5–5.1)
Sodium: 136 mmol/L (ref 135–145)
Total Bilirubin: 0.5 mg/dL (ref 0.3–1.2)
Total Protein: 7 g/dL (ref 6.5–8.1)

## 2022-07-16 LAB — CBC
HCT: 42.4 % (ref 39.0–52.0)
Hemoglobin: 14.3 g/dL (ref 13.0–17.0)
MCH: 31 pg (ref 26.0–34.0)
MCHC: 33.7 g/dL (ref 30.0–36.0)
MCV: 92 fL (ref 80.0–100.0)
Platelets: 269 10*3/uL (ref 150–400)
RBC: 4.61 MIL/uL (ref 4.22–5.81)
RDW: 14.4 % (ref 11.5–15.5)
WBC: 8.4 10*3/uL (ref 4.0–10.5)
nRBC: 0 % (ref 0.0–0.2)

## 2022-07-16 LAB — RESP PANEL BY RT-PCR (RSV, FLU A&B, COVID)  RVPGX2
Influenza A by PCR: NEGATIVE
Influenza B by PCR: NEGATIVE
Resp Syncytial Virus by PCR: NEGATIVE
SARS Coronavirus 2 by RT PCR: NEGATIVE

## 2022-07-16 LAB — LIPASE, BLOOD: Lipase: 11 U/L (ref 11–51)

## 2022-07-16 MED ORDER — ONDANSETRON HCL 4 MG/2ML IJ SOLN
4.0000 mg | Freq: Once | INTRAMUSCULAR | Status: DC
  Filled 2022-07-16: qty 2

## 2022-07-16 MED ORDER — LACTATED RINGERS IV BOLUS: 1000.0000 mL | Freq: Once | INTRAVENOUS | Status: DC

## 2022-07-16 MED ORDER — ACETAMINOPHEN 325 MG PO TABS
650.0000 mg | ORAL_TABLET | Freq: Once | ORAL | Status: DC
  Filled 2022-07-16: qty 2

## 2022-07-16 NOTE — ED Provider Notes (Signed)
[facility] Provider Note   CSN: [id number] Arrival date & time: 07/16/22  1607     History  Chief Complaint  Patient presents with   Nausea    Carlos Hanson is a 82 y.o. male.  82 year old male with a history of CAD who presents emergency department with nausea vomiting, diarrhea, and dizziness.  Reports that several days ago started experiencing nausea and vomiting.  Says that he only had several episodes in the vomiting has subsided.  Also had several loose stools.  Denies any abdominal pain.  Did feel dizzy 1 time when he was going to the store but denies any syncopal episodes.  Reports that he is overall feeling much better now but came to the emergency department because his wife is having similar symptoms that are more severe.  No fevers.  Denies any chest pain or shortness of breath.       Home Medications Prior to Admission medications   Medication Sig Start Date End Date Taking? Authorizing Provider  acetaminophen (TYLENOL) 500 MG tablet Take 2 tablets (1,000 mg total) by mouth every 8 (eight) hours. 03/13/18   Nita Sells, MD  amiodarone (PACERONE) 200 MG tablet Take 0.5 tablets (100 mg total) by mouth daily. 10/13/21   Adrian Prows, MD  atorvastatin (LIPITOR) 20 MG tablet Take 1 tablet by mouth daily. 07/22/18   [provider]  diltiazem (DILACOR XR) 120 MG 24 hr capsule Take 120 mg by mouth daily.    [provider]  DULoxetine (CYMBALTA) 30 MG capsule Take 3 capsules (90 mg total) by mouth daily. 03/13/18   Nita Sells, MD  famotidine (PEPCID) 20 MG tablet Take 20 mg by mouth daily. 04/04/21   [provider]  gabapentin (NEURONTIN) 100 MG capsule Take 1 capsule (100 mg total) by mouth 2 (two) times daily. 03/13/18   Nita Sells, MD  lactulose (CHRONULAC) 10 GM/15ML solution Take 30 mLs (20 g total) by mouth daily. 03/13/18   Nita Sells, MD  levothyroxine (SYNTHROID)  50 MCG tablet Take 50 mcg by mouth daily before breakfast.    [provider]  MELATONIN PO Take by mouth at bedtime.    [provider]  morphine (MS CONTIN) 15 MG 12 hr tablet Take 1 tablet (15 mg total) by mouth every 12 (twelve) hours. 03/13/18   Nita Sells, MD  Multiple Vitamins-Minerals (VISION FORMULA EYE HEALTH) CAPS Take 1 tablet by mouth 2 (two) times daily. [employer]    [provider]  oxyCODONE (OXY IR/ROXICODONE) 5 MG immediate release tablet Take 1 tablet (5 mg total) by mouth 3 (three) times daily as needed for moderate pain or severe pain. 03/13/18   Nita Sells, MD  oxymetazoline (AFRIN) 0.05 % nasal spray Place 1 spray into both nostrils at bedtime as needed for congestion (sinuses).    [provider]  XARELTO 20 MG TABS tablet TAKE 1 TABLET BY MOUTH DAILY WITH SUPPER. 07/09/21   Adrian Prows, MD      Allergies    Naprosyn [naproxen]    Review of Systems   Review of Systems  Physical Exam Updated Vital Signs BP 104/71 (BP Location: Left Arm)   Pulse 80   Temp 97.7 F (36.5 C) (Temporal)   Resp 16   Ht '5\' 11"'$  (1.803 m)   Wt 127 kg   SpO2 98%   BMI 39.05 kg/m  Physical Exam Vitals and nursing note reviewed.  Constitutional:  General: He is not in acute distress.    Appearance: He is well-developed.  HENT:     Head: Normocephalic and atraumatic.     Right Ear: External ear normal.     Left Ear: External ear normal.     Nose: Nose normal.  Eyes:     Extraocular Movements: Extraocular movements intact.     Conjunctiva/sclera: Conjunctivae normal.     Pupils: Pupils are equal, round, and reactive to light.  Pulmonary:     Effort: Pulmonary effort is normal. No respiratory distress.  Abdominal:     General: There is no distension.     Palpations: Abdomen is soft. There is no mass.     Tenderness: There is no abdominal tenderness. There is no guarding.  Musculoskeletal:     Cervical back: Normal  range of motion and neck supple.  Skin:    General: Skin is warm and dry.  Neurological:     Mental Status: He is alert. Mental status is at baseline.  Psychiatric:        Mood and Affect: Mood normal.        Behavior: Behavior normal.     ED Results / Procedures / Treatments   Labs (all labs ordered are listed, but only abnormal results are displayed) Labs Reviewed  COMPREHENSIVE METABOLIC PANEL - Abnormal; Notable for the following components:      Result Value   CO2 20 (*)    Glucose, Bld 107 (*)    BUN 25 (*)    Creatinine, Ser 1.40 (*)    Calcium 8.6 (*)    GFR, Estimated 50 (*)    All other components within normal limits  URINALYSIS, ROUTINE W REFLEX MICROSCOPIC - Abnormal; Notable for the following components:   Ketones, ur TRACE (*)    Protein, ur 30 (*)    All other components within normal limits  RESP PANEL BY RT-PCR (RSV, FLU A&B, COVID)  RVPGX2  LIPASE, BLOOD  CBC    EKG None  Radiology No results found.  Procedures Procedures    Medications Ordered in ED Medications  lactated ringers bolus 1,000 mL (1,000 mLs Intravenous Patient Refused/Not Given 07/16/22 1924)  ondansetron (ZOFRAN) injection 4 mg (4 mg Intravenous Patient Refused/Not Given 07/16/22 1925)  acetaminophen (TYLENOL) tablet 650 mg (650 mg Oral Patient Refused/Not Given 07/16/22 1925)    ED Course/ Medical Decision Making/ A&P                             Medical Decision Making Amount and/or Complexity of Data Reviewed Labs: ordered.  Risk OTC drugs. Prescription drug management.   Carlos Hanson is a 82 y.o. male with comorbidities that complicate the patient evaluation including CAD who presents emergency department with nausea, vomiting, and diarrhea  Initial Ddx:  Gastroenteritis, intra-abdominal abscess, dehydration, AKI, electrolyte abnormality  MDM:  With the patient sick contact feel that he likely has gastroenteritis.  Has reassuring abdominal exam do not feel that  cross-sectional imaging is indicated at this time.  Will check lab work to assess for AKI or electrolyte abnormality and give fluids and Zofran at this time.    Plan:  Labs IV fluids Zofran  ED Summary/Re-evaluation:  Patient reassessed and is feeling much better.  Patient refused IV fluids and Zofran and reported that he would like to go home.  Did have lab work drawn that showed that he had a possible AKI.  Patient  was notified of this but he reports that he still would like to go home.  Counseled the patient to stay well-hydrated and use Zofran for any nausea or vomiting that he may have.  Also instructed him to follow-up with his primary doctor in 2 to 3 days.  This patient presents to the ED for concern of complaints listed in HPI, this involves an extensive number of treatment options, and is a complaint that carries with it a high risk of complications and morbidity. Disposition including potential need for admission considered.   Dispo: DC Home. Return precautions discussed including, but not limited to, those listed in the AVS. Allowed pt time to ask questions which were answered fully prior to dc.  Additional history obtained from spouse Records reviewed Outpatient Clinic Notes The following labs were independently interpreted: Chemistry and show no acute abnormality I have reviewed the patients home medications and made adjustments as needed Social Determinants of health:  Elderly  Final Clinical Impression(s) / ED Diagnoses Final diagnoses:  Gastroenteritis    Rx / DC Orders ED Discharge Orders     None         Fransico Meadow, MD 07/16/22 1949

## 2022-07-16 NOTE — ED Notes (Addendum)
Pt refused IV fluids and stated he has Gatorade at home to drink. Pt refused Tylenol and stated he has Tylenol at home he can take. Pt refused Zofran and stated he has no nausea at this time. Pt stated he is hungry however and is ready to go home. Pt asked that I take out his IV so he can get ready to leave. I made his provider aware of same.

## 2022-07-16 NOTE — ED Triage Notes (Signed)
Pt arrives to ED with c/o nausea, fatigue, and headache x2 days. Notes his wife has similar symptoms. He denies emesis and diarrhea. He denies abd pain.

## 2022-07-16 NOTE — Discharge Instructions (Signed)
You were seen for your viral bug (gastroenteritis) in the emergency department.   At home, please take the Zofran for your nausea and vomiting.  Please be sure to stay well-hydrated.    Follow-up with your primary doctor in 2-3 days regarding your visit.    Return immediately to the emergency department if you experience any of the following: Dizziness or fainting, abdominal pain, high fevers, or any other concerning symptoms.    Thank you for visiting our Emergency Department. It was a pleasure taking care of you today.

## 2022-08-06 ENCOUNTER — Encounter: Payer: Self-pay | Admitting: Cardiology

## 2022-08-06 ENCOUNTER — Ambulatory Visit: Payer: PPO | Admitting: Cardiology

## 2022-08-06 VITALS — BP 122/76 | HR 65 | Resp 16 | Ht 71.0 in | Wt 267.0 lb

## 2022-08-06 DIAGNOSIS — I10 Essential (primary) hypertension: Secondary | ICD-10-CM

## 2022-08-06 DIAGNOSIS — G4733 Obstructive sleep apnea (adult) (pediatric): Secondary | ICD-10-CM | POA: Diagnosis not present

## 2022-08-06 DIAGNOSIS — N179 Acute kidney failure, unspecified: Secondary | ICD-10-CM | POA: Diagnosis not present

## 2022-08-06 DIAGNOSIS — I480 Paroxysmal atrial fibrillation: Secondary | ICD-10-CM | POA: Diagnosis not present

## 2022-08-06 DIAGNOSIS — I453 Trifascicular block: Secondary | ICD-10-CM | POA: Diagnosis not present

## 2022-08-06 NOTE — Progress Notes (Signed)
Primary Physician/Referring:  Donnajean Lopes, MD  Patient ID: Carlos Hanson, male    DOB: [birth year], 82 y.o.   MRN: [id number]  Chief Complaint  Patient presents with  . Paroxysmal atrial fibrillation ([place]). CHA2DS2VASc score 3,   . Follow-up   HPI:    Carlos Hanson  is a 82 y.o. Caucasian male  with  morbid obesity, hypertension, obstructive sleep apnea on CPAP and chronic back pain.  He has chronic dyspnea on exertion, presently tolerating low-dose amiodarone and Xarelto and has maintained sinus rhythm.  Presents here for annual visit.  He is accompanied by his wife.  He presents for annual visit, he has lost about 40 to 50 pounds in weight and has been swimming on a daily basis and has been eating less, he and his wife have moved from independent home to independent living and since then have made dietary changes.  Backache continues to be his major issue.  He has chronic dyspnea but no PND or orthopnea.  Since weight loss, he has had resolution of leg edema except right ankle which has had remote injury.   Past Medical History:  Diagnosis Date  . Arthritis   . Chronic back pain   . Coronary artery disease    40% mLAD/DIAG bifurcation 03/25/10   . Dysrhythmia    History of atrial fibrillation  . Gout   . H/O hiatal hernia   . Hearing decreased    right ear  . History of bladder infections   . History of bleeding ulcers   . History of ulcer disease   . Hypercholesteremia   . Nocturia   . Obstructive sleep apnea hypopnea, severe   . Sleep apnea    cpap sleep study 2011   Social History   Tobacco Use  . Smoking status: Former    Packs/day: 1.00    Years: 25.00    Additional pack years: 0.00    Total pack years: 25.00    Types: Cigarettes    Quit date: 07/13/2005    Years since quitting: 17.0  . Smokeless tobacco: Former    Types: Chew  . Tobacco comments:    Quit 8 years ago.  Substance Use Topics  . Alcohol use: Yes    Alcohol/week: 2.0 standard drinks of  alcohol    Types: 2 Shots of liquor per week    Comment: once a week   ROS  Review of Systems  Cardiovascular:  Positive for dyspnea on exertion and leg swelling. Negative for chest pain.  Gastrointestinal:  Negative for melena.   Objective  Blood pressure (!) 149/74, pulse 65, resp. rate 16, height 5\' 11"  (1.803 m), weight 267 lb (121.1 kg), SpO2 93 %.     08/06/2022    9:25 AM 07/16/2022    8:03 PM 07/16/2022    4:26 PM  Vitals with BMI  Height 5\' 11"     Weight 267 lbs    BMI [id number]    Systolic [id number]  Diastolic 74 72 71  Pulse 65 84 80     Physical Exam Constitutional:      Appearance: He is obese.     Comments: Morbidly obese  Neck:     Vascular: No JVD.  Cardiovascular:     Rate and Rhythm: Normal rate and regular rhythm. Frequent Extrasystoles are present.    Pulses:          Carotid pulses are 2+ on the right side and 2+ on the  left side.      Dorsalis pedis pulses are 2+ on the right side and 2+ on the left side.       Posterior tibial pulses are 2+ on the right side and 2+ on the left side.     Heart sounds: Normal heart sounds. No murmur heard.    No gallop.  Pulmonary:     Effort: Pulmonary effort is normal.     Breath sounds: Normal breath sounds.  Abdominal:     General: Bowel sounds are normal.     Palpations: Abdomen is soft.  Musculoskeletal:     Right lower leg: Edema (1-2+ right ankle edema) present.     Left lower leg: No edema.   Laboratory examination:   Lab Results  Component Value Date   NA 136 07/16/2022   K 4.3 07/16/2022   CO2 20 (L) 07/16/2022   GLUCOSE 107 (H) 07/16/2022   BUN 25 (H) 07/16/2022   CREATININE 1.40 (H) 07/16/2022   CALCIUM 8.6 (L) 07/16/2022   GFRNONAA 50 (L) 07/16/2022    Lab Results  Component Value Date   WBC 8.4 07/16/2022   HGB 14.3 07/16/2022   HCT 42.4 07/16/2022   MCV 92.0 07/16/2022   PLT 269 07/16/2022    External labs:   Cholesterol, total 117.000 m 02/24/2022 HDL 61.000 mg 02/24/2022 LDL  45.000 mg 02/24/2022 Triglycerides 54.000 mg 02/24/2022  A1C 5.400 % 02/24/2022 TSH 1.800 02/25/2022  Hemoglobin 14.300 g/d 07/16/2022 Platelets 269.000 K/ 07/16/2022  Creatinine, Serum 1.400 mg/ 07/16/2022 CrCl Est 42.58 07/16/2022 eGFR 52.800 02/24/2022  Potassium 4.300 mm 07/16/2022 ALT (SGPT) 15.000 U/L 07/16/2022  Labs 12/26/2020:  BUN 17, creatinine 1.1, EGFR 64 mill, potassium 4.5, LFTs normal.  Hb 14.6/HCT 42.0, platelets 314.  Total cholesterol 118, triglycerides 47, HDL 66, LDL 43.  TSH 1.87.    Radiology:   No results found.  Cardiac Studies:   Echocardiogram 03/26/2010: Left ventricle: The cavity size was normal. There was focal basal and moderate concentric hypertrophy. Systolic function was normal. The estimated ejection fraction was in the range of 60% to 65%. Doppler parameters are consistent with elevated mean left atrial filling pressure. - Ventricular septum: The contour showed systolic flattening.  - Left atrium: The atrium was moderately dilated.  - Right ventricle: The cavity size was mildly dilated.  Coronary angiogram 03/25/10: Normal LV. Slow flow in the  coronary arteries.  Cardioversion 05/01/10: Failed. transient sinus and back to A. Fib  Sleep Study 07/09/2014 Asencion Partridge Dohmeier, MD): Positive for OSA, pt uses CPAP.  EKG  EKG 08/06/2022: Sinus rhythm with first-degree AV block at the rate of 79 bpm, left atrial enlargement, left axis deviation, left anterior fascicular block.  Right bundle branch block.  Trifascicular block.  Frequent PVCs (4, unifocal x 3) No significant change from EKG 08/05/2021   Medications and allergies   Allergies  Allergen Reactions  . Celecoxib     Other Reaction(s): ineffective  . Penicillin G Sodium Nausea Only  . Naprosyn [Naproxen] Rash     Current Outpatient Medications:  .  acetaminophen (TYLENOL) 500 MG tablet, Take 2 tablets (1,000 mg total) by mouth every 8 (eight) hours., Disp: 30 tablet, Rfl: 0 .   amiodarone (PACERONE) 200 MG tablet, Take 0.5 tablets (100 mg total) by mouth daily., Disp: 90 tablet, Rfl: 1 .  atorvastatin (LIPITOR) 20 MG tablet, Take 1 tablet by mouth daily., Disp: , Rfl:  .  diltiazem (DILACOR XR) 120 MG 24 hr capsule, Take 120  mg by mouth daily., Disp: , Rfl:  .  doxycycline (VIBRAMYCIN) 100 MG capsule, Take 100 mg by mouth daily., Disp: , Rfl:  .  DULoxetine (CYMBALTA) 30 MG capsule, Take 3 capsules (90 mg total) by mouth daily., Disp: 6 capsule, Rfl: 0 .  famotidine (PEPCID) 20 MG tablet, Take 20 mg by mouth daily., Disp: , Rfl:  .  gabapentin (NEURONTIN) 300 MG capsule, Take 300 mg by mouth every morning., Disp: , Rfl:  .  lactulose (CHRONULAC) 10 GM/15ML solution, Take 30 mLs (20 g total) by mouth daily., Disp: 240 mL, Rfl: 1 .  levothyroxine (SYNTHROID) 50 MCG tablet, Take 50 mcg by mouth daily before breakfast., Disp: , Rfl:  .  MELATONIN PO, Take by mouth at bedtime., Disp: , Rfl:  .  morphine (MS CONTIN) 15 MG 12 hr tablet, Take 1 tablet (15 mg total) by mouth every 12 (twelve) hours., Disp: 6 tablet, Rfl: 0 .  Multiple Vitamins-Minerals (VISION FORMULA EYE HEALTH) CAPS, Take 1 tablet by mouth 2 (two) times daily. [employer], Disp: , Rfl:  .  oxyCODONE (OXY IR/ROXICODONE) 5 MG immediate release tablet, Take 1 tablet (5 mg total) by mouth 3 (three) times daily as needed for moderate pain or severe pain., Disp: 8 tablet, Rfl: 0 .  oxymetazoline (AFRIN) 0.05 % nasal spray, Place 1 spray into both nostrils at bedtime as needed for congestion (sinuses)., Disp: , Rfl:  .  XARELTO 20 MG TABS tablet, TAKE 1 TABLET BY MOUTH DAILY WITH SUPPER., Disp: 90 tablet, Rfl: 3   Assessment     ICD-10-CM   1. Paroxysmal atrial fibrillation ([place]). CHA2DS2VASc score 3, annual stroke risk 3.2% (A, HTN).  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 Essential hypertension  I10     4. OSA on CPAP  G47.33     5. AKI (acute kidney injury)  N17.9      CHA2DS2-VASc  Score is 3.  Yearly risk of stroke: 3.2% (A, HTN).  Score of 1=0.6; 2=2.2; 3=3.2; 4=4.8; 5=7.2; 6=9.8; 7=>9.8) -(CHF; HTN; vasc disease DM,  Male = 1; Age <65 =0; 65-74 = 1,  >75 =2; stroke/embolism= 2).    No orders of the defined types were placed in this encounter.  Medications Discontinued During This Encounter  Medication Reason  . gabapentin (NEURONTIN) 100 MG capsule Dose change   Orders Placed This Encounter  Procedures  . EKG 12-Lead     Recommendations:   KAGE DULL  is a 82 y.o. Caucasian male  with  morbid obesity, hypertension, obstructive sleep apnea on CPAP and chronic back pain.  He has chronic dyspnea on exertion, presently tolerating low-dose amiodarone and Xarelto and has maintained sinus rhythm.  Presents here for annual visit.  He is accompanied by his wife.  1. Paroxysmal atrial fibrillation ([place]). CHA2DS2VASc score 3, annual stroke risk 3.2% (A, HTN). Patient is maintaining sinus rhythm, he has underlying trifascicular block, this has been present for many years and he has not had any symptomatic high degree AV block.  He is on a low-dose of a diltiazem CD both for hypertension and PAF with RVR, continue the same for now.  He remains asymptomatic.  Fortunately he is also lost about 40 to 50 pounds in weight with regular exercise and has cut down on his diet as well.  - EKG 12-Lead  2. Trifascicular block No change in his EKG, has bradycardia and trifascicular block, continue observation.  3. Essential hypertension Blood pressure under excellent control, he is only on diltiazem CD1 120 mg daily.  4. AKI (acute kidney injury) I reviewed his records from recent hospitalization in March 2024, he presented with what appears to be a notable virus infection and was probably dehydrated developed acute renal failure with serum creatinine rising up to 1.4.  He has not had a follow-up with this, I did offer to place labs, he would prefer to have this done by Dr. Janie Morning, his PCP, I have advised him to go ahead and get his labs done for his upcoming complete physical sooner but can still keep his appointment later.  5. OSA on CPAP He is compliant with CPAP.   Other orders - doxycycline (VIBRAMYCIN) 100 MG capsule; Take 100 mg by mouth daily. - gabapentin (NEURONTIN) 300 MG capsule; Take 300 mg by mouth every morning.     Adrian Prows, MD, [facility] 08/06/2022, 10:16 AM Office: [phone] Pager: [phone]

## 2022-08-11 ENCOUNTER — Other Ambulatory Visit: Payer: Self-pay | Admitting: Cardiology

## 2022-08-14 DIAGNOSIS — I10 Essential (primary) hypertension: Secondary | ICD-10-CM | POA: Diagnosis not present

## 2022-08-14 DIAGNOSIS — I480 Paroxysmal atrial fibrillation: Secondary | ICD-10-CM | POA: Diagnosis not present

## 2022-08-14 DIAGNOSIS — R600 Localized edema: Secondary | ICD-10-CM | POA: Diagnosis not present

## 2022-08-14 DIAGNOSIS — L03115 Cellulitis of right lower limb: Secondary | ICD-10-CM | POA: Diagnosis not present

## 2022-09-14 NOTE — Progress Notes (Unsigned)
PATIENT: [name] DOB: 1941/01/03  REASON FOR VISIT: follow up HISTORY FROM: patient PRIMARY NEUROLOGIST: Dr Dohmeier  Chief Complaint  Patient presents with   Follow-up    Rm ;19, alone.  Cpap follow up.   Concern: sporadic dry mouth.  ESS 9.      HISTORY OF PRESENT ILLNESS: Today 09/15/22:  NEHORAI REHBEIN is a 82 y.o. male with a history of OSA on CPAP. Returns today for follow-up.  Reports that the CPAP continues to work well.  He states on occasion he will have dry mouth.  He does report some issues with dizziness with standing.  Plans to discuss with his PCP.  He returns today for an evaluation.       05/06/21: Mr. Traub is an 82 year old male with a history of obstructive sleep apnea on CPAP.  He returns today for follow-up.  The patient has not been seen in our office since 2017.  He is here to reestablish care for his sleep apnea.  He brought his machine with him.  His download is below.  His initial sleep study was in 2016.  He denies any new issues.  Reports that his machine is telling him that it is at the end of its life.    HISTORY (Copied from Dr.Dohmeier's note)  Mr. Denault is a 82 year old male with a history of obstructive sleep apnea on CPAP. Seen in a RV today, 16 May 2015 Mr. Cordial underwent a CPAP titration on 03/19/2015.  His baseline study is quoted  below. The results were delayed because the study had to be rescored,  Without  additional need of oxygen. He had been classified at being at a high risk of central and lower obstructive sleep apnea. The patient was titrated to 12 cm water pressure which resulted in an AHI of 0.0 a ResMed air fit P 10 and nasal pillow was used in large size. Oxygen supplement was no longer necessary once the CPAP reached a pressure above 10 cm. The average heart rate remained at 66 bpm in normal sinus rhythm. He did have a few limb movement related arousals but those were clinically insignificant. He had only 3 minutes of  oxygen desaturation for a total sleep. Of 281 minutes. I would like to add that the tox the patient almost 60 minutes to finally fall asleep.   A compliance report is available and the patient has used a CPAP for 30 out of the last 30 days with 100% compliance at average use of 9 hours and 33 minutes each night he is using an AutoSet between 4 and 10 cm water pressure with full-time EPR his residual AHI is 1.6 he has very few air leaks and overall his pressure needs each night seem to be just around 10 cm water. He has felt more alert. He has not having trouble sleeping through the night, aside form the lab test night. The patient does not have comorbidities such as diabetes or hypertension,  but is listed as having some back pain and gout, paroxysmal atrial fibrillation's in the past. Back pain is chronic.        REVIEW OF SYSTEMS: Out of a complete 14 system review of symptoms, the patient complains only of the following symptoms, and all other reviewed systems are negative.   ESS 9  ALLERGIES: Allergies  Allergen Reactions   Celecoxib     Other Reaction(s): ineffective   Penicillin G Sodium Nausea Only   Naprosyn [Naproxen] Rash  HOME MEDICATIONS: Outpatient Medications Prior to Visit  Medication Sig Dispense Refill   acetaminophen (TYLENOL) 500 MG tablet Take 2 tablets (1,000 mg total) by mouth every 8 (eight) hours. 30 tablet 0   amiodarone (PACERONE) 200 MG tablet Take 0.5 tablets (100 mg total) by mouth daily. 90 tablet 1   atorvastatin (LIPITOR) 20 MG tablet Take 1 tablet by mouth daily.     diltiazem (DILACOR XR) 120 MG 24 hr capsule Take 120 mg by mouth daily.     doxycycline (VIBRAMYCIN) 100 MG capsule Take 100 mg by mouth daily.     DULoxetine (CYMBALTA) 30 MG capsule Take 3 capsules (90 mg total) by mouth daily. 6 capsule 0   famotidine (PEPCID) 20 MG tablet Take 20 mg by mouth daily.     gabapentin (NEURONTIN) 300 MG capsule Take 300 mg by mouth every morning.      lactulose (CHRONULAC) 10 GM/15ML solution Take 30 mLs (20 g total) by mouth daily. 240 mL 1   levothyroxine (SYNTHROID) 50 MCG tablet Take 50 mcg by mouth daily before breakfast.     MELATONIN PO Take by mouth at bedtime.     morphine (MS CONTIN) 15 MG 12 hr tablet Take 1 tablet (15 mg total) by mouth every 12 (twelve) hours. 6 tablet 0   Multiple Vitamins-Minerals (VISION FORMULA EYE HEALTH) CAPS Take 1 tablet by mouth 2 (two) times daily. [employer]     oxyCODONE (OXY IR/ROXICODONE) 5 MG immediate release tablet Take 1 tablet (5 mg total) by mouth 3 (three) times daily as needed for moderate pain or severe pain. 8 tablet 0   oxymetazoline (AFRIN) 0.05 % nasal spray Place 1 spray into both nostrils at bedtime as needed for congestion (sinuses).     XARELTO 20 MG TABS tablet TAKE 1 TABLET BY MOUTH DAILY WITH SUPPER 90 tablet 3   No facility-administered medications prior to visit.    PAST MEDICAL HISTORY: Past Medical History:  Diagnosis Date   Arthritis    Chronic back pain    Coronary artery disease    40% mLAD/DIAG bifurcation 03/25/10    Dysrhythmia    History of atrial fibrillation   Gout    H/O hiatal hernia    Hearing decreased    right ear   History of bladder infections    History of bleeding ulcers    History of ulcer disease    Hypercholesteremia    Nocturia    Obstructive sleep apnea hypopnea, severe    Sleep apnea    cpap sleep study 2011    PAST SURGICAL HISTORY: Past Surgical History:  Procedure Laterality Date   BACK SURGERY     CARDIAC CATHETERIZATION     2011   cardioverson     COLONOSCOPY  08/27/2011   Procedure: COLONOSCOPY;  Surgeon: Malissa Hippo, MD;  Location: [facility];  Service: Endoscopy;  Laterality: N/A;  930   ESOPHAGOGASTRODUODENOSCOPY     JOINT REPLACEMENT     left shoulder   Left shoulder replacement     LUMBAR LAMINECTOMY  03/15/2012   Procedure: LUMBAR LAMINECTOMY WITH  X-STOP 2 LEVEL;  Surgeon: Barnett Abu, MD;  Location:  [facility];  Service: Neurosurgery;  Laterality: N/A;  Lumbar two-three, three-four XSTOP   TONSILLECTOMY      FAMILY HISTORY: Family History  Problem Relation Age of Onset   Heart failure Mother    Diabetes Mother    Arthritis Mother    High blood pressure  Mother    Heart failure Father    Colon cancer Neg Hx    Sleep apnea Neg Hx     SOCIAL HISTORY: Social History   Socioeconomic History   Marital status: Married    Spouse name: Eber Jones    Number of children: 3   Years of education: college   Highest education level: Not on file  Occupational History    Comment: Retired  Tobacco Use   Smoking status: Former    Packs/day: 1.00    Years: 25.00    Additional pack years: 0.00    Total pack years: 25.00    Types: Cigarettes    Quit date: 07/13/2005    Years since quitting: 17.1   Smokeless tobacco: Former    Types: Chew   Tobacco comments:    Quit 8 years ago.  Vaping Use   Vaping Use: Never used  Substance and Sexual Activity   Alcohol use: Yes    Alcohol/week: 2.0 standard drinks of alcohol    Types: 2 Shots of liquor per week    Comment: once a week   Drug use: No   Sexual activity: Not on file  Other Topics Concern   Not on file  Social History Narrative   Patient lives at home with his wife Eber Jones).    Retired.   Education college.   Right handed.   Caffeine one cup of coffee daily.   Social Determinants of Health   Financial Resource Strain: Not on file  Food Insecurity: Not on file  Transportation Needs: Not on file  Physical Activity: Not on file  Stress: Not on file  Social Connections: Not on file  Intimate Partner Violence: Not on file      PHYSICAL EXAM  Vitals:   09/15/22 1035  BP: (!) 146/81  Pulse: 77  Weight: 295 lb 8 oz (134 kg)  Height: 6' (1.829 m)   Body mass index is 40.08 kg/m.  Generalized: Well developed, in no acute distress  Chest: Lungs clear to auscultation bilaterally  Neurological examination   Mentation: Alert oriented to time, place, history taking. Follows all commands speech and language fluent Cranial nerve II-XII: Facial symmetry noted  DIAGNOSTIC DATA (LABS, IMAGING, TESTING) - I reviewed patient records, labs, notes, testing and imaging myself where available.  Lab Results  Component Value Date   WBC 8.4 07/16/2022   HGB 14.3 07/16/2022   HCT 42.4 07/16/2022   MCV 92.0 07/16/2022   PLT 269 07/16/2022      Component Value Date/Time   NA 136 07/16/2022 1626   NA 138 06/27/2018 1209   K 4.3 07/16/2022 1626   CL 106 07/16/2022 1626   CO2 20 (L) 07/16/2022 1626   GLUCOSE 107 (H) 07/16/2022 1626   BUN 25 (H) 07/16/2022 1626   BUN 19 06/27/2018 1209   CREATININE 1.40 (H) 07/16/2022 1626   CALCIUM 8.6 (L) 07/16/2022 1626   PROT 7.0 07/16/2022 1626   PROT 6.2 06/27/2018 1209   ALBUMIN 3.8 07/16/2022 1626   ALBUMIN 4.1 06/27/2018 1209   AST 17 07/16/2022 1626   ALT 15 07/16/2022 1626   ALKPHOS 44 07/16/2022 1626   BILITOT 0.5 07/16/2022 1626   BILITOT 0.3 06/27/2018 1209   GFRNONAA 50 (L) 07/16/2022 1626   GFRAA 77 06/27/2018 1209   Lab Results  Component Value Date   CHOL  03/26/2010    152        ATP III CLASSIFICATION:  <200  mg/dL   Desirable  161-096  mg/dL   Borderline High  >=045    mg/dL   High          HDL 47 03/26/2010   LDLCALC  03/26/2010    80        Total Cholesterol/HDL:CHD Risk Coronary Heart Disease Risk Table                     Men   Women  1/2 Average Risk   3.4   3.3  Average Risk       5.0   4.4  2 X Average Risk   9.6   7.1  3 X Average Risk  23.4   11.0        Use the calculated Patient Ratio above and the CHD Risk Table to determine the patient's CHD Risk.        ATP III CLASSIFICATION (LDL):  <100     mg/dL   Optimal  409-811  mg/dL   Near or Above                    Optimal  130-159  mg/dL   Borderline  914-782  mg/dL   High  >956     mg/dL   Very High   TRIG 213 03/26/2010   CHOLHDL 3.2 03/26/2010   Lab  Results  Component Value Date   TSH 6.080 (H) 06/27/2018      ASSESSMENT AND PLAN 82 y.o. year old male  has a past medical history of Arthritis, Chronic back pain, Coronary artery disease, Dysrhythmia, Gout, H/O hiatal hernia, Hearing decreased, History of bladder infections, History of bleeding ulcers, History of ulcer disease, Hypercholesteremia, Nocturia, Obstructive sleep apnea hypopnea, severe, and Sleep apnea. here with:  OSA on CPAP  - CPAP compliance excellent - Good treatment of AHI  - Encourage patient to use CPAP nightly and > 4 hours each night - F/U in 6 months or sooner if needed    Butch Penny, MSN, NP-C 09/15/2022, 10:50 AM [facility] [address], [address] [place], [name] [address] [phone]

## 2022-09-15 ENCOUNTER — Ambulatory Visit: Payer: PPO | Admitting: Adult Health

## 2022-09-15 ENCOUNTER — Encounter: Payer: Self-pay | Admitting: Adult Health

## 2022-09-15 VITALS — BP 146/81 | HR 77 | Ht 72.0 in | Wt 295.5 lb

## 2022-09-15 DIAGNOSIS — G4733 Obstructive sleep apnea (adult) (pediatric): Secondary | ICD-10-CM

## 2022-09-15 NOTE — Patient Instructions (Signed)
Continue using CPAP nightly and greater than 4 hours each night °If your symptoms worsen or you develop new symptoms please let us know.  ° °

## 2022-10-08 ENCOUNTER — Other Ambulatory Visit: Payer: Self-pay | Admitting: Cardiology

## 2022-10-08 DIAGNOSIS — I480 Paroxysmal atrial fibrillation: Secondary | ICD-10-CM

## 2022-10-08 NOTE — Telephone Encounter (Signed)
Refill

## 2022-10-13 DIAGNOSIS — G4733 Obstructive sleep apnea (adult) (pediatric): Secondary | ICD-10-CM | POA: Diagnosis not present

## 2022-12-10 DIAGNOSIS — K5903 Drug induced constipation: Secondary | ICD-10-CM | POA: Diagnosis not present

## 2022-12-10 DIAGNOSIS — M545 Low back pain, unspecified: Secondary | ICD-10-CM | POA: Diagnosis not present

## 2022-12-10 DIAGNOSIS — I10 Essential (primary) hypertension: Secondary | ICD-10-CM | POA: Diagnosis not present

## 2022-12-10 DIAGNOSIS — F1120 Opioid dependence, uncomplicated: Secondary | ICD-10-CM | POA: Diagnosis not present

## 2022-12-10 DIAGNOSIS — R6889 Other general symptoms and signs: Secondary | ICD-10-CM | POA: Diagnosis not present

## 2022-12-10 DIAGNOSIS — Z7901 Long term (current) use of anticoagulants: Secondary | ICD-10-CM | POA: Diagnosis not present

## 2022-12-10 DIAGNOSIS — R7301 Impaired fasting glucose: Secondary | ICD-10-CM | POA: Diagnosis not present

## 2022-12-10 DIAGNOSIS — R42 Dizziness and giddiness: Secondary | ICD-10-CM | POA: Diagnosis not present

## 2022-12-10 DIAGNOSIS — R1313 Dysphagia, pharyngeal phase: Secondary | ICD-10-CM | POA: Diagnosis not present

## 2022-12-10 DIAGNOSIS — I480 Paroxysmal atrial fibrillation: Secondary | ICD-10-CM | POA: Diagnosis not present

## 2022-12-16 NOTE — Telephone Encounter (Signed)
No action done

## 2023-01-14 ENCOUNTER — Other Ambulatory Visit: Payer: Self-pay | Admitting: Internal Medicine

## 2023-01-14 DIAGNOSIS — R1313 Dysphagia, pharyngeal phase: Secondary | ICD-10-CM

## 2023-01-26 ENCOUNTER — Ambulatory Visit
Admission: RE | Admit: 2023-01-26 | Discharge: 2023-01-26 | Disposition: A | Discharge status: Home / Self Care | Payer: PPO | Source: Ambulatory Visit | Attending: Internal Medicine | Admitting: Internal Medicine

## 2023-01-26 DIAGNOSIS — R1313 Dysphagia, pharyngeal phase: Secondary | ICD-10-CM

## 2023-01-26 DIAGNOSIS — R1319 Other dysphagia: Secondary | ICD-10-CM | POA: Diagnosis not present

## 2023-01-26 DIAGNOSIS — K224 Dyskinesia of esophagus: Secondary | ICD-10-CM | POA: Diagnosis not present

## 2023-02-08 ENCOUNTER — Telehealth: Payer: Self-pay | Admitting: Cardiology

## 2023-02-08 NOTE — Telephone Encounter (Addendum)
Cannot sample pt until the end of the year. Ok to provide a week or two of samples if he applies for their Xarelto with Me Coverage Gap Support program. It runs from April 1-December 31. Patients in the coverage gap can get Xarelto for $89 (for 30 days) or $250 (for 90 days). They can call to enroll at: 888-XARELTO [phone]). His Xarelto copay will return to normal on January 1. His 20mg  daily dosing is correct.

## 2023-02-08 NOTE — Telephone Encounter (Signed)
  Patient calling the office for samples of medication:   1.  What medication and dosage are you requesting samples for?  XARELTO 20 MG TABS tablet    2.  Are you currently out of this medication? Yes   Pt said, xarelto is getting expensive and would like to ask Dr. Jacinto Halim if he can get samples.    Also, he would like to ask him if he would like to change the prescription to a different blood thinner

## 2023-02-09 MED ORDER — RIVAROXABAN 20 MG PO TABS: 20.0000 mg | ORAL_TABLET | Freq: Every day | ORAL | Status: DC

## 2023-02-09 NOTE — Telephone Encounter (Signed)
Leaving pt 2 weeks of Xarelto 20 mg samples and Xarelto with Me Coverage Gap information for pt to contact, to get medication cheaper, at [address] [facility] front desk for pt to pick up. Called pt and left message informing pt of this.

## 2023-02-09 NOTE — Telephone Encounter (Signed)
Called pt to discuss pt applying for the coverage gap program for Xarelto. Pt stated that he would give [place] a call back concerning this matter.

## 2023-03-10 DIAGNOSIS — R109 Unspecified abdominal pain: Secondary | ICD-10-CM | POA: Diagnosis not present

## 2023-03-10 DIAGNOSIS — N1831 Chronic kidney disease, stage 3a: Secondary | ICD-10-CM | POA: Diagnosis not present

## 2023-03-10 DIAGNOSIS — G8929 Other chronic pain: Secondary | ICD-10-CM | POA: Diagnosis not present

## 2023-03-10 DIAGNOSIS — R809 Proteinuria, unspecified: Secondary | ICD-10-CM | POA: Diagnosis not present

## 2023-03-10 DIAGNOSIS — F1120 Opioid dependence, uncomplicated: Secondary | ICD-10-CM | POA: Diagnosis not present

## 2023-05-04 DIAGNOSIS — Z125 Encounter for screening for malignant neoplasm of prostate: Secondary | ICD-10-CM | POA: Diagnosis not present

## 2023-05-04 DIAGNOSIS — N1831 Chronic kidney disease, stage 3a: Secondary | ICD-10-CM | POA: Diagnosis not present

## 2023-05-04 DIAGNOSIS — I10 Essential (primary) hypertension: Secondary | ICD-10-CM | POA: Diagnosis not present

## 2023-05-04 DIAGNOSIS — E039 Hypothyroidism, unspecified: Secondary | ICD-10-CM | POA: Diagnosis not present

## 2023-05-04 DIAGNOSIS — E785 Hyperlipidemia, unspecified: Secondary | ICD-10-CM | POA: Diagnosis not present

## 2023-05-04 DIAGNOSIS — M109 Gout, unspecified: Secondary | ICD-10-CM | POA: Diagnosis not present

## 2023-05-04 DIAGNOSIS — R7301 Impaired fasting glucose: Secondary | ICD-10-CM | POA: Diagnosis not present

## 2023-05-11 ENCOUNTER — Other Ambulatory Visit: Payer: Self-pay

## 2023-05-11 ENCOUNTER — Other Ambulatory Visit (HOSPITAL_COMMUNITY): Payer: Self-pay

## 2023-05-11 DIAGNOSIS — F1120 Opioid dependence, uncomplicated: Secondary | ICD-10-CM | POA: Diagnosis not present

## 2023-05-11 DIAGNOSIS — R82998 Other abnormal findings in urine: Secondary | ICD-10-CM | POA: Diagnosis not present

## 2023-05-11 DIAGNOSIS — I480 Paroxysmal atrial fibrillation: Secondary | ICD-10-CM | POA: Diagnosis not present

## 2023-05-11 DIAGNOSIS — R809 Proteinuria, unspecified: Secondary | ICD-10-CM | POA: Diagnosis not present

## 2023-05-11 DIAGNOSIS — I10 Essential (primary) hypertension: Secondary | ICD-10-CM | POA: Diagnosis not present

## 2023-05-11 DIAGNOSIS — M545 Low back pain, unspecified: Secondary | ICD-10-CM | POA: Diagnosis not present

## 2023-05-11 DIAGNOSIS — E785 Hyperlipidemia, unspecified: Secondary | ICD-10-CM | POA: Diagnosis not present

## 2023-05-11 DIAGNOSIS — Z0000 Encounter for general adult medical examination without abnormal findings: Secondary | ICD-10-CM | POA: Diagnosis not present

## 2023-05-11 DIAGNOSIS — G4733 Obstructive sleep apnea (adult) (pediatric): Secondary | ICD-10-CM | POA: Diagnosis not present

## 2023-05-11 DIAGNOSIS — M25551 Pain in right hip: Secondary | ICD-10-CM | POA: Diagnosis not present

## 2023-05-11 DIAGNOSIS — Z7901 Long term (current) use of anticoagulants: Secondary | ICD-10-CM | POA: Diagnosis not present

## 2023-05-11 MED ORDER — MORPHINE SULFATE ER 15 MG PO TBCR
15.0000 mg | EXTENDED_RELEASE_TABLET | Freq: Two times a day (BID) | ORAL | 0 refills | Status: DC
  Filled 2023-05-11 (×2): qty 60, 30d supply, fill #0

## 2023-05-12 ENCOUNTER — Other Ambulatory Visit (HOSPITAL_COMMUNITY): Payer: Self-pay

## 2023-05-13 ENCOUNTER — Other Ambulatory Visit (HOSPITAL_BASED_OUTPATIENT_CLINIC_OR_DEPARTMENT_OTHER): Payer: Self-pay

## 2023-05-13 ENCOUNTER — Other Ambulatory Visit: Payer: Self-pay

## 2023-05-13 MED ORDER — MORPHINE SULFATE ER 15 MG PO TBCR
15.0000 mg | EXTENDED_RELEASE_TABLET | Freq: Two times a day (BID) | ORAL | 0 refills | Status: DC | PRN
  Filled 2023-05-13: qty 60, 30d supply, fill #0

## 2023-05-13 MED ORDER — OXYCONTIN 10 MG PO T12A
10.0000 mg | EXTENDED_RELEASE_TABLET | Freq: Two times a day (BID) | ORAL | 0 refills | Status: DC
  Filled 2023-05-13 – 2023-05-14 (×2): qty 60, 30d supply, fill #0

## 2023-05-14 ENCOUNTER — Other Ambulatory Visit (HOSPITAL_BASED_OUTPATIENT_CLINIC_OR_DEPARTMENT_OTHER): Payer: Self-pay

## 2023-05-14 ENCOUNTER — Other Ambulatory Visit: Payer: Self-pay

## 2023-05-14 ENCOUNTER — Other Ambulatory Visit (HOSPITAL_COMMUNITY): Payer: Self-pay

## 2023-05-14 DIAGNOSIS — G4733 Obstructive sleep apnea (adult) (pediatric): Secondary | ICD-10-CM | POA: Diagnosis not present

## 2023-05-14 DIAGNOSIS — R809 Proteinuria, unspecified: Secondary | ICD-10-CM | POA: Diagnosis not present

## 2023-05-15 ENCOUNTER — Other Ambulatory Visit (HOSPITAL_BASED_OUTPATIENT_CLINIC_OR_DEPARTMENT_OTHER): Payer: Self-pay

## 2023-06-01 DIAGNOSIS — Z961 Presence of intraocular lens: Secondary | ICD-10-CM | POA: Diagnosis not present

## 2023-06-01 DIAGNOSIS — H26493 Other secondary cataract, bilateral: Secondary | ICD-10-CM | POA: Diagnosis not present

## 2023-06-01 DIAGNOSIS — H43813 Vitreous degeneration, bilateral: Secondary | ICD-10-CM | POA: Diagnosis not present

## 2023-06-01 DIAGNOSIS — H353130 Nonexudative age-related macular degeneration, bilateral, stage unspecified: Secondary | ICD-10-CM | POA: Diagnosis not present

## 2023-06-17 DIAGNOSIS — M19011 Primary osteoarthritis, right shoulder: Secondary | ICD-10-CM | POA: Diagnosis not present

## 2023-06-21 DIAGNOSIS — M48062 Spinal stenosis, lumbar region with neurogenic claudication: Secondary | ICD-10-CM | POA: Diagnosis not present

## 2023-06-29 DIAGNOSIS — B353 Tinea pedis: Secondary | ICD-10-CM | POA: Diagnosis not present

## 2023-07-11 ENCOUNTER — Observation Stay (HOSPITAL_COMMUNITY)
Admission: EM | Admit: 2023-07-11 | Discharge: 2023-07-12 | Disposition: A | Discharge status: Home / Self Care | Attending: Internal Medicine | Admitting: Internal Medicine

## 2023-07-11 ENCOUNTER — Inpatient Hospital Stay (HOSPITAL_COMMUNITY)

## 2023-07-11 ENCOUNTER — Emergency Department (HOSPITAL_COMMUNITY)

## 2023-07-11 ENCOUNTER — Encounter (HOSPITAL_COMMUNITY): Payer: Self-pay

## 2023-07-11 ENCOUNTER — Other Ambulatory Visit: Payer: Self-pay

## 2023-07-11 DIAGNOSIS — R410 Disorientation, unspecified: Secondary | ICD-10-CM | POA: Insufficient documentation

## 2023-07-11 DIAGNOSIS — G4733 Obstructive sleep apnea (adult) (pediatric): Secondary | ICD-10-CM | POA: Diagnosis not present

## 2023-07-11 DIAGNOSIS — Z87891 Personal history of nicotine dependence: Secondary | ICD-10-CM | POA: Insufficient documentation

## 2023-07-11 DIAGNOSIS — I480 Paroxysmal atrial fibrillation: Secondary | ICD-10-CM | POA: Diagnosis not present

## 2023-07-11 DIAGNOSIS — Z6841 Body Mass Index (BMI) 40.0 and over, adult: Secondary | ICD-10-CM | POA: Insufficient documentation

## 2023-07-11 DIAGNOSIS — K449 Diaphragmatic hernia without obstruction or gangrene: Secondary | ICD-10-CM | POA: Diagnosis not present

## 2023-07-11 DIAGNOSIS — R0902 Hypoxemia: Secondary | ICD-10-CM | POA: Diagnosis not present

## 2023-07-11 DIAGNOSIS — R509 Fever, unspecified: Secondary | ICD-10-CM

## 2023-07-11 DIAGNOSIS — I2510 Atherosclerotic heart disease of native coronary artery without angina pectoris: Secondary | ICD-10-CM | POA: Diagnosis not present

## 2023-07-11 DIAGNOSIS — G2581 Restless legs syndrome: Secondary | ICD-10-CM | POA: Diagnosis present

## 2023-07-11 DIAGNOSIS — E662 Morbid (severe) obesity with alveolar hypoventilation: Secondary | ICD-10-CM

## 2023-07-11 DIAGNOSIS — E66813 Obesity, class 3: Secondary | ICD-10-CM | POA: Insufficient documentation

## 2023-07-11 DIAGNOSIS — Z79899 Other long term (current) drug therapy: Secondary | ICD-10-CM | POA: Diagnosis not present

## 2023-07-11 DIAGNOSIS — I959 Hypotension, unspecified: Secondary | ICD-10-CM | POA: Diagnosis not present

## 2023-07-11 DIAGNOSIS — R000 Tachycardia, unspecified: Secondary | ICD-10-CM | POA: Diagnosis not present

## 2023-07-11 DIAGNOSIS — K219 Gastro-esophageal reflux disease without esophagitis: Secondary | ICD-10-CM | POA: Diagnosis not present

## 2023-07-11 DIAGNOSIS — A419 Sepsis, unspecified organism: Principal | ICD-10-CM | POA: Diagnosis present

## 2023-07-11 DIAGNOSIS — E039 Hypothyroidism, unspecified: Secondary | ICD-10-CM | POA: Diagnosis present

## 2023-07-11 DIAGNOSIS — J189 Pneumonia, unspecified organism: Secondary | ICD-10-CM | POA: Diagnosis present

## 2023-07-11 DIAGNOSIS — J181 Lobar pneumonia, unspecified organism: Secondary | ICD-10-CM | POA: Insufficient documentation

## 2023-07-11 DIAGNOSIS — I10 Essential (primary) hypertension: Secondary | ICD-10-CM | POA: Diagnosis not present

## 2023-07-11 DIAGNOSIS — R4182 Altered mental status, unspecified: Secondary | ICD-10-CM | POA: Diagnosis not present

## 2023-07-11 DIAGNOSIS — E785 Hyperlipidemia, unspecified: Secondary | ICD-10-CM | POA: Diagnosis present

## 2023-07-11 DIAGNOSIS — R062 Wheezing: Secondary | ICD-10-CM | POA: Diagnosis present

## 2023-07-11 DIAGNOSIS — G894 Chronic pain syndrome: Secondary | ICD-10-CM | POA: Diagnosis present

## 2023-07-11 DIAGNOSIS — I6523 Occlusion and stenosis of bilateral carotid arteries: Secondary | ICD-10-CM | POA: Diagnosis not present

## 2023-07-11 DIAGNOSIS — J9601 Acute respiratory failure with hypoxia: Secondary | ICD-10-CM | POA: Diagnosis not present

## 2023-07-11 DIAGNOSIS — R059 Cough, unspecified: Secondary | ICD-10-CM | POA: Diagnosis present

## 2023-07-11 DIAGNOSIS — Z96612 Presence of left artificial shoulder joint: Secondary | ICD-10-CM | POA: Diagnosis not present

## 2023-07-11 DIAGNOSIS — R0689 Other abnormalities of breathing: Secondary | ICD-10-CM | POA: Diagnosis present

## 2023-07-11 DIAGNOSIS — R0989 Other specified symptoms and signs involving the circulatory and respiratory systems: Secondary | ICD-10-CM | POA: Diagnosis not present

## 2023-07-11 LAB — I-STAT CHEM 8, ED
BUN: 47 mg/dL — ABNORMAL HIGH (ref 8–23)
Calcium, Ion: 1.08 mmol/L — ABNORMAL LOW (ref 1.15–1.40)
Chloride: 111 mmol/L (ref 98–111)
Creatinine, Ser: 1.9 mg/dL — ABNORMAL HIGH (ref 0.61–1.24)
Glucose, Bld: 76 mg/dL (ref 70–99)
HCT: 36 % — ABNORMAL LOW (ref 39.0–52.0)
Hemoglobin: 12.2 g/dL — ABNORMAL LOW (ref 13.0–17.0)
Potassium: 5.2 mmol/L — ABNORMAL HIGH (ref 3.5–5.1)
Sodium: 140 mmol/L (ref 135–145)
TCO2: 24 mmol/L (ref 22–32)

## 2023-07-11 LAB — I-STAT CG4 LACTIC ACID, ED
Lactic Acid, Venous: 1.9 mmol/L (ref 0.5–1.9)
Lactic Acid, Venous: 1.8 mmol/L (ref 0.5–1.9)

## 2023-07-11 LAB — BRAIN NATRIURETIC PEPTIDE: B Natriuretic Peptide: 125.3 pg/mL — ABNORMAL HIGH (ref 0.0–100.0)

## 2023-07-11 LAB — CBC WITH DIFFERENTIAL/PLATELET
Abs Immature Granulocytes: 0.56 10*3/uL — ABNORMAL HIGH (ref 0.00–0.07)
Basophils Absolute: 0 10*3/uL (ref 0.0–0.1)
Basophils Relative: 0 %
Eosinophils Absolute: 0.1 10*3/uL (ref 0.0–0.5)
Eosinophils Relative: 1 %
HCT: 38.3 % — ABNORMAL LOW (ref 39.0–52.0)
Hemoglobin: 11.7 g/dL — ABNORMAL LOW (ref 13.0–17.0)
Immature Granulocytes: 3 %
Lymphocytes Relative: 4 %
Lymphs Abs: 0.8 10*3/uL (ref 0.7–4.0)
MCH: 30.2 pg (ref 26.0–34.0)
MCHC: 30.5 g/dL (ref 30.0–36.0)
MCV: 98.7 fL (ref 80.0–100.0)
Monocytes Absolute: 0.8 10*3/uL (ref 0.1–1.0)
Monocytes Relative: 4 %
Neutro Abs: 17 10*3/uL — ABNORMAL HIGH (ref 1.7–7.7)
Neutrophils Relative %: 88 %
Platelets: 313 10*3/uL (ref 150–400)
RBC: 3.88 MIL/uL — ABNORMAL LOW (ref 4.22–5.81)
RDW: 14 % (ref 11.5–15.5)
WBC: 19.3 10*3/uL — ABNORMAL HIGH (ref 4.0–10.5)
nRBC: 0 % (ref 0.0–0.2)

## 2023-07-11 LAB — COMPREHENSIVE METABOLIC PANEL
ALT: 27 U/L (ref 0–44)
AST: 24 U/L (ref 15–41)
Albumin: 2.2 g/dL — ABNORMAL LOW (ref 3.5–5.0)
Alkaline Phosphatase: 54 U/L (ref 38–126)
Anion gap: 11 (ref 5–15)
BUN: 42 mg/dL — ABNORMAL HIGH (ref 8–23)
CO2: 19 mmol/L — ABNORMAL LOW (ref 22–32)
Calcium: 8 mg/dL — ABNORMAL LOW (ref 8.9–10.3)
Chloride: 112 mmol/L — ABNORMAL HIGH (ref 98–111)
Creatinine, Ser: 1.85 mg/dL — ABNORMAL HIGH (ref 0.61–1.24)
GFR, Estimated: 35 mL/min — ABNORMAL LOW (ref >60)
Glucose, Bld: 90 mg/dL (ref 70–99)
Potassium: 4.7 mmol/L (ref 3.5–5.1)
Sodium: 142 mmol/L (ref 135–145)
Total Bilirubin: 0.6 mg/dL (ref 0.0–1.2)
Total Protein: 5.9 g/dL — ABNORMAL LOW (ref 6.5–8.1)

## 2023-07-11 LAB — RESP PANEL BY RT-PCR (RSV, FLU A&B, COVID)  RVPGX2
Influenza A by PCR: NEGATIVE
Influenza B by PCR: NEGATIVE
Resp Syncytial Virus by PCR: NEGATIVE
SARS Coronavirus 2 by RT PCR: NEGATIVE

## 2023-07-11 LAB — PROTIME-INR
INR: 3.7 — ABNORMAL HIGH (ref 0.8–1.2)
Prothrombin Time: 37 s — ABNORMAL HIGH (ref 11.4–15.2)

## 2023-07-11 LAB — APTT: aPTT: 42 s — ABNORMAL HIGH (ref 24–36)

## 2023-07-11 MED ORDER — OXYCODONE HCL ER 10 MG PO T12A
10.0000 mg | EXTENDED_RELEASE_TABLET | Freq: Two times a day (BID) | ORAL | Status: DC
  Administered 2023-07-11 – 2023-07-12 (×2): 10 mg via ORAL
  Filled 2023-07-11 (×2): qty 1

## 2023-07-11 MED ORDER — GABAPENTIN 300 MG PO CAPS
300.0000 mg | ORAL_CAPSULE | Freq: Two times a day (BID) | ORAL | Status: DC
  Administered 2023-07-11 – 2023-07-12 (×2): 300 mg via ORAL
  Filled 2023-07-11 (×2): qty 1

## 2023-07-11 MED ORDER — ONDANSETRON HCL 4 MG/2ML IJ SOLN: 4.0000 mg | Freq: Four times a day (QID) | INTRAMUSCULAR | Status: DC | PRN

## 2023-07-11 MED ORDER — LEVOTHYROXINE SODIUM 50 MCG PO TABS
50.0000 ug | ORAL_TABLET | Freq: Every day | ORAL | Status: DC
  Administered 2023-07-12: 50 ug via ORAL
  Filled 2023-07-11: qty 1

## 2023-07-11 MED ORDER — LACTULOSE 10 GM/15ML PO SOLN
20.0000 g | Freq: Every day | ORAL | Status: DC
  Administered 2023-07-11 – 2023-07-12 (×2): 20 g via ORAL
  Filled 2023-07-11 (×2): qty 30

## 2023-07-11 MED ORDER — FAMOTIDINE 20 MG PO TABS
20.0000 mg | ORAL_TABLET | Freq: Every day | ORAL | Status: DC
  Administered 2023-07-11 – 2023-07-12 (×2): 20 mg via ORAL
  Filled 2023-07-11 (×2): qty 1

## 2023-07-11 MED ORDER — AMIODARONE HCL 200 MG PO TABS
100.0000 mg | ORAL_TABLET | Freq: Every day | ORAL | Status: DC
  Administered 2023-07-11 – 2023-07-12 (×2): 100 mg via ORAL
  Filled 2023-07-11 (×2): qty 1

## 2023-07-11 MED ORDER — VANCOMYCIN HCL IN DEXTROSE 1-5 GM/200ML-% IV SOLN
1000.0000 mg | Freq: Once | INTRAVENOUS | Status: AC
  Administered 2023-07-11: 1000 mg via INTRAVENOUS
  Filled 2023-07-11 (×2): qty 200

## 2023-07-11 MED ORDER — DILTIAZEM HCL ER COATED BEADS 120 MG PO CP24
120.0000 mg | ORAL_CAPSULE | Freq: Every day | ORAL | Status: DC
  Administered 2023-07-11 – 2023-07-12 (×2): 120 mg via ORAL
  Filled 2023-07-11 (×2): qty 1

## 2023-07-11 MED ORDER — RIVAROXABAN 20 MG PO TABS
20.0000 mg | ORAL_TABLET | Freq: Every day | ORAL | Status: DC
  Administered 2023-07-11 – 2023-07-12 (×2): 20 mg via ORAL
  Filled 2023-07-11 (×2): qty 1

## 2023-07-11 MED ORDER — ATORVASTATIN CALCIUM 20 MG PO TABS
20.0000 mg | ORAL_TABLET | Freq: Every day | ORAL | Status: DC
  Administered 2023-07-11 – 2023-07-12 (×2): 20 mg via ORAL
  Filled 2023-07-11 (×2): qty 1

## 2023-07-11 MED ORDER — IPRATROPIUM-ALBUTEROL 0.5-2.5 (3) MG/3ML IN SOLN
3.0000 mL | Freq: Once | RESPIRATORY_TRACT | Status: AC
  Administered 2023-07-11: 3 mL via RESPIRATORY_TRACT
  Filled 2023-07-11: qty 3

## 2023-07-11 MED ORDER — SODIUM CHLORIDE 0.9 % IV SOLN
2.0000 g | Freq: Once | INTRAVENOUS | Status: AC
  Administered 2023-07-11: 2 g via INTRAVENOUS
  Filled 2023-07-11: qty 12.5

## 2023-07-11 MED ORDER — SODIUM CHLORIDE 0.9 % IV SOLN
500.0000 mg | INTRAVENOUS | Status: DC
  Administered 2023-07-11: 500 mg via INTRAVENOUS
  Filled 2023-07-11 (×4): qty 5

## 2023-07-11 MED ORDER — DULOXETINE HCL 60 MG PO CPEP
60.0000 mg | ORAL_CAPSULE | Freq: Two times a day (BID) | ORAL | Status: DC
  Administered 2023-07-11 – 2023-07-12 (×2): 60 mg via ORAL
  Filled 2023-07-11 (×2): qty 1

## 2023-07-11 MED ORDER — ACETAMINOPHEN 650 MG RE SUPP: 650.0000 mg | Freq: Four times a day (QID) | RECTAL | Status: DC | PRN

## 2023-07-11 MED ORDER — SODIUM CHLORIDE 0.9 % IV BOLUS
1000.0000 mL | Freq: Once | INTRAVENOUS | Status: AC
  Administered 2023-07-11: 1000 mL via INTRAVENOUS

## 2023-07-11 MED ORDER — DONEPEZIL HCL 5 MG PO TABS
5.0000 mg | ORAL_TABLET | Freq: Every day | ORAL | Status: DC
  Administered 2023-07-11: 5 mg via ORAL
  Filled 2023-07-11: qty 1

## 2023-07-11 MED ORDER — LACTATED RINGERS IV SOLN: INTRAVENOUS | Status: AC

## 2023-07-11 MED ORDER — SODIUM CHLORIDE 0.9 % IV SOLN
2.0000 g | INTRAVENOUS | Status: DC
  Administered 2023-07-11: 2 g via INTRAVENOUS
  Filled 2023-07-11: qty 20

## 2023-07-11 MED ORDER — ONDANSETRON HCL 4 MG PO TABS: 4.0000 mg | ORAL_TABLET | Freq: Four times a day (QID) | ORAL | Status: DC | PRN

## 2023-07-11 MED ORDER — ACETAMINOPHEN 325 MG PO TABS
650.0000 mg | ORAL_TABLET | Freq: Four times a day (QID) | ORAL | Status: DC | PRN
  Administered 2023-07-11: 650 mg via ORAL
  Filled 2023-07-11: qty 2

## 2023-07-11 NOTE — H&P (Signed)
 History and Physical    Patient: Carlos Hanson NGE:[id number] DOB: 02/03/1941 DOA: 07/11/2023 DOS: the patient was seen and examined on 07/11/2023 PCP: Garlan Fillers, MD  Patient coming from: Home  Chief Complaint:  Chief Complaint  Patient presents with   Fever   Altered Mental Status   HPI: Carlos Hanson is a 83 y.o. male with medical history significant of osteoarthritis, chronic back pain, CAD, atrial fibrillation, GERD, hiatal hernia, gout, impaired hearing, history of cystitis, history of bleeding PUD, hyperlipidemia, obstructive sleep apnea on BiPAP, class II obesity who was brought to the emergency department after developing fever followed by delirium at home. He does not have much recollection of what happens and history is taken by the patient's wife.  He has been having URI symptoms, cough, mild dyspnea and wheezing. No chest pain, palpitations, diaphoresis, PND, orthopnea or pitting edema of the lower extremities. No abdominal pain, nausea, emesis, diarrhea, constipation, melena or hematochezia. No flank pain, dysuria, frequency or hematuria.  No polyuria, polydipsia, polyphagia or blurred vision.   Lab work: CBC showed white count 19.3, hemoglobin 11.7 g/dL platelets 401.  PT was 37.0, INR 3.7 and PTT 42.  Coronavirus, influenza and RSV PCR test was negative.  Lactic acid was normal.  CMP showed a chloride of 112 and CO2 of 19 mmol/L with a normal anion gap.  The rest of the electrolytes were normal after calcium correction.  BUN is 42 and creatinine 1.85 mg/dL.  Total protein 5.9 and albumin 2.2 g/dL, the rest of the LFTs were normal.  Imaging: Portable 1 view chest radiograph showing enlargement of the cardiopericardial silhouette with pulmonary vascular congestion.  CT head without contrast with no acute intracranial normality or significant interval change.  Progressive atrophy and white matter disease.  CT chest without contrast showed multilobar pneumonia, most prominent in the  posterior lower lobe.  Recommend follow-up PA and lateral posttreatment chest radiograph in 4 to 6 weeks.  Dilated main pulmonary artery, suggesting pulm arterial hypertension, three-vessel CAD, tiny hiatal hernia.  Aortic atherosclerosis.  ED course: Initial vital signs were temperature 102.4 F, pulse 118, respiration 18, BP 119/60 mmHg O2 sat 91% on room air.  The patient received cefepime, vancomycin, DuoNeb and 2000 mL normal saline bolus.   Review of Systems: As mentioned in the history of present illness. All other systems reviewed and are negative. Past Medical History:  Diagnosis Date   Arthritis    Chronic back pain    Coronary artery disease    40% mLAD/DIAG bifurcation 03/25/10    Dysrhythmia    History of atrial fibrillation   Gout    H/O hiatal hernia    Hearing decreased    right ear   History of bladder infections    History of bleeding ulcers    History of ulcer disease    Hypercholesteremia    Nocturia    Obstructive sleep apnea hypopnea, severe    Sleep apnea    cpap sleep study 2011   Past Surgical History:  Procedure Laterality Date   BACK SURGERY     CARDIAC CATHETERIZATION     2011   cardioverson     COLONOSCOPY  08/27/2011   Procedure: COLONOSCOPY;  Surgeon: Malissa Hippo, MD;  Location: [facility];  Service: Endoscopy;  Laterality: N/A;  930   ESOPHAGOGASTRODUODENOSCOPY     JOINT REPLACEMENT     left shoulder   Left shoulder replacement     LUMBAR LAMINECTOMY  03/15/2012  Procedure: LUMBAR LAMINECTOMY WITH  X-STOP 2 LEVEL;  Surgeon: Barnett Abu, MD;  Location: [facility];  Service: Neurosurgery;  Laterality: N/A;  Lumbar two-three, three-four XSTOP   TONSILLECTOMY     Social History:  reports that he quit smoking about 18 years ago. His smoking use included cigarettes. He started smoking about 43 years ago. He has a 25 pack-year smoking history. He has quit using smokeless tobacco.  His smokeless tobacco use included chew. He reports current  alcohol use of about 2.0 standard drinks of alcohol per week. He reports that he does not use drugs.  Allergies  Allergen Reactions   Celecoxib     Other Reaction(s): ineffective   Penicillin G Sodium Nausea Only   Naprosyn [Naproxen] Rash    Family History  Problem Relation Age of Onset   Heart failure Mother    Diabetes Mother    Arthritis Mother    High blood pressure Mother    Heart failure Father    Colon cancer Neg Hx    Sleep apnea Neg Hx     Prior to Admission medications   Medication Sig Start Date End Date Taking? Authorizing Provider  acetaminophen (TYLENOL) 500 MG tablet Take 2 tablets (1,000 mg total) by mouth every 8 (eight) hours. 03/13/18   Rhetta Mura, MD  amiodarone (PACERONE) 200 MG tablet TAKE 1/2 TABLET BY MOUTH DAILY 10/08/22   Yates Decamp, MD  atorvastatin (LIPITOR) 20 MG tablet Take 1 tablet by mouth daily. 07/22/18   [provider]  diltiazem (DILACOR XR) 120 MG 24 hr capsule Take 120 mg by mouth daily.    [provider]  doxycycline (VIBRAMYCIN) 100 MG capsule Take 100 mg by mouth daily. 08/05/22   [provider]  DULoxetine (CYMBALTA) 30 MG capsule Take 3 capsules (90 mg total) by mouth daily. 03/13/18   Rhetta Mura, MD  famotidine (PEPCID) 20 MG tablet Take 20 mg by mouth daily. 04/04/21   [provider]  gabapentin (NEURONTIN) 300 MG capsule Take 300 mg by mouth every morning.    [provider]  lactulose (CHRONULAC) 10 GM/15ML solution Take 30 mLs (20 g total) by mouth daily. 03/13/18   Rhetta Mura, MD  levothyroxine (SYNTHROID) 50 MCG tablet Take 50 mcg by mouth daily before breakfast.    [provider]  MELATONIN PO Take by mouth at bedtime.    [provider]  morphine (MS CONTIN) 15 MG 12 hr tablet Take 1 tablet (15 mg total) by mouth every 12 (twelve) hours. 03/13/18   Rhetta Mura, MD  morphine (MS CONTIN) 15 MG 12 hr tablet Take 1 tablet (15 mg  total) by mouth every 12 (twelve) hours. 05/13/23     Multiple Vitamins-Minerals (VISION FORMULA EYE HEALTH) CAPS Take 1 tablet by mouth 2 (two) times daily. [employer]    [provider]  oxyCODONE (OXY IR/ROXICODONE) 5 MG immediate release tablet Take 1 tablet (5 mg total) by mouth 3 (three) times daily as needed for moderate pain or severe pain. 03/13/18   Rhetta Mura, MD  oxymetazoline (AFRIN) 0.05 % nasal spray Place 1 spray into both nostrils at bedtime as needed for congestion (sinuses).    [provider]  rivaroxaban (XARELTO) 20 MG TABS tablet Take 1 tablet (20 mg total) by mouth daily with supper. 02/09/23   Yates Decamp, MD    Physical Exam: Vitals:   07/11/23 0610 07/11/23 0730 07/11/23 0845 07/11/23 0859  BP:  (!) 106/56 126/89  Pulse: (!) 114 (!) 131 (!) 110   Resp: 17 (!) 21 (!) 22   Temp:    99.5 F (37.5 C)  TempSrc:    Oral  SpO2: 90% 95% 90%    Physical Exam Vitals and nursing note reviewed.  Constitutional:      General: He is awake. He is not in acute distress.    Appearance: He is ill-appearing.  HENT:     Head: Normocephalic.     Nose: No rhinorrhea.     Mouth/Throat:     Mouth: Mucous membranes are dry.  Eyes:     General: No scleral icterus.    Pupils: Pupils are equal, round, and reactive to light.  Neck:     Vascular: No JVD.  Cardiovascular:     Rate and Rhythm: Normal rate and regular rhythm.     Heart sounds: S1 normal and S2 normal.  Pulmonary:     Breath sounds: Wheezing and rales present.  Abdominal:     General: Bowel sounds are normal. There is no distension.     Palpations: Abdomen is soft.     Tenderness: There is no abdominal tenderness. There is no guarding.  Musculoskeletal:     Cervical back: Neck supple.     Right lower leg: No edema.     Left lower leg: No edema.  Skin:    General: Skin is warm and dry.  Neurological:     General: No focal deficit present.     Mental Status: He is alert and  oriented to person, place, and time.  Psychiatric:        Mood and Affect: Mood normal.        Behavior: Behavior normal. Behavior is cooperative.     Data Reviewed:  Results are pending, will review when available.  EKG: Vent. rate 113 BPM PR interval 84 ms QRS duration 142 ms QT/QTcB 360/494 ms P-R-T axes -38 -87 48 Sinus tachycardia Multiple premature complexes, vent & supraven RBBB and LAFB  Assessment and Plan: Principal Problem:   Sepsis due to undetermined organism (HCC) Associated with:   Acute respiratory insufficiency  In the setting of:   Multifocal pneumonia Admit to PCU/inpatient. Continue supplemental oxygen. Scheduled and as needed bronchodilators. Continue ceftriaxone 1 g IVPB daily. Continue azithromycin 500 mg IVPB daily. Check strep pneumoniae urinary antigen. Check sputum Gram stain, culture and sensitivity. Follow-up blood culture and sensitivity. Follow-up CBC and chemistry in the morning.  Active Problems:   OSA treated with BiPAP Continue BiPAP at bedtime.    Class 3 obesity with alveolar hypoventilation,  serious comorbidity, and body mass index (BMI) of 40.0 to 44.9 in adult (HCC) Current BMI 41.11 kg/m. Lifestyle modifications. Follow-up closely with PCP and/or bariatric clinic. On BiPAP ventilation as above.    RLS (restless legs syndrome) On duloxetine.    Paroxysmal atrial fibrillation (HCC) CHA?DS?-VASc Score of at least 4. Continue amiodarone, diltiazem and rivaroxaban.    Chronic pain syndrome Continue duloxetine 60 mg p.o. twice daily.    Essential hypertension Continue Cardizem and 120 mg p.o. daily.    Hyperlipidemia Continue atorvastatin 20 mg p.o. daily.    Hypothyroidism Continue levothyroxine 50 mcg p.o. daily.    Gastro-esophageal reflux disease without esophagitis Continue famotidine 20 mg p.o. daily.    Advance Care Planning:   Code Status: Full Code   Consults:   Family Communication: His wife was  at bedside.  Severity of Illness: The appropriate patient status for this patient is  INPATIENT. Inpatient status is judged to be reasonable and necessary in order to provide the required intensity of service to ensure the patient's safety. The patient's presenting symptoms, physical exam findings, and initial radiographic and laboratory data in the context of their chronic comorbidities is felt to place them at high risk for further clinical deterioration. Furthermore, it is not anticipated that the patient will be medically stable for discharge from the hospital within 2 midnights of admission.   * I certify that at the point of admission it is my clinical judgment that the patient will require inpatient hospital care spanning beyond 2 midnights from the point of admission due to high intensity of service, high risk for further deterioration and high frequency of surveillance required.*  Author: Bobette Mo, MD 07/11/2023 9:30 AM  For on call review [email or web address].   This document was prepared using Dragon voice recognition software and may contain some unintended transcription errors.

## 2023-07-11 NOTE — ED Triage Notes (Addendum)
 Pt arrived via EMS from home w/ c/o of altered mental status since 0200. Pt endorses chills and unproductive cough. On scene pt had a fever and treated w/ 1000 mg tylenol.  Pt alert and oriented x4 during triage.  EMS BP 128/70 O2 94

## 2023-07-11 NOTE — ED Provider Notes (Signed)
 [place] EMERGENCY DEPARTMENT AT [facility] Provider Note   CSN: [id number] Arrival date & time: 07/11/23  0549     History  Chief Complaint  Patient presents with   Fever   Altered Mental Status    Carlos Hanson is a 83 y.o. male who presents to the emergency department with a chief complaint of fever and altered mental status.  Patient is currently alert and oriented x 3.  Patient reports that last night he had shaking chills.  His wife took his temperature but he did not have 1 that was elevated as far as he knows.  He states the next thing he knows this morning she was calling EMS.  He says he has had a chronic cough that is not new.  He was unaware he had a fever.  He was given a gram of Tylenol prior to arrival.  Patient denies urinary symptoms or abdominal pain.  He states he has back pain but that is chronic and not new.  Notably hypoxic now on 3 L via nasal cannula   Fever Altered Mental Status Associated symptoms: fever        Home Medications Prior to Admission medications   Medication Sig Start Date End Date Taking? Authorizing Provider  acetaminophen (TYLENOL) 500 MG tablet Take 2 tablets (1,000 mg total) by mouth every 8 (eight) hours. 03/13/18   Rhetta Mura, MD  amiodarone (PACERONE) 200 MG tablet TAKE 1/2 TABLET BY MOUTH DAILY 10/08/22   Yates Decamp, MD  atorvastatin (LIPITOR) 20 MG tablet Take 1 tablet by mouth daily. 07/22/18   [provider]  diltiazem (DILACOR XR) 120 MG 24 hr capsule Take 120 mg by mouth daily.    [provider]  doxycycline (VIBRAMYCIN) 100 MG capsule Take 100 mg by mouth daily. 08/05/22   [provider]  DULoxetine (CYMBALTA) 30 MG capsule Take 3 capsules (90 mg total) by mouth daily. 03/13/18   Rhetta Mura, MD  famotidine (PEPCID) 20 MG tablet Take 20 mg by mouth daily. 04/04/21   [provider]  gabapentin (NEURONTIN) 300 MG capsule Take 300 mg by mouth every morning.     [provider]  lactulose (CHRONULAC) 10 GM/15ML solution Take 30 mLs (20 g total) by mouth daily. 03/13/18   Rhetta Mura, MD  levothyroxine (SYNTHROID) 50 MCG tablet Take 50 mcg by mouth daily before breakfast.    [provider]  MELATONIN PO Take by mouth at bedtime.    [provider]  morphine (MS CONTIN) 15 MG 12 hr tablet Take 1 tablet (15 mg total) by mouth every 12 (twelve) hours. 03/13/18   Rhetta Mura, MD  morphine (MS CONTIN) 15 MG 12 hr tablet Take 1 tablet (15 mg total) by mouth every 12 (twelve) hours. 05/13/23     Multiple Vitamins-Minerals (VISION [employer]MULA EYE HEALTH) CAPS Take 1 tablet by mouth 2 (two) times daily. [employer]    [provider]  oxyCODONE (OXY IR/ROXICODONE) 5 MG immediate release tablet Take 1 tablet (5 mg total) by mouth 3 (three) times daily as needed for moderate pain or severe pain. 03/13/18   Rhetta Mura, MD  oxymetazoline (AFRIN) 0.05 % nasal spray Place 1 spray into both nostrils at bedtime as needed for congestion (sinuses).    [provider]  rivaroxaban (XARELTO) 20 MG TABS tablet Take 1 tablet (20 mg total) by mouth daily with supper. 02/09/23   Yates Decamp, MD      Allergies  Celecoxib, Penicillin g sodium, and Naprosyn [naproxen]    Review of Systems   Review of Systems  Constitutional:  Positive for fever.    Physical Exam Updated Vital Signs BP 126/89   Pulse (!) 110   Temp 99.5 F (37.5 C) (Oral)   Resp (!) 22   SpO2 90%  Physical Exam Vitals and nursing note reviewed.  Constitutional:      Appearance: He is ill-appearing. He is not toxic-appearing.  HENT:     Head: Normocephalic and atraumatic.     Nose: Nose normal.     Mouth/Throat:     Mouth: Mucous membranes are moist.  Eyes:     Extraocular Movements: Extraocular movements intact.     Pupils: Pupils are equal, round, and reactive to light.  Cardiovascular:     Rate and Rhythm: Tachycardia  present.  Pulmonary:     Effort: Pulmonary effort is normal.     Breath sounds: Rhonchi present.  Abdominal:     General: Abdomen is protuberant.     Tenderness: There is no abdominal tenderness.     Hernia: No hernia is present.  Musculoskeletal:     Cervical back: Normal range of motion and neck supple.  Skin:    Comments: Feet are cold and mottled however there are palpable pulses, thready 1+ DPs bilaterally, fingers and hands are warm and well-perfused.  Suspect chronic vascular insufficiency .  Neurological:     Mental Status: He is alert.     ED Results / Procedures / Treatments   Labs (all labs ordered are listed, but only abnormal results are displayed) Labs Reviewed  CBC WITH DIFFERENTIAL/PLATELET - Abnormal; Notable for the following components:      Result Value   WBC 19.3 (*)    RBC 3.88 (*)    Hemoglobin 11.7 (*)    HCT 38.3 (*)    Neutro Abs 17.0 (*)    Abs Immature Granulocytes 0.56 (*)    All other components within normal limits  APTT - Abnormal; Notable for the following components:   aPTT 42 (*)    All other components within normal limits  BRAIN NATRIURETIC PEPTIDE - Abnormal; Notable for the following components:   B Natriuretic Peptide 125.3 (*)    All other components within normal limits  PROTIME-INR - Abnormal; Notable for the following components:   Prothrombin Time 37.0 (*)    INR 3.7 (*)    All other components within normal limits  I-STAT CHEM 8, ED - Abnormal; Notable for the following components:   Potassium 5.2 (*)    BUN 47 (*)    Creatinine, Ser 1.90 (*)    Calcium, Ion 1.08 (*)    Hemoglobin 12.2 (*)    HCT 36.0 (*)    All other components within normal limits  CULTURE, BLOOD (ROUTINE X 2)  CULTURE, BLOOD (ROUTINE X 2)  RESP PANEL BY RT-PCR (RSV, FLU A&B, COVID)  RVPGX2  URINALYSIS, W/ REFLEX TO CULTURE (INFECTION SUSPECTED)  COMPREHENSIVE METABOLIC PANEL  I-STAT CG4 LACTIC ACID, ED  I-STAT CG4 LACTIC ACID, ED    EKG EKG  Interpretation Date/Time:  Sunday July 11 2023 07:55:50 EDT Ventricular Rate:  113 PR Interval:  84 QRS Duration:  142 QT Interval:  360 QTC Calculation: 494 R Axis:   -87  Text Interpretation: Sinus tachycardia Multiple premature complexes, vent & supraven RBBB and LAFB Confirmed by Kristine Royal [phone]) on 07/11/2023 7:57:29 AM  Radiology DG Chest Port 1 View Result Date:  07/11/2023 CLINICAL DATA:  Altered mental status. EXAM: PORTABLE CHEST 1 VIEW COMPARISON:  03/09/2018 FINDINGS: The cardio pericardial silhouette is enlarged. There is pulmonary vascular congestion without overt pulmonary edema. No focal consolidation or substantial pleural effusion. No acute bony abnormality. Degenerative changes noted right shoulder. Left shoulder replacement evident. Telemetry leads overlie the chest. IMPRESSION: Enlargement of the cardiopericardial silhouette with pulmonary vascular congestion. Electronically Signed   By: Kennith Center M.D.   On: 07/11/2023 07:02    Procedures .Critical Care  Performed by: Arthor Captain, PA-C Authorized by: Arthor Captain, PA-C   Critical care provider statement:    Critical care time (minutes):  60   Critical care time was exclusive of:  Separately billable procedures and treating other patients   Critical care was necessary to treat or prevent imminent or life-threatening deterioration of the following conditions:  Respiratory failure   Critical care was time spent personally by me on the following activities:  Development of treatment plan with patient or surrogate, discussions with consultants, evaluation of patient's response to treatment, examination of patient, ordering and review of laboratory studies, ordering and review of radiographic studies, ordering and performing treatments and interventions, pulse oximetry, re-evaluation of patient's condition, review of old charts, interpretation of cardiac output measurements and obtaining history from patient or  surrogate     Medications Ordered in ED Medications  lactated ringers infusion ( Intravenous New Bag/Given 07/11/23 0740)  vancomycin (VANCOCIN) IVPB 1000 mg/200 mL premix (has no administration in time range)  ceFEPIme (MAXIPIME) 2 g in sodium chloride 0.9 % 100 mL IVPB (2 g Intravenous New Bag/Given 07/11/23 0742)  ipratropium-albuterol (DUONEB) 0.5-2.5 (3) MG/3ML nebulizer solution 3 mL (3 mLs Nebulization Given 07/11/23 0742)  sodium chloride 0.9 % bolus 1,000 mL (1,000 mLs Intravenous New Bag/Given 07/11/23 0849)    ED Course/ Medical Decision Making/ A&P Clinical Course as of 07/11/23 0906  Sun Jul 11, 2023  0742 WBC(!): 19.3 [AH]  0742 Hemoglobin(!): 11.7 [AH]  0742 Lactic Acid, Venous: 1.9 [AH]  0834 Pulse Rate(!): 131 Patient's heart rate elevated.  Apparently has blood pressures are softening.  Have ordered fluids [AH]  0851 Potassium(!): 5.2 Patient may have slightly elevated potassium will check with CMP in the main lab [AH]  0851 Creatinine(!): 1.90 Getting appears to be slightly above baseline. [AH]  0851 I was told by his paramedic there that his blood pressure was in the 70s.  I went into to the patient who is lying flat.  Patient also has clearly broken his fever because the bed was soaked with sweat.  Oral temperature now 99.5.  We changed sheets, reposition the patient to a sitting position and his blood pressure came up to 126/89. [AH]    Clinical Course User Index [AH] Arthor Captain, PA-C                                 Medical Decision Making Amount and/or Complexity of Data Reviewed Labs: ordered. Decision-making details documented in ED Course. Radiology: ordered.  Risk Prescription drug management. Decision regarding hospitalization.   This patient presents to the ED with chief complaint(s) of altered mental status, cough, hypoxia and fever with pertinent past medical history of obesity, chronic back pain, OSA on BiPAP, paroxysmal atrial fibrillation on  anticoagulation with Xarelto which further complicates the presenting complaint. The complaint involves an extensive differential diagnosis and treatment options and also carries with it a high risk  of complications and morbidity.    Differential diagnosis for emergent cause of cough includes but is not limited to upper respiratory infection, lower respiratory infection, allergies, asthma, irritants, foreign body, medications such as ACE inhibitors, reflux, asthma, CHF, lung cancer, interstitial lung disease, psychiatric causes, postnasal drip and postinfectious bronchospasm.  Highest suspicion at this time is for community-acquired pneumonia or influenza.  Differential also includes intra-abdominal infection less likely due to no abdominal tenderness, urinary tract infection.  No obvious rashes or wounds as a source for infection.   The initial plan is to order sepsis workup and to treat patient with DuoNeb broad-spectrum antibiotics for fever, hypoxia   Additionally I have considered pulmonary edema given history of OSA, A-fib and currently with sinus tachycardia due to fever BNP pending  Additional history obtained: Additional history obtained from  nursing triage  Wife at bedside who states that he was a little combative and confused this morning and had a fever of 100.4 at home. She also reports he had a cold with lingering cough that began 3 weeks ago Records reviewed outside cardiology notes  Reassessment and review (also see workup area): Lab Tests: I Ordered, and personally interpreted labs.  The pertinent results include:    Imaging Studies: I ordered and independently visualized and interpreted the following imaging X-ray l chest   which showed bilateral edema versus infiltrates The interpretation of the imaging was limited to assessing for emergent pathology, for which purpose it was ordered.  Consultations Obtained: I requested consultation with the admitting physician Dr. Robb Matar ,  and discussed  findings as well as pertinent plan - they recommend: Admission  Medicines ordered and prescription drug management: I ordered the following medications broad-spectrum antibiotics, DuoNeb for cough, fever, elevated white blood cell count and suspected pneumonia I considered this additional medications: Lasix for diuresis however I do not think the patient is volume overloaded   Reevaluation of the patient after these medicines showed that the patient    improved   Cardiac Monitoring: The patient was maintained on a cardiac monitor.  I personally viewed and interpreted the cardiac monitor which showed an underlying rhythm of:  sinus tachycardia  Complexity of problems addressed: Patient's presentation is most consistent with  acute presentation with potential threat to life or bodily function During patient's assessment  Disposition: After consideration of the diagnostic results and the patient's response to treatment,  I feel that the patent would benefit from admission for suspected pneumonia.  Creatinine is up from his baseline.  He does not appear volume overloaded.  Chest x-ray is read as potential pulmonary vascular congestion however I think that he has pneumonia.  Patient is hypoxic requiring 3 L via nasal cannula. .          Final Clinical Impression(s) / ED Diagnoses Final diagnoses:  Acute respiratory failure with hypoxia (HCC)  Fever, unspecified fever cause  Delirium    Rx / DC Orders ED Discharge Orders     None         Arthor Captain, PA-C 07/11/23 1530    Wynetta Fines, MD 07/12/23 [phone]

## 2023-07-11 NOTE — Plan of Care (Signed)
  Problem: Education: Goal: Knowledge of General Education information will improve Description: Including pain rating scale, medication(s)/side effects and non-pharmacologic comfort measures Outcome: Progressing   Problem: Health Behavior/Discharge Planning: Goal: Ability to manage health-related needs will improve Outcome: Progressing   Problem: Activity: Goal: Risk for activity intolerance will decrease Outcome: Progressing   Problem: Nutrition: Goal: Adequate nutrition will be maintained Outcome: Progressing   Problem: Elimination: Goal: Will not experience complications related to bowel motility Outcome: Progressing Goal: Will not experience complications related to urinary retention Outcome: Progressing

## 2023-07-11 NOTE — ED Notes (Signed)
 Verbal order to continue NaCl 1,000cc at a rate of 150cc per hour

## 2023-07-12 ENCOUNTER — Inpatient Hospital Stay (HOSPITAL_COMMUNITY)

## 2023-07-12 DIAGNOSIS — M79662 Pain in left lower leg: Secondary | ICD-10-CM

## 2023-07-12 DIAGNOSIS — J9601 Acute respiratory failure with hypoxia: Principal | ICD-10-CM | POA: Diagnosis present

## 2023-07-12 DIAGNOSIS — A419 Sepsis, unspecified organism: Secondary | ICD-10-CM | POA: Diagnosis not present

## 2023-07-12 LAB — BLOOD CULTURE ID PANEL (REFLEXED) - BCID2

## 2023-07-12 LAB — COMPREHENSIVE METABOLIC PANEL
ALT: 27 U/L (ref 0–44)
AST: 25 U/L (ref 15–41)
Albumin: 2 g/dL — ABNORMAL LOW (ref 3.5–5.0)
Alkaline Phosphatase: 61 U/L (ref 38–126)
Anion gap: 8 (ref 5–15)
BUN: 39 mg/dL — ABNORMAL HIGH (ref 8–23)
CO2: 23 mmol/L (ref 22–32)
Calcium: 7.9 mg/dL — ABNORMAL LOW (ref 8.9–10.3)
Chloride: 107 mmol/L (ref 98–111)
Creatinine, Ser: 1.63 mg/dL — ABNORMAL HIGH (ref 0.61–1.24)
GFR, Estimated: 41 mL/min — ABNORMAL LOW (ref >60)
Glucose, Bld: 62 mg/dL — ABNORMAL LOW (ref 70–99)
Potassium: 4.9 mmol/L (ref 3.5–5.1)
Sodium: 138 mmol/L (ref 135–145)
Total Bilirubin: 0.3 mg/dL (ref 0.0–1.2)
Total Protein: 6.2 g/dL — ABNORMAL LOW (ref 6.5–8.1)

## 2023-07-12 LAB — CBC
HCT: 34.2 % — ABNORMAL LOW (ref 39.0–52.0)
Hemoglobin: 10.8 g/dL — ABNORMAL LOW (ref 13.0–17.0)
MCH: 31 pg (ref 26.0–34.0)
MCHC: 31.6 g/dL (ref 30.0–36.0)
MCV: 98.3 fL (ref 80.0–100.0)
Platelets: 286 10*3/uL (ref 150–400)
RBC: 3.48 MIL/uL — ABNORMAL LOW (ref 4.22–5.81)
RDW: 14.5 % (ref 11.5–15.5)
WBC: 25.3 10*3/uL — ABNORMAL HIGH (ref 4.0–10.5)
nRBC: 0 % (ref 0.0–0.2)

## 2023-07-12 MED ORDER — CEFAZOLIN SODIUM-DEXTROSE 2-4 GM/100ML-% IV SOLN
2.0000 g | Freq: Three times a day (TID) | INTRAVENOUS | Status: DC
  Administered 2023-07-12 (×2): 2 g via INTRAVENOUS
  Filled 2023-07-12 (×3): qty 100

## 2023-07-12 MED ORDER — AZITHROMYCIN 250 MG PO TABS: ORAL_TABLET | ORAL | 0 refills | Status: DC

## 2023-07-12 NOTE — Care Management Obs Status (Signed)
 MEDICARE OBSERVATION STATUS NOTIFICATION   Patient Details  Name: Carlos Hanson MRN: [id number] Date of Birth: 01-13-1941   Medicare Observation Status Notification Given:  Yes    MahabirOlegario Messier, RN 07/12/2023, 3:59 PM

## 2023-07-12 NOTE — Progress Notes (Signed)
 Mobility Specialist - Progress Note   07/12/23 1427  Oxygen Therapy  SpO2 91 %  O2 Device Room Air  Patient Activity (if Appropriate) Ambulating  Mobility  Activity Ambulated with assistance in hallway  Level of Assistance Modified independent, requires aide device or extra time  Assistive Device [employer] Ambulated (ft) 250 ft  Activity Response Tolerated well  Mobility Referral Yes  Mobility visit 1 Mobility  Mobility Specialist Start Time (ACUTE ONLY) 1417  Mobility Specialist Stop Time (ACUTE ONLY) 1426  Mobility Specialist Time Calculation (min) (ACUTE ONLY) 9 min   Nurse requested Mobility Specialist to perform oxygen saturation test with pt which includes removing pt from oxygen both at rest and while ambulating.  Below are the results from that testing.     Patient Saturations on Room Air at Rest = spO2 91%  Patient Saturations on Room Air while Ambulating = sp02 91% . Reported results to nurse.    Pt received in bed and agreeable to mobility. No complaints during session. Pt to recliner after session with all needs met.    Pre-mobility: 92% SpO2 During mobility: 91% SpO2 Post-mobility: 90% SPO2  Chief Technology Officer

## 2023-07-12 NOTE — Discharge Summary (Signed)
 Physician Discharge Summary  Carlos Hanson VZD:[id number] DOB: 11/17/1940 DOA: 07/11/2023  PCP: Garlan Fillers, MD  Admit date: 07/11/2023 Discharge date: 07/12/2023  Admitted From: Home Disposition: Home  Recommendations for Outpatient Follow-up:  Follow up with PCP in 1-2 weeks Follow-up with cardiology as scheduled  Home Health: None Equipment/Devices: None  Discharge Condition: Stable CODE STATUS: Full Diet recommendation: Low-salt low-fat low-carb diet  Brief/Interim Summary: Carlos Hanson is a 83 y.o. male with medical history significant of osteoarthritis, chronic back pain, CAD, atrial fibrillation, GERD, hiatal hernia, gout, history of bleeding PUD, hyperlipidemia, obstructive sleep apnea on BiPAP, class II obesity who was brought to the emergency department after developing fever followed by delirium at home.   Patient admitted as above with acute delirium concern for multifocal pneumonia meeting sepsis criteria at intake.  Patient improved drastically over the past 24 hours, he is no longer hypoxic, pneumonia appears to be resolving.  Unclear etiology for mental status changes but possibly in the setting of transient hypoxia.  Regardless patient is now back to baseline ambulating without any difficulty, hypoxia or dyspnea.  He does have notable obesity hypoventilation and sleep apnea likely complicating his respiratory status as above but again back to baseline at this time.  Would resume home therapy at discharge with no changes.  Of note patient did have unilateral, left lower leg, swelling and calf tenderness, DVT study performed without pole filling defect.  Recommend close follow-up with PCP and cardiology in the next 1 to 2 weeks for further evaluation and treatment.  Telemetry notable for questionable trigeminy however appears to be consistent with prior EKG at cardiology office last April, again he remains asymptomatic.   Discharge Diagnoses:  Principal Problem:   Sepsis  due to undetermined organism [facility] Active Problems:   RLS (restless legs syndrome)   Paroxysmal atrial fibrillation (HCC)   Class 3 obesity with alveolar hypoventilation, serious comorbidity, and body mass index (BMI) of 40.0 to 44.9 in adult (HCC)   OSA treated with BiPAP   Chronic pain syndrome   Essential hypertension   Hyperlipidemia   Hypothyroidism   Gastro-esophageal reflux disease without esophagitis   Multifocal pneumonia   Acute respiratory insufficiency   Acute respiratory failure with hypoxia (HCC)    Discharge Instructions   Allergies as of 07/12/2023       Reactions   Celecoxib Other (See Comments)   Ineffective   Penicillin G Sodium Nausea Only   Naprosyn [naproxen] Rash        Medication List     STOP taking these medications    morphine 15 MG 12 hr tablet Commonly known as: MS Contin       TAKE these medications    acetaminophen 500 MG tablet Commonly known as: TYLENOL Take 2 tablets (1,000 mg total) by mouth every 8 (eight) hours. What changed:  how much to take when to take this reasons to take this   amiodarone 200 MG tablet Commonly known as: PACERONE TAKE 1/2 TABLET BY MOUTH DAILY   atorvastatin 20 MG tablet Commonly known as: LIPITOR Take 1 tablet by mouth daily.   clobetasol 0.05 % external solution Commonly known as: TEMOVATE Apply 1 Application topically daily as needed (psoriasis).   diltiazem 120 MG 24 hr capsule Commonly known as: CARDIZEM CD Take 120 mg by mouth daily.   donepezil 5 MG tablet Commonly known as: ARICEPT Take 5 mg by mouth at bedtime.   DULoxetine 60 MG capsule Commonly known as: CYMBALTA  Take 60 mg by mouth in the morning, at noon, and at bedtime.   famotidine 20 MG tablet Commonly known as: PEPCID Take 20 mg by mouth daily.   gabapentin 300 MG capsule Commonly known as: NEURONTIN Take 300 mg by mouth 2 (two) times daily.   ketoconazole 2 % cream Commonly known as: NIZORAL Apply  topically 2 (two) times daily.   lactulose 10 GM/15ML solution Commonly known as: CHRONULAC Take 30 mLs (20 g total) by mouth daily.   levothyroxine 50 MCG tablet Commonly known as: SYNTHROID Take 50 mcg by mouth daily before breakfast.   MELATONIN PO Take 10 mg by mouth at bedtime.   OVER THE COUNTER MEDICATION Take 1 tablet by mouth daily. Pain away   OxyCONTIN 10 mg 12 hr tablet Generic drug: oxyCODONE Take 10 mg by mouth every 12 (twelve) hours.   oxymetazoline 0.05 % nasal spray Commonly known as: AFRIN Place 1 spray into both nostrils at bedtime as needed for congestion (sinuses).   rivaroxaban 20 MG Tabs tablet Commonly known as: Xarelto Take 1 tablet (20 mg total) by mouth daily with supper.   Senexon-S 8.6-50 MG tablet Generic drug: senna-docusate Take 1 tablet by mouth at bedtime.   Vision Formula Eye Health Caps Take 1 tablet by mouth 2 (two) times daily. Vision Health        Allergies  Allergen Reactions   Celecoxib Other (See Comments)    Ineffective   Penicillin G Sodium Nausea Only   Naprosyn [Naproxen] Rash    Consultations: None  Procedures/Studies: VAS [place] LOWER EXTREMITY VENOUS (DVT) Result Date: 07/12/2023  Lower Venous DVT Study Patient Name:  Carlos Hanson  Date of Exam:   07/12/2023 Medical Rec #: [id number]     Accession #:    [id number] Date of Birth: Nov 26, 1940      Patient Gender: M Patient Age:   83 years Exam Location:  [facility] Procedure:      VAS [place] LOWER EXTREMITY VENOUS (DVT) Referring Phys: Carma Leaven --------------------------------------------------------------------------------  Indications: Pain, and CAD, gout.  Comparison Study: No prior exam. Performing Technologist: Fernande Bras  Examination Guidelines: A complete evaluation includes B-mode imaging, spectral Doppler, color Doppler, and power Doppler as needed of all accessible portions of each vessel. Bilateral testing is considered an integral part of a  complete examination. Limited examinations for reoccurring indications may be performed as noted. The reflux portion of the exam is performed with the patient in reverse Trendelenburg.  +-----+---------------+---------+-----------+----------+--------------+ RIGHTCompressibilityPhasicitySpontaneityPropertiesThrombus Aging +-----+---------------+---------+-----------+----------+--------------+ CFV  Full           Yes      Yes                                 +-----+---------------+---------+-----------+----------+--------------+ SFJ  Full           Yes      Yes                                 +-----+---------------+---------+-----------+----------+--------------+   +---------+---------------+---------+-----------+----------+--------------+ LEFT     CompressibilityPhasicitySpontaneityPropertiesThrombus Aging +---------+---------------+---------+-----------+----------+--------------+ CFV      Full           Yes      Yes                                 +---------+---------------+---------+-----------+----------+--------------+  SFJ      Full           Yes      Yes                                 +---------+---------------+---------+-----------+----------+--------------+ FV Prox  Full                                                        +---------+---------------+---------+-----------+----------+--------------+ FV Mid   Full                                                        +---------+---------------+---------+-----------+----------+--------------+ FV DistalFull                                                        +---------+---------------+---------+-----------+----------+--------------+ PFV      Full                                                        +---------+---------------+---------+-----------+----------+--------------+ POP      Full           Yes      Yes                                  +---------+---------------+---------+-----------+----------+--------------+ PTV      Full                                                        +---------+---------------+---------+-----------+----------+--------------+ PERO     Full                                                        +---------+---------------+---------+-----------+----------+--------------+     Summary: RIGHT: - No evidence of common femoral vein obstruction.   LEFT: - There is no evidence of deep vein thrombosis in the lower extremity.  - No cystic structure found in the popliteal fossa.  *See table(s) above for measurements and observations. Electronically signed by Sherald Hess MD on 07/12/2023 at 2:19:44 PM.    Final    CT CHEST WO CONTRAST Result Date: 07/11/2023 CLINICAL DATA:  Pneumonia, complication suspected, xray done Cough, chronic/persisting > 8 weeks, failed empiric treatment EXAM: CT CHEST WITHOUT CONTRAST TECHNIQUE: Multidetector CT imaging of the chest was performed following the standard protocol without IV contrast. RADIATION DOSE REDUCTION: This exam was performed according  to the departmental dose-optimization program which includes automated exposure control, adjustment of the mA and/or kV according to patient size and/or use of iterative reconstruction technique. COMPARISON:  Chest radiograph from earlier today. FINDINGS: Cardiovascular: Normal heart size. No significant pericardial effusion/thickening. Three-vessel coronary atherosclerosis. Atherosclerotic nonaneurysmal thoracic aorta. Dilated main pulmonary artery (3.7 cm diameter). Mediastinum/Nodes: No significant thyroid nodules. Unremarkable esophagus. No pathologically enlarged axillary, mediastinal or hilar lymph nodes, noting limited sensitivity for the detection of hilar adenopathy on this noncontrast study. Lungs/Pleura: No pneumothorax. No pleural effusion. Diffuse bronchial wall thickening. Moderate patchy foci of peribronchovascular  consolidation and ground-glass opacity in the posterior lower lobes bilaterally with less prominent similar involvement of the right middle lobe and posterior lingula. No discrete lung masses or significant pulmonary nodules. No bronchiectasis. Upper abdomen: Tiny hiatal hernia. Musculoskeletal: No aggressive appearing focal osseous lesions. Marked thoracic spondylosis. Left shoulder hemiarthroplasty. IMPRESSION: 1. Multilobar pneumonia, most prominent in the posterior lower lobes. Recommend follow-up PA and lateral post treatment chest radiographs in 4-6 weeks. 2. Dilated main pulmonary artery, suggesting pulmonary arterial hypertension. 3. Three-vessel coronary atherosclerosis. 4. Tiny hiatal hernia. 5.  Aortic Atherosclerosis (ICD10-I70.0). Electronically Signed   By: Delbert Phenix M.D.   On: 07/11/2023 10:28   CT Head Wo Contrast Result Date: 07/11/2023 CLINICAL DATA:  Altered mental status since 2 a.m.  Fever. EXAM: CT HEAD WITHOUT CONTRAST TECHNIQUE: Contiguous axial images were obtained from the base of the skull through the vertex without intravenous contrast. RADIATION DOSE REDUCTION: This exam was performed according to the departmental dose-optimization program which includes automated exposure control, adjustment of the mA and/or kV according to patient size and/or use of iterative reconstruction technique. COMPARISON:  CT head without contrast 03/09/2018. FINDINGS: Brain: Progressive atrophy and white matter changes are present bilaterally. No acute infarct, hemorrhage, or mass lesion is present. The ventricles are proportionate to the degree of atrophy. Deep brain nuclei are within normal limits. The brainstem and cerebellum are within normal limits. Midline structures are within normal limits. Vascular: Atherosclerotic calcifications are present within the cavernous internal carotid arteries bilaterally. No hyperdense vessels are present. Skull: Calvarium is intact. No focal lytic or blastic lesions  are present. No significant extracranial soft tissue lesion is present. Sinuses/Orbits: The paranasal sinuses and mastoid air cells are clear. Bilateral lens replacements are noted. Globes and orbits are otherwise unremarkable. IMPRESSION: 1. No acute intracranial abnormality or significant interval change. 2. Progressive atrophy and white matter disease. This likely reflects the sequela of chronic microvascular ischemia. Electronically Signed   By: Marin Roberts M.D.   On: 07/11/2023 10:28   DG Chest Port 1 View Result Date: 07/11/2023 CLINICAL DATA:  Altered mental status. EXAM: PORTABLE CHEST 1 VIEW COMPARISON:  03/09/2018 FINDINGS: The cardio pericardial silhouette is enlarged. There is pulmonary vascular congestion without overt pulmonary edema. No focal consolidation or substantial pleural effusion. No acute bony abnormality. Degenerative changes noted right shoulder. Left shoulder replacement evident. Telemetry leads overlie the chest. IMPRESSION: Enlargement of the cardiopericardial silhouette with pulmonary vascular congestion. Electronically Signed   By: Kennith Center M.D.   On: 07/11/2023 07:02     Subjective: No acute issues or events overnight denies nausea vomiting diarrhea constipation headache fevers chills chest pain shortness of breath.   Discharge Exam: Vitals:   07/12/23 1248 07/12/23 1427  BP: (!) 127/51   Pulse: 88   Resp: 20   Temp: 98.1 F (36.7 C)   SpO2: 96% 91%   Vitals:   07/12/23 0027 07/12/23  1610 07/12/23 1248 07/12/23 1427  BP:  117/72 (!) 127/51   Pulse:  83 88   Resp: 16 19 20    Temp:  97.9 F (36.6 C) 98.1 F (36.7 C)   TempSrc:  Oral Oral   SpO2:  96% 96% 91%  Weight:      Height:        General:  Pleasantly resting in bed, No acute distress. HEENT:  Normocephalic atraumatic.  Sclerae nonicteric, noninjected.  Extraocular movements intact bilaterally. Neck:  Without mass or deformity.  Trachea is midline. Lungs:  Clear to auscultate  bilaterally without rhonchi, wheeze, or rales. Heart:  Regular rate and rhythm.  Without murmurs, rubs, or gallops. Abdomen:  Soft, nontender, nondistended.  Without guarding or rebound. Extremities: Without cyanosis, clubbing, edema, or obvious deformity. Skin:  Warm and dry, no erythema.   The results of significant diagnostics from this hospitalization (including imaging, microbiology, ancillary and laboratory) are listed below for reference.     Microbiology: Recent Results (from the past 240 hours)  Culture, blood (Routine x 2)     Status: Abnormal (Preliminary result)   Collection Time: 07/11/23  6:50 AM   Specimen: BLOOD  Result Value Ref Range Status   Specimen Description   Final    BLOOD BLOOD LEFT FOREARM Performed at [facility], [address] [place], [name] [address]    Special Requests   Final    BOTTLES DRAWN AEROBIC AND ANAEROBIC Blood Culture adequate volume Performed at [facility], [address], [place], [name] [address]    Culture  Setup Time   Final    GRAM POSITIVE COCCI IN PAIRS IN CHAINS ANAEROBIC BOTTLE ONLY CRITICAL VALUE NOTED.  VALUE IS CONSISTENT WITH PREVIOUSLY REPORTED AND CALLED VALUE. Performed at [facility], [address], [place], [name] [address]    Culture GROUP B STREP(S.AGALACTIAE)ISOLATED (A)  Final   Report Status PENDING  Incomplete  Culture, blood (Routine x 2)     Status: Abnormal (Preliminary result)   Collection Time: 07/11/23  6:53 AM   Specimen: BLOOD  Result Value Ref Range Status   Specimen Description   Final    BLOOD LEFT ANTECUBITAL Performed at [facility], [address] [place], [name] [address]    Special Requests   Final    BOTTLES DRAWN AEROBIC AND ANAEROBIC Blood Culture results may not be optimal due to an inadequate volume of blood received in culture bottles Performed at [facility] [address]  [place], [name] [address]    Culture  Setup Time   Final    GRAM POSITIVE COCCI IN PAIRS IN CHAINS IN BOTH AEROBIC AND ANAEROBIC BOTTLES CRITICAL RESULT CALLED TO, READ BACK BY AND VERIFIED WITH: PHARMD E JACKSON 07/12/2023 @ 0234 BY AB    Culture (A)  Final    GROUP B STREP(S.AGALACTIAE)ISOLATED CULTURE REINCUBATED FOR BETTER GROWTH Performed at [facility], [address] [place], [name] [address]    Report Status PENDING  Incomplete  Blood Culture ID Panel (Reflexed)     Status: Abnormal   Collection Time: 07/11/23  6:53 AM  Result Value Ref Range Status   Enterococcus faecalis NOT DETECTED NOT DETECTED Final   Enterococcus Faecium NOT DETECTED NOT DETECTED Final   Listeria monocytogenes NOT DETECTED NOT DETECTED Final   Staphylococcus species NOT DETECTED NOT DETECTED Final   Staphylococcus aureus (BCID) NOT DETECTED NOT DETECTED Final   Staphylococcus epidermidis  NOT DETECTED NOT DETECTED Final   Staphylococcus lugdunensis NOT DETECTED NOT DETECTED Final   Streptococcus species DETECTED (A) NOT DETECTED Final    Comment: CRITICAL RESULT CALLED TO, READ BACK BY AND VERIFIED WITH: PHARMD E JACKSON 07/12/2023 @ 0234 BY AB    Streptococcus agalactiae DETECTED (A) NOT DETECTED Final    Comment: CRITICAL RESULT CALLED TO, READ BACK BY AND VERIFIED WITH: PHARMD E JACKSON 07/12/2023 @ 0234 BY AB    Streptococcus pneumoniae NOT DETECTED NOT DETECTED Final   Streptococcus pyogenes NOT DETECTED NOT DETECTED Final   A.calcoaceticus-baumannii NOT DETECTED NOT DETECTED Final   Bacteroides fragilis NOT DETECTED NOT DETECTED Final   Enterobacterales NOT DETECTED NOT DETECTED Final   Enterobacter cloacae complex NOT DETECTED NOT DETECTED Final   Escherichia coli NOT DETECTED NOT DETECTED Final   Klebsiella aerogenes NOT DETECTED NOT DETECTED Final   Klebsiella oxytoca NOT DETECTED NOT DETECTED Final   Klebsiella pneumoniae NOT DETECTED NOT DETECTED Final   Proteus species NOT DETECTED  NOT DETECTED Final   Salmonella species NOT DETECTED NOT DETECTED Final   Serratia marcescens NOT DETECTED NOT DETECTED Final   Haemophilus influenzae NOT DETECTED NOT DETECTED Final   Neisseria meningitidis NOT DETECTED NOT DETECTED Final   Pseudomonas aeruginosa NOT DETECTED NOT DETECTED Final   Stenotrophomonas maltophilia NOT DETECTED NOT DETECTED Final   Candida albicans NOT DETECTED NOT DETECTED Final   Candida auris NOT DETECTED NOT DETECTED Final   Candida glabrata NOT DETECTED NOT DETECTED Final   Candida krusei NOT DETECTED NOT DETECTED Final   Candida parapsilosis NOT DETECTED NOT DETECTED Final   Candida tropicalis NOT DETECTED NOT DETECTED Final   Cryptococcus neoformans/gattii NOT DETECTED NOT DETECTED Final    Comment: Performed at [facility], [address], [place], [name] [address]  Resp panel by RT-PCR (RSV, Flu A&B, Covid) Anterior Nasal Swab     Status: None   Collection Time: 07/11/23  8:30 AM   Specimen: Anterior Nasal Swab  Result Value Ref Range Status   SARS Coronavirus 2 by RT PCR NEGATIVE NEGATIVE Final    Comment: (NOTE) SARS-CoV-2 target nucleic acids are NOT DETECTED.  The SARS-CoV-2 RNA is generally detectable in upper respiratory specimens during the acute phase of infection. The lowest concentration of SARS-CoV-2 viral copies this assay can detect is 138 copies/mL. A negative result does not preclude SARS-Cov-2 infection and should not be used as the sole basis for treatment or other patient management decisions. A negative result may occur with  improper specimen collection/handling, submission of specimen other than nasopharyngeal swab, presence of viral mutation(s) within the areas targeted by this assay, and inadequate number of viral copies(<138 copies/mL). A negative result must be combined with clinical observations, patient history, and epidemiological information. The expected result is Negative.  Fact Sheet for Patients:   BloggerCourse.com  Fact Sheet for Healthcare Providers:  SeriousBroker.it  This test is no t yet approved or cleared by the [place] FDA and  has been authorized for detection and/or diagnosis of SARS-CoV-2 by FDA under an Emergency Use Authorization (EUA). This EUA will remain  in effect (meaning this test can be used) for the duration of the COVID-19 declaration under Section 564(b)(1) of the Act, 21 U.S.C.section 360bbb-3(b)(1), unless the authorization is terminated  or revoked sooner.       Influenza A by PCR NEGATIVE NEGATIVE Final   Influenza B by PCR NEGATIVE NEGATIVE Final    Comment: (NOTE) The Xpert Xpress SARS-CoV-2/FLU/RSV  plus assay is intended as an aid in the diagnosis of influenza from Nasopharyngeal swab specimens and should not be used as a sole basis for treatment. Nasal washings and aspirates are unacceptable for Xpert Xpress SARS-CoV-2/FLU/RSV testing.  Fact Sheet for Patients: BloggerCourse.com  Fact Sheet for Healthcare Providers: SeriousBroker.it  This test is not yet approved or cleared by the [place] FDA and has been authorized for detection and/or diagnosis of SARS-CoV-2 by FDA under an Emergency Use Authorization (EUA). This EUA will remain in effect (meaning this test can be used) for the duration of the COVID-19 declaration under Section 564(b)(1) of the Act, 21 U.S.C. section 360bbb-3(b)(1), unless the authorization is terminated or revoked.     Resp Syncytial Virus by PCR NEGATIVE NEGATIVE Final    Comment: (NOTE) Fact Sheet for Patients: BloggerCourse.com  Fact Sheet for Healthcare Providers: SeriousBroker.it  This test is not yet approved or cleared by the [place] FDA and has been authorized for detection and/or diagnosis of SARS-CoV-2 by FDA under an Emergency Use  Authorization (EUA). This EUA will remain in effect (meaning this test can be used) for the duration of the COVID-19 declaration under Section 564(b)(1) of the Act, 21 U.S.C. section 360bbb-3(b)(1), unless the authorization is terminated or revoked.  Performed at [facility], [address] [place], [name] [address]      Labs: BNP (last 3 results) Recent Labs    07/11/23 0653  BNP 125.3*   Basic Metabolic Panel: Recent Labs  Lab 07/11/23 0837 07/11/23 1423 07/12/23 0432  NA 140 142 138  K 5.2* 4.7 4.9  CL 111 112* 107  CO2  --  19* 23  GLUCOSE 76 90 62*  BUN 47* 42* 39*  CREATININE 1.90* 1.85* 1.63*  CALCIUM  --  8.0* 7.9*   Liver Function Tests: Recent Labs  Lab 07/11/23 1423 07/12/23 0432  AST 24 25  ALT 27 27  ALKPHOS 54 61  BILITOT 0.6 0.3  PROT 5.9* 6.2*  ALBUMIN 2.2* 2.0*   No results for input(s): "LIPASE", "AMYLASE" in the last 168 hours. No results for input(s): "AMMONIA" in the last 168 hours. CBC: Recent Labs  Lab 07/11/23 0653 07/11/23 0837 07/12/23 0432  WBC 19.3*  --  25.3*  NEUTROABS 17.0*  --   --   HGB 11.7* 12.2* 10.8*  HCT 38.3* 36.0* 34.2*  MCV 98.7  --  98.3  PLT 313  --  286   Cardiac Enzymes: No results for input(s): "CKTOTAL", "CKMB", "CKMBINDEX", "TROPONINI" in the last 168 hours. BNP: Invalid input(s): "POCBNP" CBG: No results for input(s): "GLUCAP" in the last 168 hours. D-Dimer No results for input(s): "DDIMER" in the last 72 hours. Hgb A1c No results for input(s): "HGBA1C" in the last 72 hours. Lipid Profile No results for input(s): "CHOL", "HDL", "LDLCALC", "TRIG", "CHOLHDL", "LDLDIRECT" in the last 72 hours. Thyroid function studies No results for input(s): "TSH", "T4TOTAL", "T3FREE", "THYROIDAB" in the last 72 hours.  Invalid input(s): "FREET3" Anemia work up No results for input(s): "VITAMINB12", "FOLATE", "FERRITIN", "TIBC", "IRON", "RETICCTPCT" in the last 72 hours. Urinalysis     Component Value Date/Time   COLORURINE YELLOW 07/16/2022 1626   APPEARANCEUR CLEAR 07/16/2022 1626   LABSPEC 1.026 07/16/2022 1626   PHURINE 5.5 07/16/2022 1626   GLUCOSEU NEGATIVE 07/16/2022 1626   HGBUR NEGATIVE 07/16/2022 1626   BILIRUBINUR NEGATIVE 07/16/2022 1626   KETONESUR TRACE (A) 07/16/2022 1626   PROTEINUR 30 (A) 07/16/2022 1626   UROBILINOGEN 1.0 06/29/2014  1117   NITRITE NEGATIVE 07/16/2022 1626   LEUKOCYTESUR NEGATIVE 07/16/2022 1626   Sepsis Labs Recent Labs  Lab 07/11/23 0653 07/12/23 0432  WBC 19.3* 25.3*   Microbiology Recent Results (from the past 240 hours)  Culture, blood (Routine x 2)     Status: Abnormal (Preliminary result)   Collection Time: 07/11/23  6:50 AM   Specimen: BLOOD  Result Value Ref Range Status   Specimen Description   Final    BLOOD BLOOD LEFT FOREARM Performed at [facility], [address], [place], [name] [address]    Special Requests   Final    BOTTLES DRAWN AEROBIC AND ANAEROBIC Blood Culture adequate volume Performed at [facility], [address], [place], [name] [address]    Culture  Setup Time   Final    GRAM POSITIVE COCCI IN PAIRS IN CHAINS ANAEROBIC BOTTLE ONLY CRITICAL VALUE NOTED.  VALUE IS CONSISTENT WITH PREVIOUSLY REPORTED AND CALLED VALUE. Performed at [facility], [address] [place], [name] [address]    Culture GROUP B STREP(S.AGALACTIAE)ISOLATED (A)  Final   Report Status PENDING  Incomplete  Culture, blood (Routine x 2)     Status: Abnormal (Preliminary result)   Collection Time: 07/11/23  6:53 AM   Specimen: BLOOD  Result Value Ref Range Status   Specimen Description   Final    BLOOD LEFT ANTECUBITAL Performed at [facility], [address] [place], [name] [address]    Special Requests   Final    BOTTLES DRAWN AEROBIC AND ANAEROBIC Blood Culture results may not be optimal due to an inadequate volume of blood received in culture  bottles Performed at [facility], [address] [place], [name] [address]    Culture  Setup Time   Final    GRAM POSITIVE COCCI IN PAIRS IN CHAINS IN BOTH AEROBIC AND ANAEROBIC BOTTLES CRITICAL RESULT CALLED TO, READ BACK BY AND VERIFIED WITH: PHARMD E JACKSON 07/12/2023 @ 0234 BY AB    Culture (A)  Final    GROUP B STREP(S.AGALACTIAE)ISOLATED CULTURE REINCUBATED FOR BETTER GROWTH Performed at [facility], [address] [place], [name] [address]    Report Status PENDING  Incomplete  Blood Culture ID Panel (Reflexed)     Status: Abnormal   Collection Time: 07/11/23  6:53 AM  Result Value Ref Range Status   Enterococcus faecalis NOT DETECTED NOT DETECTED Final   Enterococcus Faecium NOT DETECTED NOT DETECTED Final   Listeria monocytogenes NOT DETECTED NOT DETECTED Final   Staphylococcus species NOT DETECTED NOT DETECTED Final   Staphylococcus aureus (BCID) NOT DETECTED NOT DETECTED Final   Staphylococcus epidermidis NOT DETECTED NOT DETECTED Final   Staphylococcus lugdunensis NOT DETECTED NOT DETECTED Final   Streptococcus species DETECTED (A) NOT DETECTED Final    Comment: CRITICAL RESULT CALLED TO, READ BACK BY AND VERIFIED WITH: PHARMD E JACKSON 07/12/2023 @ 0234 BY AB    Streptococcus agalactiae DETECTED (A) NOT DETECTED Final    Comment: CRITICAL RESULT CALLED TO, READ BACK BY AND VERIFIED WITH: PHARMD E JACKSON 07/12/2023 @ 0234 BY AB    Streptococcus pneumoniae NOT DETECTED NOT DETECTED Final   Streptococcus pyogenes NOT DETECTED NOT DETECTED Final   A.calcoaceticus-baumannii NOT DETECTED NOT DETECTED Final   Bacteroides fragilis NOT DETECTED NOT DETECTED Final   Enterobacterales NOT DETECTED NOT DETECTED Final   Enterobacter cloacae complex NOT DETECTED NOT DETECTED Final   Escherichia coli NOT DETECTED NOT  DETECTED Final   Klebsiella aerogenes NOT DETECTED NOT DETECTED Final   Klebsiella oxytoca NOT DETECTED NOT DETECTED Final    Klebsiella pneumoniae NOT DETECTED NOT DETECTED Final   Proteus species NOT DETECTED NOT DETECTED Final   Salmonella species NOT DETECTED NOT DETECTED Final   Serratia marcescens NOT DETECTED NOT DETECTED Final   Haemophilus influenzae NOT DETECTED NOT DETECTED Final   Neisseria meningitidis NOT DETECTED NOT DETECTED Final   Pseudomonas aeruginosa NOT DETECTED NOT DETECTED Final   Stenotrophomonas maltophilia NOT DETECTED NOT DETECTED Final   Candida albicans NOT DETECTED NOT DETECTED Final   Candida auris NOT DETECTED NOT DETECTED Final   Candida glabrata NOT DETECTED NOT DETECTED Final   Candida krusei NOT DETECTED NOT DETECTED Final   Candida parapsilosis NOT DETECTED NOT DETECTED Final   Candida tropicalis NOT DETECTED NOT DETECTED Final   Cryptococcus neoformans/gattii NOT DETECTED NOT DETECTED Final    Comment: Performed at [facility], [address], [place], [name] [address]  Resp panel by RT-PCR (RSV, Flu A&B, Covid) Anterior Nasal Swab     Status: None   Collection Time: 07/11/23  8:30 AM   Specimen: Anterior Nasal Swab  Result Value Ref Range Status   SARS Coronavirus 2 by RT PCR NEGATIVE NEGATIVE Final    Comment: (NOTE) SARS-CoV-2 target nucleic acids are NOT DETECTED.  The SARS-CoV-2 RNA is generally detectable in upper respiratory specimens during the acute phase of infection. The lowest concentration of SARS-CoV-2 viral copies this assay can detect is 138 copies/mL. A negative result does not preclude SARS-Cov-2 infection and should not be used as the sole basis for treatment or other patient management decisions. A negative result may occur with  improper specimen collection/handling, submission of specimen other than nasopharyngeal swab, presence of viral mutation(s) within the areas targeted by this assay, and inadequate number of viral copies(<138 copies/mL). A negative result must be combined with clinical observations, patient history, and  epidemiological information. The expected result is Negative.  Fact Sheet for Patients:  BloggerCourse.com  Fact Sheet for Healthcare Providers:  SeriousBroker.it  This test is no t yet approved or cleared by the [place] FDA and  has been authorized for detection and/or diagnosis of SARS-CoV-2 by FDA under an Emergency Use Authorization (EUA). This EUA will remain  in effect (meaning this test can be used) for the duration of the COVID-19 declaration under Section 564(b)(1) of the Act, 21 U.S.C.section 360bbb-3(b)(1), unless the authorization is terminated  or revoked sooner.       Influenza A by PCR NEGATIVE NEGATIVE Final   Influenza B by PCR NEGATIVE NEGATIVE Final    Comment: (NOTE) The Xpert Xpress SARS-CoV-2/FLU/RSV plus assay is intended as an aid in the diagnosis of influenza from Nasopharyngeal swab specimens and should not be used as a sole basis for treatment. Nasal washings and aspirates are unacceptable for Xpert Xpress SARS-CoV-2/FLU/RSV testing.  Fact Sheet for Patients: BloggerCourse.com  Fact Sheet for Healthcare Providers: SeriousBroker.it  This test is not yet approved or cleared by the [place] FDA and has been authorized for detection and/or diagnosis of SARS-CoV-2 by FDA under an Emergency Use Authorization (EUA). This EUA will remain in effect (meaning this test can be used) for the duration of the COVID-19 declaration under Section 564(b)(1) of the Act, 21 U.S.C. section 360bbb-3(b)(1), unless the authorization is terminated or revoked.     Resp Syncytial Virus by PCR NEGATIVE NEGATIVE Final    Comment: (NOTE) Fact Sheet  for Patients: BloggerCourse.com  Fact Sheet for Healthcare Providers: SeriousBroker.it  This test is not yet approved or cleared by the [place] FDA and has been  authorized for detection and/or diagnosis of SARS-CoV-2 by FDA under an Emergency Use Authorization (EUA). This EUA will remain in effect (meaning this test can be used) for the duration of the COVID-19 declaration under Section 564(b)(1) of the Act, 21 U.S.C. section 360bbb-3(b)(1), unless the authorization is terminated or revoked.  Performed at [facility], [address], [place], [name] [address]      Time coordinating discharge: Over 30 minutes  SIGNED:   Azucena Fallen, DO [facility] 07/12/2023, 3:45 PM Pager   If 7PM-7AM, please contact night-coverage [email or web address]

## 2023-07-12 NOTE — Care Management CC44 (Signed)
 Condition Code 44 Documentation Completed  Patient Details  Name: Carlos Hanson MRN: [id number] Date of Birth: [birth year]   Condition Code 44 given:  Yes Patient signature on Condition Code 44 notice:  Yes Documentation of 2 MD's agreement:  Yes Code 44 added to claim:  Yes    Lanier Clam, RN 07/12/2023, 4:00 PM

## 2023-07-12 NOTE — Progress Notes (Signed)
 PHARMACY - PHYSICIAN COMMUNICATION CRITICAL VALUE ALERT - BLOOD CULTURE IDENTIFICATION (BCID)  Carlos Hanson is an 83 y.o. male who presented to [facility] on 07/11/2023 with a chief complaint of fever followed by delirium at home   Assessment: 3/4 Group B strep  Name of physician (or Provider) Contacted: Johann Capers  Current antibiotics: CTX and Azith  Changes to prescribed antibiotics recommended:  D/c CTX, start ancef 2gm IV q8h  Results for orders placed or performed during the hospital encounter of 07/11/23  Blood Culture ID Panel (Reflexed) (Collected: 07/11/2023  6:53 AM)  Result Value Ref Range   Enterococcus faecalis NOT DETECTED NOT DETECTED   Enterococcus Faecium NOT DETECTED NOT DETECTED   Listeria monocytogenes NOT DETECTED NOT DETECTED   Staphylococcus species NOT DETECTED NOT DETECTED   Staphylococcus aureus (BCID) NOT DETECTED NOT DETECTED   Staphylococcus epidermidis NOT DETECTED NOT DETECTED   Staphylococcus lugdunensis NOT DETECTED NOT DETECTED   Streptococcus species DETECTED (A) NOT DETECTED   Streptococcus agalactiae DETECTED (A) NOT DETECTED   Streptococcus pneumoniae NOT DETECTED NOT DETECTED   Streptococcus pyogenes NOT DETECTED NOT DETECTED   A.calcoaceticus-baumannii NOT DETECTED NOT DETECTED   Bacteroides fragilis NOT DETECTED NOT DETECTED   Enterobacterales NOT DETECTED NOT DETECTED   Enterobacter cloacae complex NOT DETECTED NOT DETECTED   Escherichia coli NOT DETECTED NOT DETECTED   Klebsiella aerogenes NOT DETECTED NOT DETECTED   Klebsiella oxytoca NOT DETECTED NOT DETECTED   Klebsiella pneumoniae NOT DETECTED NOT DETECTED   Proteus species NOT DETECTED NOT DETECTED   Salmonella species NOT DETECTED NOT DETECTED   Serratia marcescens NOT DETECTED NOT DETECTED   Haemophilus influenzae NOT DETECTED NOT DETECTED   Neisseria meningitidis NOT DETECTED NOT DETECTED   Pseudomonas aeruginosa NOT DETECTED NOT DETECTED   Stenotrophomonas maltophilia NOT  DETECTED NOT DETECTED   Candida albicans NOT DETECTED NOT DETECTED   Candida auris NOT DETECTED NOT DETECTED   Candida glabrata NOT DETECTED NOT DETECTED   Candida krusei NOT DETECTED NOT DETECTED   Candida parapsilosis NOT DETECTED NOT DETECTED   Candida tropicalis NOT DETECTED NOT DETECTED   Cryptococcus neoformans/gattii NOT DETECTED NOT DETECTED   Arley Phenix RPh 07/12/2023, 2:54 AM

## 2023-07-13 ENCOUNTER — Other Ambulatory Visit: Payer: Self-pay | Admitting: Internal Medicine

## 2023-07-13 MED ORDER — CEFADROXIL 500 MG PO CAPS: 1000.0000 mg | ORAL_CAPSULE | Freq: Two times a day (BID) | ORAL | 0 refills | Status: AC

## 2023-07-13 NOTE — Progress Notes (Signed)
 Notified by pharmacy patient had blood cultures result positive for group B strep, cefadroxil ordered per sensitivities. Patient informed to pick up this medication and discontinue prior antibiotics (azithromycin).

## 2023-07-14 LAB — CULTURE, BLOOD (ROUTINE X 2): Special Requests: ADEQUATE

## 2023-07-19 DIAGNOSIS — I480 Paroxysmal atrial fibrillation: Secondary | ICD-10-CM | POA: Diagnosis not present

## 2023-07-19 DIAGNOSIS — R600 Localized edema: Secondary | ICD-10-CM | POA: Diagnosis not present

## 2023-07-19 DIAGNOSIS — Z7901 Long term (current) use of anticoagulants: Secondary | ICD-10-CM | POA: Diagnosis not present

## 2023-07-19 DIAGNOSIS — J189 Pneumonia, unspecified organism: Secondary | ICD-10-CM | POA: Diagnosis not present

## 2023-07-27 DIAGNOSIS — H26493 Other secondary cataract, bilateral: Secondary | ICD-10-CM | POA: Diagnosis not present

## 2023-08-03 DIAGNOSIS — H26493 Other secondary cataract, bilateral: Secondary | ICD-10-CM | POA: Diagnosis not present

## 2023-08-05 DIAGNOSIS — J189 Pneumonia, unspecified organism: Secondary | ICD-10-CM | POA: Diagnosis not present

## 2023-08-05 DIAGNOSIS — D6869 Other thrombophilia: Secondary | ICD-10-CM | POA: Diagnosis not present

## 2023-08-05 DIAGNOSIS — I480 Paroxysmal atrial fibrillation: Secondary | ICD-10-CM | POA: Diagnosis not present

## 2023-08-05 DIAGNOSIS — R600 Localized edema: Secondary | ICD-10-CM | POA: Diagnosis not present

## 2023-08-05 DIAGNOSIS — Z7901 Long term (current) use of anticoagulants: Secondary | ICD-10-CM | POA: Diagnosis not present

## 2023-08-09 ENCOUNTER — Ambulatory Visit: Payer: PPO | Admitting: Cardiology

## 2023-08-09 ENCOUNTER — Ambulatory Visit: Payer: PPO | Attending: Cardiology | Admitting: Cardiology

## 2023-08-09 ENCOUNTER — Encounter: Payer: Self-pay | Admitting: Cardiology

## 2023-08-09 VITALS — BP 126/60 | HR 81 | Resp 16 | Ht 71.0 in | Wt 291.8 lb

## 2023-08-09 DIAGNOSIS — I453 Trifascicular block: Secondary | ICD-10-CM

## 2023-08-09 DIAGNOSIS — I480 Paroxysmal atrial fibrillation: Secondary | ICD-10-CM | POA: Diagnosis not present

## 2023-08-09 DIAGNOSIS — N1831 Chronic kidney disease, stage 3a: Secondary | ICD-10-CM | POA: Diagnosis not present

## 2023-08-09 DIAGNOSIS — I10 Essential (primary) hypertension: Secondary | ICD-10-CM

## 2023-08-09 MED ORDER — APIXABAN 5 MG PO TABS: 5.0000 mg | ORAL_TABLET | Freq: Two times a day (BID) | ORAL | 3 refills | Status: DC

## 2023-08-09 NOTE — Patient Instructions (Signed)
 Medication Instructions:  Your physician has recommended you make the following change in your medication:  Stop Xarelto Start Eliquis 5 mg by mouth twice daily   *If you need a refill on your cardiac medications before your next appointment, please call your pharmacy*  Lab Work: none If you have labs (blood work) drawn today and your tests are completely normal, you will receive your results only by: MyChart Message (if you have MyChart) OR A paper copy in the mail If you have any lab test that is abnormal or we need to change your treatment, we will call you to review the results.  Testing/Procedures: none  Follow-Up: At [facility], you and your health needs are our priority.  As part of our continuing mission to provide you with exceptional heart care, our providers are all part of one team.  This team includes your primary Cardiologist (physician) and Advanced Practice Providers or APPs (Physician Assistants and Nurse Practitioners) who all work together to provide you with the care you need, when you need it.  Your next appointment:   12 month(s)  Provider:   Dr Jacinto Halim    We recommend signing up for the patient portal called "MyChart".  Sign up information is provided on this After Visit Summary.  MyChart is used to connect with patients for Virtual Visits (Telemedicine).  Patients are able to view lab/test results, encounter notes, upcoming appointments, etc.  Non-urgent messages can be sent to your provider as well.   To learn more about what you can do with MyChart, go to [email or web address].   Other Instructions    1st Floor: - Lobby - Registration  - Pharmacy  - Lab - Cafe  2nd Floor: - PV Lab - Diagnostic Testing (echo, CT, nuclear med)  3rd Floor: - Vacant  4th Floor: - TCTS (cardiothoracic surgery) - AFib Clinic - Structural Heart Clinic - Vascular Surgery  - Vascular Ultrasound  5th Floor: - HeartCare Cardiology (general and EP) -  Clinical Pharmacy for coumadin, hypertension, lipid, weight-loss medications, and med management appointments    Valet parking services will be available as well.

## 2023-08-09 NOTE — Progress Notes (Signed)
 Cardiology Office Note:  .   Date:  08/09/2023  ID:  Carlos Hanson, DOB 1941-01-20, MRN [id number] PCP: Garlan Fillers, MD  [facility] Providers Cardiologist:  Yates Decamp, MD   History of Present Illness: .   Carlos Hanson is a 83 y.o. Caucasian male  with  morbid obesity, hypertension, obstructive sleep apnea on CPAP and chronic back pain.  He has chronic dyspnea on exertion, presently tolerating low-dose amiodarone and Xarelto and has maintained sinus rhythm.  Presents here for annual visit.    Patient was admitted to the hospital on 07/11/2023 with altered mental status, sepsis and respiratory distress and pneumonia.  No organism was isolated, for sepsis with antibiotics and eventually discharged home.  Discussed the use of AI scribe software for clinical note transcription with the patient, who gave verbal consent to proceed.  History of Present Illness    The patient reports feeling well since recent hospital discharge.  Wife raises a concern about prolonged bleeding from minor injuries, which he attributes to the patient's current anticoagulant medication, Xarelto. He reports a recent nosebleed that resulted in significant blood loss. Expresses a preference for switching to Eliquis, which she personally takes without similar issues.  The patient also had a recent episode of swelling in the left leg during hospitalization, which has since resolved with the use of compression stockings. No clots were found during evaluation of this issue.      Labs   Lab Results  Component Value Date   NA 138 07/12/2023   K 4.9 07/12/2023   CO2 23 07/12/2023   GLUCOSE 62 (L) 07/12/2023   BUN 39 (H) 07/12/2023   CREATININE 1.63 (H) 07/12/2023   CALCIUM 7.9 (L) 07/12/2023   GFRNONAA 41 (L) 07/12/2023      Latest Ref Rng & Units 07/12/2023    4:32 AM 07/11/2023    2:23 PM 07/11/2023    8:37 AM  BMP  Glucose 70 - 99 mg/dL 62  90  76   BUN 8 - 23 mg/dL 39  42  47   Creatinine 0.61 - 1.24  mg/dL 4.09  8.11  9.14   Sodium 135 - 145 mmol/L 138  142  140   Potassium 3.5 - 5.1 mmol/L 4.9  4.7  5.2   Chloride 98 - 111 mmol/L 107  112  111   CO2 22 - 32 mmol/L 23  19    Calcium 8.9 - 10.3 mg/dL 7.9  8.0        Latest Ref Rng & Units 07/12/2023    4:32 AM 07/11/2023    8:37 AM 07/11/2023    6:53 AM  CBC  WBC 4.0 - 10.5 K/uL 25.3   19.3   Hemoglobin 13.0 - 17.0 g/dL 78.2  95.6  21.3   Hematocrit 39.0 - 52.0 % 34.2  36.0  38.3   Platelets 150 - 400 K/uL 286   313    PCP labs on [facility] 05/06/2023:  Total cholesterol 140, triglycerides 58, HDL 88, LDL 40.  A1c 5.5%.  TSH normal at 3.160.   Review of Systems  Cardiovascular:  Negative for chest pain, dyspnea on exertion and leg swelling.   Physical Exam:   VS:  BP 126/60 (BP Location: Left Arm, Patient Position: Sitting, Cuff Size: Large)   Pulse 81   Resp 16   Ht 5\' 11"  (1.803 m)   Wt 291 lb 12.8 oz (132.4 kg)   SpO2 95%   BMI 40.70  kg/m    Wt Readings from Last 3 Encounters:  08/09/23 291 lb 12.8 oz (132.4 kg)  07/11/23 294 lb 12.1 oz (133.7 kg)  09/15/22 295 lb 8 oz (134 kg)     Physical Exam Constitutional:      Appearance: He is morbidly obese.  Neck:     Vascular: No carotid bruit or JVD.  Cardiovascular:     Rate and Rhythm: Normal rate and regular rhythm.     Pulses: Intact distal pulses.     Heart sounds: Normal heart sounds. No murmur heard.    No gallop.  Pulmonary:     Effort: Pulmonary effort is normal.     Breath sounds: Normal breath sounds.  Abdominal:     General: Bowel sounds are normal.     Palpations: Abdomen is soft.  Musculoskeletal:     Right lower leg: No edema.     Left lower leg: No edema.    Studies Reviewed: Marland Kitchen     EKG:    EKG Interpretation Date/Time:  Monday August 09 2023 11:24:15 EDT Ventricular Rate:  67 PR Interval:  228 QRS Duration:  126 QT Interval:  428 QTC Calculation: 452 R Axis:   -68  Text Interpretation: EKG 08/09/2023: Sinus rhythm with first-degree block  at the rate of 67 bpm, left anterior fascicular block.  Right bundle branch block.  Trifascicular block.  Compared to 07/11/2023, A-fib with RVR not present. Confirmed by Delrae Rend [phone]) on 08/09/2023 11:41:53 AM    Medications and allergies    Allergies  Allergen Reactions   Celecoxib Other (See Comments)    Ineffective   Penicillin G Sodium Nausea Only   Naprosyn [Naproxen] Rash     Current Outpatient Medications:    acetaminophen (TYLENOL) 500 MG tablet, Take 2 tablets (1,000 mg total) by mouth every 8 (eight) hours. (Patient taking differently: Take 1,000-1,500 mg by mouth daily as needed for mild pain (pain score 1-3) or moderate pain (pain score 4-6).), Disp: 30 tablet, Rfl: 0   amiodarone (PACERONE) 200 MG tablet, TAKE 1/2 TABLET BY MOUTH DAILY, Disp: 45 tablet, Rfl: 3   apixaban (ELIQUIS) 5 MG TABS tablet, Take 1 tablet (5 mg total) by mouth 2 (two) times daily., Disp: 180 tablet, Rfl: 3   atorvastatin (LIPITOR) 20 MG tablet, Take 1 tablet by mouth daily., Disp: , Rfl:    clobetasol (TEMOVATE) 0.05 % external solution, Apply 1 Application topically daily as needed (psoriasis)., Disp: , Rfl:    diltiazem (CARDIZEM CD) 120 MG 24 hr capsule, Take 120 mg by mouth daily., Disp: , Rfl:    donepezil (ARICEPT) 5 MG tablet, Take 5 mg by mouth at bedtime., Disp: , Rfl:    DULoxetine (CYMBALTA) 60 MG capsule, Take 60 mg by mouth in the morning, at noon, and at bedtime., Disp: , Rfl:    famotidine (PEPCID) 20 MG tablet, Take 20 mg by mouth daily., Disp: , Rfl:    gabapentin (NEURONTIN) 300 MG capsule, Take 300 mg by mouth 2 (two) times daily., Disp: , Rfl:    ketoconazole (NIZORAL) 2 % cream, Apply topically 2 (two) times daily., Disp: , Rfl:    lactulose (CHRONULAC) 10 GM/15ML solution, Take 30 mLs (20 g total) by mouth daily., Disp: 240 mL, Rfl: 1   levothyroxine (SYNTHROID) 50 MCG tablet, Take 50 mcg by mouth daily before breakfast., Disp: , Rfl:    MELATONIN PO, Take 10 mg by mouth at  bedtime., Disp: , Rfl:  Multiple Vitamins-Minerals (VISION FORMULA EYE HEALTH) CAPS, Take 1 tablet by mouth 2 (two) times daily. [employer], Disp: , Rfl:    OVER THE COUNTER MEDICATION, Take 1 tablet by mouth daily. Pain away, Disp: , Rfl:    oxyCODONE (OXYCONTIN) 10 mg 12 hr tablet, Take 10 mg by mouth every 12 (twelve) hours., Disp: , Rfl:    oxymetazoline (AFRIN) 0.05 % nasal spray, Place 1 spray into both nostrils at bedtime as needed for congestion (sinuses)., Disp: , Rfl:    SENEXON-S 8.6-50 MG tablet, Take 1 tablet by mouth at bedtime., Disp: , Rfl:    Meds ordered this encounter  Medications   apixaban (ELIQUIS) 5 MG TABS tablet    Sig: Take 1 tablet (5 mg total) by mouth 2 (two) times daily.    Dispense:  180 tablet    Refill:  3    Discontinue Xarelto     Medications Discontinued During This Encounter  Medication Reason   azithromycin (ZITHROMAX Z-PAK) 250 MG tablet Completed Course   rivaroxaban (XARELTO) 20 MG TABS tablet Change in therapy     ASSESSMENT AND PLAN: .      ICD-10-CM   1. 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 Essential hypertension  I10     4. Stage 3a chronic kidney disease (HCC)  N18.31      Assessment and Plan    Paroxysmal Atrial Fibrillation   Previously in atrial fibrillation during hospitalization for pneumonia and sepsis, he is now in sinus rhythm. Currently on amiodarone 100 mg, maintaining well-controlled rhythm and blood pressure. Continue amiodarone 100 mg daily and monitor rhythm.  TSH has been normal, no significant abnormality on chest x-ray that suggest infiltrative process with amiodarone, he is also on minimal dose.  Anticoagulation Management   Currently on Xarelto but experiencing prolonged bleeding, including significant epistaxis. Prefers to switch to Eliquis, which he believes causes fewer bleeding issues based on personal  experience. Decision to switch to Eliquis 5 mg twice daily was made considering his preference and previous experience with the medication. Finish current Xarelto prescription, then switch to Eliquis 5 mg twice daily.   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  Kidney function remains stable with no acute changes. Continue monitoring kidney function.  Could consider addition of ACE inhibitor or ARB for renal protection.    Office visit in a year or sooner if problems.  Signed,  Yates Decamp, MD, [facility] 08/09/2023, 5:33 PM [facility] [address] [place], [name] [address] Phone: [phone]. Fax:  [phone]

## 2023-08-12 DIAGNOSIS — Z961 Presence of intraocular lens: Secondary | ICD-10-CM | POA: Diagnosis not present

## 2023-08-12 DIAGNOSIS — G4733 Obstructive sleep apnea (adult) (pediatric): Secondary | ICD-10-CM | POA: Diagnosis not present

## 2023-08-12 DIAGNOSIS — H26493 Other secondary cataract, bilateral: Secondary | ICD-10-CM | POA: Diagnosis not present

## 2023-08-23 DIAGNOSIS — I872 Venous insufficiency (chronic) (peripheral): Secondary | ICD-10-CM | POA: Diagnosis not present

## 2023-08-23 DIAGNOSIS — G4733 Obstructive sleep apnea (adult) (pediatric): Secondary | ICD-10-CM | POA: Diagnosis not present

## 2023-08-23 DIAGNOSIS — Z7901 Long term (current) use of anticoagulants: Secondary | ICD-10-CM | POA: Diagnosis not present

## 2023-08-23 DIAGNOSIS — I480 Paroxysmal atrial fibrillation: Secondary | ICD-10-CM | POA: Diagnosis not present

## 2023-08-23 DIAGNOSIS — R7301 Impaired fasting glucose: Secondary | ICD-10-CM | POA: Diagnosis not present

## 2023-08-23 DIAGNOSIS — B353 Tinea pedis: Secondary | ICD-10-CM | POA: Diagnosis not present

## 2023-08-23 DIAGNOSIS — R809 Proteinuria, unspecified: Secondary | ICD-10-CM | POA: Diagnosis not present

## 2023-08-23 DIAGNOSIS — F1120 Opioid dependence, uncomplicated: Secondary | ICD-10-CM | POA: Diagnosis not present

## 2023-08-23 DIAGNOSIS — I129 Hypertensive chronic kidney disease with stage 1 through stage 4 chronic kidney disease, or unspecified chronic kidney disease: Secondary | ICD-10-CM | POA: Diagnosis not present

## 2023-08-23 DIAGNOSIS — N1831 Chronic kidney disease, stage 3a: Secondary | ICD-10-CM | POA: Diagnosis not present

## 2023-08-23 DIAGNOSIS — M545 Low back pain, unspecified: Secondary | ICD-10-CM | POA: Diagnosis not present

## 2023-09-06 DIAGNOSIS — L97421 Non-pressure chronic ulcer of left heel and midfoot limited to breakdown of skin: Secondary | ICD-10-CM | POA: Diagnosis not present

## 2023-09-06 DIAGNOSIS — I872 Venous insufficiency (chronic) (peripheral): Secondary | ICD-10-CM | POA: Diagnosis not present

## 2023-09-06 DIAGNOSIS — Z7901 Long term (current) use of anticoagulants: Secondary | ICD-10-CM | POA: Diagnosis not present

## 2023-09-06 DIAGNOSIS — L03115 Cellulitis of right lower limb: Secondary | ICD-10-CM | POA: Diagnosis not present

## 2023-09-16 ENCOUNTER — Ambulatory Visit: Admitting: Orthopedic Surgery

## 2023-09-16 DIAGNOSIS — I872 Venous insufficiency (chronic) (peripheral): Secondary | ICD-10-CM | POA: Diagnosis not present

## 2023-09-16 DIAGNOSIS — L97511 Non-pressure chronic ulcer of other part of right foot limited to breakdown of skin: Secondary | ICD-10-CM

## 2023-09-17 ENCOUNTER — Encounter: Payer: Self-pay | Admitting: Orthopedic Surgery

## 2023-09-17 NOTE — Progress Notes (Signed)
 Office Visit Note   Patient: Carlos Hanson           Date of Birth: [birth year]           MRN: [id number] Visit Date: 09/16/2023              Requested by: Arma Lamp, NP [address] [place],  [name] [address] PCP: Bertha Broad, MD  Chief Complaint  Patient presents with   Left Foot - Open Wound      HPI: Patient is an 83 year old gentleman who presents with ulcerations on his feet right worse than left.  Patient states that he has been having increasing swelling in the right leg as well.  Patient states he has tried ketoconazole for the fungal dermatitis.  Patient is on Eliquis .  Assessment & Plan: Visit Diagnoses:  1. Non-pressure chronic ulcer of other part of right foot limited to breakdown of skin (HCC)   2. Venous stasis dermatitis of both lower extremities     Plan: Recommended a double extra-large knee-high compression sock.  Recommended against using cotton or polyester socks and will need new shoe wear.  Follow-Up Instructions: Return if symptoms worsen or fail to improve.   Ortho Exam  Patient is alert, oriented, no adenopathy, well-dressed, normal affect, normal respiratory effort. Examination patient has venous swelling in both legs worse in the right than the left.  Right calf is 46 cm in circumference.  Patient has fungal dermatitis of both feet with dry cracked skin.  He has palpable dorsalis pedis pulses.  There is no cellulitis no signs of infection.  Imaging: No results found. No images are attached to the encounter.  Labs: Lab Results  Component Value Date   REPTSTATUS 07/14/2023 FINAL 07/11/2023   CULT GROUP B STREP(S.AGALACTIAE)ISOLATED (A) 07/11/2023   LABORGA GROUP B STREP(S.AGALACTIAE)ISOLATED 07/11/2023     Lab Results  Component Value Date   ALBUMIN 2.0 (L) 07/12/2023   ALBUMIN 2.2 (L) 07/11/2023   ALBUMIN 3.8 07/16/2022    No results found for: "MG" No results found for: "VD25OH"  No results found for: "PREALBUMIN"     Latest Ref Rng & Units 07/12/2023    4:32 AM 07/11/2023    8:37 AM 07/11/2023    6:53 AM  CBC EXTENDED  WBC 4.0 - 10.5 K/uL 25.3   19.3   RBC 4.22 - 5.81 MIL/uL 3.48   3.88   Hemoglobin 13.0 - 17.0 g/dL 19.1  47.8  29.5   HCT 39.0 - 52.0 % 34.2  36.0  38.3   Platelets 150 - 400 K/uL 286   313   NEUT# 1.7 - 7.7 K/uL   17.0   Lymph# 0.7 - 4.0 K/uL   0.8      There is no height or weight on file to calculate BMI.  Orders:  No orders of the defined types were placed in this encounter.  No orders of the defined types were placed in this encounter.    Procedures: No procedures performed  Clinical Data: No additional findings.  ROS:  All other systems negative, except as noted in the HPI. Review of Systems  Objective: Vital Signs: There were no vitals taken for this visit.  Specialty Comments:  No specialty comments available.  PMFS History: Patient Active Problem List   Diagnosis Date Noted   Acute respiratory failure with hypoxia (HCC) 07/12/2023   Sepsis due to undetermined organism (HCC) 07/11/2023   Multifocal pneumonia 07/11/2023   Acute respiratory insufficiency 07/11/2023  Hypothyroidism 11/03/2018   Essential hypertension 07/14/2018   Dysphagia 02/22/2017   Major depression, single episode 08/10/2016   Fall 05/01/2016   Morbid obesity due to excess calories (HCC) 05/16/2015   Chronic pain syndrome 05/16/2015   OSA on CPAP 05/16/2015   Cataract 01/23/2015   RLS (restless legs syndrome) 07/09/2014   Paroxysmal atrial fibrillation (HCC) 07/09/2014   Class 3 obesity with alveolar hypoventilation, serious comorbidity, and body mass index (BMI) of 40.0 to 44.9 in adult (HCC) 07/09/2014   OSA treated with BiPAP 07/09/2014   Lower urinary tract infectious disease 06/29/2014    Class: Acute   Obstructive sleep apnea hypopnea, severe 06/29/2014    Class: Chronic   Hyperlipidemia 04/12/2009   Gastro-esophageal reflux disease without esophagitis 04/12/2009   Past  Medical History:  Diagnosis Date   Arthritis    Chronic back pain    Coronary artery disease    40% mLAD/DIAG bifurcation 03/25/10    Dysrhythmia    History of atrial fibrillation   Gout    H/O hiatal hernia    Hearing decreased    right ear   History of bladder infections    History of bleeding ulcers    History of ulcer disease    Hypercholesteremia    Nocturia    Obstructive sleep apnea hypopnea, severe    Sleep apnea    cpap sleep study 2011    Family History  Problem Relation Age of Onset   Heart failure Mother    Diabetes Mother    Arthritis Mother    High blood pressure Mother    Heart failure Father    Colon cancer Neg Hx    Sleep apnea Neg Hx     Past Surgical History:  Procedure Laterality Date   BACK SURGERY     CARDIAC CATHETERIZATION     2011   cardioverson     COLONOSCOPY  08/27/2011   Procedure: COLONOSCOPY;  Surgeon: Ruby Corporal, MD;  [facility]: [facility];  Service: Endoscopy;  Laterality: N/A;  930   ESOPHAGOGASTRODUODENOSCOPY     JOINT REPLACEMENT     left shoulder   Left shoulder replacement     LUMBAR LAMINECTOMY  03/15/2012   Procedure: LUMBAR LAMINECTOMY WITH  X-STOP 2 LEVEL;  Surgeon: Elna Haggis, MD;  Location: [facility];  Service: Neurosurgery;  Laterality: N/A;  Lumbar two-three, three-four XSTOP   TONSILLECTOMY     Social History   Occupational History    Comment: Retired  Tobacco Use   Smoking status: Former    Current packs/day: 0.00    Average packs/day: 1 pack/day for 25.0 years (25.0 ttl pk-yrs)    Types: Cigarettes    Start date: 07/13/1980    Quit date: 07/13/2005    Years since quitting: 18.1   Smokeless tobacco: Former    Types: Chew   Tobacco comments:    Quit 8 years ago.  Vaping Use   Vaping status: Never Used  Substance and Sexual Activity   Alcohol  use: Yes    Alcohol /week: 2.0 standard drinks of alcohol     Types: 2 Shots of liquor per week    Comment: once a week   Drug use: No   Sexual activity:  Not on file

## 2023-09-20 ENCOUNTER — Encounter: Payer: Self-pay | Admitting: *Deleted

## 2023-09-20 NOTE — Progress Notes (Signed)
 PATIENT: Carlos Hanson DOB: [birth year]  REASON FOR VISIT: follow up HISTORY FROM: patient PRIMARY NEUROLOGIST: Dr. Albertina Hugger   Virtual Visit via Video Note  I connected with Carlos Hanson on 09/20/23 at  1:00 PM EDT by a video enabled telemedicine application located remotely at [facility] and verified that I am speaking with the correct person using two identifiers who was located at their own home in [name].    I discussed the limitations of evaluation and management by telemedicine and the availability of in person appointments. The patient expressed understanding and agreed to proceed.   PATIENT: Carlos Hanson DOB: Sep 13, 1940  REASON FOR VISIT: follow up HISTORY FROM: patient  HISTORY OF PRESENT ILLNESS: Today 09/21/23   Carlos Hanson is a 83 y.o. male with a history of OSA on CPAP. Returns today for follow-up.  He reports that CPAP is working well for him.  He denies any new issues.  States that he has found it beneficial.  He was diagnosed with pneumonia back in March and was briefly in the hospital.  His download is below        REVIEW OF SYSTEMS: Out of a complete 14 system review of symptoms, the patient complains only of the following symptoms, and all other reviewed systems are negative.  ALLERGIES: Allergies  Allergen Reactions   Celecoxib Other (See Comments)    Ineffective   Penicillin G Sodium Nausea Only   Naprosyn [Naproxen] Rash    HOME MEDICATIONS: Outpatient Medications Prior to Visit  Medication Sig Dispense Refill   acetaminophen  (TYLENOL ) 500 MG tablet Take 2 tablets (1,000 mg total) by mouth every 8 (eight) hours. (Patient taking differently: Take 1,000-1,500 mg by mouth daily as needed for mild pain (pain score 1-3) or moderate pain (pain score 4-6).) 30 tablet 0   amiodarone  (PACERONE ) 200 MG tablet TAKE 1/2 TABLET BY MOUTH DAILY 45 tablet 3   apixaban  (ELIQUIS ) 5 MG TABS tablet Take 1 tablet (5 mg total) by mouth 2 (two) times  daily. 180 tablet 3   atorvastatin  (LIPITOR) 20 MG tablet Take 1 tablet by mouth daily.     clobetasol (TEMOVATE) 0.05 % external solution Apply 1 Application topically daily as needed (psoriasis).     diltiazem  (CARDIZEM  CD) 120 MG 24 hr capsule Take 120 mg by mouth daily.     donepezil  (ARICEPT ) 5 MG tablet Take 5 mg by mouth at bedtime.     DULoxetine  (CYMBALTA ) 60 MG capsule Take 60 mg by mouth in the morning, at noon, and at bedtime.     famotidine  (PEPCID ) 20 MG tablet Take 20 mg by mouth daily.     gabapentin  (NEURONTIN ) 300 MG capsule Take 300 mg by mouth 2 (two) times daily.     ketoconazole (NIZORAL) 2 % cream Apply topically 2 (two) times daily.     lactulose  (CHRONULAC ) 10 GM/15ML solution Take 30 mLs (20 g total) by mouth daily. 240 mL 1   levothyroxine  (SYNTHROID ) 50 MCG tablet Take 50 mcg by mouth daily before breakfast.     MELATONIN PO Take 10 mg by mouth at bedtime.     Multiple Vitamins-Minerals (VISION FORMULA EYE HEALTH) CAPS Take 1 tablet by mouth 2 (two) times daily. [employer] Health     OVER THE COUNTER MEDICATION Take 1 tablet by mouth daily. Pain away     oxyCODONE  (OXYCONTIN ) 10 mg 12 hr tablet Take 10 mg by mouth every 12 (twelve) hours.  oxymetazoline  (AFRIN) 0.05 % nasal spray Place 1 spray into both nostrils at bedtime as needed for congestion (sinuses).     SENEXON-S 8.6-50 MG tablet Take 1 tablet by mouth at bedtime.     No facility-administered medications prior to visit.    PAST MEDICAL HISTORY: Past Medical History:  Diagnosis Date   Arthritis    Chronic back pain    Coronary artery disease    40% mLAD/DIAG bifurcation 03/25/10    Dysrhythmia    History of atrial fibrillation   Gout    H/O hiatal hernia    Hearing decreased    right ear   History of bladder infections    History of bleeding ulcers    History of ulcer disease    Hypercholesteremia    Nocturia    Obstructive sleep apnea hypopnea, severe    Sleep apnea    cpap sleep study 2011     PAST SURGICAL HISTORY: Past Surgical History:  Procedure Laterality Date   BACK SURGERY     CARDIAC CATHETERIZATION     2011   cardioverson     COLONOSCOPY  08/27/2011   Procedure: COLONOSCOPY;  Surgeon: Ruby Corporal, MD;  [facility];  Service: Endoscopy;  Laterality: N/A;  930   ESOPHAGOGASTRODUODENOSCOPY     JOINT REPLACEMENT     left shoulder   Left shoulder replacement     LUMBAR LAMINECTOMY  03/15/2012   Procedure: LUMBAR LAMINECTOMY WITH  X-STOP 2 LEVEL;  Surgeon: Elna Haggis, MD;  Location: [facility];  Service: Neurosurgery;  Laterality: N/A;  Lumbar two-three, three-four XSTOP   TONSILLECTOMY      FAMILY HISTORY: Family History  Problem Relation Age of Onset   Heart failure Mother    Diabetes Mother    Arthritis Mother    High blood pressure Mother    Heart failure Father    Colon cancer Neg Hx    Sleep apnea Neg Hx     SOCIAL HISTORY: Social History   Socioeconomic History   Marital status: Married    Spouse name: Orelia Binet    Number of children: 3   Years of education: college   Highest education level: Not on file  Occupational History    Comment: Retired  Tobacco Use   Smoking status: Former    Current packs/day: 0.00    Average packs/day: 1 pack/day for 25.0 years (25.0 ttl pk-yrs)    Types: Cigarettes    Start date: 07/13/1980    Quit date: 07/13/2005    Years since quitting: 18.2   Smokeless tobacco: Former    Types: Chew   Tobacco comments:    Quit 8 years ago.  Vaping Use   Vaping status: Never Used  Substance and Sexual Activity   Alcohol  use: Yes    Alcohol /week: 2.0 standard drinks of alcohol     Types: 2 Shots of liquor per week    Comment: once a week   Drug use: No   Sexual activity: Not on file  Other Topics Concern   Not on file  Social History Narrative   Patient lives at home with his wife Orelia Binet).    Retired.   Education college.   Right handed.   Caffeine one cup of coffee daily.   Social Drivers  of Corporate investment banker Strain: Not on file  Food Insecurity: No Food Insecurity (07/11/2023)   Hunger Vital Sign    Worried About Running Out of Food in the Last  Year: Never true    Ran Out of Food in the Last Year: Never true  Transportation Needs: No Transportation Needs (07/11/2023)   PRAPARE - Administrator, Civil Service (Medical): No    Lack of Transportation (Non-Medical): No  Physical Activity: Not on file  Stress: Not on file  Social Connections: Unknown (07/11/2023)   Social Connection and Isolation Panel [NHANES]    Frequency of Communication with Friends and Family: More than three times a week    Frequency of Social Gatherings with Friends and Family: More than three times a week    Attends Religious Services: Patient declined    Database administrator or Organizations: No    Attends Banker Meetings: Patient declined    Marital Status: Married  Catering manager Violence: Not At Risk (07/11/2023)   Humiliation, Afraid, Rape, and Kick questionnaire    Fear of Current or Ex-Partner: No    Emotionally Abused: No    Physically Abused: No    Sexually Abused: No      PHYSICAL EXAM Generalized: Well developed, in no acute distress   Neurological examination  Mentation: Alert oriented to time, place, history taking. Follows all commands speech and language fluent Cranial nerve II-XII: Facial symmetry noted  DIAGNOSTIC DATA (LABS, IMAGING, TESTING) - I reviewed patient records, labs, notes, testing and imaging myself where available.  Lab Results  Component Value Date   WBC 25.3 (H) 07/12/2023   HGB 10.8 (L) 07/12/2023   HCT 34.2 (L) 07/12/2023   MCV 98.3 07/12/2023   PLT 286 07/12/2023      Component Value Date/Time   NA 138 07/12/2023 0432   NA 138 06/27/2018 1209   K 4.9 07/12/2023 0432   CL 107 07/12/2023 0432   CO2 23 07/12/2023 0432   GLUCOSE 62 (L) 07/12/2023 0432   BUN 39 (H) 07/12/2023 0432   BUN 19 06/27/2018 1209    CREATININE 1.63 (H) 07/12/2023 0432   CALCIUM  7.9 (L) 07/12/2023 0432   PROT 6.2 (L) 07/12/2023 0432   PROT 6.2 06/27/2018 1209   ALBUMIN 2.0 (L) 07/12/2023 0432   ALBUMIN 4.1 06/27/2018 1209   AST 25 07/12/2023 0432   ALT 27 07/12/2023 0432   ALKPHOS 61 07/12/2023 0432   BILITOT 0.3 07/12/2023 0432   BILITOT 0.3 06/27/2018 1209   GFRNONAA 41 (L) 07/12/2023 0432   GFRAA 77 06/27/2018 1209   Lab Results  Component Value Date   CHOL  03/26/2010    152        ATP III CLASSIFICATION:  <200     mg/dL   Desirable  981-191  mg/dL   Borderline High  >=478    mg/dL   High          HDL 47 03/26/2010   LDLCALC  03/26/2010    80        Total Cholesterol/HDL:CHD Risk Coronary Heart Disease Risk Table                     Men   Women  1/2 Average Risk   3.4   3.3  Average Risk       5.0   4.4  2 X Average Risk   9.6   7.1  3 X Average Risk  23.4   11.0        Use the calculated Patient Ratio above and the CHD Risk Table to determine the patient's CHD Risk.  ATP III CLASSIFICATION (LDL):  <100     mg/dL   Optimal  119-147  mg/dL   Near or Above                    Optimal  130-159  mg/dL   Borderline  829-562  mg/dL   High  >130     mg/dL   Very High   TRIG 865 03/26/2010   CHOLHDL 3.2 03/26/2010   Lab Results  Component Value Date   TSH 6.080 (H) 06/27/2018      ASSESSMENT AND PLAN 83 y.o. year old male  has a past medical history of Arthritis, Chronic back pain, Coronary artery disease, Dysrhythmia, Gout, H/O hiatal hernia, Hearing decreased, History of bladder infections, History of bleeding ulcers, History of ulcer disease, Hypercholesteremia, Nocturia, Obstructive sleep apnea hypopnea, severe, and Sleep apnea. here with:  OSA on CPAP  CPAP compliance excellent Residual AHI is good Encouraged patient to continue using CPAP nightly and > 4 hours each night F/U in 1 year or sooner if needed   Clem Currier, MSN, NP-C 09/20/2023, 3:28 PM [facility] [address] [place], [name] [address] [phone]

## 2023-09-21 ENCOUNTER — Telehealth: Payer: PPO | Admitting: Adult Health

## 2023-09-21 DIAGNOSIS — G4733 Obstructive sleep apnea (adult) (pediatric): Secondary | ICD-10-CM

## 2023-09-21 NOTE — Patient Instructions (Signed)
 Continue using CPAP nightly and greater than 4 hours each night If your symptoms worsen or you develop new symptoms please let us know.

## 2023-10-01 DIAGNOSIS — R350 Frequency of micturition: Secondary | ICD-10-CM | POA: Diagnosis not present

## 2023-10-01 DIAGNOSIS — N3000 Acute cystitis without hematuria: Secondary | ICD-10-CM | POA: Diagnosis not present

## 2023-10-01 DIAGNOSIS — R300 Dysuria: Secondary | ICD-10-CM | POA: Diagnosis not present

## 2023-10-11 ENCOUNTER — Other Ambulatory Visit: Payer: Self-pay | Admitting: Cardiology

## 2023-10-11 DIAGNOSIS — I480 Paroxysmal atrial fibrillation: Secondary | ICD-10-CM

## 2023-11-03 DIAGNOSIS — G4733 Obstructive sleep apnea (adult) (pediatric): Secondary | ICD-10-CM | POA: Diagnosis not present

## 2023-11-18 ENCOUNTER — Telehealth: Payer: Self-pay

## 2023-11-18 NOTE — Telephone Encounter (Signed)
    Primary Cardiologist: Gordy Bergamo, MD  Chart reviewed as part of pre-operative protocol coverage. Simple dental extractions (1-2 teeth) are considered low risk procedures per guidelines and generally do not require any specific cardiac clearance. It is also generally accepted that for simple extractions and dental cleanings, there is no need to interrupt blood thinner therapy.   SBE prophylaxis is not required for the patient.  I will route this recommendation to the requesting party via Epic fax function and remove from pre-op pool.  Please call with questions.  Lum LITTIE Louis, NP 11/18/2023, 12:02 PM

## 2023-11-18 NOTE — Telephone Encounter (Signed)
   Pre-operative Risk Assessment    Patient Name: Carlos Hanson  DOB: Dec 29, 1940 MRN: [id number]   Date of last office visit: 08/09/23 GORDY BERGAMO, MD Date of next office visit: NONE  Request for Surgical Clearance    Procedure:  Dental Extraction - Amount of Teeth to be Pulled:  2 EXTRACTIONS  Date of Surgery:  Clearance TBD                                Surgeon:  ROCKEY CHEEK, DMD Surgeon's Group or Practice Name:  [facility] Phone number:  [phone] Fax number:  [phone]   Type of Clearance Requested:   - Medical  - Pharmacy:  Hold Apixaban  (Eliquis )     Type of Anesthesia:  Not Indicated   Additional requests/questions:    Signed, Lucie DELENA Ku   11/18/2023, 8:50 AM

## 2023-11-19 NOTE — Telephone Encounter (Signed)
 Patient informed that provider's recommendations were sent over to the surgeons office

## 2023-11-19 NOTE — Telephone Encounter (Signed)
 Patient called to follow-up on the status of his clearance.  Patient stated he wants to have dental work done next week and wants to know when he should stop his Eliquis .  Patient stated can also give information to his wife.

## 2023-12-07 DIAGNOSIS — B353 Tinea pedis: Secondary | ICD-10-CM | POA: Diagnosis not present

## 2023-12-07 DIAGNOSIS — L26 Exfoliative dermatitis: Secondary | ICD-10-CM | POA: Diagnosis not present

## 2023-12-10 ENCOUNTER — Telehealth: Payer: Self-pay | Admitting: Cardiology

## 2023-12-10 NOTE — Telephone Encounter (Signed)
 Pt c/o medication issue:  1. Name of Medication: apixaban  (ELIQUIS ) 5 MG TABS tablet   2. How are you currently taking this medication (dosage and times per day)? As written   3. Are you having a reaction (difficulty breathing--STAT)? No   4. What is your medication issue? Pt states this medication is causing him to bruise and bleed easily.

## 2023-12-10 NOTE — Telephone Encounter (Signed)
 Patient has concerns about bleeding on Eliquis . He was switched from xarelto  to Eliquis  back in April 2025. He states that Eliquis  has been worse. His arms are very bruised and he bleeds very easily. He currently has 8 band-aids on his arms covering areas that have been bleeding. He states that the only exercise he is able to do is swimming but he can't do that right now with all the cuts on his arm. He would like to know if there is a different blood thinner that he can try or if he needs to remain on it at all. He states that he only had one incidence of afib while he was hospitalized. Advised patient that he will need to continue Eliquis  for now and I will send a message to Dr. Ladona to advise when he returns next week.

## 2023-12-13 MED ORDER — RIVAROXABAN 15 MG PO TABS: 15.0000 mg | ORAL_TABLET | Freq: Every day | ORAL | 3 refills | Status: DC

## 2023-12-13 NOTE — Telephone Encounter (Signed)
 Ladona Heinz, MD to Chauvigne, Carlyle, RN  Velinda Avelina CROME, RN     12/10/23  6:59 PM He can be switched back to Xarelto  15 mg after supper daily. 90 tab with 3 refills. He can swap or finish eliquis  at hand  Patient notified. He will stop Eliquis  and start Xarelto  15 mg daily after supper.. Prescription sent to [facility] in [place]

## 2023-12-17 DIAGNOSIS — I87333 Chronic venous hypertension (idiopathic) with ulcer and inflammation of bilateral lower extremity: Secondary | ICD-10-CM | POA: Diagnosis not present

## 2023-12-17 DIAGNOSIS — I872 Venous insufficiency (chronic) (peripheral): Secondary | ICD-10-CM | POA: Diagnosis not present

## 2023-12-17 DIAGNOSIS — B353 Tinea pedis: Secondary | ICD-10-CM | POA: Diagnosis not present

## 2023-12-17 DIAGNOSIS — M545 Low back pain, unspecified: Secondary | ICD-10-CM | POA: Diagnosis not present

## 2023-12-17 DIAGNOSIS — Z7901 Long term (current) use of anticoagulants: Secondary | ICD-10-CM | POA: Diagnosis not present

## 2023-12-17 DIAGNOSIS — I480 Paroxysmal atrial fibrillation: Secondary | ICD-10-CM | POA: Diagnosis not present

## 2024-01-04 DIAGNOSIS — M545 Low back pain, unspecified: Secondary | ICD-10-CM | POA: Diagnosis not present

## 2024-01-04 DIAGNOSIS — T148XXA Other injury of unspecified body region, initial encounter: Secondary | ICD-10-CM | POA: Diagnosis not present

## 2024-01-04 DIAGNOSIS — Z7901 Long term (current) use of anticoagulants: Secondary | ICD-10-CM | POA: Diagnosis not present

## 2024-01-04 DIAGNOSIS — I480 Paroxysmal atrial fibrillation: Secondary | ICD-10-CM | POA: Diagnosis not present

## 2024-01-04 DIAGNOSIS — M5416 Radiculopathy, lumbar region: Secondary | ICD-10-CM | POA: Diagnosis not present

## 2024-01-04 DIAGNOSIS — I872 Venous insufficiency (chronic) (peripheral): Secondary | ICD-10-CM | POA: Diagnosis not present

## 2024-01-04 DIAGNOSIS — I87333 Chronic venous hypertension (idiopathic) with ulcer and inflammation of bilateral lower extremity: Secondary | ICD-10-CM | POA: Diagnosis not present

## 2024-01-21 ENCOUNTER — Emergency Department (HOSPITAL_BASED_OUTPATIENT_CLINIC_OR_DEPARTMENT_OTHER)

## 2024-01-21 ENCOUNTER — Inpatient Hospital Stay (HOSPITAL_BASED_OUTPATIENT_CLINIC_OR_DEPARTMENT_OTHER)
Admission: EM | Admit: 2024-01-21 | Discharge: 2024-02-03 | DRG: 286 | Disposition: A | Discharge status: Skilled Nursing Facility | Attending: Internal Medicine | Admitting: Internal Medicine

## 2024-01-21 ENCOUNTER — Other Ambulatory Visit: Payer: Self-pay

## 2024-01-21 DIAGNOSIS — Z8711 Personal history of peptic ulcer disease: Secondary | ICD-10-CM

## 2024-01-21 DIAGNOSIS — Z5320 Procedure and treatment not carried out because of patient's decision for unspecified reasons: Secondary | ICD-10-CM | POA: Diagnosis not present

## 2024-01-21 DIAGNOSIS — Z7901 Long term (current) use of anticoagulants: Secondary | ICD-10-CM

## 2024-01-21 DIAGNOSIS — N3001 Acute cystitis with hematuria: Secondary | ICD-10-CM | POA: Diagnosis not present

## 2024-01-21 DIAGNOSIS — I480 Paroxysmal atrial fibrillation: Secondary | ICD-10-CM | POA: Diagnosis not present

## 2024-01-21 DIAGNOSIS — E039 Hypothyroidism, unspecified: Secondary | ICD-10-CM | POA: Diagnosis present

## 2024-01-21 DIAGNOSIS — I130 Hypertensive heart and chronic kidney disease with heart failure and stage 1 through stage 4 chronic kidney disease, or unspecified chronic kidney disease: Secondary | ICD-10-CM | POA: Diagnosis present

## 2024-01-21 DIAGNOSIS — M545 Low back pain, unspecified: Secondary | ICD-10-CM | POA: Diagnosis present

## 2024-01-21 DIAGNOSIS — R41841 Cognitive communication deficit: Secondary | ICD-10-CM | POA: Diagnosis not present

## 2024-01-21 DIAGNOSIS — N1832 Chronic kidney disease, stage 3b: Secondary | ICD-10-CM | POA: Diagnosis not present

## 2024-01-21 DIAGNOSIS — H9191 Unspecified hearing loss, right ear: Secondary | ICD-10-CM | POA: Diagnosis not present

## 2024-01-21 DIAGNOSIS — M19031 Primary osteoarthritis, right wrist: Secondary | ICD-10-CM | POA: Diagnosis not present

## 2024-01-21 DIAGNOSIS — Z9989 Dependence on other enabling machines and devices: Secondary | ICD-10-CM | POA: Diagnosis not present

## 2024-01-21 DIAGNOSIS — N390 Urinary tract infection, site not specified: Secondary | ICD-10-CM | POA: Diagnosis present

## 2024-01-21 DIAGNOSIS — Z79890 Hormone replacement therapy (postmenopausal): Secondary | ICD-10-CM

## 2024-01-21 DIAGNOSIS — R319 Hematuria, unspecified: Secondary | ICD-10-CM

## 2024-01-21 DIAGNOSIS — I872 Venous insufficiency (chronic) (peripheral): Secondary | ICD-10-CM | POA: Diagnosis not present

## 2024-01-21 DIAGNOSIS — E876 Hypokalemia: Secondary | ICD-10-CM | POA: Diagnosis not present

## 2024-01-21 DIAGNOSIS — I509 Heart failure, unspecified: Secondary | ICD-10-CM | POA: Diagnosis not present

## 2024-01-21 DIAGNOSIS — L89892 Pressure ulcer of other site, stage 2: Secondary | ICD-10-CM | POA: Diagnosis not present

## 2024-01-21 DIAGNOSIS — E871 Hypo-osmolality and hyponatremia: Secondary | ICD-10-CM | POA: Diagnosis present

## 2024-01-21 DIAGNOSIS — Z7401 Bed confinement status: Secondary | ICD-10-CM | POA: Diagnosis not present

## 2024-01-21 DIAGNOSIS — Z87440 Personal history of urinary (tract) infections: Secondary | ICD-10-CM

## 2024-01-21 DIAGNOSIS — Z8261 Family history of arthritis: Secondary | ICD-10-CM

## 2024-01-21 DIAGNOSIS — E785 Hyperlipidemia, unspecified: Secondary | ICD-10-CM | POA: Diagnosis not present

## 2024-01-21 DIAGNOSIS — I5033 Acute on chronic diastolic (congestive) heart failure: Secondary | ICD-10-CM | POA: Diagnosis not present

## 2024-01-21 DIAGNOSIS — N179 Acute kidney failure, unspecified: Secondary | ICD-10-CM | POA: Diagnosis not present

## 2024-01-21 DIAGNOSIS — Z8249 Family history of ischemic heart disease and other diseases of the circulatory system: Secondary | ICD-10-CM

## 2024-01-21 DIAGNOSIS — G2581 Restless legs syndrome: Secondary | ICD-10-CM | POA: Diagnosis not present

## 2024-01-21 DIAGNOSIS — K219 Gastro-esophageal reflux disease without esophagitis: Secondary | ICD-10-CM | POA: Diagnosis present

## 2024-01-21 DIAGNOSIS — D631 Anemia in chronic kidney disease: Secondary | ICD-10-CM | POA: Diagnosis present

## 2024-01-21 DIAGNOSIS — Z96612 Presence of left artificial shoulder joint: Secondary | ICD-10-CM | POA: Diagnosis present

## 2024-01-21 DIAGNOSIS — F0390 Unspecified dementia without behavioral disturbance: Secondary | ICD-10-CM | POA: Diagnosis not present

## 2024-01-21 DIAGNOSIS — Z87891 Personal history of nicotine dependence: Secondary | ICD-10-CM

## 2024-01-21 DIAGNOSIS — L8990 Pressure ulcer of unspecified site, unspecified stage: Secondary | ICD-10-CM | POA: Diagnosis present

## 2024-01-21 DIAGNOSIS — F03A3 Unspecified dementia, mild, with mood disturbance: Secondary | ICD-10-CM | POA: Diagnosis not present

## 2024-01-21 DIAGNOSIS — G9341 Metabolic encephalopathy: Secondary | ICD-10-CM | POA: Diagnosis not present

## 2024-01-21 DIAGNOSIS — M25531 Pain in right wrist: Secondary | ICD-10-CM | POA: Diagnosis present

## 2024-01-21 DIAGNOSIS — I4819 Other persistent atrial fibrillation: Secondary | ICD-10-CM | POA: Diagnosis not present

## 2024-01-21 DIAGNOSIS — I4821 Permanent atrial fibrillation: Principal | ICD-10-CM | POA: Diagnosis present

## 2024-01-21 DIAGNOSIS — I2510 Atherosclerotic heart disease of native coronary artery without angina pectoris: Secondary | ICD-10-CM | POA: Diagnosis not present

## 2024-01-21 DIAGNOSIS — Z91199 Patient's noncompliance with other medical treatment and regimen due to unspecified reason: Secondary | ICD-10-CM

## 2024-01-21 DIAGNOSIS — Z79899 Other long term (current) drug therapy: Secondary | ICD-10-CM

## 2024-01-21 DIAGNOSIS — N1831 Chronic kidney disease, stage 3a: Secondary | ICD-10-CM | POA: Diagnosis not present

## 2024-01-21 DIAGNOSIS — Z9181 History of falling: Secondary | ICD-10-CM | POA: Diagnosis not present

## 2024-01-21 DIAGNOSIS — I10 Essential (primary) hypertension: Secondary | ICD-10-CM | POA: Diagnosis not present

## 2024-01-21 DIAGNOSIS — I4891 Unspecified atrial fibrillation: Secondary | ICD-10-CM | POA: Diagnosis not present

## 2024-01-21 DIAGNOSIS — F32A Depression, unspecified: Secondary | ICD-10-CM | POA: Diagnosis present

## 2024-01-21 DIAGNOSIS — R2681 Unsteadiness on feet: Secondary | ICD-10-CM | POA: Diagnosis not present

## 2024-01-21 DIAGNOSIS — M7989 Other specified soft tissue disorders: Secondary | ICD-10-CM | POA: Diagnosis not present

## 2024-01-21 DIAGNOSIS — I4510 Unspecified right bundle-branch block: Secondary | ICD-10-CM | POA: Diagnosis present

## 2024-01-21 DIAGNOSIS — R1310 Dysphagia, unspecified: Secondary | ICD-10-CM | POA: Diagnosis not present

## 2024-01-21 DIAGNOSIS — Z6841 Body Mass Index (BMI) 40.0 and over, adult: Secondary | ICD-10-CM

## 2024-01-21 DIAGNOSIS — I3139 Other pericardial effusion (noninflammatory): Secondary | ICD-10-CM | POA: Diagnosis present

## 2024-01-21 DIAGNOSIS — R600 Localized edema: Secondary | ICD-10-CM | POA: Insufficient documentation

## 2024-01-21 DIAGNOSIS — E7800 Pure hypercholesterolemia, unspecified: Secondary | ICD-10-CM | POA: Diagnosis present

## 2024-01-21 DIAGNOSIS — G4733 Obstructive sleep apnea (adult) (pediatric): Secondary | ICD-10-CM | POA: Diagnosis present

## 2024-01-21 DIAGNOSIS — F1099 Alcohol use, unspecified with unspecified alcohol-induced disorder: Secondary | ICD-10-CM | POA: Diagnosis not present

## 2024-01-21 DIAGNOSIS — L89312 Pressure ulcer of right buttock, stage 2: Secondary | ICD-10-CM | POA: Diagnosis not present

## 2024-01-21 DIAGNOSIS — B961 Klebsiella pneumoniae [K. pneumoniae] as the cause of diseases classified elsewhere: Secondary | ICD-10-CM | POA: Diagnosis not present

## 2024-01-21 DIAGNOSIS — D6832 Hemorrhagic disorder due to extrinsic circulating anticoagulants: Secondary | ICD-10-CM | POA: Diagnosis not present

## 2024-01-21 DIAGNOSIS — I951 Orthostatic hypotension: Secondary | ICD-10-CM | POA: Diagnosis not present

## 2024-01-21 DIAGNOSIS — K5909 Other constipation: Secondary | ICD-10-CM | POA: Diagnosis present

## 2024-01-21 DIAGNOSIS — E66813 Obesity, class 3: Secondary | ICD-10-CM | POA: Diagnosis not present

## 2024-01-21 DIAGNOSIS — M1990 Unspecified osteoarthritis, unspecified site: Secondary | ICD-10-CM | POA: Diagnosis not present

## 2024-01-21 DIAGNOSIS — R918 Other nonspecific abnormal finding of lung field: Secondary | ICD-10-CM | POA: Diagnosis not present

## 2024-01-21 DIAGNOSIS — D6869 Other thrombophilia: Secondary | ICD-10-CM | POA: Diagnosis present

## 2024-01-21 DIAGNOSIS — Z452 Encounter for adjustment and management of vascular access device: Secondary | ICD-10-CM | POA: Diagnosis not present

## 2024-01-21 DIAGNOSIS — R278 Other lack of coordination: Secondary | ICD-10-CM | POA: Diagnosis not present

## 2024-01-21 DIAGNOSIS — R531 Weakness: Secondary | ICD-10-CM | POA: Diagnosis not present

## 2024-01-21 DIAGNOSIS — G8929 Other chronic pain: Secondary | ICD-10-CM | POA: Diagnosis present

## 2024-01-21 DIAGNOSIS — R509 Fever, unspecified: Secondary | ICD-10-CM | POA: Diagnosis not present

## 2024-01-21 DIAGNOSIS — I878 Other specified disorders of veins: Secondary | ICD-10-CM | POA: Diagnosis present

## 2024-01-21 DIAGNOSIS — R001 Bradycardia, unspecified: Secondary | ICD-10-CM | POA: Diagnosis present

## 2024-01-21 DIAGNOSIS — I493 Ventricular premature depolarization: Secondary | ICD-10-CM | POA: Diagnosis present

## 2024-01-21 DIAGNOSIS — Z833 Family history of diabetes mellitus: Secondary | ICD-10-CM

## 2024-01-21 DIAGNOSIS — I959 Hypotension, unspecified: Secondary | ICD-10-CM | POA: Diagnosis present

## 2024-01-21 DIAGNOSIS — Z886 Allergy status to analgesic agent status: Secondary | ICD-10-CM

## 2024-01-21 DIAGNOSIS — M6281 Muscle weakness (generalized): Secondary | ICD-10-CM | POA: Diagnosis not present

## 2024-01-21 DIAGNOSIS — R0989 Other specified symptoms and signs involving the circulatory and respiratory systems: Secondary | ICD-10-CM | POA: Diagnosis not present

## 2024-01-21 LAB — BASIC METABOLIC PANEL WITH GFR
Anion gap: 12 (ref 5–15)
BUN: 36 mg/dL — ABNORMAL HIGH (ref 8–23)
CO2: 25 mmol/L (ref 22–32)
Calcium: 9.8 mg/dL (ref 8.9–10.3)
Chloride: 105 mmol/L (ref 98–111)
Creatinine, Ser: 1.57 mg/dL — ABNORMAL HIGH (ref 0.61–1.24)
GFR, Estimated: 43 mL/min — ABNORMAL LOW (ref >60)
Glucose, Bld: 118 mg/dL — ABNORMAL HIGH (ref 70–99)
Potassium: 5.1 mmol/L (ref 3.5–5.1)
Sodium: 142 mmol/L (ref 135–145)

## 2024-01-21 LAB — CBC
HCT: 34.3 % — ABNORMAL LOW (ref 39.0–52.0)
Hemoglobin: 11 g/dL — ABNORMAL LOW (ref 13.0–17.0)
MCH: 31.1 pg (ref 26.0–34.0)
MCHC: 32.1 g/dL (ref 30.0–36.0)
MCV: 96.9 fL (ref 80.0–100.0)
Platelets: 355 K/uL (ref 150–400)
RBC: 3.54 MIL/uL — ABNORMAL LOW (ref 4.22–5.81)
RDW: 15.9 % — ABNORMAL HIGH (ref 11.5–15.5)
WBC: 12.5 K/uL — ABNORMAL HIGH (ref 4.0–10.5)
nRBC: 0 % (ref 0.0–0.2)

## 2024-01-21 LAB — URINALYSIS, ROUTINE W REFLEX MICROSCOPIC
Bilirubin Urine: NEGATIVE
Glucose, UA: NEGATIVE mg/dL
Hgb urine dipstick: NEGATIVE
Ketones, ur: NEGATIVE mg/dL
Nitrite: NEGATIVE
Protein, ur: NEGATIVE mg/dL
Specific Gravity, Urine: 1.01 (ref 1.005–1.030)
pH: 5 (ref 5.0–8.0)

## 2024-01-21 LAB — LACTIC ACID, PLASMA: Lactic Acid, Venous: 0.9 mmol/L (ref 0.5–1.9)

## 2024-01-21 MED ORDER — SODIUM CHLORIDE 0.9 % IV SOLN
1.0000 g | INTRAVENOUS | Status: DC
  Administered 2024-01-22 – 2024-01-23 (×2): 1 g via INTRAVENOUS
  Filled 2024-01-21 (×2): qty 10

## 2024-01-21 MED ORDER — AMIODARONE HCL 200 MG PO TABS
200.0000 mg | ORAL_TABLET | Freq: Every day | ORAL | Status: DC
  Administered 2024-01-21: 200 mg via ORAL
  Filled 2024-01-21: qty 1

## 2024-01-21 MED ORDER — SENNOSIDES-DOCUSATE SODIUM 8.6-50 MG PO TABS
1.0000 | ORAL_TABLET | Freq: Every day | ORAL | Status: DC
  Administered 2024-01-23 – 2024-02-02 (×11): 1 via ORAL
  Filled 2024-01-21 (×12): qty 1

## 2024-01-21 MED ORDER — MELATONIN 5 MG PO TABS
10.0000 mg | ORAL_TABLET | Freq: Every day | ORAL | Status: DC
  Administered 2024-01-21 – 2024-02-02 (×13): 10 mg via ORAL
  Filled 2024-01-21 (×13): qty 2

## 2024-01-21 MED ORDER — DONEPEZIL HCL 5 MG PO TABS
5.0000 mg | ORAL_TABLET | Freq: Every day | ORAL | Status: DC
  Administered 2024-01-21 – 2024-02-02 (×13): 5 mg via ORAL
  Filled 2024-01-21 (×13): qty 1

## 2024-01-21 MED ORDER — LEVOTHYROXINE SODIUM 50 MCG PO TABS
50.0000 ug | ORAL_TABLET | Freq: Every day | ORAL | Status: DC
  Administered 2024-01-22 – 2024-02-03 (×12): 50 ug via ORAL
  Filled 2024-01-21 (×13): qty 1

## 2024-01-21 MED ORDER — DILTIAZEM LOAD VIA INFUSION
5.0000 mg | Freq: Once | INTRAVENOUS | Status: AC
  Administered 2024-01-21: 5 mg via INTRAVENOUS
  Filled 2024-01-21: qty 5

## 2024-01-21 MED ORDER — DILTIAZEM HCL-DEXTROSE 125-5 MG/125ML-% IV SOLN (PREMIX)
5.0000 mg/h | INTRAVENOUS | Status: DC
  Administered 2024-01-21: 10 mg/h via INTRAVENOUS
  Administered 2024-01-21: 5 mg/h via INTRAVENOUS
  Filled 2024-01-21: qty 125

## 2024-01-21 MED ORDER — PROSIGHT PO TABS
1.0000 | ORAL_TABLET | Freq: Every day | ORAL | Status: DC
Start: 2024-01-22 — End: 2024-02-03
  Administered 2024-01-22 – 2024-02-03 (×12): 1 via ORAL
  Filled 2024-01-21 (×13): qty 1

## 2024-01-21 MED ORDER — GABAPENTIN 300 MG PO CAPS
600.0000 mg | ORAL_CAPSULE | Freq: Three times a day (TID) | ORAL | Status: DC
Start: 2024-01-21 — End: 2024-01-30
  Administered 2024-01-21 – 2024-01-30 (×26): 600 mg via ORAL
  Filled 2024-01-21 (×26): qty 2

## 2024-01-21 MED ORDER — ACETAMINOPHEN 325 MG PO TABS
650.0000 mg | ORAL_TABLET | ORAL | Status: DC | PRN
  Administered 2024-01-21 – 2024-01-28 (×4): 650 mg via ORAL
  Filled 2024-01-21 (×4): qty 2

## 2024-01-21 MED ORDER — AMIODARONE HCL IN DEXTROSE 360-4.14 MG/200ML-% IV SOLN
30.0000 mg/h | INTRAVENOUS | Status: DC
Start: 2024-01-21 — End: 2024-02-02
  Administered 2024-01-21 – 2024-01-24 (×7): 30 mg/h via INTRAVENOUS
  Administered 2024-01-25 – 2024-02-01 (×24): 60 mg/h via INTRAVENOUS
  Administered 2024-02-01: 30 mg/h via INTRAVENOUS
  Administered 2024-02-01: 60 mg/h via INTRAVENOUS
  Administered 2024-02-02: 30 mg/h via INTRAVENOUS
  Filled 2024-01-21 (×32): qty 200

## 2024-01-21 MED ORDER — ONDANSETRON HCL 4 MG/2ML IJ SOLN: 4.0000 mg | Freq: Four times a day (QID) | INTRAMUSCULAR | Status: DC | PRN

## 2024-01-21 MED ORDER — SODIUM CHLORIDE 0.9 % IV SOLN
1.0000 g | Freq: Once | INTRAVENOUS | Status: AC
  Administered 2024-01-21: 1 g via INTRAVENOUS
  Filled 2024-01-21: qty 10

## 2024-01-21 MED ORDER — FAMOTIDINE 20 MG PO TABS
20.0000 mg | ORAL_TABLET | Freq: Every day | ORAL | Status: DC
  Administered 2024-01-22 – 2024-02-03 (×12): 20 mg via ORAL
  Filled 2024-01-21 (×12): qty 1

## 2024-01-21 MED ORDER — RIVAROXABAN 15 MG PO TABS
15.0000 mg | ORAL_TABLET | Freq: Every day | ORAL | Status: DC
Start: 2024-01-21 — End: 2024-01-25
  Administered 2024-01-21 – 2024-01-24 (×4): 15 mg via ORAL
  Filled 2024-01-21 (×5): qty 1

## 2024-01-21 MED ORDER — DULOXETINE HCL 60 MG PO CPEP
60.0000 mg | ORAL_CAPSULE | Freq: Two times a day (BID) | ORAL | Status: DC
  Administered 2024-01-21 – 2024-02-03 (×25): 60 mg via ORAL
  Filled 2024-01-21 (×25): qty 1

## 2024-01-21 MED ORDER — ATORVASTATIN CALCIUM 10 MG PO TABS
20.0000 mg | ORAL_TABLET | Freq: Every day | ORAL | Status: DC
Start: 2024-01-22 — End: 2024-02-03
  Administered 2024-01-22 – 2024-02-03 (×12): 20 mg via ORAL
  Filled 2024-01-21 (×12): qty 2

## 2024-01-21 MED ORDER — LACTULOSE 10 GM/15ML PO SOLN
20.0000 g | Freq: Every day | ORAL | Status: DC
  Administered 2024-01-23 – 2024-02-03 (×9): 20 g via ORAL
  Filled 2024-01-21 (×9): qty 30

## 2024-01-21 MED ORDER — FUROSEMIDE 10 MG/ML IJ SOLN
40.0000 mg | Freq: Once | INTRAMUSCULAR | Status: AC
  Administered 2024-01-21: 40 mg via INTRAVENOUS
  Filled 2024-01-21: qty 4

## 2024-01-21 MED ORDER — FUROSEMIDE 10 MG/ML IJ SOLN: 20.0000 mg | Freq: Once | INTRAMUSCULAR | Status: DC

## 2024-01-21 MED ORDER — AMIODARONE HCL IN DEXTROSE 360-4.14 MG/200ML-% IV SOLN
60.0000 mg/h | INTRAVENOUS | Status: AC
  Administered 2024-01-21: 60 mg/h via INTRAVENOUS
  Filled 2024-01-21 (×2): qty 200

## 2024-01-21 NOTE — ED Notes (Signed)
 Patient notified of need for urine sample to complete eval/assessment. Specimen cup provided and instructions for clean catch given. Patient will notify staff when able to provide

## 2024-01-21 NOTE — Progress Notes (Signed)
   01/21/24 2348  BiPAP/CPAP/SIPAP  $ Non-Invasive Home Ventilator  Initial  $ Face Mask Medium Yes  BiPAP/CPAP/SIPAP Pt Type Adult  BiPAP/CPAP/SIPAP DREAMSTATIOND  Mask Type Full face mask  Dentures removed? Not applicable  Mask Size Medium  Respiratory Rate 18 breaths/min  PEEP 7 cmH20  FiO2 (%) 21 %  Patient Home Machine No  Patient Home Mask No  Patient Home Tubing No  Auto Titrate No  Device Plugged into [employer] Power Outlet Yes

## 2024-01-21 NOTE — ED Provider Notes (Signed)
  EMERGENCY DEPARTMENT AT [facility]: [facility] Provider Note   CSN: [id number] Arrival date & time: 01/21/24  1148     Patient presents with: Wound Check   Carlos Hanson is a 83 y.o. male.   HPI 83 year old male history of chronic venous insufficiency and chronic wounds presents today with increased swelling to the left leg.  He reports that he has noted this for about 3 weeks.  He was seen by his primary care provider and told to continue elevation, compression, and movement.  He has noted increased weeping.  Pain is worse in the left lower leg.  He also has noted increased swelling in the left versus the right.  No chest pain or dyspnea noted    Prior to Admission medications   Medication Sig Start Date End Date Taking? Authorizing Provider  acetaminophen  (TYLENOL ) 500 MG tablet Take 2 tablets (1,000 mg total) by mouth every 8 (eight) hours. Patient taking differently: Take 1,000-1,500 mg by mouth daily as needed for mild pain (pain score 1-3) or moderate pain (pain score 4-6). 03/13/18   Samtani, Jai-Gurmukh, MD  amiodarone  (PACERONE ) 200 MG tablet Take 0.5 tablets (100 mg total) by mouth daily. 10/12/23   Ladona Heinz, MD  atorvastatin  (LIPITOR) 20 MG tablet Take 1 tablet by mouth daily. 07/22/18   [provider]  clobetasol (TEMOVATE) 0.05 % external solution Apply 1 Application topically daily as needed (psoriasis). 05/24/23   [provider]  diltiazem  (CARDIZEM  CD) 120 MG 24 hr capsule Take 120 mg by mouth daily.    [provider]  donepezil  (ARICEPT ) 5 MG tablet Take 5 mg by mouth at bedtime. 04/23/23   [provider]  DULoxetine  (CYMBALTA ) 60 MG capsule Take 60 mg by mouth in the morning, at noon, and at bedtime.    [provider]  famotidine  (PEPCID ) 20 MG tablet Take 20 mg by mouth daily. 04/04/21   [provider]  gabapentin  (NEURONTIN ) 300 MG capsule Take 300 mg by mouth 2 (two) times daily.    [provider]  ketoconazole (NIZORAL) 2 % cream Apply topically 2 (two) times daily. 06/29/23   [provider]  lactulose  (CHRONULAC ) 10 GM/15ML solution Take 30 mLs (20 g total) by mouth daily. 03/13/18   Samtani, Jai-Gurmukh, MD  levothyroxine  (SYNTHROID ) 50 MCG tablet Take 50 mcg by mouth daily before breakfast.    [provider]  MELATONIN PO Take 10 mg by mouth at bedtime.    [provider]  Multiple Vitamins-Minerals (VISION FORMULA EYE HEALTH) CAPS Take 1 tablet by mouth 2 (two) times daily. [employer]    [provider]  OVER THE COUNTER MEDICATION Take 1 tablet by mouth daily. Pain away    [provider]  oxyCODONE  (OXYCONTIN ) 10 mg 12 hr tablet Take 10 mg by mouth every 12 (twelve) hours.    [provider]  oxymetazoline  (AFRIN) 0.05 % nasal spray Place 1 spray into both nostrils at bedtime as needed for congestion (sinuses).    [provider]  Rivaroxaban  (XARELTO ) 15 MG TABS tablet Take 1 tablet (15 mg total) by mouth daily with supper. 12/13/23   Ladona Heinz, MD  SENEXON-S 8.6-50 MG tablet Take 1 tablet by mouth at bedtime.    [provider]    Allergies: Celecoxib, Penicillin g sodium, and Naprosyn [naproxen]    Review of Systems  Updated Vital Signs BP 92/75   Pulse (!) 54   Temp 98.4 F (36.9 C) (  Oral)   Resp 14   SpO2 95%   Physical Exam Vitals and nursing note reviewed.  Constitutional:      Appearance: Normal appearance.  HENT:     Head: Normocephalic.     Right Ear: External ear normal.     Left Ear: External ear normal.     Nose: Nose normal.     Mouth/Throat:     Mouth: Mucous membranes are moist.  Eyes:     Pupils: Pupils are equal, round, and reactive to light.  Cardiovascular:     Rate and Rhythm: Tachycardia present. Rhythm irregular.     Pulses: Normal pulses.  Pulmonary:     Effort: Pulmonary effort is normal.     Breath sounds: Normal breath sounds.  Abdominal:      General: Abdomen is flat.     Palpations: Abdomen is soft.  Musculoskeletal:     Cervical back: Normal range of motion.     Comments: Bilateral lower extremity edema noted with left worse than right Pulses are intact Weeping edema noted on left  Skin:    General: Skin is warm and dry.     Capillary Refill: Capillary refill takes less than 2 seconds.  Neurological:     General: No focal deficit present.     Mental Status: He is alert.  Psychiatric:        Mood and Affect: Mood normal.     (all labs ordered are listed, but only abnormal results are displayed) Labs Reviewed  CBC - Abnormal; Notable for the following components:      Result Value   WBC 12.5 (*)    RBC 3.54 (*)    Hemoglobin 11.0 (*)    HCT 34.3 (*)    RDW 15.9 (*)    All other components within normal limits  BASIC METABOLIC PANEL WITH GFR - Abnormal; Notable for the following components:   Glucose, Bld 118 (*)    BUN 36 (*)    Creatinine, Ser 1.57 (*)    GFR, Estimated 43 (*)    All other components within normal limits  URINALYSIS, ROUTINE W REFLEX MICROSCOPIC - Abnormal; Notable for the following components:   Leukocytes,Ua MODERATE (*)    Bacteria, UA MANY (*)    All other components within normal limits  URINE CULTURE  LACTIC ACID, PLASMA  LACTIC ACID, PLASMA    EKG: EKG Interpretation Date/Time:  Friday January 21 2024 15:16:36 EDT Ventricular Rate:  142 PR Interval:  160 QRS Duration:  134 QT Interval:  343 QTC Calculation: 528 R Axis:   262  Text Interpretation: afib rvr Right bundle branch block Confirmed by Levander Houston [phone]) on 01/21/2024 3:40:36 PM  Radiology: US  Venous Img Lower  Left (DVT Study) Result Date: 01/21/2024 EXAM: ULTRASOUND DUPLEX OF THE LEFT LOWER EXTREMITY VEINS TECHNIQUE: Duplex ultrasound using B-mode/gray scaled imaging and Doppler spectral analysis and color flow was obtained of the deep venous structures of the left lower extremity. COMPARISON: None. CLINICAL  HISTORY: LLE swelling. FINDINGS: The common femoral vein, femoral vein, popliteal vein, and posterior tibial vein of the left lower extremity demonstrate normal compressibility with normal color flow and spectral analysis. Limited images of the contralateral right common femoral vein are negative. There is subcutaneous edema in the left calf. IMPRESSION: 1. No evidence of deep venous thrombosis in the left lower extremity. 2. Subcutaneous edema in the left calf. Electronically signed by: Dayne Hassell MD 01/21/2024 01:08 PM EDT RP Workstation: [id number]     .  Critical Care  Performed by: Levander Houston, MD Authorized by: Levander Houston, MD   Critical care provider statement:    Critical care time (minutes):  60   Critical care time was exclusive of:  Separately billable procedures and treating other patients and teaching time   Critical care was time spent personally by me on the following activities:  Development of treatment plan with patient or surrogate, discussions with consultants, evaluation of patient's response to treatment, examination of patient, ordering and review of laboratory studies, ordering and review of radiographic studies, ordering and performing treatments and interventions, pulse oximetry, re-evaluation of patient's condition and review of old charts    Medications Ordered in the ED  amiodarone  (PACERONE ) tablet 200 mg (200 mg Oral Given 01/21/24 1429)  cefTRIAXone  (ROCEPHIN ) 1 g in sodium chloride  0.9 % 100 mL IVPB (has no administration in time range)  amiodarone  (NEXTERONE  PREMIX) 360-4.14 MG/200ML-% (1.8 mg/mL) IV infusion (has no administration in time range)  amiodarone  (NEXTERONE  PREMIX) 360-4.14 MG/200ML-% (1.8 mg/mL) IV infusion (has no administration in time range)  diltiazem  (CARDIZEM ) 1 mg/mL load via infusion 5 mg (5 mg Intravenous Bolus from Bag 01/21/24 1601)    Clinical Course as of 01/21/24 1700  Fri Jan 21, 2024  1356 Doppler reviewed and no evidence of  acute DVT in left lower extremity [DR]  1415 Basic metabolic panel reviewed interpreted significant for stable BUN and creatinine although elevated at 36 and 1.57 CBC reviewed and interpreted significant for improved leukocytosis from prior with stable hemoglobin [DR]    Clinical Course User Index [DR] Levander Houston, MD                                 Medical Decision Making Amount and/or Complexity of Data Reviewed Labs: ordered.  Risk Prescription drug management.   1 A-fib RVR patient reports taking his medications.  He was given an additional dose of his p.o. amiodarone .  Continues to have rapid rate and IV Cardizem  started.  Currently rate heart rate is down from 150s to 120s.  Blood pressure remains low 98/62 Patient has been in nsr with one prior episode of afib He is on amiodarone  and on anticoagulation. 2 lower extremity edema asymmetric with left greater than right.  Patient has known history of peripheral edema.  However with the asymmetry ultrasound was obtained that did not show any Doppler 3 patient has urinary frequency.  He has had ongoing urinary frequency here in the ED.  Urinalysis is obtained and shows moderate leukocytes with many bacteria present RBC 0-5 white blood cells 11-20.  Patient is given Rocephin  and urine is cultured Will consult hospitalist for admission for ongoing evaluation and treatment Care discussed with Dr. Lenor who has assumed care at this time and will recheck after chest x-Lavonia Eager, amiodarone  infusion, and hospitalist and cardiologist consultation    Final diagnoses:  Atrial fibrillation with rapid ventricular response (HCC)  Urinary tract infection with hematuria, site unspecified  Bilateral lower extremity edema    ED Discharge Orders     None          Levander Houston, MD 01/21/24 1700

## 2024-01-21 NOTE — H&P (Signed)
 History and Physical  Carlos Hanson:[id number] DOB: [birth year] DOA: 01/21/2024  PCP: Yolande Toribio [name], MD   Chief Complaint: Worsening left leg swelling  HPI: Carlos Hanson is a 83 y.o. male with medical history significant for paroxysmal A-fib on Xarelto , osteoarthritis, venous stasis insufficiency, chronic back pain, CAD, GERD, hiatal hernia, gout, history of bleeding PUD, hyperlipidemia, obstructive sleep apnea on CPAP, class II obesity and chronic constipation who presented to [facility] for evaluation of worsening leg swelling.  Patient reports he has chronic leg swelling however over the last 3 weeks, he has had worsening leg swelling, left greater than right.  He was evaluated by his PCP 2 days ago and was prescribed Lasix  and advised to continue elevation and compression.  He presented to the ED for further evaluation of his worsening left leg swelling.  He endorsed frequent urination but denies any shortness of breath, chest pain, fever, chills, abdominal pain, nausea, vomiting, dizziness or dysuria.  ED Course: Initial vitals show temp 98.1, RR 16-20s, HR initially in the 50s to 70s, SBP 110-130s, SpO2 96% on room air. Initial labs significant for BUN/creatinine 36/1.57, WBC 12.5 (from 25.36 months ago), Hgb 11.0, lactic acid 0.9, UA shows negative nitrite, moderate leuks, WBC 11-20 and many bacteria.  During evaluation in the ER, patient into A-fib with RVR.  He received a dose of his home amiodarone  and started on IV Cardizem .  Due to soft BP, Cardizem  was switched to IV amiodarone  and cardiology was consulted for evaluation.  EKG shows A-fib with RVR with rate of 142 and RBBB. CXR shows no active disease. U/S LLE negative for DVT.  Pt received IV Rocephin . Patient was admitted to [facility] service and transferred to [facility].  Review of Systems: Please see HPI for pertinent positives and negatives. A complete 10 system review of systems are otherwise negative.  Past Medical History:   Diagnosis Date   Arthritis    Chronic back pain    Coronary artery disease    40% mLAD/DIAG bifurcation 03/25/10    Dysrhythmia    History of atrial fibrillation   Gout    H/O hiatal hernia    Hearing decreased    right ear   History of bladder infections    History of bleeding ulcers    History of ulcer disease    Hypercholesteremia    Nocturia    Obstructive sleep apnea hypopnea, severe    Sleep apnea    cpap sleep study 2011   Past Surgical History:  Procedure Laterality Date   BACK SURGERY     CARDIAC CATHETERIZATION     2011   cardioverson     COLONOSCOPY  08/27/2011   Procedure: COLONOSCOPY;  Surgeon: Claudis RAYMOND Rivet, MD;  Location: [facility];  Service: Endoscopy;  Laterality: N/A;  930   ESOPHAGOGASTRODUODENOSCOPY     JOINT REPLACEMENT     left shoulder   Left shoulder replacement     LUMBAR LAMINECTOMY  03/15/2012   Procedure: LUMBAR LAMINECTOMY WITH  X-STOP 2 LEVEL;  Surgeon: Victory Gens, MD;  Location: [facility] NEURO ORS;  Service: Neurosurgery;  Laterality: N/A;  Lumbar two-three, three-four XSTOP   TONSILLECTOMY     Social History:  reports that he quit smoking about 18 years ago. His smoking use included cigarettes. He started smoking about 43 years ago. He has a 25 pack-year smoking history. He has quit using smokeless tobacco.  His smokeless tobacco use included chew. He reports current alcohol  use  of about 2.0 standard drinks of alcohol  per week. He reports that he does not use drugs.  Allergies  Allergen Reactions   Celecoxib Other (See Comments)    Ineffective   Penicillin G Sodium Nausea Only   Naprosyn [Naproxen] Rash    Family History  Problem Relation Age of Onset   Heart failure Mother    Diabetes Mother    Arthritis Mother    High blood pressure Mother    Heart failure Father    Colon cancer Neg Hx    Sleep apnea Neg Hx      Prior to Admission medications   Medication Sig Start Date End Date Taking? Authorizing Provider  acetaminophen   (TYLENOL ) 500 MG tablet Take 2 tablets (1,000 mg total) by mouth every 8 (eight) hours. Patient taking differently: Take 1,000-1,500 mg by mouth daily as needed for mild pain (pain score 1-3) or moderate pain (pain score 4-6). 03/13/18   Samtani, Jai-Gurmukh, MD  amiodarone  (PACERONE ) 200 MG tablet Take 0.5 tablets (100 mg total) by mouth daily. 10/12/23   Ladona Heinz, MD  atorvastatin  (LIPITOR) 20 MG tablet Take 1 tablet by mouth daily. 07/22/18   [provider]  clobetasol (TEMOVATE) 0.05 % external solution Apply 1 Application topically daily as needed (psoriasis). 05/24/23   [provider]  diltiazem  (CARDIZEM  CD) 120 MG 24 hr capsule Take 120 mg by mouth daily.    [provider]  donepezil  (ARICEPT ) 5 MG tablet Take 5 mg by mouth at bedtime. 04/23/23   [provider]  DULoxetine  (CYMBALTA ) 60 MG capsule Take 60 mg by mouth in the morning, at noon, and at bedtime.    [provider]  famotidine  (PEPCID ) 20 MG tablet Take 20 mg by mouth daily. 04/04/21   [provider]  gabapentin  (NEURONTIN ) 300 MG capsule Take 300 mg by mouth 2 (two) times daily.    [provider]  ketoconazole (NIZORAL) 2 % cream Apply topically 2 (two) times daily. 06/29/23   [provider]  lactulose  (CHRONULAC ) 10 GM/15ML solution Take 30 mLs (20 g total) by mouth daily. 03/13/18   Samtani, Jai-Gurmukh, MD  levothyroxine  (SYNTHROID ) 50 MCG tablet Take 50 mcg by mouth daily before breakfast.    [provider]  MELATONIN PO Take 10 mg by mouth at bedtime.    [provider]  Multiple Vitamins-Minerals (VISION FORMULA EYE HEALTH) CAPS Take 1 tablet by mouth 2 (two) times daily. [employer]    [provider]  OVER THE COUNTER MEDICATION Take 1 tablet by mouth daily. Pain away    [provider]  oxyCODONE  (OXYCONTIN ) 10 mg 12 hr tablet Take 10 mg by mouth every 12 (twelve) hours.    [provider]   oxymetazoline  (AFRIN) 0.05 % nasal spray Place 1 spray into both nostrils at bedtime as needed for congestion (sinuses).    [provider]  Rivaroxaban  (XARELTO ) 15 MG TABS tablet Take 1 tablet (15 mg total) by mouth daily with supper. 12/13/23   Ladona Heinz, MD  SENEXON-S 8.6-50 MG tablet Take 1 tablet by mouth at bedtime.    [provider]    Physical Exam: BP (!) 116/91 (BP Location: Right Wrist)   Pulse (!) 110   Temp 97.6 F (36.4 C) (Oral)   Resp 18   SpO2 97%  General: Pleasant, well-appearing obese elderly man laying in bed. No acute distress. HEENT: [place]/AT. Anicteric sclera CV: Tachycardic. Irregularly irregular rhythm. No murmurs, rubs, or gallops.  Pulmonary: Lungs CTAB. Normal effort. No wheezing or rales. Decreased breath sounds Abdominal: Soft, nontender, nondistended. Normal bowel sounds. Extremities: LLE 2+ pitting edema with mild weeping, RLE 1+ pitting edema. Palpable pedal pulses. Skin: Warm and dry. No obvious rash or lesions. Neuro: A&Ox3. Moves all extremities. Normal sensation to light touch. No focal deficit. Psych: Normal mood and affect          Labs on Admission:  Basic Metabolic Panel: Recent Labs  Lab 01/21/24 1228  NA 142  K 5.1  CL 105  CO2 25  GLUCOSE 118*  BUN 36*  CREATININE 1.57*  CALCIUM  9.8   Liver Function Tests: No results for input(s): AST, ALT, ALKPHOS, BILITOT, PROT, ALBUMIN in the last 168 hours. No results for input(s): LIPASE, AMYLASE in the last 168 hours. No results for input(s): AMMONIA in the last 168 hours. CBC: Recent Labs  Lab 01/21/24 1228  WBC 12.5*  HGB 11.0*  HCT 34.3*  MCV 96.9  PLT 355   Cardiac Enzymes: No results for input(s): CKTOTAL, CKMB, CKMBINDEX, TROPONINI in the last 168 hours. BNP (last 3 results) Recent Labs    07/11/23 0653  BNP 125.3*    ProBNP (last 3 results) No results for input(s): PROBNP in the last 8760 hours.  CBG: No results for  input(s): GLUCAP in the last 168 hours.  Radiological Exams on Admission: DG Chest Port 1 View Result Date: 01/21/2024 CLINICAL DATA:  Atrial fibrillation with rapid ventricular response, left leg swelling EXAM: PORTABLE CHEST 1 VIEW COMPARISON:  07/11/2023 FINDINGS: Single frontal view of the chest demonstrates stable enlargement of the cardiac silhouette. No acute airspace disease, effusion, or pneumothorax. No acute bony abnormalities. IMPRESSION: 1. No acute intrathoracic process. Electronically Signed   By: Ozell Daring M.D.   On: 01/21/2024 17:13   US  Venous Img Lower  Left (DVT Study) Result Date: 01/21/2024 EXAM: ULTRASOUND DUPLEX OF THE LEFT LOWER EXTREMITY VEINS TECHNIQUE: Duplex ultrasound using B-mode/gray scaled imaging and Doppler spectral analysis and color flow was obtained of the deep venous structures of the left lower extremity. COMPARISON: None. CLINICAL HISTORY: LLE swelling. FINDINGS: The common femoral vein, femoral vein, popliteal vein, and posterior tibial vein of the left lower extremity demonstrate normal compressibility with normal color flow and spectral analysis. Limited images of the contralateral right common femoral vein are negative. There is subcutaneous edema in the left calf. IMPRESSION: 1. No evidence of deep venous thrombosis in the left lower extremity. 2. Subcutaneous edema in the left calf. Electronically signed by: Katheleen Faes MD 01/21/2024 01:08 PM EDT RP Workstation: [id number]   Assessment/Plan [name] is a 83 y.o. male with medical history significant for paroxysmal A-fib on Xarelto , osteoarthritis, venous stasis insufficiency, chronic back pain, CAD, GERD, hiatal hernia, gout, history of bleeding PUD, hyperlipidemia, obstructive sleep apnea on CPAP, class II obesity and chronic constipation who presented to [facility] ED for evaluation of worsening leg swelling and admitted for A-fib with RVR.   # A-fib with RVR - Patient with history of  paroxysmal A-fib presented with worsening leg swelling - Found to have A-fib with RVR w/ HR in the 130s-140s, initially started on diltiazem  drip, switched to amio drip due to hypotension - HR remains in the 100-110s, BP stable with SBP in the 120s - This is likely in the setting of his urinary infection - Cardiology consulted, appreciate recs - Continue IV amiodarone  - Continue Xarelto  - Check echocardiogram, mag and TSH - Telemetry  # Acute cystitis -  Pt presented with frequent urination - Urinalysis shows signs of infection - Continue IV rocephin  - F/u urine culture - Trend CBC and fever curve  # Lower extremity edema # Chronic venous insufficiency - Patient has chronic lower extremity edema has worsened over the last 3 weeks, L>R - LLE U/S negative for DVT but shows subcutaneous edema in the left calf - IV Lasix  40 mg x 1, redose as needed - Apply TED hose  # HLD - Continue atorvastatin   # Hypothyroidism - Continue on Synthroid  - Follow-up TSH  # Depression - Continue duloxetine   # GERD - Continue famotidine   # Chronic constipation - Continue lactulose  and Senokot-S  # OSA - CPAP at bedtime  DVT prophylaxis: Xarelto     Code Status: Full Code  Consults called: Cardiology  Family Communication: No family at bedside  Severity of Illness: The appropriate patient status for this patient is INPATIENT. Inpatient status is judged to be reasonable and necessary in order to provide the required intensity of service to ensure the patient's safety. The patient's presenting symptoms, physical exam findings, and initial radiographic and laboratory data in the context of their chronic comorbidities is felt to place them at high risk for further clinical deterioration. Furthermore, it is not anticipated that the patient will be medically stable for discharge from the hospital within 2 midnights of admission.   * I certify that at the point of admission it is my clinical  judgment that the patient will require inpatient hospital care spanning beyond 2 midnights from the point of admission due to high intensity of service, high risk for further deterioration and high frequency of surveillance required.*  Level of care: Progressive    Lou Claretta HERO, MD 01/21/2024, 10:45 PM [facility] Pager: [phone] Isaiah 41:10   If 7PM-7AM, please contact night-coverage [email or web address] Password [email or web address]

## 2024-01-21 NOTE — ED Notes (Signed)
 Called [facility] to transport the patient to [place] 4E rm# 2

## 2024-01-21 NOTE — ED Provider Notes (Signed)
 Chest x-ray shows no pulmonary edema or other acute abnormality.  Discussed with Dr. To with the hospitalist service who accept the patient to [facility] for [facility].  Discussed with Dr. Vernice with cardiology who request that the hospitalist team let the cardiology service know when the patient has arrived.   Lenor Hollering, MD 01/21/24 1725

## 2024-01-21 NOTE — ED Triage Notes (Signed)
 Patient reports drainage and swelling from left leg. Saw PCP for same but swelling has worsened. Taking diuretic without relief. Wants second opinion.

## 2024-01-22 ENCOUNTER — Inpatient Hospital Stay (HOSPITAL_COMMUNITY)

## 2024-01-22 DIAGNOSIS — I4891 Unspecified atrial fibrillation: Secondary | ICD-10-CM

## 2024-01-22 LAB — CBC
HCT: 33.6 % — ABNORMAL LOW (ref 39.0–52.0)
Hemoglobin: 10.8 g/dL — ABNORMAL LOW (ref 13.0–17.0)
MCH: 30.9 pg (ref 26.0–34.0)
MCHC: 32.1 g/dL (ref 30.0–36.0)
MCV: 96 fL (ref 80.0–100.0)
Platelets: 385 K/uL (ref 150–400)
RBC: 3.5 MIL/uL — ABNORMAL LOW (ref 4.22–5.81)
RDW: 15.9 % — ABNORMAL HIGH (ref 11.5–15.5)
WBC: 13.2 K/uL — ABNORMAL HIGH (ref 4.0–10.5)
nRBC: 0 % (ref 0.0–0.2)

## 2024-01-22 LAB — COMPREHENSIVE METABOLIC PANEL WITH GFR
ALT: 21 U/L (ref 0–44)
AST: 13 U/L — ABNORMAL LOW (ref 15–41)
Albumin: 2.4 g/dL — ABNORMAL LOW (ref 3.5–5.0)
Alkaline Phosphatase: 56 U/L (ref 38–126)
Anion gap: 12 (ref 5–15)
BUN: 32 mg/dL — ABNORMAL HIGH (ref 8–23)
CO2: 27 mmol/L (ref 22–32)
Calcium: 8.9 mg/dL (ref 8.9–10.3)
Chloride: 101 mmol/L (ref 98–111)
Creatinine, Ser: 1.48 mg/dL — ABNORMAL HIGH (ref 0.61–1.24)
GFR, Estimated: 46 mL/min — ABNORMAL LOW (ref >60)
Glucose, Bld: 98 mg/dL (ref 70–99)
Potassium: 4 mmol/L (ref 3.5–5.1)
Sodium: 140 mmol/L (ref 135–145)
Total Bilirubin: 0.6 mg/dL (ref 0.0–1.2)
Total Protein: 5.9 g/dL — ABNORMAL LOW (ref 6.5–8.1)

## 2024-01-22 LAB — TSH: TSH: 0.749 u[IU]/mL (ref 0.350–4.500)

## 2024-01-22 LAB — MAGNESIUM: Magnesium: 1.9 mg/dL (ref 1.7–2.4)

## 2024-01-22 MED ORDER — FUROSEMIDE 10 MG/ML IJ SOLN
80.0000 mg | Freq: Two times a day (BID) | INTRAMUSCULAR | Status: DC
  Administered 2024-01-22 – 2024-01-23 (×2): 80 mg via INTRAVENOUS
  Filled 2024-01-22 (×2): qty 8

## 2024-01-22 NOTE — Plan of Care (Signed)

## 2024-01-22 NOTE — Hospital Course (Addendum)
 Carlos Hanson was admitted to the hospital with the working diagnosis of heart failure exacerbation.   83 y.o. male with PMH of paroxysmal A-fib, osteoarthritis, venous stasis insufficiency, chronic back pain, CAD, GERD, hiatal hernia, gout, history of bleeding PUD, hyperlipidemia, obstructive sleep apnea on CPAP, class II obesity and chronic constipation who presented with worsening edema.   Reported worsening lower extremity edema for the last 3 weeks prior to admission. His was seen by primary care 2 prior, prescribed furosemide  and advised to continue leg elevation and compression.  Unfortunately he had no improvement in his symptoms.   On his initial physical examination HR initially in the 50s to 70s, SBP 110-130s, SpO2 96% on room air.  Lungs with no wheezing or rhonchi, heart with S1 and S2 present, irregularly irregular with no gallops or rubs, abdomen with no distention and positive lower extremity edema ++.   Na 142, K 5.1 Cl 105 bicarbonate 25 glucose 118 bun 36 cr 1,57  Wbc 12,5 hgb 11,0 plt 355  Urine analysis SG 1,010, negative protein, negative nitrates, negative hgb, moderate leukocytes.  Wbc 11-20   Lower extremity doppler ultrasound with no evidence of deep vein thrombosis in the left lower extremity.  Subcutaneous edema in the left calf.   Chest radiograph with cardiomegaly, bilateral hilar vascular congestion, bilateral central interstitial infiltrates, no effusions.   EKG 120 bpm, left axis deviation, right bundle branch block, qtc 489, atrial fibrillation rhythm with PVC, no significant ST segment or T wave changes.   Subsequently went into A-fib with RVR.,  Started on iV Cardizem  and due to hypotension, changed to IV amiodarone .   9/22: Underwent cardioversion x 3 without success 9/24: DCCV again unsuccessful 09/30 direct current cardioversion, briefly successful, then back in atrial fibrillation.  10/02 patient failure direct current cardioversion, rate controlled  was achieved. Plan to continue close follow up as outpatient.

## 2024-01-22 NOTE — Progress Notes (Signed)
 PROGRESS NOTE TELLER [place]KEFIELD  FMW:[id number] DOB: 01/14/1941 DOA: 01/21/2024 PCP: Yolande Toribio [name], MD  Brief Narrative/Hospital Course: Carlos Hanson is a 83 y.o. male with PMH of paroxysmal A-fib on Xarelto , osteoarthritis, venous stasis insufficiency, chronic back pain, CAD, GERD, hiatal hernia, gout, history of bleeding PUD, hyperlipidemia, obstructive sleep apnea on CPAP, class II obesity and chronic constipation who presented to [facility] for evaluation of worsening leg swelling x 3 weeks. He was seen by PCP 2 days PTA and prescribed Lasix  and advised to continue elevation and compression, without improvement.  In the ED : HR initially in the 50s to 70s, SBP 110-130s, SpO2 96% on room air. Initial labs significant for BUN/creatinine 36/1.57, WBC 12.5 (from 25.36 months ago), Hgb 11.0, lactic acid 0.9, UA shows negative nitrite, moderate leuks, WBC 11-20 and many bacteria. Subsequently went into A-fib with RVR.,  Started on iV Cardizem  and due to soft BP,switched to IV amiodarone  and cardiology was consulted and admitted CXR shows no active disease. U/S LLE negative for DVT. S/p IV Rocephin .  UA with WBC 11-20 urine culture sent  Subjective: Seen and examined today Patient complains of being hard of hearing, leg is still swollen, No chest pain nausea vomiting. Overnight heart rate in the 120s-130s, BP 110, saturating well on room air Labs reviewed creatinine about the same 1.4 hemoglobin 10.8 with mild leukocytosis, TSH 0.7  Assessment and plan:  Paroxysmal A-fib with RVR: Remains poorly controlled on Amiodarone  drip,holding home Cardizem , continue Xarelto  possible DCCV if he remains in A-fib. TSH stable, echo and further plan per cardiology. Discussed w/ cardiology.  Acute cystitis: Follow-up urine culture continue IV ceftriaxone   Lower extremity edema Chronic venous insufficiency: Recently had leg swelling worsening x 3 weeks.  Negative for DVT.  Continue diuretics further plan  per cardiology, follow-up echo  Normocytic anemia: Mildly low, monitor  CKD stage IIIb: Renal function is stable previous baseline around 1.6-1.9  HLD: Continue statin  Hypothyroidism: Euthyroid continue home Synthroid   Depression On chronic Aricept  Mood stable on duloxetine , Aricept  gabapentin   GERD: Continue Pepcid   Chronic constipation: Continue lactulose /Senokot-S  OSA: Continue CPAP at bedtime   Mobility: PT Orders:  PT Follow up Rec:    DVT prophylaxis: Place TED hose Start: 01/21/24 2316 Code Status:   Code Status: Full Code Family Communication: plan of care discussed with patient at bedside. Patient status is: Remains hospitalized because of severity of illness Level of care: Progressive   Dispo: The patient is from: home            Anticipated disposition: TBD Objective: Vitals last 24 hrs: Vitals:   01/22/24 0122 01/22/24 0304 01/22/24 0511 01/22/24 0736  BP: (!) 106/90 104/76 (!) 129/95 110/75  Pulse: (!) 123 (!) 116 (!) 125 (!) 129  Resp: 16 18 16 16   Temp:  97.8 F (36.6 C)  97.6 F (36.4 C)  TempSrc:  Oral  Oral  SpO2: 98% 100% 100% 100%    Physical Examination:  General exam: alert awake, oriented, older than stated age, hard of hearing HEENT:Oral mucosa moist, Ear/Nose WNL grossly Respiratory system: Bilaterally clear BS,no use of accessory muscle Cardiovascular system: S1 & S2 +, regular rate regular and fast no JVD. Gastrointestinal system: Abdomen soft,NT,ND, BS+ Nervous System: Alert, awake, moving all extremities,and following commands. Extremities: extremities warm, leg edema ++ Skin: No rashes,no icterus. MSK: Normal muscle bulk,tone, power   Medications reviewed:  Scheduled Meds:  atorvastatin   20 mg Oral Daily   donepezil   5 mg Oral QHS   DULoxetine   60 mg Oral BID   famotidine   20 mg Oral Daily   gabapentin   600 mg Oral TID   lactulose   20 g Oral Daily   levothyroxine   50 mcg Oral QAC breakfast   melatonin  10 mg  Oral QHS   multivitamin  1 tablet Oral Daily   Rivaroxaban   15 mg Oral Q supper   senna-docusate  1 tablet Oral QHS   Continuous Infusions:  amiodarone  30 mg/hr (01/21/24 2252)   cefTRIAXone  (ROCEPHIN )  IV     Diet: Diet Order             Diet Heart Room service appropriate? Yes; Fluid consistency: Thin  Diet effective now                   Data Reviewed: I have personally reviewed following labs and imaging studies ( see epic result tab) CBC: Recent Labs  Lab 01/21/24 1228 01/22/24 0337  WBC 12.5* 13.2*  HGB 11.0* 10.8*  HCT 34.3* 33.6*  MCV 96.9 96.0  PLT 355 385   CMP: Recent Labs  Lab 01/21/24 1228 01/22/24 0337  NA 142 140  K 5.1 4.0  CL 105 101  CO2 25 27  GLUCOSE 118* 98  BUN 36* 32*  CREATININE 1.57* 1.48*  CALCIUM  9.8 8.9  MG  --  1.9   GFR: CrCl cannot be calculated (Unknown ideal weight.). Recent Labs  Lab 01/22/24 0337  AST 13*  ALT 21  ALKPHOS 56  BILITOT 0.6  PROT 5.9*  ALBUMIN 2.4*   No results for input(s): LIPASE, AMYLASE in the last 168 hours. No results for input(s): AMMONIA in the last 168 hours. Coagulation Profile: No results for input(s): INR, PROTIME in the last 168 hours. Unresulted Labs (From admission, onward)     Start     Ordered   01/23/24 0500  Basic metabolic panel with GFR  Daily,   R      01/22/24 0817   01/23/24 0500  CBC  Daily,   R      01/22/24 0817   01/23/24 0500  Magnesium   Tomorrow morning,   R        01/22/24 0817   01/21/24 1618  Urine Culture  Once,   URGENT       Question:  Indication  Answer:  Urgency/frequency   01/21/24 1617           Antimicrobials/Microbiology: Anti-infectives (From admission, onward)    Start     Dose/Rate Route Frequency Ordered Stop   01/22/24 1800  cefTRIAXone  (ROCEPHIN ) 1 g in sodium chloride  0.9 % 100 mL IVPB        1 g 200 mL/hr over 30 Minutes Intravenous Every 24 hours 01/21/24 2327     01/21/24 1630  cefTRIAXone  (ROCEPHIN ) 1 g in sodium chloride   0.9 % 100 mL IVPB        1 g 200 mL/hr over 30 Minutes Intravenous  Once 01/21/24 1627 01/21/24 1805         Component Value Date/Time   SDES  07/11/2023 9346    BLOOD LEFT ANTECUBITAL Performed at [facility], [address], [place], [name] [address]    SPECREQUEST  07/11/2023 [phone]    BOTTLES DRAWN AEROBIC AND ANAEROBIC Blood Culture results may not be optimal due to an inadequate volume of blood received in culture bottles Performed at [facility], [address] [place],  [place] [address]    CULT GROUP B STREP(S.AGALACTIAE)ISOLATED (A) 07/11/2023 0653   REPTSTATUS 07/14/2023 FINAL 07/11/2023 9346    Procedures:    Mennie LAMY, MD [facility] 01/22/2024, 9:50 AM

## 2024-01-22 NOTE — Consult Note (Addendum)
 Cardiology Consult Note:   Patient ID: Carlos Hanson MRN: [id number]; DOB: 02/24/1941   Admission date: 01/21/2024  Primary Care Provider: Yolande Toribio [name], MD Primary Cardiologist: Gordy Bergamo, MD  Primary Electrophysiologist:  None   Patient Profile:   Carlos Hanson is a 83 y.o. male with a history of for atrial fibrillation, CAD, reflux, and other co morbidities  History of Present Illness:   Carlos Hanson initially presented to the ED with worsening left lower extremity swelling. During his evaluation he went into A-fib with RVR rates 110 through 130s. He was started on an IV diltiazem  infusion however his blood pressures dropped. IV diltiazem  was discontinued and he was started on amiodarone . Of note, he is currently being treated for a UTI. History was obtained via chart review as Carlos Hanson was a sleep and unable to awoken.   Past Medical History:  Diagnosis Date   Arthritis    Chronic back pain    Coronary artery disease    40% mLAD/DIAG bifurcation 03/25/10    Dysrhythmia    History of atrial fibrillation   Gout    H/O hiatal hernia    Hearing decreased    right ear   History of bladder infections    History of bleeding ulcers    History of ulcer disease    Hypercholesteremia    Nocturia    Obstructive sleep apnea hypopnea, severe    Sleep apnea    cpap sleep study 2011    Past Surgical History:  Procedure Laterality Date   BACK SURGERY     CARDIAC CATHETERIZATION     2011   cardioverson     COLONOSCOPY  08/27/2011   Procedure: COLONOSCOPY;  Surgeon: Claudis RAYMOND Rivet, MD;  Location: [facility];  Service: Endoscopy;  Laterality: N/A;  930   ESOPHAGOGASTRODUODENOSCOPY     JOINT REPLACEMENT     left shoulder   Left shoulder replacement     LUMBAR LAMINECTOMY  03/15/2012   Procedure: LUMBAR LAMINECTOMY WITH  X-STOP 2 LEVEL;  Surgeon: Victory Gens, MD;  Location: [facility];  Service: Neurosurgery;  Laterality: N/A;  Lumbar two-three, three-four XSTOP    TONSILLECTOMY       Medications Prior to Admission: Prior to Admission medications   Medication Sig Start Date End Date Taking? Authorizing Provider  acetaminophen  (TYLENOL ) 500 MG tablet Take 2 tablets (1,000 mg total) by mouth every 8 (eight) hours. Patient taking differently: Take 1,000-1,500 mg by mouth daily as needed for mild pain (pain score 1-3) or moderate pain (pain score 4-6). 03/13/18  Yes Samtani, Jai-Gurmukh, MD  amiodarone  (PACERONE ) 200 MG tablet Take 0.5 tablets (100 mg total) by mouth daily. 10/12/23  Yes Bergamo Gordy, MD  atorvastatin  (LIPITOR) 20 MG tablet Take 1 tablet by mouth daily. 07/22/18  Yes [provider]  clobetasol (TEMOVATE) 0.05 % external solution Apply 1 Application topically daily as needed (psoriasis). 05/24/23  Yes [provider]  diltiazem  (CARDIZEM  CD) 120 MG 24 hr capsule Take 120 mg by mouth daily.   Yes [provider]  donepezil  (ARICEPT ) 5 MG tablet Take 5 mg by mouth at bedtime. 04/23/23  Yes [provider]  DULoxetine  (CYMBALTA ) 60 MG capsule Take 60 mg by mouth in the morning, at noon, and at bedtime.   Yes [provider]  famotidine  (PEPCID ) 20 MG tablet Take 20 mg by mouth daily. 04/04/21  Yes [provider]  gabapentin  (NEURONTIN ) 300 MG capsule Take 600 mg by  mouth 3 (three) times daily.   Yes [provider]  ketoconazole (NIZORAL) 2 % cream Apply 1 Application topically daily as needed for irritation. 06/29/23  Yes [provider]  lactulose  (CHRONULAC ) 10 GM/15ML solution Take 30 mLs (20 g total) by mouth daily. 03/13/18  Yes Samtani, Jai-Gurmukh, MD  levothyroxine  (SYNTHROID ) 50 MCG tablet Take 50 mcg by mouth daily before breakfast.   Yes [provider]  MELATONIN PO Take 10 mg by mouth at bedtime.   Yes [provider]  Multiple Vitamins-Minerals (VISION FORMULA EYE HEALTH) CAPS Take 1 tablet by mouth 2 (two) times daily. [employer]   Yes [provider]  oxymetazoline  (AFRIN) 0.05 % nasal spray Place 1 spray into both nostrils at bedtime as needed for congestion (sinuses).   Yes [provider]  Rivaroxaban  (XARELTO ) 15 MG TABS tablet Take 1 tablet (15 mg total) by mouth daily with supper. 12/13/23  Yes Ladona Heinz, MD  SENEXON-S 8.6-50 MG tablet Take 1 tablet by mouth at bedtime.   Yes [provider]     Allergies:    Allergies  Allergen Reactions   Celecoxib Other (See Comments)    Ineffective   Penicillin G Sodium Nausea Only   Naprosyn [Naproxen] Rash    Social History:   Social History   Socioeconomic History   Marital status: Married    Spouse name: [name]    Number of children: 3   Years of education: college   Highest education level: Not on file  Occupational History    Comment: Retired  Tobacco Use   Smoking status: Former    Current packs/day: 0.00    Average packs/day: 1 pack/day for 25.0 years (25.0 ttl pk-yrs)    Types: Cigarettes    Start date: 07/13/1980    Quit date: 07/13/2005    Years since quitting: 18.5   Smokeless tobacco: Former    Types: Chew   Tobacco comments:    Quit 8 years ago.  Vaping Use   Vaping status: Never Used  Substance and Sexual Activity   Alcohol  use: Yes    Alcohol /week: 2.0 standard drinks of alcohol     Types: 2 Shots of liquor per week    Comment: once a week   Drug use: No   Sexual activity: Not on file  Other Topics Concern   Not on file  Social History Narrative   Patient lives at home with [name]).    Retired.   Education college.   Right handed.   Caffeine one cup of coffee daily.   Social Drivers of Corporate investment banker Strain: Not on file  Food Insecurity: No Food Insecurity (01/21/2024)   Hunger Vital Sign    Worried About Running Out of Food in the Last Year: Never true    Ran Out of Food in the Last Year: Never true  Transportation Needs: No Transportation Needs (01/21/2024)   PRAPARE - Therapist, art (Medical): No    Lack of Transportation (Non-Medical): No  Physical Activity: Not on file  Stress: Not on file  Social Connections: Moderately Isolated (01/21/2024)   Social Connection and Isolation Panel    Frequency of Communication with Friends and Family: More than three times a week    Frequency of Social Gatherings with Friends and Family: More than three times a week    Attends Religious Services: Patient declined    Database administrator or Organizations: No  Attends Banker Meetings: Patient declined    Marital Status: Married  Catering manager Violence: Not At Risk (01/21/2024)   Humiliation, Afraid, Rape, and Kick questionnaire    Fear of Current or Ex-Partner: No    Emotionally Abused: No    Physically Abused: No    Sexually Abused: No    Family History:   The patient's family history includes Arthritis in his mother; Diabetes in his mother; Heart failure in his father and mother; High blood pressure in his mother. There is no history of Colon cancer or Sleep apnea.    Review of Systems:  unable to inquire   Physical Exam/Data:   Vitals:   01/21/24 2348 01/22/24 0122 01/22/24 0304 01/22/24 0511  BP:  (!) 106/90 104/76 (!) 129/95  Pulse: 78 (!) 123 (!) 116 (!) 125  Resp: 19 16 18 16   Temp:   97.8 F (36.6 C)   TempSrc:   Oral   SpO2: 100% 98% 100% 100%    Intake/Output Summary (Last 24 hours) at 01/22/2024 0650 Last data filed at 01/22/2024 0100 Gross per 24 hour  Intake 324.07 ml  Output 1100 ml  Net -775.93 ml    General:  Resting comfortably  HEENT: normal Neck: no JVD Cardiac: unable to auscultate due to heavy breath sounds while sleeping  Lungs:  non-labored respirations  Abd: soft Ext:  Left leg swelling present  Musculoskeletal:  No deformities Skin: warm and dry  Psych:  Normal affect    EKG: EKG findings: 15:16 p.m. atrial fibrillation at a rate of 142 bpm. Telemetry reviewed. Rates between 130-138 bpm.    Relevant CV Studies:  ECHO  2011 (copied from cardiology note) Left ventricle: The cavity size was normal. There was focal basal and moderate concentric hypertrophy. Systolic function was normal. The estimated ejection fraction was in the range of 60% to 65%. Doppler parameters are consistent with elevated mean left atrial filling pressure. - Ventricular septum: The contour showed systolic flattening.  - Left atrium: The atrium was moderately dilated.  - Right ventricle: The cavity size was mildly dilated.   Laboratory Data: Component     Latest Ref Rng 01/21/2024  Sodium     135 - 145 mmol/L 142   Potassium     3.5 - 5.1 mmol/L 5.1   Chloride     98 - 111 mmol/L 105   CO2     22 - 32 mmol/L 25   Glucose     70 - 99 mg/dL 881 (H)   BUN     8 - 23 mg/dL 36 (H)   Creatinine     0.61 - 1.24 mg/dL 8.42 (H)   Calcium      8.9 - 10.3 mg/dL 9.8   GFR, Estimated     >60 mL/min 43 (L)   Anion gap     5 - 15  12     Component     Latest Ref Rng 01/21/2024  WBC     4.0 - 10.5 K/uL 12.5 (H)   RBC     4.22 - 5.81 MIL/uL 3.54 (L)   Hemoglobin     13.0 - 17.0 g/dL 88.9 (L)   HCT     60.9 - 52.0 % 34.3 (L)   MCV     80.0 - 100.0 fL 96.9   MCH     26.0 - 34.0 pg 31.1   MCHC     30.0 - 36.0 g/dL 67.8   RDW  11.5 - 15.5 % 15.9 (H)   Platelets     150 - 400 K/uL 355   nRBC     0.0 - 0.2 % 0.0     Component     Latest Ref Rng 01/21/2024  Lactic Acid, Venous     0.5 - 1.9 mmol/L 0.9     Radiology/Studies:  DG Chest Port 1 View Result Date: 01/21/2024 CLINICAL DATA:  Atrial fibrillation with rapid ventricular response, left leg swelling EXAM: PORTABLE CHEST 1 VIEW COMPARISON:  07/11/2023 FINDINGS: Single frontal view of the chest demonstrates stable enlargement of the cardiac silhouette. No acute airspace disease, effusion, or pneumothorax. No acute bony abnormalities. IMPRESSION: 1. No acute intrathoracic process. Electronically Signed   By: Ozell Daring M.D.   On:  01/21/2024 17:13   US  Venous Img Lower  Left (DVT Study) Result Date: 01/21/2024 EXAM: ULTRASOUND DUPLEX OF THE LEFT LOWER EXTREMITY VEINS TECHNIQUE: Duplex ultrasound using B-mode/gray scaled imaging and Doppler spectral analysis and color flow was obtained of the deep venous structures of the left lower extremity. COMPARISON: None. CLINICAL HISTORY: LLE swelling. FINDINGS: The common femoral vein, femoral vein, popliteal vein, and posterior tibial vein of the left lower extremity demonstrate normal compressibility with normal color flow and spectral analysis. Limited images of the contralateral right common femoral vein are negative. There is subcutaneous edema in the left calf. IMPRESSION: 1. No evidence of deep venous thrombosis in the left lower extremity. 2. Subcutaneous edema in the left calf. Electronically signed by: Dayne Hassell MD 01/21/2024 01:08 PM EDT RP Workstation: [id number]    Assessment and Plan:   Mr. Smeltzer presents to the ED with lower leg swelling and found to be atrial fibrillation with rapid ventricular response. His rates are difficult to control given his hypotension (current BP 106/76). Amiodarone  does not appear to be controlling his rates. We could attempt DCCV next week to get him back into sinus rhythm if he does not convert on his own.   Afib with RVR  - Continue amiodarone  gtt - Agree with holding home diltiazem  - Continue rivaroxaban   - We can consider a DCCV if he remains in afib   For questions or updates, please contact [facility] Please consult [email or web address] for contact info under    Merlene Blood, MD MS  Cardiology Moonlighter   I have personally evaluated and examined the patient. The history, physical exam, and medical decision making documented below were performed independently and substantively by me. I have reviewed all relevant data, formulated the assessment and plan, and assumed responsibility for the management of this patient. My  documentation reflects the substantive portion of the split/shared visit, in accordance with CMS and CPT guidelines. I have updated the cardiology fellow's documentation above as appropriate.  I have personally performed the substantive portion of the medical decision making, including interpretation of diagnostic data, formulation of the management plan, and assessment of risks. In summation:  Mr. Dunker with atrial fibrillation, presents with fluid retention.  The patient has a history of atrial fibrillation that initially occurred approximately fifteen years ago and resolved spontaneously. Recently, they have experienced a recurrence of atrial fibrillation. They recently took prednisone for pain management. They are currently on amiodarone , which was started after their blood pressure was noted to be low, leading to the discontinuation of diltiazem .  He does note that prior to three weeks ago he has no SOB and was swimming laps every day.  Sudden onset of three years ago he has  new SOB, DOE, weight gain and leg swelling.  Due to the recent symptoms, they have not been able to maintain this level of activity and have been sedentary for about three weeks.  No significant breathing difficulties are reported, but they acknowledge a decrease in overall physical activity. They have not experienced any recent injuries that could explain the symptoms, although they mentioned a minor knee impact. During the review of symptoms, they did not report classic symptoms of atrial fibrillation such as palpitations or feeling their heart 'beating out of my chest,' but they acknowledge not feeling well overall.  15 years ago, has palpitations with AF.   Exam notable for  Gen: no distress, morbid obesity  Neck: Thick neck Cardiac: No Rubs or Gallops, regular tachycardia Respiratory: Clear to auscultation bilaterally GI: Soft, nontender, distended with fluid wae MS: +1 bilateral edema;  moves all extremities Integument:  Skin feels warm Neuro:  At time of evaluation, alert and oriented to person/place/time/situation  Psych: Normal affect, patient feels ok  Tele: AF RVR rates ~ 120-140 Additional tests:    In assessment and plan:   Atrial fibrillation RVR (duration unclaer) Recurrent atrial fibrillation, possibly triggered by prednisone use and fluid overload. Episode may have started approximately three weeks ago, coinciding with decreased exercise tolerance and fluid retention. Currently on amiodarone  due to low blood pressure, and diltiazem  was discontinued. Concern that persistent atrial fibrillation may exacerbate fluid retention and vice versa. - Pending echocardiogram to assess cardiac function and the impact of tachycardia on heart function. - Consider cardioversion on Monday if not back in rhythm by then (added to the schedule, consented, called wife with patient and left voicemail about care)  Fluid overload HF Phenotype with Baselin CKD III b Fluid overload likely contributing to atrial fibrillation and decreased exercise tolerance. Reports swelling and weight gain starting about three to four weeks ago. Concern about the cyclical relationship between fluid overload and atrial fibrillation. 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 Previously swimming daily for 40 minutes without stopping but has been unable to maintain this level of activity. - continuing with CPAP (at bedside), patient has morbid obesity, no smoking or alcohol  use, home diltiazem  stopped due to hypotension  Plans for diuresis and amiodarone  then DCCV early next week  Stanly Leavens, MD FASE [facility] Cardiologist [facility]  [address], [address] [place], [name]  [address] [phone]  9:50 AM

## 2024-01-22 NOTE — Progress Notes (Addendum)
Heart rate too high for accurate echo at this time. 

## 2024-01-23 ENCOUNTER — Other Ambulatory Visit (HOSPITAL_COMMUNITY)

## 2024-01-23 DIAGNOSIS — N1832 Chronic kidney disease, stage 3b: Secondary | ICD-10-CM

## 2024-01-23 DIAGNOSIS — I4891 Unspecified atrial fibrillation: Secondary | ICD-10-CM | POA: Diagnosis not present

## 2024-01-23 DIAGNOSIS — I509 Heart failure, unspecified: Secondary | ICD-10-CM | POA: Diagnosis not present

## 2024-01-23 LAB — BASIC METABOLIC PANEL WITH GFR
Anion gap: 13 (ref 5–15)
BUN: 33 mg/dL — ABNORMAL HIGH (ref 8–23)
CO2: 26 mmol/L (ref 22–32)
Calcium: 8.7 mg/dL — ABNORMAL LOW (ref 8.9–10.3)
Chloride: 101 mmol/L (ref 98–111)
Creatinine, Ser: 1.58 mg/dL — ABNORMAL HIGH (ref 0.61–1.24)
GFR, Estimated: 43 mL/min — ABNORMAL LOW (ref >60)
Glucose, Bld: 100 mg/dL — ABNORMAL HIGH (ref 70–99)
Potassium: 4.3 mmol/L (ref 3.5–5.1)
Sodium: 140 mmol/L (ref 135–145)

## 2024-01-23 LAB — CBC
HCT: 37.5 % — ABNORMAL LOW (ref 39.0–52.0)
Hemoglobin: 12 g/dL — ABNORMAL LOW (ref 13.0–17.0)
MCH: 30.6 pg (ref 26.0–34.0)
MCHC: 32 g/dL (ref 30.0–36.0)
MCV: 95.7 fL (ref 80.0–100.0)
Platelets: 328 K/uL (ref 150–400)
RBC: 3.92 MIL/uL — ABNORMAL LOW (ref 4.22–5.81)
RDW: 15.9 % — ABNORMAL HIGH (ref 11.5–15.5)
WBC: 11.7 K/uL — ABNORMAL HIGH (ref 4.0–10.5)
nRBC: 0 % (ref 0.0–0.2)

## 2024-01-23 LAB — URINE CULTURE: Culture: >=100000 — AB

## 2024-01-23 LAB — MAGNESIUM: Magnesium: 1.8 mg/dL (ref 1.7–2.4)

## 2024-01-23 MED ORDER — DICLOFENAC SODIUM 1 % EX GEL
2.0000 g | Freq: Three times a day (TID) | CUTANEOUS | Status: DC | PRN
  Administered 2024-01-23 – 2024-01-28 (×5): 2 g via TOPICAL
  Filled 2024-01-23 (×2): qty 100

## 2024-01-23 MED ORDER — FUROSEMIDE 10 MG/ML IJ SOLN
100.0000 mg | Freq: Two times a day (BID) | INTRAVENOUS | Status: DC
  Administered 2024-01-23 – 2024-01-24 (×2): 100 mg via INTRAVENOUS
  Filled 2024-01-23 (×3): qty 10

## 2024-01-23 MED ORDER — SODIUM CHLORIDE 0.9 % IV SOLN: INTRAVENOUS | Status: DC

## 2024-01-23 NOTE — Progress Notes (Signed)
   01/23/24 1418  Assess: MEWS Score  Pulse Rate (!) 140  ECG Heart Rate (!) 132  Resp (!) 28  SpO2 97 %  Assess: MEWS Score  MEWS Temp 0  MEWS Systolic 0  MEWS Pulse 3  MEWS RR 2  MEWS LOC 0  MEWS Score 5  MEWS Score Color Red  Assess: SIRS CRITERIA  SIRS Temperature  0  SIRS Respirations  1  SIRS Pulse 1  SIRS WBC 0  SIRS Score Sum  2     Paged Cardiology regarding short burst of SVT.

## 2024-01-23 NOTE — Plan of Care (Signed)

## 2024-01-23 NOTE — Progress Notes (Signed)
   01/23/24 2146  BiPAP/CPAP/SIPAP  BiPAP/CPAP/SIPAP Pt Type Adult  Patient Home Machine Yes  Patient Home Mask Yes  Patient Home Tubing Yes

## 2024-01-23 NOTE — Progress Notes (Signed)
 PROGRESS NOTE Carlos Hanson  FMW:[id number] DOB: 08-21-1940 DOA: 01/21/2024 PCP: Yolande Toribio [name], MD  Brief Narrative/Hospital Course: Carlos Hanson is a 83 y.o. male with PMH of paroxysmal A-fib on Xarelto , osteoarthritis, venous stasis insufficiency, chronic back pain, CAD, GERD, hiatal hernia, gout, history of bleeding PUD, hyperlipidemia, obstructive sleep apnea on CPAP, class II obesity and chronic constipation who presented to [facility] for evaluation of worsening leg swelling x 3 weeks. He was seen by PCP 2 days PTA and prescribed Lasix  and advised to continue elevation and compression, without improvement.  In the ED : HR initially in the 50s to 70s, SBP 110-130s, SpO2 96% on room air. Initial labs significant for BUN/creatinine 36/1.57, WBC 12.5 (from 25.36 months ago), Hgb 11.0, lactic acid 0.9, UA shows negative nitrite, moderate leuks, WBC 11-20 and many bacteria. Subsequently went into A-fib with RVR.,  Started on iV Cardizem  and due to soft BP,switched to IV amiodarone  and cardiology was consulted and admitted CXR shows no active disease. U/S LLE negative for DVT. S/p IV Rocephin .  UA with WBC 11-20 urine culture sent  Subjective: Seen and examined today Is hard of hearing denies chest pain shortness of breath palpitation. Overnight heart rate remains in 120s-140s in A-fib Hemoglobin at 12 Is mostly on the bed without any complaint  Assessment and plan:  Paroxysmal A-fib with RVR: Remains poorly controlled.Cont Amio gtt, xarelto , cardiology following will likely need DCCV TSH stable, echo  pending-heart rate too high for echo.   Acute cystitis due to Klebsiella pneumoniae: Follow-up urine culture continue IV ceftriaxone > repeat soon  Lower extremity edema Chronic venous insufficiency: Recently had leg swelling worsening x 3 weeks. Negative for DVT. Continue IV Lasix  50 Q2 lower, monitor intake output  Normocytic anemia: Mildly low, monitor  CKD stage IIIb: Renal  function is stable previous baseline around 1.6-1.9 Recent Labs    07/11/23 0837 07/11/23 1423 07/12/23 0432 01/21/24 1228 01/22/24 0337 01/23/24 0250  BUN 47* 42* 39* 36* 32* 33*  CREATININE 1.90* 1.85* 1.63* 1.57* 1.48* 1.58*  CO2  --  19* 23 25 27 26   K 5.2* 4.7 4.9 5.1 4.0 4.3   HLD: Continue statin  Hypothyroidism: Is Euthyroid continue home Synthroid   Depression On chronic Aricept  Mood stable , continue  duloxetine , Aricept  gabapentin   GERD: Continue Pepcid   Chronic constipation: Continue lactulose /Senokot-S  OSA: Continue CPAP at bedtime  DVT prophylaxis: Place TED hose Start: 01/21/24 2316 Code Status:   Code Status: Full Code Family Communication: plan of care discussed with patient at bedside. Patient status is: Remains hospitalized because of severity of illness Level of care: Progressive   Dispo: The patient is from: home            Anticipated disposition: TBD Objective: Vitals last 24 hrs: Vitals:   01/23/24 0717 01/23/24 0737 01/23/24 0837 01/23/24 0900  BP:  (!) 125/90    Pulse: 81 86 (!) 102 84  Resp: 20 (!) 21 (!) 22 18  Temp:  99.9 F (37.7 C)    TempSrc:  Oral    SpO2: 93% 93% 97% 95%    Physical Examination: General exam: AAOX3, obese, chronically sick looking HEENT:Oral mucosa moist, Ear/Nose WNL grossly Respiratory system: Bilaterally clear BS,no use of accessory muscle Cardiovascular system: S1 & S2 +, irregularly irregular, fast no JVD. Gastrointestinal system: Abdomen soft,NT,ND, BS+ Nervous System: Alert, awake, moving all extremities,and following commands. Extremities: extremities warm, leg edema ++ Skin: No rashes,no icterus. MSK: Normal muscle bulk,tone, power  Medications reviewed:  Scheduled Meds:  atorvastatin   20 mg Oral Daily   donepezil   5 mg Oral QHS   DULoxetine   60 mg Oral BID   famotidine   20 mg Oral Daily   furosemide   80 mg Intravenous BID   gabapentin   600 mg Oral TID   lactulose   20 g Oral Daily    levothyroxine   50 mcg Oral QAC breakfast   melatonin  10 mg Oral QHS   multivitamin  1 tablet Oral Daily   Rivaroxaban   15 mg Oral Q supper   senna-docusate  1 tablet Oral QHS   Continuous Infusions:  amiodarone  30 mg/hr (01/22/24 2229)   cefTRIAXone  (ROCEPHIN )  IV 1 g (01/22/24 1901)   Diet: Diet Order             Diet Heart Room service appropriate? Yes; Fluid consistency: Thin  Diet effective now                   Data Reviewed: I have personally reviewed following labs and imaging studies ( see epic result tab) CBC: Recent Labs  Lab 01/21/24 1228 01/22/24 0337 01/23/24 0250  WBC 12.5* 13.2* 11.7*  HGB 11.0* 10.8* 12.0*  HCT 34.3* 33.6* 37.5*  MCV 96.9 96.0 95.7  PLT 355 385 328   CMP: Recent Labs  Lab 01/21/24 1228 01/22/24 0337 01/23/24 0250  NA 142 140 140  K 5.1 4.0 4.3  CL 105 101 101  CO2 25 27 26   GLUCOSE 118* 98 100*  BUN 36* 32* 33*  CREATININE 1.57* 1.48* 1.58*  CALCIUM  9.8 8.9 8.7*  MG  --  1.9 1.8   GFR: CrCl cannot be calculated (Unknown ideal weight.). Recent Labs  Lab 01/22/24 0337  AST 13*  ALT 21  ALKPHOS 56  BILITOT 0.6  PROT 5.9*  ALBUMIN 2.4*   No results for input(s): LIPASE, AMYLASE in the last 168 hours. No results for input(s): AMMONIA in the last 168 hours. Coagulation Profile: No results for input(s): INR, PROTIME in the last 168 hours. Unresulted Labs (From admission, onward)     Start     Ordered   01/23/24 0500  Basic metabolic panel with GFR  Daily,   R      01/22/24 0817   01/23/24 0500  CBC  Daily,   R      01/22/24 0817           Antimicrobials/Microbiology: Anti-infectives (From admission, onward)    Start     Dose/Rate Route Frequency Ordered Stop   01/22/24 1800  cefTRIAXone  (ROCEPHIN ) 1 g in sodium chloride  0.9 % 100 mL IVPB        1 g 200 mL/hr over 30 Minutes Intravenous Every 24 hours 01/21/24 2327     01/21/24 1630  cefTRIAXone  (ROCEPHIN ) 1 g in sodium chloride  0.9 % 100 mL IVPB         1 g 200 mL/hr over 30 Minutes Intravenous  Once 01/21/24 1627 01/21/24 1805         Component Value Date/Time   SDES  01/21/2024 1618    URINE, CLEAN CATCH Performed at [facility], [address] [place], [name] [address]    [facility]  01/21/2024 1618    NONE Performed at [facility], [address], [place], [name] [address]    CULT >=100,000 COLONIES/mL KLEBSIELLA PNEUMONIAE (A) 01/21/2024 1618   REPTSTATUS 01/23/2024 FINAL 01/21/2024 1618    Procedures:    Mennie LAMY, MD  [facility] 01/23/2024, 11:15 AM

## 2024-01-23 NOTE — Plan of Care (Signed)

## 2024-01-23 NOTE — Progress Notes (Signed)
   Progress Note  Patient Name: Carlos Hanson Date of Encounter: 01/23/2024 Primary Cardiologist: Gordy Bergamo, MD   Subjective   Overnight heart rates have normalized briefly Still volume overloaded but feels much better.  Vital Signs    Vitals:   01/23/24 0547 01/23/24 0717 01/23/24 0737 01/23/24 0837  BP: 112/74  (!) 125/90   Pulse: (!) 125 81 86 (!) 102  Resp:  20 (!) 21 (!) 22  Temp:   99.9 F (37.7 C)   TempSrc:   Oral   SpO2:  93% 93% 97%    Intake/Output Summary (Last 24 hours) at 01/23/2024 0850 Last data filed at 01/23/2024 0600 Gross per 24 hour  Intake 1012.68 ml  Output 3150 ml  Net -2137.32 ml   There were no vitals filed for this visit. [facility])  Physical Exam   GEN: No acute distress.   Neck: + JVD Cardiac: IRRR, no murmurs, rubs, or gallops.  Respiratory: Coarse breath aounds bilaterally. GI: Soft, nontender, non-distended  MS: +2 edema  Labs   Telemetry: AF RVR   Chemistry Recent Labs  Lab 01/21/24 1228 01/22/24 0337 01/23/24 0250  NA 142 140 140  K 5.1 4.0 4.3  CL 105 101 101  CO2 25 27 26   GLUCOSE 118* 98 100*  BUN 36* 32* 33*  CREATININE 1.57* 1.48* 1.58*  CALCIUM  9.8 8.9 8.7*  PROT  --  5.9*  --   ALBUMIN  --  2.4*  --   AST  --  13*  --   ALT  --  21  --   ALKPHOS  --  56  --   BILITOT  --  0.6  --   GFRNONAA 43* 46* 43*  ANIONGAP 12 12 13      Hematology Recent Labs  Lab 01/21/24 1228 01/22/24 0337 01/23/24 0250  WBC 12.5* 13.2* 11.7*  RBC 3.54* 3.50* 3.92*  HGB 11.0* 10.8* 12.0*  HCT 34.3* 33.6* 37.5*  MCV 96.9 96.0 95.7  MCH 31.1 30.9 30.6  MCHC 32.1 32.1 32.0  RDW 15.9* 15.9* 15.9*  PLT 355 385 328     Assessment & Plan   Atrial fibrillation RVR (duration clear) - Pending echocardiogram to assess cardiac function and the impact of tachycardia on heart function (was unable to do 9/20 due to  rates ~ 140s). - Currently he has missed no doses of AC, He is currently schedule for DCCV on Monday; I will have his  upcoming inpatient cardiologist see him prior DCCV as we are rapidly working on his volume status   HF Phenotype with Baseline CKD III b - acute on chronic has improved with diuresis - 100 IV Lasix  BID - echo is pending (unable to do yesterday due to rates 140s, modest improvement today)   Morbid obesity OSA Hypotension has resolved - continuing with CPAP (at bedside), patient has morbid obesity, no smoking or alcohol  use  Reviewed with wife, who is pending an ablation on Wednesday with Dr. Kennyth; this may affect DC planning   For questions or updates, please contact [facility] Please consult [email or web address] for contact info under Cardiology/STEMI.      Stanly Leavens, MD FASE [facility] Cardiologist [facility]  [address] [place], [address] [place], [name] [address] [phone]  8:50 AM

## 2024-01-24 ENCOUNTER — Encounter (HOSPITAL_COMMUNITY)
Admission: EM | Disposition: A | Discharge status: Skilled Nursing Facility | Payer: Self-pay | Source: Home / Self Care | Attending: Internal Medicine

## 2024-01-24 ENCOUNTER — Inpatient Hospital Stay (HOSPITAL_COMMUNITY)

## 2024-01-24 ENCOUNTER — Inpatient Hospital Stay (HOSPITAL_COMMUNITY): Admitting: Anesthesiology

## 2024-01-24 DIAGNOSIS — I10 Essential (primary) hypertension: Secondary | ICD-10-CM

## 2024-01-24 DIAGNOSIS — N1832 Chronic kidney disease, stage 3b: Secondary | ICD-10-CM | POA: Diagnosis not present

## 2024-01-24 DIAGNOSIS — I2510 Atherosclerotic heart disease of native coronary artery without angina pectoris: Secondary | ICD-10-CM | POA: Diagnosis not present

## 2024-01-24 DIAGNOSIS — G4733 Obstructive sleep apnea (adult) (pediatric): Secondary | ICD-10-CM | POA: Diagnosis not present

## 2024-01-24 DIAGNOSIS — I5033 Acute on chronic diastolic (congestive) heart failure: Secondary | ICD-10-CM

## 2024-01-24 DIAGNOSIS — I4891 Unspecified atrial fibrillation: Secondary | ICD-10-CM

## 2024-01-24 DIAGNOSIS — Z87891 Personal history of nicotine dependence: Secondary | ICD-10-CM

## 2024-01-24 DIAGNOSIS — R509 Fever, unspecified: Secondary | ICD-10-CM | POA: Diagnosis not present

## 2024-01-24 DIAGNOSIS — Z96612 Presence of left artificial shoulder joint: Secondary | ICD-10-CM | POA: Diagnosis not present

## 2024-01-24 DIAGNOSIS — R918 Other nonspecific abnormal finding of lung field: Secondary | ICD-10-CM | POA: Diagnosis not present

## 2024-01-24 DIAGNOSIS — R0989 Other specified symptoms and signs involving the circulatory and respiratory systems: Secondary | ICD-10-CM | POA: Diagnosis not present

## 2024-01-24 HISTORY — PX: CARDIOVERSION: EP1203

## 2024-01-24 LAB — BASIC METABOLIC PANEL WITH GFR
Anion gap: 13 (ref 5–15)
BUN: 33 mg/dL — ABNORMAL HIGH (ref 8–23)
CO2: 27 mmol/L (ref 22–32)
Calcium: 8.4 mg/dL — ABNORMAL LOW (ref 8.9–10.3)
Chloride: 97 mmol/L — ABNORMAL LOW (ref 98–111)
Creatinine, Ser: 1.73 mg/dL — ABNORMAL HIGH (ref 0.61–1.24)
GFR, Estimated: 38 mL/min — ABNORMAL LOW (ref >60)
Glucose, Bld: 101 mg/dL — ABNORMAL HIGH (ref 70–99)
Potassium: 3.8 mmol/L (ref 3.5–5.1)
Sodium: 137 mmol/L (ref 135–145)

## 2024-01-24 LAB — CBC
HCT: 36.7 % — ABNORMAL LOW (ref 39.0–52.0)
Hemoglobin: 11.8 g/dL — ABNORMAL LOW (ref 13.0–17.0)
MCH: 30.2 pg (ref 26.0–34.0)
MCHC: 32.2 g/dL (ref 30.0–36.0)
MCV: 93.9 fL (ref 80.0–100.0)
Platelets: 353 K/uL (ref 150–400)
RBC: 3.91 MIL/uL — ABNORMAL LOW (ref 4.22–5.81)
RDW: 15.8 % — ABNORMAL HIGH (ref 11.5–15.5)
WBC: 12.5 K/uL — ABNORMAL HIGH (ref 4.0–10.5)
nRBC: 0 % (ref 0.0–0.2)

## 2024-01-24 LAB — ECHOCARDIOGRAM COMPLETE
AR max vel: 3.61 cm2
AV Area VTI: 4.61 cm2
AV Area mean vel: 3.47 cm2
AV Mean grad: 3 mmHg
AV Peak grad: 5.7 mmHg
Ao pk vel: 1.19 m/s
Area-P 1/2: 9.03 cm2
Calc EF: 65.4 %
Est EF: 55
MV VTI: 2.76 cm2
S' Lateral: 2.5 cm
Single Plane A2C EF: 64.8 %
Single Plane A4C EF: 65.9 %
Weight: 4871.28 [oz_av]

## 2024-01-24 LAB — URINALYSIS, ROUTINE W REFLEX MICROSCOPIC
Bacteria, UA: NONE SEEN
Bilirubin Urine: NEGATIVE
Glucose, UA: NEGATIVE mg/dL
Ketones, ur: NEGATIVE mg/dL
Leukocytes,Ua: NEGATIVE
Nitrite: NEGATIVE
Protein, ur: NEGATIVE mg/dL
Specific Gravity, Urine: 1.018 (ref 1.005–1.030)
pH: 5 (ref 5.0–8.0)

## 2024-01-24 MED ORDER — PHENYLEPHRINE 80 MCG/ML (10ML) SYRINGE FOR IV PUSH (FOR BLOOD PRESSURE SUPPORT)
PREFILLED_SYRINGE | INTRAVENOUS | Status: DC | PRN
Start: 2024-01-24 — End: 2024-01-24
  Administered 2024-01-24: 160 ug via INTRAVENOUS

## 2024-01-24 MED ORDER — SODIUM CHLORIDE 0.9 % IV SOLN
2.0000 g | INTRAVENOUS | Status: AC
  Administered 2024-01-24 – 2024-01-26 (×3): 2 g via INTRAVENOUS
  Filled 2024-01-24 (×4): qty 20

## 2024-01-24 MED ORDER — PERFLUTREN LIPID MICROSPHERE
1.0000 mL | INTRAVENOUS | Status: AC | PRN
  Administered 2024-01-24: 5 mL via INTRAVENOUS

## 2024-01-24 MED ORDER — PROPOFOL 10 MG/ML IV BOLUS
INTRAVENOUS | Status: DC | PRN
  Administered 2024-01-24: 70 mg via INTRAVENOUS

## 2024-01-24 MED ORDER — LIDOCAINE 2% (20 MG/ML) 5 ML SYRINGE
INTRAMUSCULAR | Status: DC | PRN
  Administered 2024-01-24: 60 mg via INTRAVENOUS

## 2024-01-24 NOTE — Discharge Instructions (Signed)

## 2024-01-24 NOTE — Anesthesia Postprocedure Evaluation (Signed)
 Anesthesia Post Note  Patient: Carlos Hanson  Procedure(s) Performed: CARDIOVERSION     Patient location during evaluation: PACU Anesthesia Type: General Level of consciousness: awake and alert Pain management: pain level controlled Vital Signs Assessment: post-procedure vital signs reviewed and stable Respiratory status: spontaneous breathing, nonlabored ventilation and respiratory function stable Cardiovascular status: blood pressure returned to baseline and stable Postop Assessment: no apparent nausea or vomiting Anesthetic complications: no   No notable events documented.  Last Vitals:  Vitals:   01/24/24 1310 01/24/24 1320  BP: (!) 121/99 118/83  Pulse: (!) 145 (!) 133  Resp: 20 20  Temp:    SpO2: 91% 93%    Last Pain:  Vitals:   01/24/24 1249  TempSrc: Temporal  PainSc: 0-No pain                 Butler Levander Pinal

## 2024-01-24 NOTE — Progress Notes (Addendum)
 Progress Note  Patient Name: Carlos Hanson Date of Encounter: 01/24/2024 Primary Cardiologist: Gordy Bergamo, MD   Subjective   Heart rate still elevated up into the 130s at times Continues to diurese well putting out 3.3 L yesterday and is net -5.8 L since admission  Vital Signs    Vitals:   01/23/24 2325 01/24/24 0400 01/24/24 0731 01/24/24 0913  BP: 100/78   110/80  Pulse: (!) 139  72 (!) 133  Resp:   16 20  Temp: (!) 102.3 F (39.1 C) 98.3 F (36.8 C) 100 F (37.8 C)   TempSrc: Oral Oral Axillary   SpO2:   97% 96%  Weight:        Intake/Output Summary (Last 24 hours) at 01/24/2024 9078 Last data filed at 01/24/2024 0533 Gross per 24 hour  Intake 586.47 ml  Output 3350 ml  Net -2763.53 ml   Filed Weights   01/23/24 1418  Weight: (!) 138.1 kg   (OH)  Physical Exam   GEN: Well nourished, well developed in no acute distress HEENT: Normal NECK: No JVD; No carotid bruits LYMPHATICS: No lymphadenopathy CARDIAC: Irregularly irregular and tachycardic, no murmurs, rubs, gallops RESPIRATORY:  Clear to auscultation without rales, wheezing or rhonchi  ABDOMEN: Soft, non-tender, non-distended MUSCULOSKELETAL:  No edema; No deformity  SKIN: Warm and dry NEUROLOGIC:  Alert and oriented x 3 PSYCHIATRIC:  Normal affect, Labs   Telemetry: Personally reviewed showing atrial fibrillation with RVR   Chemistry Recent Labs  Lab 01/22/24 0337 01/23/24 0250 01/24/24 0334  NA 140 140 137  K 4.0 4.3 3.8  CL 101 101 97*  CO2 27 26 27   GLUCOSE 98 100* 101*  BUN 32* 33* 33*  CREATININE 1.48* 1.58* 1.73*  CALCIUM  8.9 8.7* 8.4*  PROT 5.9*  --   --   ALBUMIN 2.4*  --   --   AST 13*  --   --   ALT 21  --   --   ALKPHOS 56  --   --   BILITOT 0.6  --   --   GFRNONAA 46* 43* 38*  ANIONGAP 12 13 13      Hematology Recent Labs  Lab 01/22/24 0337 01/23/24 0250 01/24/24 0334  WBC 13.2* 11.7* 12.5*  RBC 3.50* 3.92* 3.91*  HGB 10.8* 12.0* 11.8*  HCT 33.6* 37.5* 36.7*  MCV  96.0 95.7 93.9  MCH 30.9 30.6 30.2  MCHC 32.1 32.0 32.2  RDW 15.9* 15.9* 15.8*  PLT 385 328 353     Assessment & Plan   Atrial fibrillation RVR (duration clear) - Heart rate remains in the 130s on telemetry - No missed doses of Xarelto  in the past 3 weeks  - He is n.p.o. for cardioversion today   Acute on chronic diastolic CHF HF Phenotype with Baseline CKD III b - Admitted with volume overload in the setting of A-fib with RVR - Currently on Lasix  100 mg IV twice daily - UOP 3.3 L overnight and 5.8 L since admission; serum creatinine bumped from 1.58->>1.73 today - K+ 3.8 and mag 1.8 - echo is pending  - Given bump in serum creatinine and excellent urine output hold any further diuretics for today - check BMET in am   Morbid obesity OSA Hypotension has resolved - continuing with CPAP (at bedside), patient has morbid obesity, no smoking or alcohol  use  Reviewed with wife, who is pending an ablation on Wednesday with Dr. Kennyth; this may affect DC planning  I spent 30  minutes caring for this patient today face to face, ordering and reviewing labs, reviewing records from 2D echo from 2011, reviewing EKGs, seeing the patient, documenting in the record, and arranging for a 2D echo   Signed; Wilbert Bihari, MD, [facility] 01/24/2024

## 2024-01-24 NOTE — Progress Notes (Signed)
 PROGRESS NOTE Carlos Hanson  FMW:[id number] DOB: [birth year] DOA: 01/21/2024 PCP: Yolande Toribio [name], MD  Brief Narrative/Hospital Course: Carlos Hanson is a 83 y.o. male with PMH of paroxysmal A-fib on Xarelto , osteoarthritis, venous stasis insufficiency, chronic back pain, CAD, GERD, hiatal hernia, gout, history of bleeding PUD, hyperlipidemia, obstructive sleep apnea on CPAP, class II obesity and chronic constipation who presented to [facility] for evaluation of worsening leg swelling x 3 weeks. He was seen by PCP 2 days PTA and prescribed Lasix  and advised to continue elevation and compression, without improvement.  In the ED : HR initially in the 50s to 70s, SBP 110-130s, SpO2 96% on room air. Initial labs significant for BUN/creatinine 36/1.57, WBC 12.5 (from 25.36 months ago), Hgb 11.0, lactic acid 0.9, UA shows negative nitrite, moderate leuks, WBC 11-20 and many bacteria. Subsequently went into A-fib with RVR.,  Started on iV Cardizem  and due to soft BP,switched to IV amiodarone  and cardiology was consulted and admitted CXR shows no active disease. U/S LLE negative for DVT. S/p IV Rocephin .  UA with WBC 11-20 urine culture sent  Subjective: Seen and examined At bedside no fever overnight 102.3 last night mildly tachycardic on room air He has no complaint BP was soft on recheck improved. Labs shows WBC 12.5, creatinine slightly 1.7 Chest x-ray UA  and blood culture ordered.  Assessment and plan:  Paroxysmal A-fib with RVR: Remains poorly controlled.Cont Amio gtt, xarelto , cardiology following will likely need DCCV.  He is n.p.o. for cardioversion today per cardiology. TSH stable, echo pending-heart rate too high for echo.   Acute cystitis due to Klebsiella pneumoniae Fever overnight 9/21: Check UA chest x-ray blood culture.  He has been on IV ceftriaxone  since admission with Klebsiella in urine culture  Recent Labs  Lab 01/21/24 1228 01/21/24 1440 01/22/24 0337 01/23/24 0250  01/24/24 0334  WBC 12.5*  --  13.2* 11.7* 12.5*  LATICACIDVEN  --  0.9  --   --   --     Lower extremity edema Chronic venous insufficiency: Recently had leg swelling worsening x 3 weeks. Negative for DVT.Appreciate cardiology input continue high-dose Lasix  100 mg> held 9/22 due to bump in creatinine.Monitor intake output Daily weight.  Normocytic anemia: Mildly low, monitor daily cbc  CKD stage IIIb: Renal function is stable slightly uptrending but more or less same baseline ~ 1.6-1.9.  Given slight bump holding off on diuretics per cardiology Recent Labs    07/11/23 0837 07/11/23 1423 07/12/23 0432 01/21/24 1228 01/22/24 0337 01/23/24 0250 01/24/24 0334  BUN 47* 42* 39* 36* 32* 33* 33*  CREATININE 1.90* 1.85* 1.63* 1.57* 1.48* 1.58* 1.73*  CO2  --  19* 23 25 27 26 27   K 5.2* 4.7 4.9 5.1 4.0 4.3 3.8   HLD: Continue statin  Hypothyroidism: Is Euthyroid continue home Synthroid   Depression On chronic Aricept  Mood stable , continue  duloxetine , Aricept  gabapentin   GERD: Continue Pepcid   Chronic constipation: Continue lactulose /Senokot-S  OSA: Continue CPAP at bedtime  DVT prophylaxis: Place TED hose Start: 01/21/24 2316 Code Status:   Code Status: Full Code Family Communication: plan of care discussed with patient at bedside. Patient status is: Remains hospitalized because of severity of illness Level of care: Progressive   Dispo: The patient is from: home            Anticipated disposition: TBD Objective: Vitals last 24 hrs: Vitals:   01/23/24 2325 01/24/24 0400 01/24/24 0731 01/24/24 0913  BP: 100/78   110/80  Pulse: (!) 139  72 (!) 133  Resp:   16 20  Temp: (!) 102.3 F (39.1 C) 98.3 F (36.8 C) 100 F (37.8 C)   TempSrc: Oral Oral Axillary   SpO2:   97% 96%  Weight:        Physical Examination: General exam: Alert awake pleasant.  Hard of hearing  HEENT:Oral mucosa moist, Ear/Nose WNL grossly Respiratory system: Bilaterally clear BS,no use of  accessory muscle Cardiovascular system: S1 & S2 +, irregularly irregular, fast no JVD. Gastrointestinal system: Abdomen soft,NT,ND, BS+ Nervous System: Alert, awake, moving all extremities,and following commands. Extremities: extremities warm, leg edema ++ Skin: No rashes,no icterus. MSK: Normal muscle bulk,tone, power   Medications reviewed:  Scheduled Meds:  atorvastatin   20 mg Oral Daily   donepezil   5 mg Oral QHS   DULoxetine   60 mg Oral BID   famotidine   20 mg Oral Daily   gabapentin   600 mg Oral TID   lactulose   20 g Oral Daily   levothyroxine   50 mcg Oral QAC breakfast   melatonin  10 mg Oral QHS   multivitamin  1 tablet Oral Daily   Rivaroxaban   15 mg Oral Q supper   senna-docusate  1 tablet Oral QHS   Continuous Infusions:  sodium chloride  20 mL/hr at 01/23/24 2149   amiodarone  30 mg/hr (01/23/24 2335)   cefTRIAXone  (ROCEPHIN )  IV     Diet: Diet Order             Diet NPO time specified  Diet effective ____                   Data Reviewed: I have personally reviewed following labs and imaging studies ( see epic result tab) CBC: Recent Labs  Lab 01/21/24 1228 01/22/24 0337 01/23/24 0250 01/24/24 0334  WBC 12.5* 13.2* 11.7* 12.5*  HGB 11.0* 10.8* 12.0* 11.8*  HCT 34.3* 33.6* 37.5* 36.7*  MCV 96.9 96.0 95.7 93.9  PLT 355 385 328 353   CMP: Recent Labs  Lab 01/21/24 1228 01/22/24 0337 01/23/24 0250 01/24/24 0334  NA 142 140 140 137  K 5.1 4.0 4.3 3.8  CL 105 101 101 97*  CO2 25 27 26 27   GLUCOSE 118* 98 100* 101*  BUN 36* 32* 33* 33*  CREATININE 1.57* 1.48* 1.58* 1.73*  CALCIUM  9.8 8.9 8.7* 8.4*  MG  --  1.9 1.8  --    GFR: Estimated Creatinine Clearance: 45.1 mL/min (A) (by C-G formula based on SCr of 1.73 mg/dL (H)). Recent Labs  Lab 01/22/24 0337  AST 13*  ALT 21  ALKPHOS 56  BILITOT 0.6  PROT 5.9*  ALBUMIN 2.4*   No results for input(s): LIPASE, AMYLASE in the last 168 hours. No results for input(s): AMMONIA in the last 168  hours. Coagulation Profile: No results for input(s): INR, PROTIME in the last 168 hours. Unresulted Labs (From admission, onward)     Start     Ordered   01/24/24 0906  Urinalysis, Routine w reflex microscopic -Urine, Clean Catch  Once,   R       Question:  Specimen Source  Answer:  Urine, Clean Catch   01/24/24 0905   01/24/24 0906  Culture, blood (Routine X 2) w Reflex to ID Panel  BLOOD CULTURE X 2,   R      01/24/24 0905   01/23/24 0500  Basic metabolic panel with GFR  Daily,   R      01/22/24  9182   01/23/24 0500  CBC  Daily,   R      01/22/24 0817           Antimicrobials/Microbiology: Anti-infectives (From admission, onward)    Start     Dose/Rate Route Frequency Ordered Stop   01/24/24 1000  cefTRIAXone  (ROCEPHIN ) 2 g in sodium chloride  0.9 % 100 mL IVPB        2 g 200 mL/hr over 30 Minutes Intravenous Every 24 hours 01/24/24 0904     01/22/24 1800  cefTRIAXone  (ROCEPHIN ) 1 g in sodium chloride  0.9 % 100 mL IVPB  Status:  Discontinued        1 g 200 mL/hr over 30 Minutes Intravenous Every 24 hours 01/21/24 2327 01/24/24 0904   01/21/24 1630  cefTRIAXone  (ROCEPHIN ) 1 g in sodium chloride  0.9 % 100 mL IVPB        1 g 200 mL/hr over 30 Minutes Intravenous  Once 01/21/24 1627 01/21/24 1805         Component Value Date/Time   SDES  01/21/2024 1618    URINE, CLEAN CATCH Performed at [facility], [address], [place], [name] [address]    [facility]  01/21/2024 1618    NONE Performed at [facility], [address], [place], [name] [address]    CULT >=100,000 COLONIES/mL KLEBSIELLA PNEUMONIAE (A) 01/21/2024 1618   REPTSTATUS 01/23/2024 FINAL 01/21/2024 1618    Procedures: Procedure(s) (LRB): CARDIOVERSION (N/A)   Mennie LAMY, MD [facility] 01/24/2024, 10:41 AM

## 2024-01-24 NOTE — Plan of Care (Signed)

## 2024-01-24 NOTE — Transfer of Care (Signed)
 Immediate Anesthesia Transfer of Care Note  Patient: Carlos Hanson  Procedure(s) Performed: CARDIOVERSION  Patient Location: PACU and Cath Lab  Anesthesia Type:MAC  Level of Consciousness: drowsy  Airway & Oxygen Therapy: Patient Spontanous Breathing and Patient connected to nasal cannula oxygen  Post-op Assessment: Report given to RN and Post -op Vital signs reviewed and stable  Post vital signs: Reviewed and stable  Last Vitals:  Vitals Value Taken Time  BP 118/83 01/24/24 13:20  Temp 36.6 C 01/24/24 12:49  Pulse 125 01/24/24 13:20  Resp 19 01/24/24 13:20  SpO2 91 % 01/24/24 13:20  Vitals shown include unfiled device data.  Last Pain:  Vitals:   01/24/24 1249  TempSrc: Temporal  PainSc: 0-No pain         Complications: No notable events documented.

## 2024-01-24 NOTE — Anesthesia Preprocedure Evaluation (Addendum)
 Anesthesia Evaluation  Patient identified by MRN, date of birth, ID band Patient awake    Reviewed: Allergy & Precautions, NPO status , Patient's Chart, lab work & pertinent test results  Airway Mallampati: III  TM Distance: >3 FB Neck ROM: Full    Dental  (+) Edentulous Upper, Edentulous Lower, Dental Advisory Given   Pulmonary sleep apnea and Continuous Positive Airway Pressure Ventilation , former smoker   Pulmonary exam normal breath sounds clear to auscultation       Cardiovascular hypertension, Pt. on medications + CAD  Normal cardiovascular exam+ dysrhythmias (xarelto ) Atrial Fibrillation  Rhythm:Regular Rate:Normal     Neuro/Psych  PSYCHIATRIC DISORDERS  Depression    negative neurological ROS     GI/Hepatic Neg liver ROS, hiatal hernia,GERD  ,,  Endo/Other  Hypothyroidism  Class 3 obesity (BMI 43)  Renal/GU Renal InsufficiencyRenal disease  negative genitourinary   Musculoskeletal  (+) Arthritis ,    Abdominal   Peds  Hematology negative hematology ROS (+)   Anesthesia Other Findings 83 y.o. male with PMH of paroxysmal A-fib on Xarelto , osteoarthritis, venous stasis insufficiency, chronic back pain, CAD, GERD, hiatal hernia, gout, history of bleeding PUD, hyperlipidemia, obstructive sleep apnea on CPAP, class II obesity and chronic constipation who presented to [facility] for evaluation of worsening leg swelling x 3 weeks found to be in afib with RVR. On amio gtt.   Reproductive/Obstetrics                              Anesthesia Physical Anesthesia Plan  ASA: 3  Anesthesia Plan: General   Post-op Pain Management:    Induction: Intravenous  PONV Risk Score and Plan: Propofol  infusion and Treatment may vary due to age or medical condition  Airway Management Planned: Natural Airway  Additional Equipment:   Intra-op Plan:   Post-operative Plan:   Informed Consent: I  have reviewed the patients History and Physical, chart, labs and discussed the procedure including the risks, benefits and alternatives for the proposed anesthesia with the patient or authorized representative who has indicated his/her understanding and acceptance.     Dental advisory given  Plan Discussed with: CRNA  Anesthesia Plan Comments:         Anesthesia Quick Evaluation

## 2024-01-24 NOTE — H&P (View-Only) (Signed)
 Progress Note  Patient Name: Carlos Hanson Date of Encounter: 01/24/2024 Primary Cardiologist: Gordy Bergamo, MD   Subjective   Heart rate still elevated up into the 130s at times Continues to diurese well putting out 3.3 L yesterday and is net -5.8 L since admission  Vital Signs    Vitals:   01/23/24 2325 01/24/24 0400 01/24/24 0731 01/24/24 0913  BP: 100/78   110/80  Pulse: (!) 139  72 (!) 133  Resp:   16 20  Temp: (!) 102.3 F (39.1 C) 98.3 F (36.8 C) 100 F (37.8 C)   TempSrc: Oral Oral Axillary   SpO2:   97% 96%  Weight:        Intake/Output Summary (Last 24 hours) at 01/24/2024 9078 Last data filed at 01/24/2024 0533 Gross per 24 hour  Intake 586.47 ml  Output 3350 ml  Net -2763.53 ml   Filed Weights   01/23/24 1418  Weight: (!) 138.1 kg   (OH)  Physical Exam   GEN: Well nourished, well developed in no acute distress HEENT: Normal NECK: No JVD; No carotid bruits LYMPHATICS: No lymphadenopathy CARDIAC: Irregularly irregular and tachycardic, no murmurs, rubs, gallops RESPIRATORY:  Clear to auscultation without rales, wheezing or rhonchi  ABDOMEN: Soft, non-tender, non-distended MUSCULOSKELETAL:  No edema; No deformity  SKIN: Warm and dry NEUROLOGIC:  Alert and oriented x 3 PSYCHIATRIC:  Normal affect, Labs   Telemetry: Personally reviewed showing atrial fibrillation with RVR   Chemistry Recent Labs  Lab 01/22/24 0337 01/23/24 0250 01/24/24 0334  NA 140 140 137  K 4.0 4.3 3.8  CL 101 101 97*  CO2 27 26 27   GLUCOSE 98 100* 101*  BUN 32* 33* 33*  CREATININE 1.48* 1.58* 1.73*  CALCIUM  8.9 8.7* 8.4*  PROT 5.9*  --   --   ALBUMIN 2.4*  --   --   AST 13*  --   --   ALT 21  --   --   ALKPHOS 56  --   --   BILITOT 0.6  --   --   GFRNONAA 46* 43* 38*  ANIONGAP 12 13 13      Hematology Recent Labs  Lab 01/22/24 0337 01/23/24 0250 01/24/24 0334  WBC 13.2* 11.7* 12.5*  RBC 3.50* 3.92* 3.91*  HGB 10.8* 12.0* 11.8*  HCT 33.6* 37.5* 36.7*  MCV  96.0 95.7 93.9  MCH 30.9 30.6 30.2  MCHC 32.1 32.0 32.2  RDW 15.9* 15.9* 15.8*  PLT 385 328 353     Assessment & Plan   Atrial fibrillation RVR (duration clear) - Heart rate remains in the 130s on telemetry - No missed doses of Xarelto  in the past 3 weeks  - He is n.p.o. for cardioversion today   Acute on chronic diastolic CHF HF Phenotype with Baseline CKD III b - Admitted with volume overload in the setting of A-fib with RVR - Currently on Lasix  100 mg IV twice daily - UOP 3.3 L overnight and 5.8 L since admission; serum creatinine bumped from 1.58->>1.73 today - K+ 3.8 and mag 1.8 - echo is pending  - Given bump in serum creatinine and excellent urine output hold any further diuretics for today - check BMET in am   Morbid obesity OSA Hypotension has resolved - continuing with CPAP (at bedside), patient has morbid obesity, no smoking or alcohol  use  Reviewed with wife, who is pending an ablation on Wednesday with Dr. Kennyth; this may affect DC planning  I spent 30  minutes caring for this patient today face to face, ordering and reviewing labs, reviewing records from 2D echo from 2011, reviewing EKGs, seeing the patient, documenting in the record, and arranging for a 2D echo   Signed; Wilbert Bihari, MD, [facility] 01/24/2024

## 2024-01-24 NOTE — Interval H&P Note (Signed)
 History and Physical Interval Note:  01/24/2024 12:28 PM  Carlos Hanson  has presented today for surgery, with the diagnosis of afib.  The various methods of treatment have been discussed with the patient and family. After consideration of risks, benefits and other options for treatment, the patient has consented to  Procedure(s): CARDIOVERSION (N/A) as a surgical intervention.  The patient's history has been reviewed, patient examined, no change in status, stable for surgery.  I have reviewed the patient's chart and labs.  Questions were answered to the patient's satisfaction.     Carlos Hanson

## 2024-01-24 NOTE — CV Procedure (Signed)
 Procedure:   DCCV  Indication:  Symptomatic atrial fibrillation  Procedure Note:  The patient signed informed consent.  They have had had therapeutic anticoagulation with Xarelto  greater than 3 weeks.  Anesthesia was administered by Dr. Cleotilde and Jackee Peng, CRNA.  Adequate airway was maintained throughout and vital followed per protocol.  They were cardioverted x 3 with 200, 300, then 360J of biphasic synchronized energy.  Cardioversion was not successful.  After first shock at 200J, remained in Afib.  For second shock at 300J, pressure was applied to anterior pad; patient had a few beats of sinus rhythm following shock but then went back into Afib.  After 3rd shock at 360J, patient again had a few beats of sinus rhythm but then went back into Afib.  There were no apparent complications.  The patient had normal neuro status and respiratory status post procedure with vitals stable as recorded elsewhere.  Did require phenylephrine  for hypotension.  Follow up:  They will continue on current medical therapy and follow up with cardiology as scheduled.  Lonni Nanas, MD 01/24/2024 12:47 PM

## 2024-01-25 ENCOUNTER — Inpatient Hospital Stay (HOSPITAL_COMMUNITY)

## 2024-01-25 ENCOUNTER — Encounter (HOSPITAL_COMMUNITY): Payer: Self-pay | Admitting: Cardiology

## 2024-01-25 ENCOUNTER — Other Ambulatory Visit: Payer: Self-pay

## 2024-01-25 DIAGNOSIS — I4891 Unspecified atrial fibrillation: Secondary | ICD-10-CM | POA: Diagnosis not present

## 2024-01-25 DIAGNOSIS — N1832 Chronic kidney disease, stage 3b: Secondary | ICD-10-CM | POA: Diagnosis not present

## 2024-01-25 DIAGNOSIS — I5033 Acute on chronic diastolic (congestive) heart failure: Secondary | ICD-10-CM | POA: Diagnosis not present

## 2024-01-25 DIAGNOSIS — G4733 Obstructive sleep apnea (adult) (pediatric): Secondary | ICD-10-CM | POA: Diagnosis not present

## 2024-01-25 LAB — BASIC METABOLIC PANEL WITH GFR
Anion gap: 11 (ref 5–15)
BUN: 40 mg/dL — ABNORMAL HIGH (ref 8–23)
CO2: 28 mmol/L (ref 22–32)
Calcium: 8.3 mg/dL — ABNORMAL LOW (ref 8.9–10.3)
Chloride: 97 mmol/L — ABNORMAL LOW (ref 98–111)
Creatinine, Ser: 1.68 mg/dL — ABNORMAL HIGH (ref 0.61–1.24)
GFR, Estimated: 40 mL/min — ABNORMAL LOW
Glucose, Bld: 111 mg/dL — ABNORMAL HIGH (ref 70–99)
Potassium: 3.8 mmol/L (ref 3.5–5.1)
Sodium: 136 mmol/L (ref 135–145)

## 2024-01-25 LAB — CBC
HCT: 36.1 % — ABNORMAL LOW (ref 39.0–52.0)
Hemoglobin: 11.7 g/dL — ABNORMAL LOW (ref 13.0–17.0)
MCH: 30.5 pg (ref 26.0–34.0)
MCHC: 32.4 g/dL (ref 30.0–36.0)
MCV: 94.3 fL (ref 80.0–100.0)
Platelets: 329 K/uL (ref 150–400)
RBC: 3.83 MIL/uL — ABNORMAL LOW (ref 4.22–5.81)
RDW: 15.6 % — ABNORMAL HIGH (ref 11.5–15.5)
WBC: 12.3 K/uL — ABNORMAL HIGH (ref 4.0–10.5)
nRBC: 0 % (ref 0.0–0.2)

## 2024-01-25 LAB — URIC ACID: Uric Acid, Serum: 7.7 mg/dL (ref 3.7–8.6)

## 2024-01-25 LAB — SEDIMENTATION RATE: Sed Rate: 118 mm/h — ABNORMAL HIGH (ref 0–16)

## 2024-01-25 MED ORDER — POLYETHYLENE GLYCOL 3350 17 G PO PACK: 17.0000 g | PACK | Freq: Every day | ORAL | Status: DC | PRN

## 2024-01-25 MED ORDER — DOXYCYCLINE HYCLATE 100 MG PO TABS
100.0000 mg | ORAL_TABLET | Freq: Two times a day (BID) | ORAL | Status: AC
Start: 2024-01-25 — End: 2024-01-29
  Administered 2024-01-25 – 2024-01-29 (×10): 100 mg via ORAL
  Filled 2024-01-25 (×11): qty 1

## 2024-01-25 MED ORDER — RIVAROXABAN 20 MG PO TABS
20.0000 mg | ORAL_TABLET | Freq: Every day | ORAL | Status: DC
  Administered 2024-01-25 – 2024-02-02 (×9): 20 mg via ORAL
  Filled 2024-01-25 (×9): qty 1

## 2024-01-25 MED ORDER — SODIUM CHLORIDE 0.9% FLUSH: 10.0000 mL | INTRAVENOUS | Status: DC | PRN

## 2024-01-25 MED ORDER — AMIODARONE IV BOLUS ONLY 150 MG/100ML
150.0000 mg | Freq: Once | INTRAVENOUS | Status: AC
  Administered 2024-01-25: 150 mg via INTRAVENOUS
  Filled 2024-01-25: qty 100

## 2024-01-25 MED ORDER — CHLORHEXIDINE GLUCONATE CLOTH 2 % EX PADS
6.0000 | MEDICATED_PAD | Freq: Every day | CUTANEOUS | Status: DC
  Administered 2024-01-25 – 2024-02-03 (×10): 6 via TOPICAL

## 2024-01-25 MED ORDER — HYDROCODONE-ACETAMINOPHEN 5-325 MG PO TABS
1.0000 | ORAL_TABLET | Freq: Four times a day (QID) | ORAL | Status: DC | PRN
  Administered 2024-01-25 – 2024-01-28 (×3): 1 via ORAL
  Filled 2024-01-25 (×3): qty 1

## 2024-01-25 MED ORDER — SODIUM CHLORIDE 0.9% FLUSH
10.0000 mL | Freq: Two times a day (BID) | INTRAVENOUS | Status: DC
  Administered 2024-01-25: 10 mL
  Administered 2024-01-27: 20 mL
  Administered 2024-01-27: 10 mL
  Administered 2024-01-28: 20 mL
  Administered 2024-01-28 – 2024-02-03 (×10): 10 mL

## 2024-01-25 NOTE — Progress Notes (Signed)
 Progress Note  Patient Name: Carlos Hanson Date of Encounter: 01/25/2024 Primary Cardiologist: Gordy Bergamo, MD   Subjective   Santina down for cardioversion yesterday but each time he was shocked he went right back into A-fib despite being on IV Amio.    Heart rates remain in the 130s Denies any CP or SOB.  Vital Signs    Vitals:   01/24/24 2000 01/24/24 2347 01/25/24 0401 01/25/24 0800  BP: 110/86 (!) 108/93 115/72 (!) 120/103  Pulse: (!) 144 71 76 74  Resp: 20 (!) 24 20 (!) 22  Temp: 98.4 F (36.9 C) 99.2 F (37.3 C) 99 F (37.2 C)   TempSrc: Oral Oral Oral   SpO2: 95% 95% 94% 95%  Weight:        Intake/Output Summary (Last 24 hours) at 01/25/2024 0900 Last data filed at 01/25/2024 0435 Gross per 24 hour  Intake 214.36 ml  Output 1900 ml  Net -1685.64 ml   Filed Weights   01/23/24 1418  Weight: (!) 138.1 kg   (OH)  Physical Exam   GEN: Well nourished, well developed in no acute distress HEENT: Normal NECK: No JVD; No carotid bruits LYMPHATICS: No lymphadenopathy CARDIAC: Irregularly irregular and tachycardic, no murmurs, rubs, gallops RESPIRATORY:  Clear to auscultation without rales, wheezing or rhonchi  ABDOMEN: Soft, non-tender, non-distended MUSCULOSKELETAL:  No edema; No deformity  SKIN: Warm and dry NEUROLOGIC:  Alert and oriented x 3 PSYCHIATRIC:  Normal affect  Labs   Telemetry: Personally reviewed showing atrial fibrillation with RVR   Chemistry Recent Labs  Lab 01/22/24 0337 01/23/24 0250 01/24/24 0334 01/25/24 0301  NA 140 140 137 136  K 4.0 4.3 3.8 3.8  CL 101 101 97* 97*  CO2 27 26 27 28   GLUCOSE 98 100* 101* 111*  BUN 32* 33* 33* 40*  CREATININE 1.48* 1.58* 1.73* 1.68*  CALCIUM  8.9 8.7* 8.4* 8.3*  PROT 5.9*  --   --   --   ALBUMIN 2.4*  --   --   --   AST 13*  --   --   --   ALT 21  --   --   --   ALKPHOS 56  --   --   --   BILITOT 0.6  --   --   --   GFRNONAA 46* 43* 38* 40*  ANIONGAP 12 13 13 11      Hematology Recent Labs   Lab 01/23/24 0250 01/24/24 0334 01/25/24 0301  WBC 11.7* 12.5* 12.3*  RBC 3.92* 3.91* 3.83*  HGB 12.0* 11.8* 11.7*  HCT 37.5* 36.7* 36.1*  MCV 95.7 93.9 94.3  MCH 30.6 30.2 30.5  MCHC 32.0 32.2 32.4  RDW 15.9* 15.8* 15.6*  PLT 328 353 329     Assessment & Plan   Atrial fibrillation RVR (duration clear) - Status post cardioversion yesterday but went right back into A-fib .  He was cardioverted 3 times but unfortunately did not maintain sinus rhythm - Remains in A-fib with RVR in the 130s this morning - Will rebolus with Amio 150 mg IV and increase Amio drip to 60 mg/h - will plan repeat DCCV tomorrow after getting an additional 48 hours of Amio since last DCCV - if he fails DCCV then may need AVN ablation with PPM - NPO after MN  Acute on chronic diastolic CHF HF Phenotype with Baseline CKD III b AKI - Admitted with volume overload in the setting of A-fib with RVR - diuretics on hold  for AKI - UOP 1.9 L overnight and 7.5 L since admission; serum creatinine 1.58->>1.73>> 1.68 today - K+ 3.8 today - 2D echo: EF 55% with mild LVH, mild RV dysfunction, mild RAE, trivial MR and small pericardial effusion - Serum creatinine improving after holding diuretics - Appears euvolemic on exam so we will continue to hold diuretics for now until renal function normalizes   Morbid obesity OSA Hypotension has resolved - continuing with CPAP (at bedside), patient has morbid obesity, no smoking or alcohol  use  I spent 30 minutes caring for this patient today face to face, ordering and reviewing labs, reviewing records from 2D echo from 2011, reviewing EKGs and cardioversion note yesterday, seeing the patient, documenting in the record, and arranging for repeat DCCV   Signed; Wilbert Bihari, MD, [facility] 01/24/2024

## 2024-01-25 NOTE — Plan of Care (Signed)
  Problem: Clinical Measurements: Goal: Diagnostic test results will improve Outcome: Progressing   Problem: Activity: Goal: Risk for activity intolerance will decrease Outcome: Progressing   Problem: Nutrition: Goal: Adequate nutrition will be maintained Outcome: Progressing   Problem: Pain Managment: Goal: General experience of comfort will improve and/or be controlled Outcome: Progressing

## 2024-01-25 NOTE — Progress Notes (Signed)
 PROGRESS NOTE [facility]NA [facility]IV[facility]  FMW:[id number] DOB: February 26, 1941 DOA: 01/21/2024 PCP: Yolande Toribio [name], MD  Brief Narrative/Hospital Course: STACY DESHLER is a 83 y.o. male with PMH of paroxysmal A-fib on Xarelto , osteoarthritis, venous stasis insufficiency, chronic back pain, CAD, GERD, hiatal hernia, gout, history of bleeding PUD, hyperlipidemia, obstructive sleep apnea on CPAP, class II obesity and chronic constipation who presented to [facility] for evaluation of worsening leg swelling x 3 weeks. He was seen by PCP 2 days PTA and prescribed Lasix  and advised to continue elevation and compression, without improvement.  In the ED : HR initially in the 50s to 70s, SBP 110-130s, SpO2 96% on room air. Initial labs significant for BUN/creatinine 36/1.57, WBC 12.5 (from 25.36 months ago), Hgb 11.0, lactic acid 0.9, UA shows negative nitrite, moderate leuks, WBC 11-20 and many bacteria. Subsequently went into A-fib with RVR.,  Started on iV Cardizem  and due to soft BP,switched to IV amiodarone  and cardiology was consulted and admitted CXR shows no active disease. U/S LLE negative for DVT. S/p IV Rocephin .  UA with WBC 11-20 urine culture sent Patient continued on amiodarone  drip seen by cardiology 9/22: Underwent cardioversion x 3 without success  Subjective: Seen and examined Overnight heart rate remains poorly controlled 130s-150s, in A-fib Complains of right wrist pain and finger swelling Labs reviewed creatinine further down 1.6 mild leukocytosis  Assessment and plan:  Paroxysmal A-fib with RVR: Remains poorly controlled.9/22: Underwent cardioversion x 3 without success. Cont Amio, xarelto , and further plan as per cardiology.  CONT TO Monitor in tele TSH stable, echo 9/22 shows EF 55% RV SF mildly reduced pulmonary artery systolic pressure normal   Acute cystitis due to Klebsiella pneumoniae Fever overnight 9/21: 9/22 recheck UA normal, chest x-ray with low lung volume question left lower lobe  airspace disease, has been afebrile continue antibiotics empirically on doxycycline  question for pneumonia.  Blood culture pending from 9/22 Recent Labs  Lab 01/21/24 1228 01/21/24 1440 01/22/24 0337 01/23/24 0250 01/24/24 0334 01/25/24 0301  WBC 12.5*  --  13.2* 11.7* 12.5* 12.3*  LATICACIDVEN  --  0.9  --   --   --   --     Lower extremity edema Chronic venous insufficiency: Recently had leg swelling worsening x 3 weeks. Negative for DVT.Appreciate cardiology input, received high-dose Lasix , Lasix  stopped 9/22 due to slightly uptrending creatinine.  Creatinine stable today.  Net IO Since Admission: -7,502.42 mL [01/25/24 0824]   Wrist pain acute on chronic: Will check x-ray of the right wrist he has had similar issues in the past.  Check CRP ESR RF, uric acid Added pain control with hydrocodone .  Normocytic anemia: Mildly low, monitor daily cbc  CKD stage IIIb: Renal function is stable, monitor.baseline ~ 1.6-1.9. Recent Labs    07/11/23 0837 07/11/23 1423 07/12/23 0432 01/21/24 1228 01/22/24 0337 01/23/24 0250 01/24/24 0334 01/25/24 0301  BUN 47* 42* 39* 36* 32* 33* 33* 40*  CREATININE 1.90* 1.85* 1.63* 1.57* 1.48* 1.58* 1.73* 1.68*  CO2  --  19* 23 25 27 26 27 28   K 5.2* 4.7 4.9 5.1 4.0 4.3 3.8 3.8   HLD: Continue statin  Hypothyroidism: Is Euthyroid continue home Synthroid   Depression On chronic Aricept  Mood stable , continue  duloxetine , Aricept  gabapentin   GERD: Continue Pepcid   Chronic constipation: Continue lactulose /Senokot-S  OSA: Continue CPAP at bedtime  DVT prophylaxis: Place TED hose Start: 01/21/24 2316 Code Status:   Code Status: Full Code Family Communication: plan of care discussed with patient  at bedside. Patient status is: Remains hospitalized because of severity of illness Level of care: Progressive   Dispo: The patient is from: home            Anticipated disposition: TBD Objective: Vitals last 24 hrs: Vitals:   01/24/24  1820 01/24/24 2000 01/24/24 2347 01/25/24 0401  BP:  110/86 (!) 108/93 115/72  Pulse: (!) 146 (!) 144 71 76  Resp: 17 20 (!) 24 20  Temp:  98.4 F (36.9 C) 99.2 F (37.3 C) 99 F (37.2 C)  TempSrc:  Oral Oral Oral  SpO2: 94% 95% 95% 94%  Weight:        Physical Examination: General exam: AAOX3, HOH HEENT:Oral mucosa moist, Ear/Nose WNL grossly Respiratory system: Bilaterally clear BS,no use of accessory muscle Cardiovascular system: S1 & S2 +, irregularly irregular, fast no JVD. Gastrointestinal system: Abdomen soft,NT,ND, BS+ Nervous System: Alert, awake, moving all extremities,and following commands. Extremities: extremities warm, leg edema ++, tender right wrist no erythema Skin: No rashes,no icterus. MSK: Normal muscle bulk,tone, power   Medications reviewed:  Scheduled Meds:  atorvastatin   20 mg Oral Daily   donepezil   5 mg Oral QHS   DULoxetine   60 mg Oral BID   famotidine   20 mg Oral Daily   gabapentin   600 mg Oral TID   lactulose   20 g Oral Daily   levothyroxine   50 mcg Oral QAC breakfast   melatonin  10 mg Oral QHS   multivitamin  1 tablet Oral Daily   Rivaroxaban   15 mg Oral Q supper   senna-docusate  1 tablet Oral QHS   Continuous Infusions:  amiodarone  30 mg/hr (01/24/24 2346)   cefTRIAXone  (ROCEPHIN )  IV 2 g (01/24/24 1134)   Diet: Diet Order             Diet Heart Room service appropriate? Yes; Fluid consistency: Thin  Diet effective now                   Data Reviewed: I have personally reviewed following labs and imaging studies ( see epic result tab) CBC: Recent Labs  Lab 01/21/24 1228 01/22/24 0337 01/23/24 0250 01/24/24 0334 01/25/24 0301  WBC 12.5* 13.2* 11.7* 12.5* 12.3*  HGB 11.0* 10.8* 12.0* 11.8* 11.7*  HCT 34.3* 33.6* 37.5* 36.7* 36.1*  MCV 96.9 96.0 95.7 93.9 94.3  PLT 355 385 328 353 329   CMP: Recent Labs  Lab 01/21/24 1228 01/22/24 0337 01/23/24 0250 01/24/24 0334 01/25/24 0301  NA 142 140 140 137 136  K 5.1 4.0  4.3 3.8 3.8  CL 105 101 101 97* 97*  CO2 25 27 26 27 28   GLUCOSE 118* 98 100* 101* 111*  BUN 36* 32* 33* 33* 40*  CREATININE 1.57* 1.48* 1.58* 1.73* 1.68*  CALCIUM  9.8 8.9 8.7* 8.4* 8.3*  MG  --  1.9 1.8  --   --    GFR: Estimated Creatinine Clearance: 46.5 mL/min (A) (by C-G formula based on SCr of 1.68 mg/dL (H)). Recent Labs  Lab 01/22/24 0337  AST 13*  ALT 21  ALKPHOS 56  BILITOT 0.6  PROT 5.9*  ALBUMIN 2.4*   No results for input(s): LIPASE, AMYLASE in the last 168 hours. No results for input(s): AMMONIA in the last 168 hours. Coagulation Profile: No results for input(s): INR, PROTIME in the last 168 hours. Unresulted Labs (From admission, onward)     Start     Ordered   01/23/24 0500  Basic metabolic panel with GFR  Daily,   R      01/22/24 0817   01/23/24 0500  CBC  Daily,   R      01/22/24 0817           Antimicrobials/Microbiology: Anti-infectives (From admission, onward)    Start     Dose/Rate Route Frequency Ordered Stop   01/25/24 1000  doxycycline  (VIBRA -TABS) tablet 100 mg        100 mg Oral Every 12 hours 01/25/24 0824 01/30/24 0959   01/24/24 1000  cefTRIAXone  (ROCEPHIN ) 2 g in sodium chloride  0.9 % 100 mL IVPB        2 g 200 mL/hr over 30 Minutes Intravenous Every 24 hours 01/24/24 0904     01/22/24 1800  cefTRIAXone  (ROCEPHIN ) 1 g in sodium chloride  0.9 % 100 mL IVPB  Status:  Discontinued        1 g 200 mL/hr over 30 Minutes Intravenous Every 24 hours 01/21/24 2327 01/24/24 0904   01/21/24 1630  cefTRIAXone  (ROCEPHIN ) 1 g in sodium chloride  0.9 % 100 mL IVPB        1 g 200 mL/hr over 30 Minutes Intravenous  Once 01/21/24 1627 01/21/24 1805         Component Value Date/Time   SDES BLOOD RIGHT ARM 01/24/2024 0939   SDES BLOOD RIGHT ARM 01/24/2024 0939   SPECREQUEST  01/24/2024 0939    BOTTLES DRAWN AEROBIC AND ANAEROBIC Blood Culture results may not be optimal due to an inadequate volume of blood received in culture bottles    SPECREQUEST  01/24/2024 0939    BOTTLES DRAWN AEROBIC AND ANAEROBIC Blood Culture adequate volume   CULT  01/24/2024 0939    NO GROWTH < 24 HOURS Performed at [facility], [address], [place], [name] [address]    CULT  01/24/2024 [phone]    NO GROWTH < 24 HOURS Performed at [facility], [address] [place], [name] [address]    REPTSTATUS PENDING 01/24/2024 9060   REPTSTATUS PENDING 01/24/2024 9060    Procedures: Procedure(s) (LRB): CARDIOVERSION (N/A)   Mennie LAMY, MD [facility] 01/25/2024, 10:24 AM

## 2024-01-25 NOTE — H&P (View-Only) (Signed)
 Progress Note  Patient Name: Carlos Hanson Date of Encounter: 01/25/2024 Primary Cardiologist: Gordy Bergamo, MD   Subjective   Santina down for cardioversion yesterday but each time he was shocked he went right back into A-fib despite being on IV Amio.    Heart rates remain in the 130s Denies any CP or SOB.  Vital Signs    Vitals:   01/24/24 2000 01/24/24 2347 01/25/24 0401 01/25/24 0800  BP: 110/86 (!) 108/93 115/72 (!) 120/103  Pulse: (!) 144 71 76 74  Resp: 20 (!) 24 20 (!) 22  Temp: 98.4 F (36.9 C) 99.2 F (37.3 C) 99 F (37.2 C)   TempSrc: Oral Oral Oral   SpO2: 95% 95% 94% 95%  Weight:        Intake/Output Summary (Last 24 hours) at 01/25/2024 0900 Last data filed at 01/25/2024 0435 Gross per 24 hour  Intake 214.36 ml  Output 1900 ml  Net -1685.64 ml   Filed Weights   01/23/24 1418  Weight: (!) 138.1 kg   (OH)  Physical Exam   GEN: Well nourished, well developed in no acute distress HEENT: Normal NECK: No JVD; No carotid bruits LYMPHATICS: No lymphadenopathy CARDIAC: Irregularly irregular and tachycardic, no murmurs, rubs, gallops RESPIRATORY:  Clear to auscultation without rales, wheezing or rhonchi  ABDOMEN: Soft, non-tender, non-distended MUSCULOSKELETAL:  No edema; No deformity  SKIN: Warm and dry NEUROLOGIC:  Alert and oriented x 3 PSYCHIATRIC:  Normal affect  Labs   Telemetry: Personally reviewed showing atrial fibrillation with RVR   Chemistry Recent Labs  Lab 01/22/24 0337 01/23/24 0250 01/24/24 0334 01/25/24 0301  NA 140 140 137 136  K 4.0 4.3 3.8 3.8  CL 101 101 97* 97*  CO2 27 26 27 28   GLUCOSE 98 100* 101* 111*  BUN 32* 33* 33* 40*  CREATININE 1.48* 1.58* 1.73* 1.68*  CALCIUM  8.9 8.7* 8.4* 8.3*  PROT 5.9*  --   --   --   ALBUMIN 2.4*  --   --   --   AST 13*  --   --   --   ALT 21  --   --   --   ALKPHOS 56  --   --   --   BILITOT 0.6  --   --   --   GFRNONAA 46* 43* 38* 40*  ANIONGAP 12 13 13 11      Hematology Recent Labs   Lab 01/23/24 0250 01/24/24 0334 01/25/24 0301  WBC 11.7* 12.5* 12.3*  RBC 3.92* 3.91* 3.83*  HGB 12.0* 11.8* 11.7*  HCT 37.5* 36.7* 36.1*  MCV 95.7 93.9 94.3  MCH 30.6 30.2 30.5  MCHC 32.0 32.2 32.4  RDW 15.9* 15.8* 15.6*  PLT 328 353 329     Assessment & Plan   Atrial fibrillation RVR (duration clear) - Status post cardioversion yesterday but went right back into A-fib .  He was cardioverted 3 times but unfortunately did not maintain sinus rhythm - Remains in A-fib with RVR in the 130s this morning - Will rebolus with Amio 150 mg IV and increase Amio drip to 60 mg/h - will plan repeat DCCV tomorrow after getting an additional 48 hours of Amio since last DCCV - if he fails DCCV then may need AVN ablation with PPM - NPO after MN  Acute on chronic diastolic CHF HF Phenotype with Baseline CKD III b AKI - Admitted with volume overload in the setting of A-fib with RVR - diuretics on hold  for AKI - UOP 1.9 L overnight and 7.5 L since admission; serum creatinine 1.58->>1.73>> 1.68 today - K+ 3.8 today - 2D echo: EF 55% with mild LVH, mild RV dysfunction, mild RAE, trivial MR and small pericardial effusion - Serum creatinine improving after holding diuretics - Appears euvolemic on exam so we will continue to hold diuretics for now until renal function normalizes   Morbid obesity OSA Hypotension has resolved - continuing with CPAP (at bedside), patient has morbid obesity, no smoking or alcohol  use  I spent 30 minutes caring for this patient today face to face, ordering and reviewing labs, reviewing records from 2D echo from 2011, reviewing EKGs and cardioversion note yesterday, seeing the patient, documenting in the record, and arranging for repeat DCCV   Signed; Wilbert Bihari, MD, [facility] 01/24/2024

## 2024-01-25 NOTE — TOC Initial Note (Signed)
 Transition of Care (TOC) - Initial/Assessment Note  Rayfield Gobble RN, BSN Inpatient Care Management Unit 4E- RN Case Manager See Treatment Team for direct phone #   Patient Details  Name: Carlos Hanson MRN: [id number] Date of Birth: 13-Jul-1940  Transition of Care [facility]) CM/SW Contact:    Gobble Rayfield Hurst, RN Phone Number: 01/25/2024, 11:33 AM  Clinical Narrative:                 Received call from wife that she would like to speak with CM today.   CM went to bedside to speak to pt, wife and son who was also present.  Wife shared that they live at [facility], and that pt has not been able to ambulate on his own over the last couple of weeks, pt has also had multiple falls- last one being on 9/19. Wife and son are concerned about plan for discharge.  In further conversation, pt has lift chair, 3-wheeled walker, rollator, and grab bars in shower. They are also looking into private caregiver support. Wife also states they have LT care insurance which they will look into activating as wife voiced they know it has a 42mo waiting period.   Will ask MD for PT/OT evals - discussed possible recommendations for [facility] or [facility] for rehab. Per wife if pt needs rehab they would like to see if [facility] has availability in their SNF side as first choice.  CM will follow up for therapy recommendations to assist in planning safest discharge.   Questions answered that pt/wife/son have at this time, CM will follow up once PT/OT see pt and make recommendations. Wife has this CM contact info should they have further questions in the meantime.   Expected Discharge Plan:  (TBD) Barriers to Discharge: Continued Medical Work up   Patient Goals and CMS Choice Patient states their goals for this hospitalization and ongoing recovery are:: to get stronger and be able to return home   Choice offered to / list presented to : Patient, Spouse, Adult Children      Expected Discharge Plan and Services    Discharge Planning Services: CM Consult Post Acute Care Choice: Home Health Living arrangements for the past 2 months: Apartment, Independent Living Facility                                      Prior Living Arrangements/Services Living arrangements for the past 2 months: Apartment, Independent Living Facility Lives with:: Spouse Patient language and need for interpreter reviewed:: Yes        Need for Family Participation in Patient Care: Yes (Comment) Care giver support system in place?: Yes (comment)   Criminal Activity/Legal Involvement Pertinent to Current Situation/Hospitalization: No - Comment as needed  Activities of Daily Living   ADL Screening (condition at time of admission) Independently performs ADLs?: Yes (appropriate for developmental age) Is the patient deaf or have difficulty hearing?: No Does the patient have difficulty seeing, even when wearing glasses/contacts?: No Does the patient have difficulty concentrating, remembering, or making decisions?: No  Permission Sought/Granted                  Emotional Assessment Appearance:: Appears stated age Attitude/Demeanor/Rapport: Engaged Affect (typically observed): Calm Orientation: : Oriented to Self, Oriented to Place, Oriented to  Time, Oriented to Situation Alcohol  / Substance Use: Not Applicable Psych Involvement: No (comment)  Admission diagnosis:  Atrial  fibrillation with rapid ventricular response (HCC) [I48.91] Atrial fibrillation, rapid (HCC) [I48.91] Bilateral lower extremity edema [R60.0] Urinary tract infection with hematuria, site unspecified [N39.0, R31.9] Patient Active Problem List   Diagnosis Date Noted   Acute on chronic diastolic CHF (congestive heart failure) (HCC) 01/25/2024   CKD stage 3b, GFR 30-44 ml/min (HCC) 01/25/2024   Atrial fibrillation with rapid ventricular response (HCC) 01/21/2024   Bilateral lower extremity edema 01/21/2024   Chronic venous insufficiency of  lower extremity 01/21/2024   Chronic low back pain 01/21/2024   Acute respiratory failure with hypoxia (HCC) 07/12/2023   Sepsis due to undetermined organism (HCC) 07/11/2023   Multifocal pneumonia 07/11/2023   Acute respiratory insufficiency 07/11/2023   Hypothyroidism 11/03/2018   Essential hypertension 07/14/2018   Dysphagia 02/22/2017   Major depression, single episode 08/10/2016   Fall 05/01/2016   Morbid obesity due to excess calories (HCC) 05/16/2015   Chronic pain syndrome 05/16/2015   OSA (obstructive sleep apnea) 05/16/2015   Cataract 01/23/2015   RLS (restless legs syndrome) 07/09/2014   Paroxysmal atrial fibrillation (HCC) 07/09/2014   Class 3 obesity with alveolar hypoventilation, serious comorbidity, and body mass index (BMI) of 40.0 to 44.9 in adult (HCC) 07/09/2014   OSA treated with BiPAP 07/09/2014   Urinary tract infection with hematuria 06/29/2014    Class: Acute   Obstructive sleep apnea hypopnea, severe 06/29/2014    Class: Chronic   Hyperlipidemia 04/12/2009   Gastro-esophageal reflux disease without esophagitis 04/12/2009   PCP:  Yolande Toribio [name], MD Pharmacy:   [facility] [phone] - [place], [place] - [address] [name] [address] Phone: [phone] Fax: [phone]     Social Drivers of Health (SDOH) Social History: SDOH Screenings   Food Insecurity: No Food Insecurity (01/21/2024)  Housing: Low Risk  (01/21/2024)  Transportation Needs: No Transportation Needs (01/21/2024)  Utilities: Not At Risk (01/21/2024)  Social Connections: Moderately Isolated (01/21/2024)  Tobacco Use: Medium Risk (09/17/2023)   SDOH Interventions:     Readmission Risk Interventions     No data to display

## 2024-01-25 NOTE — Progress Notes (Signed)
 Peripherally Inserted Central Catheter Placement  The IV Nurse has discussed with the patient and/or persons authorized to consent for the patient, the purpose of this procedure and the potential benefits and risks involved with this procedure.  The benefits include less needle sticks, lab draws from the catheter, and the patient may be discharged home with the catheter. Risks include, but not limited to, infection, bleeding, blood clot (thrombus formation), and puncture of an artery; nerve damage and irregular heartbeat and possibility to perform a PICC exchange if needed/ordered by physician.  Alternatives to this procedure were also discussed.  Bard Power PICC patient education guide, fact sheet on infection prevention and patient information card has been provided to patient /or left at bedside.    PICC Placement Documentation  PICC Double Lumen 01/25/24 Left Brachial 45 cm 0 cm (Active)  Indication for Insertion or Continuance of Line Poor Vasculature-patient has had multiple peripheral attempts or PIVs lasting less than 24 hours 01/25/24 1400  Exposed Catheter (cm) 0 cm 01/25/24 1400  Site Assessment Clean, Dry, Intact 01/25/24 1400  Lumen #1 Status Flushed;Saline locked;Blood return noted 01/25/24 1400  Lumen #2 Status Flushed;Saline locked;Blood return noted 01/25/24 1400  Dressing Type Transparent;Securing device 01/25/24 1400  Dressing Status Antimicrobial disc/dressing in place;Clean, Dry, Intact 01/25/24 1400  Line Care Connections checked and tightened 01/25/24 1400  Line Adjustment (NICU/IV Team Only) No 01/25/24 1400  Dressing Intervention New dressing;Adhesive placed at insertion site (IV team only) 01/25/24 1400  Dressing Change Due 02/01/24 01/25/24 1400       Jolee Na 01/25/2024, 2:01 PM

## 2024-01-26 ENCOUNTER — Encounter (HOSPITAL_COMMUNITY): Payer: Self-pay | Admitting: Internal Medicine

## 2024-01-26 ENCOUNTER — Inpatient Hospital Stay (HOSPITAL_COMMUNITY): Admitting: Anesthesiology

## 2024-01-26 ENCOUNTER — Encounter (HOSPITAL_COMMUNITY)
Admission: EM | Disposition: A | Discharge status: Skilled Nursing Facility | Payer: Self-pay | Source: Home / Self Care | Attending: Internal Medicine

## 2024-01-26 DIAGNOSIS — N179 Acute kidney failure, unspecified: Secondary | ICD-10-CM

## 2024-01-26 DIAGNOSIS — I130 Hypertensive heart and chronic kidney disease with heart failure and stage 1 through stage 4 chronic kidney disease, or unspecified chronic kidney disease: Secondary | ICD-10-CM

## 2024-01-26 DIAGNOSIS — I4891 Unspecified atrial fibrillation: Secondary | ICD-10-CM | POA: Diagnosis not present

## 2024-01-26 DIAGNOSIS — E876 Hypokalemia: Secondary | ICD-10-CM

## 2024-01-26 DIAGNOSIS — N1832 Chronic kidney disease, stage 3b: Secondary | ICD-10-CM

## 2024-01-26 DIAGNOSIS — I5033 Acute on chronic diastolic (congestive) heart failure: Secondary | ICD-10-CM | POA: Diagnosis present

## 2024-01-26 DIAGNOSIS — I951 Orthostatic hypotension: Secondary | ICD-10-CM

## 2024-01-26 DIAGNOSIS — I4819 Other persistent atrial fibrillation: Secondary | ICD-10-CM | POA: Diagnosis not present

## 2024-01-26 DIAGNOSIS — N1831 Chronic kidney disease, stage 3a: Secondary | ICD-10-CM

## 2024-01-26 HISTORY — PX: CARDIOVERSION: EP1203

## 2024-01-26 LAB — BASIC METABOLIC PANEL WITH GFR
Anion gap: 10 (ref 5–15)
BUN: 35 mg/dL — ABNORMAL HIGH (ref 8–23)
CO2: 31 mmol/L (ref 22–32)
Calcium: 8.1 mg/dL — ABNORMAL LOW (ref 8.9–10.3)
Chloride: 95 mmol/L — ABNORMAL LOW (ref 98–111)
Creatinine, Ser: 1.45 mg/dL — ABNORMAL HIGH (ref 0.61–1.24)
GFR, Estimated: 48 mL/min — ABNORMAL LOW (ref >60)
Glucose, Bld: 109 mg/dL — ABNORMAL HIGH (ref 70–99)
Potassium: 3.6 mmol/L (ref 3.5–5.1)
Sodium: 136 mmol/L (ref 135–145)

## 2024-01-26 LAB — CBC
HCT: 33.8 % — ABNORMAL LOW (ref 39.0–52.0)
Hemoglobin: 10.9 g/dL — ABNORMAL LOW (ref 13.0–17.0)
MCH: 30.4 pg (ref 26.0–34.0)
MCHC: 32.2 g/dL (ref 30.0–36.0)
MCV: 94.4 fL (ref 80.0–100.0)
Platelets: 307 K/uL (ref 150–400)
RBC: 3.58 MIL/uL — ABNORMAL LOW (ref 4.22–5.81)
RDW: 15.3 % (ref 11.5–15.5)
WBC: 11.9 K/uL — ABNORMAL HIGH (ref 4.0–10.5)
nRBC: 0 % (ref 0.0–0.2)

## 2024-01-26 LAB — COOXEMETRY PANEL
Carboxyhemoglobin: 0.8 % (ref 0.5–1.5)
Methemoglobin: <0.7 % (ref 0.0–1.5)
O2 Saturation: 55.3 %
Total hemoglobin: 11.1 g/dL — ABNORMAL LOW (ref 12.0–16.0)

## 2024-01-26 MED ORDER — POTASSIUM CHLORIDE CRYS ER 20 MEQ PO TBCR
40.0000 meq | EXTENDED_RELEASE_TABLET | Freq: Once | ORAL | Status: AC
Start: 2024-01-26 — End: 2024-01-26
  Administered 2024-01-26: 40 meq via ORAL
  Filled 2024-01-26: qty 2

## 2024-01-26 MED ORDER — FUROSEMIDE 10 MG/ML IJ SOLN
80.0000 mg | Freq: Two times a day (BID) | INTRAMUSCULAR | Status: DC
  Administered 2024-01-26 – 2024-01-29 (×6): 80 mg via INTRAVENOUS
  Filled 2024-01-26 (×6): qty 8

## 2024-01-26 MED ORDER — METOPROLOL TARTRATE 25 MG PO TABS
25.0000 mg | ORAL_TABLET | Freq: Two times a day (BID) | ORAL | Status: DC
  Administered 2024-01-26: 25 mg via ORAL
  Filled 2024-01-26: qty 1

## 2024-01-26 MED ORDER — METOPROLOL TARTRATE 25 MG PO TABS
25.0000 mg | ORAL_TABLET | Freq: Four times a day (QID) | ORAL | Status: DC
  Administered 2024-01-26 – 2024-01-30 (×14): 25 mg via ORAL
  Filled 2024-01-26 (×15): qty 1

## 2024-01-26 MED ORDER — PROPOFOL 10 MG/ML IV BOLUS
INTRAVENOUS | Status: DC | PRN
  Administered 2024-01-26: 30 mg via INTRAVENOUS
  Administered 2024-01-26: 10 mg via INTRAVENOUS

## 2024-01-26 NOTE — Progress Notes (Signed)
 Informed to ortho tech regarding unna boot application.

## 2024-01-26 NOTE — Transfer of Care (Signed)
 Immediate Anesthesia Transfer of Care Note  Patient: Carlos Hanson  Procedure(s) Performed: CARDIOVERSION  Patient Location: PACU  Anesthesia Type:General  Level of Consciousness: drowsy  Airway & Oxygen Therapy: Patient Spontanous Breathing and Patient connected to face mask oxygen  Post-op Assessment: Report given to RN and Post -op Vital signs reviewed and stable  Post vital signs: Reviewed and stable  Last Vitals:  Vitals Value Taken Time  BP    Temp    Pulse    Resp    SpO2      Last Pain:  Vitals:   01/26/24 0400  TempSrc: Oral  PainSc: 0-No pain      Patients Stated Pain Goal: 0 (01/25/24 2000)  Complications: No notable events documented.

## 2024-01-26 NOTE — Progress Notes (Addendum)
 Progress Note  Patient Name: [name] Date of Encounter: 01/26/2024 Primary Cardiologist: Gordy Bergamo, MD   Subjective   Santina down for cardioversion earlier this week but each time he was shocked he went right back into A-fib despite being on IV Amio.    Re attempted cardioversion today that was unsuccessful despite 6 days of IV Amio   Denies any CP or SOB.  Vital Signs    Vitals:   01/26/24 0835 01/26/24 0840 01/26/24 0842 01/26/24 0859  BP: (!) 140/92 (!) 140/98 124/67 134/84  Pulse: (!) 112 (!) 123 (!) 121 (!) 130  Resp: 11 14 10 17   Temp:  98.4 F (36.9 C)  98.2 F (36.8 C)  TempSrc:  Oral  Oral  SpO2: 95% 95% 95% 95%  Weight:        Intake/Output Summary (Last 24 hours) at 01/26/2024 0920 Last data filed at 01/26/2024 0600 Gross per 24 hour  Intake 1301.88 ml  Output 1690 ml  Net -388.12 ml   Filed Weights   01/23/24 1418  Weight: (!) 138.1 kg   (OH)  Physical Exam   GEN: Well nourished, well developed in no acute distress HEENT: Normal NECK: Difficult to assess due to body habitus; No carotid bruits LYMPHATICS: No lymphadenopathy CARDIAC: Tachycardic, no murmurs, rubs, gallops RESPIRATORY: Decreased breath sounds at the bases ABDOMEN: large MUSCULOSKELETAL:  2 pitting BLE edema; No deformity  SKIN: Warm and dry NEUROLOGIC:  Alert and oriented x 3 PSYCHIATRIC:  Normal affect  Labs   Telemetry: atrial fibrillation with RVR   Chemistry Recent Labs  Lab 01/22/24 0337 01/23/24 0250 01/24/24 0334 01/25/24 0301 01/26/24 0322  NA 140   < > 137 136 136  K 4.0   < > 3.8 3.8 3.6  CL 101   < > 97* 97* 95*  CO2 27   < > 27 28 31   GLUCOSE 98   < > 101* 111* 109*  BUN 32*   < > 33* 40* 35*  CREATININE 1.48*   < > 1.73* 1.68* 1.45*  CALCIUM  8.9   < > 8.4* 8.3* 8.1*  PROT 5.9*  --   --   --   --   ALBUMIN 2.4*  --   --   --   --   AST 13*  --   --   --   --   ALT 21  --   --   --   --   ALKPHOS 56  --   --   --   --   BILITOT 0.6  --   --   --    --   GFRNONAA 46*   < > 38* 40* 48*  ANIONGAP 12   < > 13 11 10    < > = values in this interval not displayed.     Hematology Recent Labs  Lab 01/24/24 0334 01/25/24 0301 01/26/24 0322  WBC 12.5* 12.3* 11.9*  RBC 3.91* 3.83* 3.58*  HGB 11.8* 11.7* 10.9*  HCT 36.7* 36.1* 33.8*  MCV 93.9 94.3 94.4  MCH 30.2 30.5 30.4  MCHC 32.2 32.4 32.2  RDW 15.8* 15.6* 15.3  PLT 353 329 307     Assessment & Plan   Atrial fibrillation RVR (duration clear) - Patient has a history of PAF and has been on amiodarone  100 mg daily chronically at home  - Admit with A-fib with RVR and loaded on IV Amio over the weekend - s/p attempted DCCV earlier this  week but was unsuccessful - s/p repeat DCCV  today that was unsuccessful despite 6 days of IV Amio load and multiple boluses - Currently on amiodarone  drip at 60 mg/h - BP is stable today so we will add on Lopressor  25 mg twice daily (was on Cardizem  CD 120 mg daily at home) - Will get EP to see today.  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 - Replete potassium to keep> 4 - TSH normal at 0.749  Acute on chronic diastolic CHF HF Phenotype with Baseline CKD III b AKI Borderline hypokalemia - Admitted with volume overload in the setting of A-fib with RVR - diuretics on hold for AKI - UOP 1.7 L overnight and 7.9 L since admission; serum creatinine 1.58->>1.73>> 1.68>> 1.45 today - 2D echo: EF 55% with mild LVH, mild RV dysfunction, mild RAE, trivial MR and small pericardial effusion - Serum creatinine improving after holding diuretics - Difficult to assess volume status given his morbid obesity - Potassium 3.6 today>> replete to keep > 4 - I am going to ask advanced heart failure to see to help with management of his CHF as I suspect he needs more diuresis but it is being limited by his development of AKI.  This may be the reason that he will not convert to sinus rhythm as well  Morbid obesity OSA Hypotension has resolved - continuing with CPAP (at  bedside), patient has morbid obesity, no smoking or alcohol  use  I spent 30 minutes caring for this patient today face to face, ordering and reviewing labs, reviewing records from 2D echo from 2011, reviewing EKGs and cardioversion note yesterday, seeing the patient and talking with patient's son, documenting in the record, and arranging EP consult   Signed; Wilbert Bihari, MD, [facility] 01/24/2024

## 2024-01-26 NOTE — CV Procedure (Signed)
 Procedure: Electrical Cardioversion Indications:  Atrial Fibrillation  Procedure Details:  Consent: Risks of procedure as well as the alternatives and risks of each were explained to the (patient/caregiver).  Consent for procedure obtained.  Time Out: Verified patient identification, verified procedure, site/side was marked, verified correct patient position, special equipment/implants available, medications/allergies/relevent history reviewed, required imaging and test results available. PERFORMED.  Patient placed on cardiac monitor, pulse oximetry, supplemental oxygen as necessary.  Sedation given: propofol  per anesthesia Pacer pads placed anterior and posterior chest.  Cardioverted 3 time(s).  Cardioversion with synchronized biphasic 360J x 3 shock.  Unsuccessful cardioversion.  Evaluation: Findings: Post procedure EKG shows: Atrial Fibrillation Complications: None Patient did tolerate procedure well.  Time Spent Directly with the Patient:  30 minutes   Carlos Hanson A Carlos Hanson 01/26/2024, 9:16 AM

## 2024-01-26 NOTE — Consult Note (Addendum)
 Advanced Heart Failure Team Consult Note   Primary Physician: Yolande Toribio [name], MD Cardiologist:  Gordy Bergamo, MD  Reason for Consultation: A/C HFpEF  HPI:    Carlos Hanson is seen today for evaluation of A/C HFpEF at the request of Dr. Shlomo, Cardiology.   Carlos Hanson is a 83 y.o. male with chronic HFpEF, PAF on Xarelto , OA, venous stasis, CAD, GERD, HLD, OSA on CPAP and obesity.   He was admitted last week with worsening BLE edema and frequent urination. HR in 50s-70s on arrival but ended up going into AF RVR. Stated on cardizem  gtt. UA +, started on rocephin . Massively volume overloaded on exam on admission, diuresis started with IV lasix . IV lasix  stopped 9/22 d/t SCr up to 1.7 though baseline is ~1.4-1.6. Failed DCCV x2 at this point in the setting of volume overload. Has been on IV amiodarone . EP asked to see today. AHF team asked to see to help with diuresis.   Resting comfortably in bed. Denies CP/SOB. Has not been getting up and moving around much (will consult PT).   Echo 9/25: EF 55%. Mild concentric LVH, RV mildly reduced, RA mildly dilated, trivial MR, mild dilatation of aortic root 39 mm. Mild dilatation of ascending aorta 39 mm. Small pericardial effusion present.   Home Medications Prior to Admission medications   Medication Sig Start Date End Date Taking? Authorizing Provider  acetaminophen  (TYLENOL ) 500 MG tablet Take 2 tablets (1,000 mg total) by mouth every 8 (eight) hours. Patient taking differently: Take 1,000-1,500 mg by mouth daily as needed for mild pain (pain score 1-3) or moderate pain (pain score 4-6). 03/13/18  Yes Samtani, Jai-Gurmukh, MD  amiodarone  (PACERONE ) 200 MG tablet Take 0.5 tablets (100 mg total) by mouth daily. 10/12/23  Yes Bergamo Gordy, MD  atorvastatin  (LIPITOR) 20 MG tablet Take 1 tablet by mouth daily. 07/22/18  Yes [provider]  clobetasol (TEMOVATE) 0.05 % external solution Apply 1 Application topically daily as needed  (psoriasis). 05/24/23  Yes [provider]  diltiazem  (CARDIZEM  CD) 120 MG 24 hr capsule Take 120 mg by mouth daily.   Yes [provider]  donepezil  (ARICEPT ) 5 MG tablet Take 5 mg by mouth at bedtime. 04/23/23  Yes [provider]  DULoxetine  (CYMBALTA ) 60 MG capsule Take 60 mg by mouth in the morning, at noon, and at bedtime.   Yes [provider]  famotidine  (PEPCID ) 20 MG tablet Take 20 mg by mouth daily. 04/04/21  Yes [provider]  gabapentin  (NEURONTIN ) 300 MG capsule Take 600 mg by mouth 3 (three) times daily.   Yes [provider]  ketoconazole (NIZORAL) 2 % cream Apply 1 Application topically daily as needed for irritation. 06/29/23  Yes [provider]  lactulose  (CHRONULAC ) 10 GM/15ML solution Take 30 mLs (20 g total) by mouth daily. 03/13/18  Yes Samtani, Jai-Gurmukh, MD  levothyroxine  (SYNTHROID ) 50 MCG tablet Take 50 mcg by mouth daily before breakfast.   Yes [provider]  MELATONIN PO Take 10 mg by mouth at bedtime.   Yes [provider]  Multiple Vitamins-Minerals (VISION FORMULA EYE HEALTH) CAPS Take 1 tablet by mouth 2 (two) times daily. [employer]   Yes [provider]  oxymetazoline  (AFRIN) 0.05 % nasal spray Place 1 spray into both nostrils at bedtime as needed for congestion (sinuses).   Yes [provider]  Rivaroxaban  (XARELTO ) 15 MG TABS tablet Take 1 tablet (15 mg total) by mouth daily with  supper. 12/13/23  Yes Ladona Heinz, MD  SENEXON-S 8.6-50 MG tablet Take 1 tablet by mouth at bedtime.   Yes [provider]    Past Medical History: Past Medical History:  Diagnosis Date   Arthritis    Chronic back pain    Coronary artery disease    40% mLAD/DIAG bifurcation 03/25/10    Dysrhythmia    History of atrial fibrillation   Gout    H/O hiatal hernia    Hearing decreased    right ear   History of bladder infections    History of bleeding ulcers    History  of ulcer disease    Hypercholesteremia    Nocturia    Obstructive sleep apnea hypopnea, severe    Sleep apnea    cpap sleep study 2011    Past Surgical History: Past Surgical History:  Procedure Laterality Date   BACK SURGERY     CARDIAC CATHETERIZATION     2011   CARDIOVERSION N/A 01/24/2024   Procedure: CARDIOVERSION;  Surgeon: Kate Lonni CROME, MD;  Location: [facility] INVASIVE CV LAB;  Service: Cardiovascular;  Laterality: N/A;   cardioverson     COLONOSCOPY  08/27/2011   Procedure: COLONOSCOPY;  Surgeon: Claudis RAYMOND Rivet, MD;  Location: [facility] SUITE;  Service: Endoscopy;  Laterality: N/A;  930   ESOPHAGOGASTRODUODENOSCOPY     JOINT REPLACEMENT     left shoulder   Left shoulder replacement     LUMBAR LAMINECTOMY  03/15/2012   Procedure: LUMBAR LAMINECTOMY WITH  X-STOP 2 LEVEL;  Surgeon: Victory Gens, MD;  Location: [facility] NEURO ORS;  Service: Neurosurgery;  Laterality: N/A;  Lumbar two-three, three-four XSTOP   TONSILLECTOMY      Family History: Family History  Problem Relation Age of Onset   Heart failure Mother    Diabetes Mother    Arthritis Mother    High blood pressure Mother    Heart failure Father    Colon cancer Neg Hx    Sleep apnea Neg Hx     Social History: Social History   Socioeconomic History   Marital status: Married    Spouse name: [name]    Number of children: 3   Years of education: college   Highest education level: Not on file  Occupational History    Comment: Retired  Tobacco Use   Smoking status: Former    Current packs/day: 0.00    Average packs/day: 1 pack/day for 25.0 years (25.0 ttl pk-yrs)    Types: Cigarettes    Start date: 07/13/1980    Quit date: 07/13/2005    Years since quitting: 18.5   Smokeless tobacco: Former    Types: Chew   Tobacco comments:    Quit 8 years ago.  Vaping Use   Vaping status: Never Used  Substance and Sexual Activity   Alcohol  use: Yes    Alcohol /week: 2.0 standard drinks of alcohol     Types: 2 Shots  of liquor per week    Comment: once a week   Drug use: No   Sexual activity: Not on file  Other Topics Concern   Not on file  Social History Narrative   Patient lives at home with [name]).    Retired.   Education college.   Right handed.   Caffeine one cup of coffee daily.   Social Drivers of Corporate investment banker Strain: Not on file  Food Insecurity: No Food Insecurity (01/21/2024)   Hunger Vital Sign    Worried  About Running Out of Food in the Last Year: Never true    Ran Out of Food in the Last Year: Never true  Transportation Needs: No Transportation Needs (01/21/2024)   PRAPARE - Administrator, Civil Service (Medical): No    Lack of Transportation (Non-Medical): No  Physical Activity: Not on file  Stress: Not on file  Social Connections: Moderately Isolated (01/21/2024)   Social Connection and Isolation Panel    Frequency of Communication with Friends and Family: More than three times a week    Frequency of Social Gatherings with Friends and Family: More than three times a week    Attends Religious Services: Patient declined    Database administrator or Organizations: No    Attends Banker Meetings: Patient declined    Marital Status: Married    Allergies:  Allergies  Allergen Reactions   Celecoxib Other (See Comments)    Ineffective   Penicillin G Sodium Nausea Only   Naprosyn [Naproxen] Rash    Objective:    Vital Signs:   Temp:  [98.2 F (36.8 C)-99.5 F (37.5 C)] 99.5 F (37.5 C) (09/24 1200) Pulse Rate:  [64-140] 64 (09/24 1200) Resp:  [10-22] 13 (09/24 1200) BP: (107-140)/(67-98) 119/77 (09/24 1200) SpO2:  [91 %-100 %] 97 % (09/24 1200) Last BM Date : 01/22/24  Weight change: Filed Weights   01/23/24 1418  Weight: (!) 138.1 kg    Intake/Output:   Intake/Output Summary (Last 24 hours) at 01/26/2024 1636 Last data filed at 01/26/2024 1340 Gross per 24 hour  Intake 1181.88 ml  Output 1340 ml  Net  -158.12 ml   Physical Exam   General:  elderly appearing.  No respiratory difficulty Neck: JVD difficult to see, thick neck.  Cor: Irregular rate & rhythm. No murmurs. Lungs: clear, diminished bases Extremities: +2-3 BLE edema to hip Neuro: alert & oriented x 3. Affect flat.   Telemetry   AF/Flu 90s- low 100s (Personally reviewed)    Labs   Basic Metabolic Panel: Recent Labs  Lab 01/22/24 0337 01/23/24 0250 01/24/24 0334 01/25/24 0301 01/26/24 0322  NA 140 140 137 136 136  K 4.0 4.3 3.8 3.8 3.6  CL 101 101 97* 97* 95*  CO2 27 26 27 28 31   GLUCOSE 98 100* 101* 111* 109*  BUN 32* 33* 33* 40* 35*  CREATININE 1.48* 1.58* 1.73* 1.68* 1.45*  CALCIUM  8.9 8.7* 8.4* 8.3* 8.1*  MG 1.9 1.8  --   --   --     Liver Function Tests: Recent Labs  Lab 01/22/24 0337  AST 13*  ALT 21  ALKPHOS 56  BILITOT 0.6  PROT 5.9*  ALBUMIN 2.4*   No results for input(s): LIPASE, AMYLASE in the last 168 hours. No results for input(s): AMMONIA in the last 168 hours.  CBC: Recent Labs  Lab 01/22/24 0337 01/23/24 0250 01/24/24 0334 01/25/24 0301 01/26/24 0322  WBC 13.2* 11.7* 12.5* 12.3* 11.9*  HGB 10.8* 12.0* 11.8* 11.7* 10.9*  HCT 33.6* 37.5* 36.7* 36.1* 33.8*  MCV 96.0 95.7 93.9 94.3 94.4  PLT 385 328 353 329 307    Cardiac Enzymes: No results for input(s): CKTOTAL, CKMB, CKMBINDEX, TROPONINI in the last 168 hours.  BNP: BNP (last 3 results) Recent Labs    07/11/23 0653  BNP 125.3*   ProBNP (last 3 results) No results for input(s): PROBNP in the last 8760 hours.   CBG: No results for input(s): GLUCAP in the last 168 hours.  Coagulation Studies: No results for input(s): LABPROT, INR in the last 72 hours.  Imaging   EP STUDY Result Date: 01/26/2024 See surgical note for result.  Medications:   Current Medications:  atorvastatin   20 mg Oral Daily   Chlorhexidine  Gluconate Cloth  6 each Topical Daily   donepezil   5 mg Oral QHS    doxycycline   100 mg Oral Q12H   DULoxetine   60 mg Oral BID   famotidine   20 mg Oral Daily   gabapentin   600 mg Oral TID   lactulose   20 g Oral Daily   levothyroxine   50 mcg Oral QAC breakfast   melatonin  10 mg Oral QHS   metoprolol  tartrate  25 mg Oral Q6H   multivitamin  1 tablet Oral Daily   Rivaroxaban   20 mg Oral Q supper   senna-docusate  1 tablet Oral QHS   sodium chloride  flush  10-40 mL Intracatheter Q12H    Infusions:  amiodarone  60 mg/hr (01/26/24 1338)   cefTRIAXone  (ROCEPHIN )  IV 2 g (01/26/24 1340)    Patient Profile   Carlos Hanson is a 83 y.o. male with chronic HFpEF, PAF on Xarelto , OA, venous stasis, CAD, GERD, HLD, OSA on CPAP and obesity. AHF team to see with A/C HFpEF.   Assessment/Plan  A/C HFpEF - Echo 9/25: EF 55%. Mild concentric LVH, RV mildly reduced, RA mildly dilated, trivial MR, mild dilatation of aortic root 39 mm. Mild dilatation of ascending aorta 39 mm. Small pericardial effusion present.  - NYHA IV on admission - Volume overloaded on exam. Start lasix  80 IV BID (had good response previously) - PICC line in place. Check CVP and co-ox. May need inotrope to help with diuresis. Will try to avoid in the setting of a fib/flu RVR - Place UNNA boots - BB increased for per EP, may need to decrease while volume overloaded. Follow response.  - Strict I&O. Daily weights   A fib/Flu RVR - EP following - Failed DCCV x2 in the setting of volume overload - Continue xarelto  - Continue IV amiodarone  - Currently not a candidate for AVJ + PPM with acute conditions. Plan for OP DCCV  CKD stage 3a - Baseline SCr ~ 1.4-1.6 - Increase diuretics as above - avoid hypotension  Acute cystitis d/t Klebsiella pneumoniae - mgmt per primary team - Continue abx  OSA noncompliant with CPAP - Discussed importance of compliance   Morbid obesity - Body mass index is 42.46 kg/m.   Needs to mobilize. PT consulted today.   Length of Stay: 5  Beckey LITTIE Coe, NP   01/26/2024, 4:36 PM  Advanced Heart Failure Team Pager [phone] (M-F; 7a - 5p)  Please contact [facility] for night-coverage after hours (4p -7a ) and weekends on [email or web address]   Patient seen with NP, I formulated the plan and agree with the above note.   He was admitted with AF/RVR and CHF.  He was initially diuresed but has been off Lasix  for the last couple of days. He has now failed DCCV x 2 despite amiodarone  use and remains in AF rate 90s-100s.  Creatinine baseline around 1.5, down to 1.45 this morning.    Echo showed EF 55%, mild LVH, mild RV dysfunction.   General: NAD Neck: Thick/JVP difficult, no thyromegaly or thyroid  nodule.  Lungs: Clear to auscultation bilaterally with normal respiratory effort. CV: Nondisplaced PMI.  Heart irregular S1/S2, no S3/S4, no murmur.  1+ edema to knees.  No carotid bruit.  Difficult to palpate  pedal pulses.  Abdomen: Soft, nontender, no hepatosplenomegaly, no distention.  Skin: Intact without lesions or rashes.  Neurologic: Alert and oriented x 3.  Psych: Normal affect. Extremities: No clubbing or cyanosis.  HEENT: Normal.   Patient with HFpEF, worsened HF in the setting of recurrent atrial fibrillation.  I suspect he is still significantly volume overloaded though exam is difficult.  Will be difficult to cardiovert him until his volume is optimized.  His creatinine is around his baseline (CKD stage 3).  - Restart Lasix  80 mg IV bid.  - He has a PICC, we will set up a CVP to follow volume status.  - Continue amiodarone  gtt and Xarelto , could re-try DCCV once more after complete diuresis or alternatively load amiodarone  for several more weeks and try again down the road.   Ezra Shuck 01/26/2024 5:52 PM

## 2024-01-26 NOTE — Consult Note (Addendum)
 ELECTROPHYSIOLOGY CONSULT NOTE    Patient ID: [name] MRN: [id number], DOB/AGE: 84-Jul-1942 84 y.o.  Admit date: 01/21/2024 Date of Consult: 01/26/2024  Primary Physician: Yolande Toribio [name], MD Primary Cardiologist: Gordy Bergamo, MD  Electrophysiologist: New   Referring Provider: Dr. Shlomo  Patient Profile: Carlos Hanson is a 83 y.o. male with a history of PAF, on Xarelto , OA, Chronic venous stasis, CAD, GERD, gout, HLD, OSA on CPAP, and obesity who is being seen today for the evaluation of AF/AFL with RVR at the request of Dr. Shlomo.  HPI:  Carlos Hanson is a 83 y.o. male with medical history as above.   Chronically has followed with Dr. Ganji and has been on low dose amiodarone  for management of his AF.   Admitted 9/19 for worsening edema as well as frequent urination. HRs were 50-70s on arrival by report, but he shortly went into AF with RVR. Started on IV cardizem  and cardiology called.  Given IV rocephin  for + UA.  Tmax 102.3. Pt also noted to have marked volume overload, confounded by chronic venous stasis.   Treated with IV ABx as well as high dose IV lasix  up to 100 mg, stopped 9/22 with Cr trending up.   Attempted cardioversion 9/22, pt had several beats of sinus and went back into AF each time.  Re-bolused with IV amiodarone  and attempted again today 9/24.   Xarelto  had been increased 9/23.    EP asked to see with recurrent IRAF.   Pt is fatigued in bed. Difficult to understand speech due to lack of teeth. Has some heart racing. Denies SOB at rest, but does have mild conversation dyspnea. No chest pain or syncope. Has his leg swelling has been waxing and waning, but worse over the past at least month.    Labs Potassium3.6 (09/24 0322)   Creatinine, ser  1.45* (09/24 0322) PLT  307 (09/24 0322) HGB  10.9* (09/24 0322) WBC 11.9* (09/24 0322)  .    Past Medical History:  Diagnosis Date   Arthritis    Chronic back pain    Coronary artery disease    40%  mLAD/DIAG bifurcation 03/25/10    Dysrhythmia    History of atrial fibrillation   Gout    H/O hiatal hernia    Hearing decreased    right ear   History of bladder infections    History of bleeding ulcers    History of ulcer disease    Hypercholesteremia    Nocturia    Obstructive sleep apnea hypopnea, severe    Sleep apnea    cpap sleep study 2011     Surgical History:  Past Surgical History:  Procedure Laterality Date   BACK SURGERY     CARDIAC CATHETERIZATION     2011   CARDIOVERSION N/A 01/24/2024   Procedure: CARDIOVERSION;  Surgeon: Kate Lonni CROME, MD;  Location: [facility] INVASIVE CV LAB;  Service: Cardiovascular;  Laterality: N/A;   cardioverson     COLONOSCOPY  08/27/2011   Procedure: COLONOSCOPY;  Surgeon: Claudis RAYMOND Rivet, MD;  Location: [facility] ENDO SUITE;  Service: Endoscopy;  Laterality: N/A;  930   ESOPHAGOGASTRODUODENOSCOPY     JOINT REPLACEMENT     left shoulder   Left shoulder replacement     LUMBAR LAMINECTOMY  03/15/2012   Procedure: LUMBAR LAMINECTOMY WITH  X-STOP 2 LEVEL;  Surgeon: Victory Gens, MD;  Location: [facility] NEURO ORS;  Service: Neurosurgery;  Laterality: N/A;  Lumbar two-three, three-four XSTOP  TONSILLECTOMY       Medications Prior to Admission  Medication Sig Dispense Refill Last Dose/Taking   acetaminophen  (TYLENOL ) 500 MG tablet Take 2 tablets (1,000 mg total) by mouth every 8 (eight) hours. (Patient taking differently: Take 1,000-1,500 mg by mouth daily as needed for mild pain (pain score 1-3) or moderate pain (pain score 4-6).) 30 tablet 0 01/21/2024 Morning   amiodarone  (PACERONE ) 200 MG tablet Take 0.5 tablets (100 mg total) by mouth daily. 45 tablet 3 01/21/2024 Morning   atorvastatin  (LIPITOR) 20 MG tablet Take 1 tablet by mouth daily.   01/21/2024 Morning   clobetasol (TEMOVATE) 0.05 % external solution Apply 1 Application topically daily as needed (psoriasis).   Unknown   diltiazem  (CARDIZEM  CD) 120 MG 24 hr capsule Take 120 mg by mouth daily.    01/21/2024 Morning   donepezil  (ARICEPT ) 5 MG tablet Take 5 mg by mouth at bedtime.   01/20/2024 Bedtime   DULoxetine  (CYMBALTA ) 60 MG capsule Take 60 mg by mouth in the morning, at noon, and at bedtime.   01/21/2024 Morning   famotidine  (PEPCID ) 20 MG tablet Take 20 mg by mouth daily.   01/21/2024 Morning   gabapentin  (NEURONTIN ) 300 MG capsule Take 600 mg by mouth 3 (three) times daily.   01/21/2024 Morning   ketoconazole (NIZORAL) 2 % cream Apply 1 Application topically daily as needed for irritation.   Past Week   lactulose  (CHRONULAC ) 10 GM/15ML solution Take 30 mLs (20 g total) by mouth daily. 240 mL 1 01/21/2024 Morning   levothyroxine  (SYNTHROID ) 50 MCG tablet Take 50 mcg by mouth daily before breakfast.   01/21/2024 Morning   MELATONIN PO Take 10 mg by mouth at bedtime.   01/20/2024 Bedtime   Multiple Vitamins-Minerals (VIS[employer]UL[employer] EYE HEALTH) CAPS Take 1 tablet by mouth 2 (two) times daily. [employer]   01/21/2024 Morning   oxymetazoline  (AFRIN) 0.05 % nasal spray Place 1 spray into both nostrils at bedtime as needed for congestion (sinuses).   Past Week   Rivaroxaban  (XARELTO ) 15 MG TABS tablet Take 1 tablet (15 mg total) by mouth daily with supper. 90 tablet 3 01/20/2024 at  6:00 PM   SENEXON-S 8.6-50 MG tablet Take 1 tablet by mouth at bedtime.   01/20/2024 Bedtime    Inpatient Medications:   atorvastatin   20 mg Oral Daily   Chlorhexidine  Gluconate Cloth  6 each Topical Daily   donepezil   5 mg Oral QHS   doxycycline   100 mg Oral Q12H   DULoxetine   60 mg Oral BID   famotidine   20 mg Oral Daily   gabapentin   600 mg Oral TID   lactulose   20 g Oral Daily   levothyroxine   50 mcg Oral QAC breakfast   melatonin  10 mg Oral QHS   metoprolol  tartrate  25 mg Oral BID   multivitamin  1 tablet Oral Daily   Rivaroxaban   20 mg Oral Q supper   senna-docusate  1 tablet Oral QHS   sodium chloride  flush  10-40 mL Intracatheter Q12H    Allergies:  Allergies  Allergen Reactions   Celecoxib  Other (See Comments)    Ineffective   Penicillin G Sodium Nausea Only   Naprosyn [Naproxen] Rash    Family History  Problem Relation Age of Onset   Heart failure Mother    Diabetes Mother    Arthritis Mother    High blood pressure Mother    Heart failure Father    Colon cancer Neg Hx  Sleep apnea Neg Hx      Physical Exam: Vitals:   01/26/24 0835 01/26/24 0840 01/26/24 0842 01/26/24 0859  BP: (!) 140/92 (!) 140/98 124/67 134/84  Pulse: (!) 112 (!) 123 (!) 121 (!) 130  Resp: 11 14 10 17   Temp:  98.4 F (36.9 C)  98.2 F (36.8 C)  TempSrc:  Oral  Oral  SpO2: 95% 95% 95% 95%  Weight:        GEN- Fatigued and chronically ill appearing.  Lungs- Mild, conversational dyspnea.  Heart- Irregularly irregular rate and rhythm GI- Large, distended and edematous.  Extremities- 2-3+ edema into his upper thighs and flank.    Radiology/Studies: EP STUDY Result Date: 01/26/2024 See surgical note for result.  DG Chest Port 1 View Result Date: 01/25/2024 CLINICAL DATA:  PICC line placement EXAM: PORTABLE CHEST 1 VIEW COMPARISON:  None Available. FINDINGS: PICC line placed with tip in the proximal SVC. Normal cardiac silhouette. No effusion, infiltrate or pneumothorax. IMPRESSION: PICC line with tip in the proximal SVC. Electronically Signed   By: Jackquline Boxer M.D.   On: 01/25/2024 14:50   DG Wrist 2 Views Right Result Date: 01/25/2024 CLINICAL DATA:  Pain EXAM: RIGHT WRIST - 2 VIEW COMPARISON:  None Available. FINDINGS: No distal radius or ulnar fracture. Radiocarpal joint is intact. No carpal fracture. No soft tissue abnormality. Mild joint space narrowing of the radiocarpal joint and first carpal metacarpal joint. IMPRESSION: No fracture or dislocation.  Mild degenerative change Electronically Signed   By: Jackquline Boxer M.D.   On: 01/25/2024 11:28   US  EKG SITE RITE Result Date: 01/25/2024 If Site Rite image not attached, placement could not be confirmed due to current cardiac  rhythm.  ECHOCARDIOGRAM COMPLETE Result Date: 01/24/2024    ECHOCARDIOGRAM REPORT   Patient Name:   Carlos Hanson Date of Exam: 01/24/2024 Medical Rec #:  [id number]    Height:       71.0 in Accession #:    [id number]   Weight:       304.5 lb Date of Birth:  11-21-1940     BSA:          2.521 m Patient Age:    83 years     BP:           141/120 mmHg Patient Gender: M            HR:           125 bpm. Exam Location:  Inpatient Procedure: 2D Echo, Pediatric Echo, Color Doppler and Intracardiac Opacification            Agent (Both Spectral and Color Flow Doppler were utilized during            procedure). Indications:    Atrial fibrillation I48.91  History:        Patient has no prior history of Echocardiogram examinations.                 CAD, Arrythmias:Atrial Fibrillation; Risk Factors:Sleep Apnea.                 Hyperlipidemia.  Sonographer:    BERNARDA ROCKS Referring Phys: [id number] PROSPER M AMPONSAH IMPRESSIONS  1. Left ventricular ejection fraction, by estimation, is 55%. The left ventricle has normal function. Left ventricular endocardial border not optimally defined to evaluate regional wall motion. There is mild concentric left ventricular hypertrophy. Left  ventricular diastolic parameters are indeterminate.  2. Right ventricular systolic function is mildly reduced.  The right ventricular size is normal. There is normal pulmonary artery systolic pressure.  3. Right atrial size was mildly dilated.  4. The mitral valve is normal in structure. Trivial mitral valve regurgitation. No evidence of mitral stenosis.  5. The aortic valve is tricuspid. There is moderate calcification of the aortic valve. Aortic valve regurgitation is not visualized. No aortic stenosis is present.  6. Aortic dilatation noted. There is mild dilatation of the aortic root, measuring 39 mm. There is mild dilatation of the ascending aorta, measuring 39 mm.  7. A small pericardial effusion is present. The pericardial effusion is  circumferential.  8. The patient was in atrial fibrillation.  9. Technically difficult study with poor acoustic windows. LV function appears grossly normal. FINDINGS  Left Ventricle: Left ventricular ejection fraction, by estimation, is 55%. The left ventricle has normal function. Left ventricular endocardial border not optimally defined to evaluate regional wall motion. Definity  contrast agent was given IV to delineate the left ventricular endocardial borders. The left ventricular internal cavity size was normal in size. There is mild concentric left ventricular hypertrophy. Left ventricular diastolic parameters are indeterminate. Right Ventricle: The right ventricular size is normal. Right vetricular wall thickness was not well visualized. Right ventricular systolic function is mildly reduced. There is normal pulmonary artery systolic pressure. The tricuspid regurgitant velocity is 2.74 m/s, and with an assumed right atrial pressure of 3 mmHg, the estimated right ventricular systolic pressure is 33.0 mmHg. Left Atrium: Left atrial size was normal in size. Right Atrium: Right atrial size was mildly dilated. Pericardium: A small pericardial effusion is present. The pericardial effusion is circumferential. Mitral Valve: The mitral valve is normal in structure. Mild to moderate mitral annular calcification. Trivial mitral valve regurgitation. No evidence of mitral valve stenosis. MV peak gradient, 7.0 mmHg. The mean mitral valve gradient is 3.0 mmHg. Tricuspid Valve: The tricuspid valve is normal in structure. Tricuspid valve regurgitation is trivial. Aortic Valve: The aortic valve is tricuspid. There is moderate calcification of the aortic valve. Aortic valve regurgitation is not visualized. No aortic stenosis is present. Aortic valve mean gradient measures 3.0 mmHg. Aortic valve peak gradient measures 5.7 mmHg. Aortic valve area, by VTI measures 4.61 cm. Pulmonic Valve: The pulmonic valve was not well visualized.  Pulmonic valve regurgitation is not visualized. Aorta: Aortic dilatation noted. There is mild dilatation of the aortic root, measuring 39 mm. There is mild dilatation of the ascending aorta, measuring 39 mm. IAS/Shunts: The interatrial septum was not well visualized.  LEFT VENTRICLE PLAX 2D LVIDd:         4.00 cm LVIDs:         2.50 cm LV PW:         1.00 cm LV IVS:        1.00 cm LVOT diam:     2.10 cm LV SV:         68 LV SV Index:   27 LVOT Area:     3.46 cm  LV Volumes (MOD) LV vol d, MOD A2C: 87.2 ml LV vol d, MOD A4C: 115.0 ml LV vol s, MOD A2C: 30.7 ml LV vol s, MOD A4C: 39.2 ml LV SV MOD A2C:     56.5 ml LV SV MOD A4C:     115.0 ml LV SV MOD BP:      65.6 ml RIGHT VENTRICLE            IVC RV Basal diam:  4.30 cm    IVC diam:  1.50 cm RV S prime:     9.46 cm/s TAPSE (M-mode): 1.3 cm LEFT ATRIUM             Index        RIGHT ATRIUM           Index LA diam:        4.20 cm 1.67 cm/m   RA Area:     20.80 cm LA Vol (A2C):   52.8 ml 20.95 ml/m  RA Volume:   57.50 ml  22.81 ml/m LA Vol (A4C):   71.1 ml 28.20 ml/m LA Biplane Vol: 65.4 ml 25.94 ml/m  AORTIC VALVE                    PULMONIC VALVE AV Area (Vmax):    3.61 cm     PV Vmax:       1.13 m/s AV Area (Vmean):   3.47 cm     PV Peak grad:  5.1 mmHg AV Area (VTI):     4.61 cm AV Vmax:           119.00 cm/s AV Vmean:          77.700 cm/s AV VTI:            0.148 m AV Peak Grad:      5.7 mmHg AV Mean Grad:      3.0 mmHg LVOT Vmax:         124.00 cm/s LVOT Vmean:        77.900 cm/s LVOT VTI:          0.197 m LVOT/AV VTI ratio: 1.33  AORTA Ao Root diam: 3.90 cm Ao Asc diam:  3.90 cm MITRAL VALVE                TRICUSPID VALVE MV Area (PHT): 9.03 cm     TR Peak grad:   30.0 mmHg MV Area VTI:   2.76 cm     TR Vmax:        274.00 cm/s MV Peak grad:  7.0 mmHg MV Mean grad:  3.0 mmHg     SHUNTS MV Vmax:       1.32 m/s     Systemic VTI:  0.20 m MV Vmean:      75.4 cm/s    Systemic Diam: 2.10 cm MV Decel Time: 84 msec MV E velocity: 102.00 cm/s MV A velocity:  70.10 cm/s MV E/A ratio:  1.46 Dalton McleanMD Electronically signed by Ezra Kanner Signature Date/Time: 01/24/2024/5:26:02 PM    Final    EP STUDY Result Date: 01/24/2024 See surgical note for result.  DG Chest Port 1 View Result Date: 01/24/2024 EXAM: 1 VIEW XRAY OF THE CHEST 01/24/2024 09:38:00 AM COMPARISON: 01/21/2024 CLINICAL HISTORY: Fever FINDINGS: LUNGS AND PLEURA: Slightly low lung volumes. Increased density at the left lung base is favored to be due to overlying soft tissues. Cannot exclude developing airspace disease. No pleural effusion. No pneumothorax. HEART AND MEDIASTINUM: Similar enlarged cardiomediastinal silhouette. BONES AND SOFT TISSUES: Partially imaged left shoulder arthroplasty. Multiple wires and leads project over the chest on the frontal radiograph. IMPRESSION: 1. Low lung volumes. Likely artifactual increased density over the left lung base secondary to overlying soft tissues. Cannot exclude developing left lower lobe airspace disease. Consider PA and lateral radiographs. 2. Cardiomegaly without congestive heart failure. Electronically signed by: Rockey Kilts MD 01/24/2024 10:02 AM EDT RP Workstation: [id number]   DG Chest Port 1 View Result Date: 01/21/2024  CLINICAL DATA:  Atrial fibrillation with rapid ventricular response, left leg swelling EXAM: PORTABLE CHEST 1 VIEW COMPARISON:  07/11/2023 FINDINGS: Single frontal view of the chest demonstrates stable enlargement of the cardiac silhouette. No acute airspace disease, effusion, or pneumothorax. No acute bony abnormalities. IMPRESSION: 1. No acute intrathoracic process. Electronically Signed   By: Ozell Daring M.D.   On: 01/21/2024 17:13   US  Venous Img Lower  Left (DVT Study) Result Date: 01/21/2024 EXAM: ULTRASOUND DUPLEX OF THE LEFT LOWER EXTREMITY VEINS TECHNIQUE: Duplex ultrasound using B-mode/gray scaled imaging and Doppler spectral analysis and color flow was obtained of the deep venous structures of the left  lower extremity. COMPARISON: None. CLINICAL HISTORY: LLE swelling. FINDINGS: The common femoral vein, femoral vein, popliteal vein, and posterior tibial vein of the left lower extremity demonstrate normal compressibility with normal color flow and spectral analysis. Limited images of the contralateral right common femoral vein are negative. There is subcutaneous edema in the left calf. IMPRESSION: 1. No evidence of deep venous thrombosis in the left lower extremity. 2. Subcutaneous edema in the left calf. Electronically signed by: Dayne Hassell MD 01/21/2024 01:08 PM EDT RP Workstation: [id number]    EKG: 9/20 showed AF with RVR (personally reviewed)  TELEMETRY: AF/AFL 80-110s (personally reviewed)  Assessment/Plan:  Paroxysmal AF Paroxysmal AFL Rate control variable.  Echo with EF 55%, RV mildly reduced, trivial MR Likely exacerbated in setting of infection.  Had IRAF after [facility] Monday; and again this am with only 1-3 beats on NSR after 360J shock. Has been on IV amiodarone  Xarelto  dose corrected this am to 20 mg daily Increase lopressor  to 25 mg q 6hrs, continue to titrate as needed  EF normal so can also consider diltiazem   Hesitant to add digoxin with mild AKI and on-going need for diuresis.   Would not currently consider him a candidate for AVJ + PPM. Would allow acute issues to resolve and re-try cardioversion as an outpatient; ideally in 3-4 weeks to avoid need for TEE.   Acute cystitis due to Klebsiella pneumoniae It may be that this will need to completely resolve prior to cardioversion.   CKD III Baseline Cr ~1.6  Acute on chronic diastolic CHF Chronic venous stasis Anasarca Poorly treated OSA Volume status is significantly elevated with third spacing; Severe dependent edema into his flanks.  Not compliant with CPAP.  May need to have HF team help with his diuresis; will discuss with Dr. Kennyth. Lasix  initially stopped 9/22 with mild up-tick in creatinine.  Cr 1.45  today, Peak 1.73 this admission.  Albumin 2.4   Dr. Kennyth to see.   For questions or updates, please contact [facility] Please consult [email or web address] for contact info under     Signed, Ozell Prentice Passey, PA-C  01/26/2024, 9:35 AM     I have seen, examined the patient, and reviewed the above assessment and plan.    HPI: Carlos Hanson is a 83 y.o. male with a history of PAF, on Xarelto , OA, Chronic venous stasis, CAD, GERD, gout, HLD, OSA on CPAP, and obesity who presented to [facility] ED on 9/19 for worsening lower extremity edema x 3 weeks.  He also noted increased urinary frequency.  He was febrile.  Found to have Klebsiella bacteriuria.  Course has been complicated by atrial fibrillation with rapid ventricular rates.  He was started on amiodarone  and underwent cardioversion with immediate recurrence of atrial fibrillation.  Today, he reports feeling near baseline.  He is fatigued but arousable.  Still  concerned by his degree of lower extremity swelling.  No new or acute complaints.  General: Well developed, in no acute distress.  Neck: JVD elevated.  Cardiac: Normal rate, irregular rhythm.  Resp: Normal work of breathing.  Ext: 3+ edema to waist.  Neuro: No gross focal deficits.  Psych: Normal affect.   Assessment: Carlos Hanson is a 83 year old male with past medical history who is currently admitted for acute on chronic diastolic heart failure in the setting of persistent atrial fibrillation.  He is floridly volume overloaded on exam.  Diuresis has been difficult due to fluctuations in his creatinine.  He has been loaded on amiodarone  and multiple cardioversions have been attempted with recurrence of atrial fibrillation.  I suspect his volume overload and infection have been drivers of his increasing AF burden.   Problem List:  Persistent atrial fibrillation Hypercoagulable state due to atrial fibrillation Acute on chronic diastolic heart failure Urinary tract infection  with Klebsiella  Plan:  - Aggressive diuresis.  Consider HF team input for management as I suspect he will need a right heart cath to prove euvolemia given body habitus. - Once euvolemic, can retry cardioversion 1 more time.  If he fails cardioversion when euvolemic, then he will be deemed permanent atrial fibrillation.  Continue amiodarone  in the interim. -Increase lopressor  to 25 mg q 6hrs, continue to titrate as needed. May ultimately be rate control only. - Continue Xarelto  20 mg daily.   Carlos Kitty, MD 01/26/2024 8:40 PM

## 2024-01-26 NOTE — Progress Notes (Signed)
 PROGRESS NOTE Carlos Hanson  FMW:[id number] DOB: Jun 16, 1940 DOA: 01/21/2024 PCP: Yolande Toribio [name], MD  Brief Narrative/Hospital Course: Carlos Hanson is a 83 y.o. male with PMH of paroxysmal A-fib on Xarelto , osteoarthritis, venous stasis insufficiency, chronic back pain, CAD, GERD, hiatal hernia, gout, history of bleeding PUD, hyperlipidemia, obstructive sleep apnea on CPAP, class II obesity and chronic constipation who presented to [facility] for evaluation of worsening leg swelling x 3 weeks. He was seen by PCP 2 days PTA and prescribed Lasix  and advised to continue elevation and compression, without improvement.  In the ED : HR initially in the 50s to 70s, SBP 110-130s, SpO2 96% on room air. Initial labs significant for BUN/creatinine 36/1.57, WBC 12.5 (from 25.36 months ago), Hgb 11.0, lactic acid 0.9, UA shows negative nitrite, moderate leuks, WBC 11-20 and many bacteria. Subsequently went into A-fib with RVR.,  Started on iV Cardizem  and due to soft BP,switched to IV amiodarone  and cardiology was consulted and admitted CXR shows no active disease. U/S LLE negative for DVT. S/p IV Rocephin .  UA with WBC 11-20 urine culture sent Patient continued on amiodarone  drip seen by cardiology 9/22: Underwent cardioversion x 3 without success  Subjective: Seen and examined Overnight remains tachycardic, labs this morning with creatinine further better at 1.4 CBC overall stable No fever since 9/21 This morning went for cardioversion, back in A-fib, Son and daughter-in-law at the bedside -extremity swelling appears much better No new complaints but nit sleepy  Assessment and plan:  Paroxysmal A-fib with RVR: TSH stable, echo 9/22 shows EF 55% RV SF mildly reduced pulmonary artery systolic pressure normal  S/p cardioversion x 3 without success on 9/22 Cont Amio, xarelto , and further plan as per cardiology.  CONT TO Monitor in tele repeat DCCV  9/24 after  additional 48 hrs of Amio was  unsuccessful He will need AVN ablation with PPM-EP cardiology to evaluate.  Acute cystitis due to Klebsiella pneumoniae Fever overnight 9/21: 9/22 rechecked UA normal, chest x-ray with low lung volume question left lower lobe airspace disease.completing empiric antibiotics overall WBC improving no fever.  Blood culture 01/24/24-  no growth to date Recent Labs  Lab 01/21/24 1440 01/22/24 0337 01/23/24 0250 01/24/24 0334 01/25/24 0301 01/26/24 0322  WBC  --  13.2* 11.7* 12.5* 12.3* 11.9*  LATICACIDVEN 0.9  --   --   --   --   --     Lower extremity edema Chronic venous insufficiency: Recently had leg swelling worsening x 3 weeks. Negative for DVT.Appreciate cardiology input, received high-dose Lasix , Lasix  stopped 9/22 due to slightly uptrending creatinine.Creatinine stable today.  Net IO Since Admission: -7,890.54 mL [01/26/24 1121]   Wrist pain acute on chronic: X-ray with DJD continue pain management, ESR elevated-in the setting of infection/GI.  Uric acid normal. Cont  prn hydrocodone .  Normocytic anemia: Mildly low, monitor daily cbc  CKD stage IIIb: Renal function is stable, monitor.baseline ~ 1.6-1.9. Recent Labs    07/11/23 0837 07/11/23 1423 07/12/23 0432 01/21/24 1228 01/22/24 0337 01/23/24 0250 01/24/24 0334 01/25/24 0301 01/26/24 0322  BUN 47* 42* 39* 36* 32* 33* 33* 40* 35*  CREATININE 1.90* 1.85* 1.63* 1.57* 1.48* 1.58* 1.73* 1.68* 1.45*  CO2  --  19* 23 25 27 26 27 28 31   K 5.2* 4.7 4.9 5.1 4.0 4.3 3.8 3.8 3.6   HLD: Continue statin  Hypothyroidism: Is Euthyroid continue home Synthroid   Depression On chronic Aricept  Mood stable , continue  duloxetine , Aricept  gabapentin   GERD: Continue  Pepcid   Chronic constipation: Continue lactulose /Senokot-S  OSA: Continue CPAP at bedtime  DVT prophylaxis: Place TED hose Start: 01/21/24 2316 Code Status:   Code Status: Full Code Family Communication: plan of care discussed with patient at  bedside. Patient status is: Remains hospitalized because of severity of illness Level of care: Progressive   Dispo: The patient is from: home            Anticipated disposition: TBD Objective: Vitals last 24 hrs: Vitals:   01/26/24 0835 01/26/24 0840 01/26/24 0842 01/26/24 0859  BP: (!) 140/92 (!) 140/98 124/67 134/84  Pulse: (!) 112 (!) 123 (!) 121 (!) 130  Resp: 11 14 10 17   Temp:  98.4 F (36.9 C)  98.2 F (36.8 C)  TempSrc:  Oral  Oral  SpO2: 95% 95% 95% 95%  Weight:        Physical Examination: General exam: AAOX3, HOH, on RA. HEENT:Oral mucosa moist, Ear/Nose WNL grossly Respiratory system: Bilaterally clear BS,no use of accessory muscle Cardiovascular system: S1 & S2 +, irregularly irregular, no JVD. Gastrointestinal system: Abdomen soft,NT,ND, BS+ Nervous System: Alert, awake, moving all extremities,and following commands. Extremities: extremities warm, leg edemamild, tender right wrist no erythema Skin: No rashes,no icterus. MSK: Normal muscle bulk,tone, power   Medications reviewed:  Scheduled Meds:  atorvastatin   20 mg Oral Daily   Chlorhexidine  Gluconate Cloth  6 each Topical Daily   donepezil   5 mg Oral QHS   doxycycline   100 mg Oral Q12H   DULoxetine   60 mg Oral BID   famotidine   20 mg Oral Daily   gabapentin   600 mg Oral TID   lactulose   20 g Oral Daily   levothyroxine   50 mcg Oral QAC breakfast   melatonin  10 mg Oral QHS   metoprolol  tartrate  25 mg Oral Q6H   multivitamin  1 tablet Oral Daily   potassium chloride   40 mEq Oral Once   Rivaroxaban   20 mg Oral Q supper   senna-docusate  1 tablet Oral QHS   sodium chloride  flush  10-40 mL Intracatheter Q12H   Continuous Infusions:  amiodarone  60 mg/hr (01/26/24 0820)   cefTRIAXone  (ROCEPHIN )  IV 200 mL/hr at 01/25/24 2340   Diet: Diet Order             Diet NPO time specified Except for: Sips with Meds  Diet effective midnight                   Data Reviewed: I have personally reviewed  following labs and imaging studies ( see epic result tab) CBC: Recent Labs  Lab 01/22/24 0337 01/23/24 0250 01/24/24 0334 01/25/24 0301 01/26/24 0322  WBC 13.2* 11.7* 12.5* 12.3* 11.9*  HGB 10.8* 12.0* 11.8* 11.7* 10.9*  HCT 33.6* 37.5* 36.7* 36.1* 33.8*  MCV 96.0 95.7 93.9 94.3 94.4  PLT 385 328 353 329 307   CMP: Recent Labs  Lab 01/22/24 0337 01/23/24 0250 01/24/24 0334 01/25/24 0301 01/26/24 0322  NA 140 140 137 136 136  K 4.0 4.3 3.8 3.8 3.6  CL 101 101 97* 97* 95*  CO2 27 26 27 28 31   GLUCOSE 98 100* 101* 111* 109*  BUN 32* 33* 33* 40* 35*  CREATININE 1.48* 1.58* 1.73* 1.68* 1.45*  CALCIUM  8.9 8.7* 8.4* 8.3* 8.1*  MG 1.9 1.8  --   --   --    GFR: Estimated Creatinine Clearance: 53.9 mL/min (A) (by C-G formula based on SCr of 1.45 mg/dL (H)). Recent Labs  Lab 01/22/24 0337  AST 13*  ALT 21  ALKPHOS 56  BILITOT 0.6  PROT 5.9*  ALBUMIN 2.4*   No results for input(s): LIPASE, AMYLASE in the last 168 hours. No results for input(s): AMMONIA in the last 168 hours. Coagulation Profile: No results for input(s): INR, PROTIME in the last 168 hours. Unresulted Labs (From admission, onward)     Start     Ordered   01/23/24 0500  Basic metabolic panel with GFR  Daily,   R      01/22/24 0817   01/23/24 0500  CBC  Daily,   R      01/22/24 0817           Antimicrobials/Microbiology: Anti-infectives (From admission, onward)    Start     Dose/Rate Route Frequency Ordered Stop   01/25/24 1000  doxycycline  (VIBRA -TABS) tablet 100 mg        100 mg Oral Every 12 hours 01/25/24 0824 01/30/24 0959   01/24/24 1000  cefTRIAXone  (ROCEPHIN ) 2 g in sodium chloride  0.9 % 100 mL IVPB        2 g 200 mL/hr over 30 Minutes Intravenous Every 24 hours 01/24/24 0904 01/27/24 1359   01/22/24 1800  cefTRIAXone  (ROCEPHIN ) 1 g in sodium chloride  0.9 % 100 mL IVPB  Status:  Discontinued        1 g 200 mL/hr over 30 Minutes Intravenous Every 24 hours 01/21/24 2327 01/24/24  0904   01/21/24 1630  cefTRIAXone  (ROCEPHIN ) 1 g in sodium chloride  0.9 % 100 mL IVPB        1 g 200 mL/hr over 30 Minutes Intravenous  Once 01/21/24 1627 01/21/24 1805         Component Value Date/Time   SDES BLOOD RIGHT ARM 01/24/2024 0939   SDES BLOOD RIGHT ARM 01/24/2024 0939   SPECREQUEST  01/24/2024 0939    BOTTLES DRAWN AEROBIC AND ANAEROBIC Blood Culture results may not be optimal due to an inadequate volume of blood received in culture bottles   SPECREQUEST  01/24/2024 0939    BOTTLES DRAWN AEROBIC AND ANAEROBIC Blood Culture adequate volume   CULT  01/24/2024 0939    NO GROWTH 2 DAYS Performed at [facility], [address], [place], [name] [address]    CULT  01/24/2024 [phone]    NO GROWTH 2 DAYS Performed at [facility], [address], [place], [name] [address]    REPTSTATUS PENDING 01/24/2024 9060   REPTSTATUS PENDING 01/24/2024 9060    Procedures: Procedure(s) (LRB): CARDIOVERSION (N/A)   Mennie LAMY, MD [facility] 01/26/2024, 11:21 AM

## 2024-01-26 NOTE — Interval H&P Note (Signed)
 History and Physical Interval Note:  01/26/2024 7:37 AM  Carlos Hanson  has presented today for surgery, with the diagnosis of afib.  The various methods of treatment have been discussed with the patient and family. After consideration of risks, benefits and other options for treatment, the patient has consented to  Procedure(s): CARDIOVERSION (N/A) as a surgical intervention.  The patient's history has been reviewed, patient examined, no change in status, stable for surgery.  I have reviewed the patient's chart and labs.  Questions were answered to the patient's satisfaction.     [name] A Cardale Dorer

## 2024-01-27 DIAGNOSIS — I4891 Unspecified atrial fibrillation: Secondary | ICD-10-CM | POA: Diagnosis not present

## 2024-01-27 LAB — CBC
HCT: 37.5 % — ABNORMAL LOW (ref 39.0–52.0)
Hemoglobin: 11.9 g/dL — ABNORMAL LOW (ref 13.0–17.0)
MCH: 30 pg (ref 26.0–34.0)
MCHC: 31.7 g/dL (ref 30.0–36.0)
MCV: 94.5 fL (ref 80.0–100.0)
Platelets: 304 K/uL (ref 150–400)
RBC: 3.97 MIL/uL — ABNORMAL LOW (ref 4.22–5.81)
RDW: 15 % (ref 11.5–15.5)
WBC: 8.3 K/uL (ref 4.0–10.5)
nRBC: 0 % (ref 0.0–0.2)

## 2024-01-27 LAB — BASIC METABOLIC PANEL WITH GFR
Anion gap: 11 (ref 5–15)
BUN: 41 mg/dL — ABNORMAL HIGH (ref 8–23)
CO2: 31 mmol/L (ref 22–32)
Calcium: 8 mg/dL — ABNORMAL LOW (ref 8.9–10.3)
Chloride: 95 mmol/L — ABNORMAL LOW (ref 98–111)
Creatinine, Ser: 1.52 mg/dL — ABNORMAL HIGH (ref 0.61–1.24)
GFR, Estimated: 45 mL/min — ABNORMAL LOW (ref >60)
Glucose, Bld: 88 mg/dL (ref 70–99)
Potassium: 3.6 mmol/L (ref 3.5–5.1)
Sodium: 137 mmol/L (ref 135–145)

## 2024-01-27 MED ORDER — METOLAZONE 5 MG PO TABS
2.5000 mg | ORAL_TABLET | Freq: Once | ORAL | Status: AC
  Administered 2024-01-27: 2.5 mg via ORAL
  Filled 2024-01-27: qty 1

## 2024-01-27 MED ORDER — ACETAZOLAMIDE 250 MG PO TABS
500.0000 mg | ORAL_TABLET | Freq: Once | ORAL | Status: AC
Start: 2024-01-27 — End: 2024-01-27
  Administered 2024-01-27: 500 mg via ORAL
  Filled 2024-01-27: qty 2

## 2024-01-27 NOTE — Anesthesia Postprocedure Evaluation (Signed)
 Anesthesia Post Note  Patient: Carlos Hanson  Procedure(s) Performed: CARDIOVERSION     Patient location during evaluation: Cath Lab Anesthesia Type: General Level of consciousness: awake and alert Pain management: pain level controlled Vital Signs Assessment: post-procedure vital signs reviewed and stable Respiratory status: spontaneous breathing, nonlabored ventilation and respiratory function stable Cardiovascular status: blood pressure returned to baseline and stable Postop Assessment: no apparent nausea or vomiting Anesthetic complications: no   No notable events documented.                Hilliary Jock

## 2024-01-27 NOTE — Progress Notes (Signed)
 Advanced Heart Failure Rounding Note  Cardiologist: Gordy Bergamo, MD  Chief Complaint: A/C HFpEF Subjective:    -1L documented yesterday for UOP  Daily weights ordered  Feels ok this morning. CVP 10  Objective:   Weight Range: (!) 138.1 kg Body mass index is 42.46 kg/m.   Vital Signs:   Temp:  [97.5 F (36.4 C)-99.5 F (37.5 C)] 97.5 F (36.4 C) (09/25 0355) Pulse Rate:  [64-106] 89 (09/25 0801) Resp:  [13-20] 20 (09/25 0801) BP: (100-123)/(75-89) 113/76 (09/25 0801) SpO2:  [97 %] 97 % (09/24 1200) FiO2 (%):  [21 %] 21 % (09/24 2141) Last BM Date : 01/22/24  Weight change: Filed Weights   01/23/24 1418  Weight: (!) 138.1 kg    Intake/Output:   Intake/Output Summary (Last 24 hours) at 01/27/2024 1110 Last data filed at 01/26/2024 2330 Gross per 24 hour  Intake 480 ml  Output 1000 ml  Net -520 ml    Physical Exam  General:  elderly appearing.  No respiratory difficulty Neck: JVD difficult to see with thick neck.  Cor: Regular rate & irregular rhythm. No murmurs. Lungs: clear, diminished bases Extremities: +2 BLE edema to hip Neuro: alert & oriented x 3. Affect flat.   Telemetry   A fib 80s-90s (Personally reviewed)    Labs    CBC Recent Labs    01/26/24 0322 01/27/24 0627  WBC 11.9* 8.3  HGB 10.9* 11.9*  HCT 33.8* 37.5*  MCV 94.4 94.5  PLT 307 304   Basic Metabolic Panel Recent Labs    90/75/74 0322 01/27/24 0627  NA 136 137  K 3.6 3.6  CL 95* 95*  CO2 31 31  GLUCOSE 109* 88  BUN 35* 41*  CREATININE 1.45* 1.52*  CALCIUM  8.1* 8.0*   Liver Function Tests No results for input(s): AST, ALT, ALKPHOS, BILITOT, PROT, ALBUMIN in the last 72 hours. No results for input(s): LIPASE, AMYLASE in the last 72 hours. Cardiac Enzymes No results for input(s): CKTOTAL, CKMB, CKMBINDEX, TROPONINI in the last 72 hours.  BNP: BNP (last 3 results) Recent Labs    07/11/23 0653  BNP 125.3*    ProBNP (last 3 results) No  results for input(s): PROBNP in the last 8760 hours.   D-Dimer No results for input(s): DDIMER in the last 72 hours. Hemoglobin A1C No results for input(s): HGBA1C in the last 72 hours. Fasting Lipid Panel No results for input(s): CHOL, HDL, LDLCALC, TRIG, CHOLHDL, LDLDIRECT in the last 72 hours. Thyroid  Function Tests No results for input(s): TSH, T4TOTAL, T3FREE, THYROIDAB in the last 72 hours.  Invalid input(s): FREET3  Other results:   Imaging    No results found.   Medications:     Scheduled Medications:  atorvastatin   20 mg Oral Daily   Chlorhexidine  Gluconate Cloth  6 each Topical Daily   donepezil   5 mg Oral QHS   doxycycline   100 mg Oral Q12H   DULoxetine   60 mg Oral BID   famotidine   20 mg Oral Daily   furosemide   80 mg Intravenous BID   gabapentin   600 mg Oral TID   lactulose   20 g Oral Daily   levothyroxine   50 mcg Oral QAC breakfast   melatonin  10 mg Oral QHS   metoprolol  tartrate  25 mg Oral Q6H   multivitamin  1 tablet Oral Daily   Rivaroxaban   20 mg Oral Q supper   senna-docusate  1 tablet Oral QHS   sodium chloride  flush  10-40  mL Intracatheter Q12H    Infusions:  amiodarone  60 mg/hr (01/27/24 0852)   cefTRIAXone  (ROCEPHIN )  IV 2 g (01/26/24 1340)    PRN Medications: acetaminophen , diclofenac  Sodium, HYDROcodone -acetaminophen , ondansetron  (ZOFRAN ) IV, polyethylene glycol, sodium chloride  flush    Patient Profile   Carlos Hanson is a 83 y.o. male with chronic HFpEF, PAF on Xarelto , OA, venous stasis, CAD, GERD, HLD, OSA on CPAP and obesity. AHF team to see with A/C HFpEF.   Assessment/Plan   A/C HFpEF - Echo 9/25: EF 55%. Mild concentric LVH, RV mildly reduced, RA mildly dilated, trivial MR, mild dilatation of aortic root 39 mm. Mild dilatation of ascending aorta 39 mm. Small pericardial effusion present.  - NYHA IV on admission - Volume overloaded on exam. Continue lasix  80 IV BID. CVP 10. Co-ox marginal at  55. Avoiding inotrope's in the setting of a fib RVR - Place UNNA boots - BB increased for per EP, may need to decrease while volume overloaded. Follow response.  - Strict I&O. Daily weights    A fib/Flu RVR - EP following - Failed DCCV x2 in the setting of volume overload - Continue xarelto  - Continue IV amiodarone  - Currently not a candidate for AVJ + PPM with acute conditions.  - Consider repeat DCCV once diuresed   CKD stage 3a - Baseline SCr ~ 1.4-1.6 - Continue diuresis - avoid hypotension   Acute cystitis d/t Klebsiella pneumoniae - mgmt per primary team - Continue abx   OSA noncompliant with CPAP - Discussed importance of compliance    Morbid obesity - Body mass index is 42.46 kg/m.    Needs to mobilize. PT consulted.   Length of Stay: 6  Beckey LITTIE Coe, NP  01/27/2024, 11:10 AM  Advanced Heart Failure Team Pager [phone] (M-F; 7a - 5p)  Please contact [facility] for night-coverage after hours (5p -7a ) and weekends on [email or web address]

## 2024-01-27 NOTE — Evaluation (Signed)
 Physical Therapy Evaluation Patient Details Name: Carlos Hanson MRN: [id number] DOB: Jun 07, 1940 Today's Date: 01/27/2024  History of Present Illness  Carlos Hanson is a 83 y.o. male  who presented to [facility] ED for evaluation of worsening leg swelling x 3 weeks. He was seen by PCP 2 days PTA and prescribed Lasix  and advised to continue elevation and compression, without improvement. Admitted to [facility] 9/19 due to Afib with RVR, CHF.  Cardioversion 9/22.   PMH of paroxysmal A-fib on Xarelto , osteoarthritis, venous stasis insufficiency, chronic back pain, CAD, GERD, hiatal hernia, gout, history of bleeding PUD, hyperlipidemia, obstructive sleep apnea on CPAP, class II obesity and chronic constipation  Clinical Impression  Pt admitted with above diagnosis. Pt was too lethargic to move to EOB.  Pt answered some questions and able to assess UE and LEs however could not arouse pt enough to get pt to EOB with 1 person assist. Notified nurse of pts lethargy. Will likely need post acute rehab < 3 hours day as note that pt was needing incr assist with ambulation PTA and was declining at his facility. Will follow acutely.  Pt currently with functional limitations due to the deficits listed below (see PT Problem List). Pt will benefit from acute skilled PT to increase their independence and safety with mobility to allow discharge.           If plan is discharge home, recommend the following: A lot of help with walking and/or transfers;A lot of help with bathing/dressing/bathroom;Assistance with cooking/housework;Help with stairs or ramp for entrance;Assist for transportation   Can travel by private vehicle   No    Equipment Recommendations None recommended by PT  Recommendations for Other Services       Functional Status Assessment Patient has had a recent decline in their functional status and demonstrates the ability to make significant improvements in function in a reasonable and predictable amount of time.      Precautions / Restrictions Restrictions Weight Bearing Restrictions Per Provider Order: No      Mobility  Bed Mobility Overal bed mobility: Needs Assistance Bed Mobility: Rolling, Sidelying to Sit Rolling: Total assist Sidelying to sit: Total assist       General bed mobility comments: Unable to get pt to sitting with 1 person assist. Pt lethargic and not assisting.    Transfers                        Ambulation/Gait                  Stairs            Wheelchair Mobility     Tilt Bed    Modified Rankin (Stroke Patients Only)       Balance                                             Pertinent Vitals/Pain Pain Assessment Pain Assessment: No/denies pain    Home Living Family/patient expects to be discharged to:: Private residence Living Arrangements: Spouse/significant other Available Help at Discharge: Family Type of Home: Independent living facility Home Access: Level entry         Home Equipment: Cane - single point;Grab bars - tub/shower;Shower seat - built in;Rollator (4 wheels);Lift chair;Hand held shower head      Prior Function Prior Level of Function :  History of Falls (last six months);Needs assist             Mobility Comments: wife and son report falls recently, was using rollator and needing assist recently. ADLs Comments: Needing assist recently     Extremity/Trunk Assessment   Upper Extremity Assessment Upper Extremity Assessment: Defer to OT evaluation    Lower Extremity Assessment Lower Extremity Assessment: RLE deficits/detail;LLE deficits/detail RLE Deficits / Details: grossly 2/5, pt with difficulty following commands LLE Deficits / Details: grossly 2/5, pt with difficulty following commands       Communication   Communication Communication: Impaired Factors Affecting Communication: Reduced clarity of speech    Cognition Arousal: Lethargic Behavior During Therapy:  Flat affect                           PT - Cognition Comments: Difficult to assess as pt lethargic Following commands: Impaired Following commands impaired: Follows one step commands with increased time     Cueing       General Comments      Exercises     Assessment/Plan    PT Assessment Patient needs continued PT services  PT Problem List Decreased activity tolerance;Decreased balance;Decreased mobility;Decreased strength;Decreased range of motion;Decreased knowledge of use of DME;Decreased safety awareness;Decreased knowledge of precautions;Cardiopulmonary status limiting activity       PT Treatment Interventions DME instruction;Functional mobility training;Therapeutic activities;Therapeutic exercise;Balance training;Patient/family education;Wheelchair mobility training;Gait training    PT Goals (Current goals can be found in the Care Plan section)  Acute Rehab PT Goals Patient Stated Goal: unable to state PT Goal Formulation: Patient unable to participate in goal setting Time For Goal Achievement: 02/10/24 Potential to Achieve Goals: Fair    Frequency Min 2X/week     Co-evaluation               AM-PAC PT 6 Clicks Mobility  Outcome Measure Help needed turning from your back to your side while in a flat bed without using bedrails?: Total Help needed moving from lying on your back to sitting on the side of a flat bed without using bedrails?: Total Help needed moving to and from a bed to a chair (including a wheelchair)?: Total Help needed standing up from a chair using your arms (e.g., wheelchair or bedside chair)?: Total Help needed to walk in hospital room?: Total Help needed climbing 3-5 steps with a railing? : Total 6 Click Score: 6    End of Session Equipment Utilized During Treatment: Gait belt Activity Tolerance: Patient limited by fatigue Patient left: in bed;with call bell/phone within reach;with bed alarm set Nurse Communication:  Mobility status;Need for lift equipment (informed nurse of pts lethargy and PT unable to get pt to EOB) PT Visit Diagnosis: Muscle weakness (generalized) (M62.81)    Time: 8975-8965 PT Time Calculation (min) (ACUTE ONLY): 10 min   Charges:   PT Evaluation $PT Eval Moderate Complexity: 1 Mod   PT General Charges $$ ACUTE PT VISIT: 1 Visit         Clifford Benninger M,PT Acute Rehab Services [phone]   Stephane JULIANNA Bevel 01/27/2024, 2:37 PM

## 2024-01-27 NOTE — Anesthesia Preprocedure Evaluation (Signed)
 Anesthesia Evaluation  Patient identified by MRN, date of birth, ID band Patient awake    Reviewed: Allergy & Precautions, NPO status , Patient's Chart, lab work & pertinent test results  Airway Mallampati: III  TM Distance: >3 FB Neck ROM: Full    Dental  (+) Edentulous Upper, Edentulous Lower, Dental Advisory Given   Pulmonary sleep apnea and Continuous Positive Airway Pressure Ventilation , former smoker   breath sounds clear to auscultation       Cardiovascular hypertension, Pt. on medications + CAD and +CHF  + dysrhythmias (xarelto ) Atrial Fibrillation  Rhythm:Irregular     Neuro/Psych  PSYCHIATRIC DISORDERS  Depression    negative neurological ROS     GI/Hepatic Neg liver ROS, hiatal hernia,GERD  ,,  Endo/Other  Hypothyroidism  Class 3 obesity (BMI 43)  Renal/GU Renal InsufficiencyRenal disease     Musculoskeletal  (+) Arthritis ,    Abdominal   Peds  Hematology negative hematology ROS (+)   Anesthesia Other Findings   Reproductive/Obstetrics                              Anesthesia Physical Anesthesia Plan  ASA: 3  Anesthesia Plan: General   Post-op Pain Management:    Induction: Intravenous  PONV Risk Score and Plan: Treatment may vary due to age or medical condition  Airway Management Planned: Nasal Cannula, Natural Airway and Simple Face Mask  Additional Equipment: None  Intra-op Plan:   Post-operative Plan:   Informed Consent: I have reviewed the patients History and Physical, chart, labs and discussed the procedure including the risks, benefits and alternatives for the proposed anesthesia with the patient or authorized representative who has indicated his/her understanding and acceptance.     Dental advisory given  Plan Discussed with: CRNA  Anesthesia Plan Comments:          Anesthesia Quick Evaluation

## 2024-01-27 NOTE — Progress Notes (Signed)
 PROGRESS NOTE LUCERO IDE  FMW:[id number] DOB: 1940/10/04 DOA: 01/21/2024 PCP: Yolande Toribio [name], MD  Brief Narrative/Hospital Course: Carlos Hanson is a 83 y.o. male with PMH of paroxysmal A-fib on Xarelto , osteoarthritis, venous stasis insufficiency, chronic back pain, CAD, GERD, hiatal hernia, gout, history of bleeding PUD, hyperlipidemia, obstructive sleep apnea on CPAP, class II obesity and chronic constipation who presented to [facility] for evaluation of worsening leg swelling x 3 weeks. He was seen by PCP 2 days PTA and prescribed Lasix  and advised to continue elevation and compression, without improvement.  In the ED : HR initially in the 50s to 70s, SBP 110-130s, SpO2 96% on room air. Initial labs significant for BUN/creatinine 36/1.57, WBC 12.5 (from 25.36 months ago), Hgb 11.0, lactic acid 0.9, UA shows negative nitrite, moderate leuks, WBC 11-20 and many bacteria. Subsequently went into A-fib with RVR.,  Started on iV Cardizem  and due to soft BP,switched to IV amiodarone  and cardiology was consulted and admitted CXR shows no active disease. U/S LLE negative for DVT. S/p IV Rocephin .  UA with WBC 11-20 urine culture sent Patient continued on amiodarone  drip seen by cardiology 9/22: Underwent cardioversion x 3 without success 9/24: DCCV again unsuccessful  Subjective: Seen and examined today Hard of hearing,  pleasant, Patient denies any nausea, vomiting, chest pain, shortness of breath Overnight heart rate seems to have improved in 80s but still in a fib  Afebrile. BP stable  Labs shows creatinine about the same 1.5 CBC stable WBC normalized Agreeable for PICC this am  Assessment and plan:  Paroxysmal A-fib with RVR: TSH stable, echo 9/22 shows EF 55% RV SF mildly reduced pulmonary artery systolic pressure normal  S/p cardioversion x 3 without success on 9/22. Repeat DCCV  9/24 after  additional 48 hrs of Amio was unsuccessful He will likely need AVN ablation with PPM-EP  cardiology following, but would like to optimize volume status before further plan Continue diuretics per HF team, metoprolol  25 every 6 hours, Xarelto , amiodarone  infusion  Acute cystitis due to Klebsiella pneumoniae Fever overnight 9/21: 9/22 rechecked UA normal, chest x-ray with low lung volume question left lower lobe airspace disease.completing empiric antibiotics overall afebrile, leukocytosis improved Blood culture 01/24/24-  no growth to date Recent Labs  Lab 01/21/24 1440 01/22/24 0337 01/23/24 0250 01/24/24 0334 01/25/24 0301 01/26/24 0322 01/27/24 0627  WBC  --    < > 11.7* 12.5* 12.3* 11.9* 8.3  LATICACIDVEN 0.9  --   --   --   --   --   --    < > = values in this interval not displayed.    Acute on chronic HFpEF Lower extremity edema Chronic venous insufficiency: Recently had leg swelling worsening x 3 weeks. Negative for DVT.Appreciate cardiology input, received high-dose Lasix , Lasix  stopped 9/22 due to slightly uptrending creatinine.creatinine has been stable since due to concern for A-fib RVR in the setting of fluid overload AHF team consulted 9/24 now on aggressive diuresis 80 mg iv bid  w/ PICC line CVP monitoring, coaax monitoring per cardio. Net IO Since Admission: -8,410.54 mL [01/27/24 0953]   Wrist pain acute on chronic: X-ray with DJD continue pain management, ESR elevated-in the setting of infection/GI.  Uric acid normal. Cont  prn hydrocodone .  Normocytic anemia: Mildly low, monitor daily cbc  CKD stage IIIb: Renal function is stable, monitor.baseline ~ 1.6-1.9. Recent Labs    07/11/23 9162 07/11/23 1423 07/12/23 9567 01/21/24 1228 01/22/24 9662 01/23/24 0250 01/24/24 9665 01/25/24 0301  01/26/24 0322 01/27/24 0627  BUN 47* 42* 39* 36* 32* 33* 33* 40* 35* 41*  CREATININE 1.90* 1.85* 1.63* 1.57* 1.48* 1.58* 1.73* 1.68* 1.45* 1.52*  CO2  --  19* 23 25 27 26 27 28 31 31   K 5.2* 4.7 4.9 5.1 4.0 4.3 3.8 3.8 3.6 3.6   HLD: Continue  statin  Hypothyroidism: Is Euthyroid continue home Synthroid   Depression On chronic Aricept  Mood stable , continue  duloxetine , Aricept  gabapentin   GERD: Continue Pepcid   Chronic constipation: Continue lactulose /Senokot-S  OSA: Continue CPAP at bedtime  DVT prophylaxis: Place TED hose Start: 01/21/24 2316 Code Status:   Code Status: Full Code Family Communication: plan of care discussed with patient at bedside. Patient status is: Remains hospitalized because of severity of illness Level of care: Progressive   Dispo: The patient is from: home            Anticipated disposition: TBD Objective: Vitals last 24 hrs: Vitals:   01/27/24 0039 01/27/24 0355 01/27/24 0530 01/27/24 0801  BP: 100/75 115/82 115/82 113/76  Pulse:    89  Resp: 18 17  20   Temp: (!) 97.5 F (36.4 C) (!) 97.5 F (36.4 C)    TempSrc: Oral Oral  Oral  SpO2:      Weight:      Height:        Physical Examination: General exam: AAOX3, obese, hard of hearing. HEENT:Oral mucosa moist, Ear/Nose WNL grossly Respiratory system: Bilaterally diminished BS at base,no use of accessory muscle Cardiovascular system: S1 & S2 +, irregularly irregular Gastrointestinal system: Abdomen soft,NT,ND, BS+ Nervous System: Alert, awake, moving all extremities,and following commands. Extremities: extremities warm, leg edema ++, tender right wrist no erythema, UE swollen. Skin: No rashes,no icterus. MSK: Normal muscle bulk,tone, power   Medications reviewed:  Scheduled Meds:  atorvastatin   20 mg Oral Daily   Chlorhexidine  Gluconate Cloth  6 each Topical Daily   donepezil   5 mg Oral QHS   doxycycline   100 mg Oral Q12H   DULoxetine   60 mg Oral BID   famotidine   20 mg Oral Daily   furosemide   80 mg Intravenous BID   gabapentin   600 mg Oral TID   lactulose   20 g Oral Daily   levothyroxine   50 mcg Oral QAC breakfast   melatonin  10 mg Oral QHS   metoprolol  tartrate  25 mg Oral Q6H   multivitamin  1 tablet Oral Daily    Rivaroxaban   20 mg Oral Q supper   senna-docusate  1 tablet Oral QHS   sodium chloride  flush  10-40 mL Intracatheter Q12H   Continuous Infusions:  amiodarone  60 mg/hr (01/27/24 0852)   cefTRIAXone  (ROCEPHIN )  IV 2 g (01/26/24 1340)   Diet: Diet Order             Diet Heart Room service appropriate? Yes; Fluid consistency: Thin  Diet effective now                   Data Reviewed: I have personally reviewed following labs and imaging studies ( see epic result tab) CBC: Recent Labs  Lab 01/23/24 0250 01/24/24 0334 01/25/24 0301 01/26/24 0322 01/27/24 0627  WBC 11.7* 12.5* 12.3* 11.9* 8.3  HGB 12.0* 11.8* 11.7* 10.9* 11.9*  HCT 37.5* 36.7* 36.1* 33.8* 37.5*  MCV 95.7 93.9 94.3 94.4 94.5  PLT 328 353 329 307 304   CMP: Recent Labs  Lab 01/22/24 0337 01/23/24 0250 01/24/24 0334 01/25/24 0301 01/26/24 0322 01/27/24 0627  NA  140 140 137 136 136 137  K 4.0 4.3 3.8 3.8 3.6 3.6  CL 101 101 97* 97* 95* 95*  CO2 27 26 27 28 31 31   GLUCOSE 98 100* 101* 111* 109* 88  BUN 32* 33* 33* 40* 35* 41*  CREATININE 1.48* 1.58* 1.73* 1.68* 1.45* 1.52*  CALCIUM  8.9 8.7* 8.4* 8.3* 8.1* 8.0*  MG 1.9 1.8  --   --   --   --    GFR: Estimated Creatinine Clearance: 51.4 mL/min (A) (by C-G formula based on SCr of 1.52 mg/dL (H)). Recent Labs  Lab 01/22/24 0337  AST 13*  ALT 21  ALKPHOS 56  BILITOT 0.6  PROT 5.9*  ALBUMIN 2.4*   No results for input(s): LIPASE, AMYLASE in the last 168 hours. No results for input(s): AMMONIA in the last 168 hours. Coagulation Profile: No results for input(s): INR, PROTIME in the last 168 hours. Unresulted Labs (From admission, onward)    None      Antimicrobials/Microbiology: Anti-infectives (From admission, onward)    Start     Dose/Rate Route Frequency Ordered Stop   01/25/24 1000  doxycycline  (VIBRA -TABS) tablet 100 mg        100 mg Oral Every 12 hours 01/25/24 0824 01/30/24 0959   01/24/24 1000  cefTRIAXone  (ROCEPHIN ) 2 g in  sodium chloride  0.9 % 100 mL IVPB        2 g 200 mL/hr over 30 Minutes Intravenous Every 24 hours 01/24/24 0904 01/27/24 1359   01/22/24 1800  cefTRIAXone  (ROCEPHIN ) 1 g in sodium chloride  0.9 % 100 mL IVPB  Status:  Discontinued        1 g 200 mL/hr over 30 Minutes Intravenous Every 24 hours 01/21/24 2327 01/24/24 0904   01/21/24 1630  cefTRIAXone  (ROCEPHIN ) 1 g in sodium chloride  0.9 % 100 mL IVPB        1 g 200 mL/hr over 30 Minutes Intravenous  Once 01/21/24 1627 01/21/24 1805         Component Value Date/Time   SDES BLOOD RIGHT ARM 01/24/2024 0939   SDES BLOOD RIGHT ARM 01/24/2024 0939   SPECREQUEST  01/24/2024 0939    BOTTLES DRAWN AEROBIC AND ANAEROBIC Blood Culture results may not be optimal due to an inadequate volume of blood received in culture bottles   SPECREQUEST  01/24/2024 0939    BOTTLES DRAWN AEROBIC AND ANAEROBIC Blood Culture adequate volume   CULT  01/24/2024 0939    NO GROWTH 3 DAYS Performed at [facility], [address] [place], [name] [address]    CULT  01/24/2024 [phone]    NO GROWTH 3 DAYS Performed at [facility], [address], [place], [name] [address]    REPTSTATUS PENDING 01/24/2024 9060   REPTSTATUS PENDING 01/24/2024 9060    Procedures: Procedure(s) (LRB): CARDIOVERSION (N/A)   Mennie LAMY, MD [facility] 01/27/2024, 11:10 AM

## 2024-01-28 DIAGNOSIS — I4891 Unspecified atrial fibrillation: Secondary | ICD-10-CM | POA: Diagnosis not present

## 2024-01-28 LAB — BASIC METABOLIC PANEL WITH GFR
Anion gap: 13 (ref 5–15)
BUN: 49 mg/dL — ABNORMAL HIGH (ref 8–23)
CO2: 29 mmol/L (ref 22–32)
Calcium: 8.4 mg/dL — ABNORMAL LOW (ref 8.9–10.3)
Chloride: 92 mmol/L — ABNORMAL LOW (ref 98–111)
Creatinine, Ser: 1.92 mg/dL — ABNORMAL HIGH (ref 0.61–1.24)
GFR, Estimated: 34 mL/min — ABNORMAL LOW (ref >60)
Glucose, Bld: 102 mg/dL — ABNORMAL HIGH (ref 70–99)
Potassium: 3.2 mmol/L — ABNORMAL LOW (ref 3.5–5.1)
Sodium: 134 mmol/L — ABNORMAL LOW (ref 135–145)

## 2024-01-28 MED ORDER — ALTEPLASE 2 MG IJ SOLR
2.0000 mg | Freq: Once | INTRAMUSCULAR | Status: AC
  Administered 2024-01-28: 2 mg
  Filled 2024-01-28: qty 2

## 2024-01-28 MED ORDER — POTASSIUM CHLORIDE CRYS ER 20 MEQ PO TBCR
40.0000 meq | EXTENDED_RELEASE_TABLET | Freq: Once | ORAL | Status: AC
  Administered 2024-01-28: 40 meq via ORAL
  Filled 2024-01-28: qty 2

## 2024-01-28 NOTE — Progress Notes (Signed)
 Advanced Heart Failure Rounding Note  Cardiologist: Gordy Bergamo, MD  Chief Complaint: A/C HFpEF Subjective:    -2.1L documented yesterday for UOP  No labs this morning. Will order BMET  Feels ok this am. Nursing at bedside . CVP 11  Objective:   Weight Range: 120.2 kg Body mass index is 36.96 kg/m.   Vital Signs:   Temp:  [96.6 F (35.9 C)-98.8 F (37.1 C)] 97.5 F (36.4 C) (09/26 0758) Pulse Rate:  [66-102] 89 (09/26 0758) Resp:  [17-20] 17 (09/26 0758) BP: (95-127)/(69-96) 95/69 (09/26 0758) SpO2:  [92 %-98 %] 92 % (09/26 0758) Weight:  [120.2 kg] 120.2 kg (09/26 0407) Last BM Date : 01/27/24  Weight change: Filed Weights   01/23/24 1418 01/28/24 0407  Weight: (!) 138.1 kg 120.2 kg    Intake/Output:   Intake/Output Summary (Last 24 hours) at 01/28/2024 0814 Last data filed at 01/28/2024 0500 Gross per 24 hour  Intake 714.48 ml  Output 2100 ml  Net -1385.52 ml    Physical Exam  General:  elderly appearing.  No respiratory difficulty Neck: JVD difficult to see with thick neck.  Cor: Regular rate & irregular rhythm. No murmurs. Lungs: clear, diminished bases Extremities: +2 edema from knee to hip. +1 in BLE Neuro: alert & oriented x 3. Affect flat.   Telemetry   A fib 70s (Personally reviewed)    Labs    CBC Recent Labs    01/26/24 0322 01/27/24 0627  WBC 11.9* 8.3  HGB 10.9* 11.9*  HCT 33.8* 37.5*  MCV 94.4 94.5  PLT 307 304   Basic Metabolic Panel Recent Labs    90/75/74 0322 01/27/24 0627  NA 136 137  K 3.6 3.6  CL 95* 95*  CO2 31 31  GLUCOSE 109* 88  BUN 35* 41*  CREATININE 1.45* 1.52*  CALCIUM  8.1* 8.0*   Liver Function Tests No results for input(s): AST, ALT, ALKPHOS, BILITOT, PROT, ALBUMIN in the last 72 hours. No results for input(s): LIPASE, AMYLASE in the last 72 hours. Cardiac Enzymes No results for input(s): CKTOTAL, CKMB, CKMBINDEX, TROPONINI in the last 72 hours.  BNP: BNP (last 3  results) Recent Labs    07/11/23 0653  BNP 125.3*    ProBNP (last 3 results) No results for input(s): PROBNP in the last 8760 hours.   D-Dimer No results for input(s): DDIMER in the last 72 hours. Hemoglobin A1C No results for input(s): HGBA1C in the last 72 hours. Fasting Lipid Panel No results for input(s): CHOL, HDL, LDLCALC, TRIG, CHOLHDL, LDLDIRECT in the last 72 hours. Thyroid  Function Tests No results for input(s): TSH, T4TOTAL, T3FREE, THYROIDAB in the last 72 hours.  Invalid input(s): FREET3  Other results:   Imaging    No results found.   Medications:     Scheduled Medications:  atorvastatin   20 mg Oral Daily   Chlorhexidine  Gluconate Cloth  6 each Topical Daily   donepezil   5 mg Oral QHS   doxycycline   100 mg Oral Q12H   DULoxetine   60 mg Oral BID   famotidine   20 mg Oral Daily   furosemide   80 mg Intravenous BID   gabapentin   600 mg Oral TID   lactulose   20 g Oral Daily   levothyroxine   50 mcg Oral QAC breakfast   melatonin  10 mg Oral QHS   metoprolol  tartrate  25 mg Oral Q6H   multivitamin  1 tablet Oral Daily   Rivaroxaban   20 mg Oral Q supper  senna-docusate  1 tablet Oral QHS   sodium chloride  flush  10-40 mL Intracatheter Q12H    Infusions:  amiodarone  60 mg/hr (01/28/24 0537)    PRN Medications: acetaminophen , diclofenac  Sodium, HYDROcodone -acetaminophen , ondansetron  (ZOFRAN ) IV, polyethylene glycol, sodium chloride  flush    Patient Profile   Carlos Hanson is a 83 y.o. male with chronic HFpEF, PAF on Xarelto , OA, venous stasis, CAD, GERD, HLD, OSA on CPAP and obesity. AHF team to see with A/C HFpEF.   Assessment/Plan   A/C HFpEF - Echo 9/25: EF 55%. Mild concentric LVH, RV mildly reduced, RA mildly dilated, trivial MR, mild dilatation of aortic root 39 mm. Mild dilatation of ascending aorta 39 mm. Small pericardial effusion present.  - NYHA IV on admission - Volume overloaded on exam. Continue lasix   80 IV BID. CVP 11.  - Co-ox marginal 9/24 at 55%. Avoiding inotrope's in the setting of a fib RVR - Continue UNNA boots  - BB per EP - Strict I&O. Daily weights    A fib/Flu RVR - EP following - Failed DCCV x2 in the setting of volume overload - Continue xarelto  - Continue IV amiodarone  - Currently not a candidate for AVJ + PPM with acute conditions.  - Consider repeat DCCV once diuresed   CKD stage 3a - Baseline SCr ~ 1.4-1.6 - Continue diuresis - Check BMET today - avoid hypotension   Acute cystitis d/t Klebsiella pneumoniae - mgmt per primary team - Continue abx   OSA noncompliant with CPAP - Discussed importance of compliance    Morbid obesity - Body mass index is 42.46 kg/m.    Needs to mobilize. PT now following.   Length of Stay: 7  Carlos LITTIE Coe, NP  01/28/2024, 8:14 AM  Advanced Heart Failure Team Pager [phone] (M-F; 7a - 5p)  Please contact [facility] for night-coverage after hours (5p -7a ) and weekends on [email or web address]

## 2024-01-28 NOTE — Progress Notes (Signed)
 Physical Therapy Treatment Patient Details Name: Carlos Hanson MRN: [id number] DOB: November 01, 1940 Today's Date: 01/28/2024   History of Present Illness Carlos Hanson is a 83 y.o. male  who presented to [facility] 9/19 for evaluation of worsening leg swelling x 3 weeks.Admitted to [facility] 9/19 due to Afib with RVR, CHF.  Cardioversion 9/22.   PMH of paroxysmal A-fib on Xarelto , osteoarthritis, venous stasis insufficiency, chronic back pain, CAD, GERD, hiatal hernia, gout, history of bleeding PUD, hyperlipidemia, obstructive sleep apnea on CPAP, class II obesity and chronic constipation    PT Comments  More interactive today. Able to initiate LEs to EOB to rise with up to max assist for trunk support. Spent a fair amount of time on EOB working on seated balance with CGA-min A at times. Max assist to stand from high bed surface and Stedy. Pt had episode of bowel incontinence; required Total assist +2 to lift from [facility] due to fatigue after assisting with peri-care and finished hygiene in bed with nursing assist. Patient will continue to benefit from skilled physical therapy services to further improve independence with functional mobility. Patient will benefit from continued inpatient follow up therapy, <3 hours/day    If plan is discharge home, recommend the following: Assistance with cooking/housework;Help with stairs or ramp for entrance;Assist for transportation;Two people to help with walking and/or transfers;Two people to help with bathing/dressing/bathroom   Can travel by private vehicle     No  Equipment Recommendations  Hospital bed;Hoyer lift;Wheelchair cushion (measurements PT);Wheelchair (measurements PT)    Recommendations for Other Services       Precautions / Restrictions Precautions Precautions: Fall Recall of Precautions/Restrictions: Impaired Restrictions Weight Bearing Restrictions Per Provider Order: No     Mobility  Bed Mobility Overal bed mobility: Needs Assistance Bed  Mobility: Rolling, Sidelying to Sit, Sit to Sidelying Rolling: Mod assist, Used rails Sidelying to sit: Max assist, HOB elevated, Used rails     Sit to sidelying: Max assist General bed mobility comments: Mod assist to roll left and right in bed several times for peri-care with nursing staff assisting therapist. Pt able to reach for rail with guidance, bring LEs over. Max assist for trunk support to rise and Max assist for LE support back into bed (able to lie onto his left side with cues.)    Transfers Overall transfer level: Needs assistance Equipment used: Ambulation equipment used Transfers: Sit to/from Stand Sit to Stand: Total assist, +2 safety/equipment, +2 physical assistance, From elevated surface, Via lift equipment           General transfer comment: Patient able to stand with 1 person assist from high bed surface using stedy (2nd attempt.) Had bowel movement before or during transition, Required +2 total assist assist to stand back up from [facility] to remove linens and paddles to sit back down on bed for further peri-care. Transfer via Lift Equipment: Stedy  Ambulation/Gait                   Stairs             Wheelchair Mobility     Tilt Bed    Modified Rankin (Stroke Patients Only)       Balance Overall balance assessment: Needs assistance Sitting-balance support: Bilateral upper extremity supported, Feet supported Sitting balance-Leahy Scale: Poor Sitting balance - Comments: CGA to min assist sitting EOB with UEs supported.   Standing balance support: Bilateral upper extremity supported, Reliant on assistive device for balance Standing balance-Leahy Scale:  Zero Standing balance comment: Patent examiner Communication: Impaired Factors Affecting Communication: Reduced clarity of speech  Cognition Arousal: Lethargic Behavior During Therapy: Flat affect, Agitated   PT - Cognitive  impairments: Difficult to assess, Awareness, Attention, Sequencing, Problem solving, Safety/Judgement Difficult to assess due to:  (not answering questions consistently.)                     PT - Cognition Comments: Oriented to place, and that his wife is present. Following commands: Impaired Following commands impaired: Follows one step commands with increased time, Follows one step commands inconsistently    Cueing Cueing Techniques: Verbal cues, Gestural cues, Tactile cues, Visual cues  Exercises Other Exercises Other Exercises: Worked on seated balance EOB and on Stedy. Pt leans forward onto horizontal bar when he fatigues. Frequent cues and assist for upright posture on Stedy    General Comments General comments (skin integrity, edema, etc.): Peri-care/hygiene performed with assist from nursing staff on [place] and further once in bed. SpO2 94% oN RA, HR 79, BP 120/63      Pertinent Vitals/Pain Pain Assessment Pain Assessment: Faces Faces Pain Scale: Hurts whole lot Pain Location: back, with sitting and mobility. Pain Descriptors / Indicators: Aching Pain Intervention(s): Limited activity within patient's tolerance, Monitored during session, Repositioned    Home Living                          Prior Function            PT Goals (current goals can now be found in the care plan section) Acute Rehab PT Goals Patient Stated Goal: unable to state PT Goal Formulation: With family Time For Goal Achievement: 02/10/24 Potential to Achieve Goals: Fair Progress towards PT goals: Progressing toward goals    Frequency    Min 2X/week      PT Plan      Co-evaluation              AM-PAC PT 6 Clicks Mobility   Outcome Measure  Help needed turning from your back to your side while in a flat bed without using bedrails?: A Lot Help needed moving from lying on your back to sitting on the side of a flat bed without using bedrails?: Total Help needed  moving to and from a bed to a chair (including a wheelchair)?: Total Help needed standing up from a chair using your arms (e.g., wheelchair or bedside chair)?: Total Help needed to walk in hospital room?: Total Help needed climbing 3-5 steps with a railing? : Total 6 Click Score: 7    End of Session Equipment Utilized During Treatment: Gait belt Activity Tolerance: Patient limited by fatigue Patient left: in bed;with call bell/phone within reach;with bed alarm set;with family/visitor present Nurse Communication: Mobility status;Need for lift equipment (Lift pad in room for transfers with maximove) PT Visit Diagnosis: Muscle weakness (generalized) (M62.81);Unsteadiness on feet (R26.81);Other abnormalities of gait and mobility (R26.89);Difficulty in walking, not elsewhere classified (R26.2);Other symptoms and signs involving the nervous system (R29.898);Pain Pain - part of body:  (back, buttocks)     Time: 8766-8683 PT Time Calculation (min) (ACUTE ONLY): 43 min  Charges:    $Therapeutic Exercise: 8-22 mins $Therapeutic Activity: 23-37 mins PT General Charges $$ ACUTE PT VISIT: 1 Visit  Leontine Roads, PT, DPT [facility]  Rehabilitation Services Physical Therapist Office: [phone] Website: [email or web address]    Leontine GORMAN Roads 01/28/2024, 3:00 PM

## 2024-01-28 NOTE — Progress Notes (Signed)
 Pt is lethargy, sleepy, having generalized weakness, oriented x 2. Pt is more confused and agitated, likely delirium, able to follow commands. He is afebrile, stable hemodynamically, Afib on the monitor, HR is under controlled 70s-90s. Amiodarone  is continuing gtt at 60 mg/hr. CVP 12 mmHg. Pt is on room air, SPO2 94-97%, normal respiratory effort. No distress. Plan of care is reviewed. We will continue to monitor.  Wendi Dash, RN

## 2024-01-28 NOTE — Progress Notes (Signed)
 Carlos Hanson  FMW:[id number] DOB: June 08, 1940 DOA: 01/21/2024 PCP: Yolande Toribio [name], MD    Brief Narrative:  83 year old with a history of PAF on Xarelto , OSA, venous stasis insufficiency, chronic low back pain, CAD, GERD with HH, gout, PUD, HLD, OSA on CPAP, obesity, and chronic constipation who [facility] with 3 weeks of worsening leg swelling after having been seen by his PCP 2 days prior when he was newly prescribed Lasix .  While in the ER he developed RVR and IV Cardizem  was initiated.  This had to be switched to IV amiodarone  due to drop in blood pressures.  He was transferred to [facility] for further care.  Significant Events: 9/19 presented to [facility] -admitted 9/22 DCCV x 3 - unsuccessful 9/24 DCCV x 1 -unsuccessful  Goals of Care:   Code Status: Full Code   DVT prophylaxis: Place TED hose Start: 01/21/24 2316 rivaroxaban  (XARELTO ) tablet 20 mg   Interim Hx: No acute events reported overnight.  Afebrile.  Vital signs stable.  Heart rate controlled at 66-89.  Oxygen saturation 1% room air.  Resting comfortably in bed at the time of my visit.  Has no new complaints.  Denies chest pain or shortness of breath.  Assessment & Plan:  Paroxysmal atrial fibrillation with acute RVR TSH stable -TTE 9/22 noted EF 55% -status post multiple cardioversion attempts on 9/22 and 9/24 which were unsuccessful -cardiology following -consideration being given to eventual AV node ablation with pacemaker placement but volume status needs to be optimized prior to this -continue amiodarone , Xarelto , metoprolol   Acute Klebsiella pneumoniae cystitis Has completed a course of antibiotic therapy  Acute exacerbation of chronic diastolic congestive heart failure -lower extremity edema -chronic venous insufficiency Duplex negative for DVT -diuresis has been titrated during hospital stay based upon renal function  Acute on chronic wrist pain Plain film x-rays have revealed  degenerative joint disease -uric acid is normal  CKD stage IIIb Baseline creatinine 1.6-1.9  HLD Continue statin  Hypothyroidism Continue usual Synthroid  dose  OSA noncompliant with CPAP as outpatient Continue nighttime CPAP  Mild dementia/depression Continue usual duloxetine  Aricept  and Neurontin   Morbid obesity - Body mass index is 36.96 kg/m.     Family Communication:  Disposition:   Skilled Nursing-Short Term Rehab (<3 Hours/Day)01/27/2024 1400  Objective: Blood pressure 95/69, pulse 89, temperature (!) 97.5 F (36.4 C), temperature source Oral, resp. rate 17, height 5' 11 (1.803 m), weight 120.2 kg, SpO2 92%.  Intake/Output Summary (Last 24 hours) at 01/28/2024 0901 Last data filed at 01/28/2024 0500 Gross per 24 hour  Intake 714.48 ml  Output 1300 ml  Net -585.52 ml   Filed Weights   01/23/24 1418 01/28/24 0407  Weight: (!) 138.1 kg 120.2 kg    Examination: General: No acute respiratory distress Lungs: Clear to auscultation bilaterally with exception to some mild scattered crackles Cardiovascular: Regular rate without murmur Abdomen: Nontender, obese, nondistended, soft, bowel sounds positive, no rebound, no ascites, no appreciable mass Extremities: Trace bilateral lower extremity edema  CBC: Recent Labs  Lab 01/25/24 0301 01/26/24 0322 01/27/24 0627  WBC 12.3* 11.9* 8.3  HGB 11.7* 10.9* 11.9*  HCT 36.1* 33.8* 37.5*  MCV 94.3 94.4 94.5  PLT 329 307 304   Basic Metabolic Panel: Recent Labs  Lab 01/22/24 0337 01/23/24 0250 01/24/24 0334 01/25/24 0301 01/26/24 0322 01/27/24 0627  NA 140 140   < > 136 136 137  K 4.0 4.3   < > 3.8 3.6 3.6  CL 101 101   < > 97* 95* 95*  CO2 27 26   < > 28 31 31   GLUCOSE 98 100*   < > 111* 109* 88  BUN 32* 33*   < > 40* 35* 41*  CREATININE 1.48* 1.58*   < > 1.68* 1.45* 1.52*  CALCIUM  8.9 8.7*   < > 8.3* 8.1* 8.0*  MG 1.9 1.8  --   --   --   --    < > = values in this interval not displayed.    GFR: Estimated Creatinine Clearance: 47.7 mL/min (A) (by C-G formula based on SCr of 1.52 mg/dL (H)).   Scheduled Meds:  atorvastatin   20 mg Oral Daily   Chlorhexidine  Gluconate Cloth  6 each Topical Daily   donepezil   5 mg Oral QHS   doxycycline   100 mg Oral Q12H   DULoxetine   60 mg Oral BID   famotidine   20 mg Oral Daily   furosemide   80 mg Intravenous BID   gabapentin   600 mg Oral TID   lactulose   20 g Oral Daily   levothyroxine   50 mcg Oral QAC breakfast   melatonin  10 mg Oral QHS   metoprolol  tartrate  25 mg Oral Q6H   multivitamin  1 tablet Oral Daily   potassium chloride   40 mEq Oral Once   Rivaroxaban   20 mg Oral Q supper   senna-docusate  1 tablet Oral QHS   sodium chloride  flush  10-40 mL Intracatheter Q12H   Continuous Infusions:  amiodarone  60 mg/hr (01/28/24 0537)     LOS: 7 days   Reyes IVAR Moores, MD [facility] Office  [phone] Pager - Text Page per Tracey  If 7PM-7AM, please contact night-coverage per Amion 01/28/2024, 9:01 AM

## 2024-01-28 NOTE — Plan of Care (Signed)
 Pt is lethargy, oriented x 3, able to follow commands. He is afebrile, stable hemodynamically, Afib on the monitor, HR is under controlled 70s-90s. Amiodarone  is continuing gtt at 60 mg/hr. CVP 8 mmHg. Pt refuses CPAP at night. He is on room air SPO2 94-97%, normal respiratory effort. No acute distress. Pt needs assisted feeding. His appetite is gradually increased. He ate 100% of his dinner tray. Per RN reported, Pt had a good bowel movement yesterday. Plan of care is reviewed. We will continue to monitor.   Problem: Clinical Measurements: Goal: Ability to maintain clinical measurements within normal limits will improve Outcome: Progressing Goal: Will remain free from infection Outcome: Progressing Goal: Diagnostic test results will improve Outcome: Progressing Goal: Respiratory complications will improve Outcome: Progressing Goal: Cardiovascular complication will be avoided Outcome: Progressing   Problem: Activity: Goal: Risk for activity intolerance will decrease Outcome: Progressing   Problem: Elimination: Goal: Will not experience complications related to bowel motility Outcome: Progressing Goal: Will not experience complications related to urinary retention Outcome: Progressing   Problem: Pain Managment: Goal: General experience of comfort will improve and/or be controlled Outcome: Progressing   Problem: Skin Integrity: Goal: Risk for impaired skin integrity will decrease Outcome: Progressing   Problem: Safety: Goal: Ability to remain free from injury will improve Outcome: Progressing   Problem: Cardiac: Goal: Ability to achieve and maintain adequate cardiopulmonary perfusion will improve Outcome: Progressing   Wendi Dash, RN

## 2024-01-28 NOTE — NC FL2 (Signed)
 [place]  MEDICAID FL2 LEVEL OF CARE FORM     IDENTIFICATION  Patient Name: Carlos Hanson Birthdate: Apr 24, 1941 Sex: male Admission Date (Current Location): 01/21/2024  [facility] and [place] Number:  Producer, television/film/video and Address:  [facility] [place], [address] [place], [name] [address]      Provider Number: [id number]  Attending Physician Name and Address:  Danton Reyes DASEN, MD  Relative Name and Phone Number:  Carver Murakami (Spouse) [phone]    Current Level of Care: Hospital Recommended Level of Care: Skilled Nursing Facility Prior Approval Number:    Date Approved/Denied:   PASRR Number: [id number]  Discharge Plan: SNF    Current Diagnoses: Patient Active Problem List   Diagnosis Date Noted   Acute on chronic diastolic heart failure (HCC) 01/26/2024   Acute on chronic diastolic CHF (congestive heart failure) (HCC) 01/25/2024   CKD stage 3b, GFR 30-44 ml/min (HCC) 01/25/2024   Atrial fibrillation with rapid ventricular response (HCC) 01/21/2024   Bilateral lower extremity edema 01/21/2024   Chronic venous insufficiency of lower extremity 01/21/2024   Chronic low back pain 01/21/2024   Acute respiratory failure with hypoxia (HCC) 07/12/2023   Sepsis due to undetermined organism (HCC) 07/11/2023   Multifocal pneumonia 07/11/2023   Acute respiratory insufficiency 07/11/2023   Hypothyroidism 11/03/2018   Essential hypertension 07/14/2018   Dysphagia 02/22/2017   Major depression, single episode 08/10/2016   Fall 05/01/2016   Morbid obesity due to excess calories (HCC) 05/16/2015   Chronic pain syndrome 05/16/2015   OSA (obstructive sleep apnea) 05/16/2015   Cataract 01/23/2015   RLS (restless legs syndrome) 07/09/2014   Paroxysmal atrial fibrillation (HCC) 07/09/2014   Class 3 obesity with alveolar hypoventilation, serious comorbidity, and body mass index (BMI) of 40.0 to 44.9 in adult (HCC) 07/09/2014   OSA treated with BiPAP  07/09/2014   Urinary tract infection with hematuria 06/29/2014   Obstructive sleep apnea hypopnea, severe 06/29/2014   Hyperlipidemia 04/12/2009   Gastro-esophageal reflux disease without esophagitis 04/12/2009    Orientation RESPIRATION BLADDER Height & Weight     Self, Time, Situation, Place  Normal Incontinent, External catheter Weight: 264 lb 15.9 oz (120.2 kg) Height:  5' 11 (180.3 cm)  BEHAVIORAL SYMPTOMS/MOOD NEUROLOGICAL BOWEL NUTRITION STATUS      Incontinent Diet (please see discahrge summary)  AMBULATORY STATUS COMMUNICATION OF NEEDS Skin   Extensive Assist Verbally Other (Comment) (skin tear on right lower arm, right posterier knee pressure injury)                       Personal Care Assistance Level of Assistance  Bathing, Feeding, Dressing, Total care Bathing Assistance: Maximum assistance Feeding assistance: Limited assistance Dressing Assistance: Maximum assistance Total Care Assistance: Maximum assistance   Functional Limitations Info  Hearing, Sight Sight Info: Impaired Financial trader) Hearing Info: Impaired (Hearing Aid)      SPECIAL CARE FACTORS FREQUENCY  PT (By licensed PT), OT (By licensed OT)     PT Frequency: 5x a week OT Frequency: 5x a week            Contractures Contractures Info: Not present    Additional Factors Info  Code Status, Allergies, Psychotropic Code Status Info: FULL Allergies Info: Celecoxib, Penicillin G Sodium, Naprosyn (Naproxen) Psychotropic Info: donepezil  (ARICEPT ) tablet 5 mg,DULoxetine  (CYMBALTA ) DR capsule 60 mg         Current Medications (01/28/2024):  This is the current hospital active medication list Current Facility-Administered Medications  Medication Dose Route Frequency Provider Last Rate Last Admin   acetaminophen  (TYLENOL ) tablet 650 mg  650 mg Oral Q4H PRN Amponsah, Prosper M, MD   650 mg at 01/28/24 1227   alteplase  (CATHFLO ACTIVASE ) injection 2 mg  2 mg Intracatheter Once McClung, Jeffrey T,  MD       amiodarone  (NEXTERONE  PREMIX) 360-4.14 MG/200ML-% (1.8 mg/mL) IV infusion  60 mg/hr Intravenous Continuous Shlomo Corning R, MD 33.3 mL/hr at 01/28/24 1223 60 mg/hr at 01/28/24 1223   atorvastatin  (LIPITOR) tablet 20 mg  20 mg Oral Daily Amponsah, Prosper M, MD   20 mg at 01/28/24 0813   Chlorhexidine  Gluconate Cloth 2 % PADS 6 each  6 each Topical Daily Christobal Guadalajara, MD   6 each at 01/28/24 0920   diclofenac  Sodium (VOLTAREN ) 1 % topical gel 2 g  2 g Topical TID PRN Christobal Guadalajara, MD   2 g at 01/26/24 9073   donepezil  (ARICEPT ) tablet 5 mg  5 mg Oral QHS Amponsah, Prosper M, MD   5 mg at 01/27/24 2324   doxycycline  (VIBRA -TABS) tablet 100 mg  100 mg Oral Q12H Kc, Guadalajara, MD   100 mg at 01/28/24 0815   DULoxetine  (CYMBALTA ) DR capsule 60 mg  60 mg Oral BID Amponsah, Prosper M, MD   60 mg at 01/28/24 0815   famotidine  (PEPCID ) tablet 20 mg  20 mg Oral Daily Amponsah, Prosper M, MD   20 mg at 01/28/24 0813   furosemide  (LASIX ) injection 80 mg  80 mg Intravenous BID Hayes Lander L, NP   80 mg at 01/28/24 9187   gabapentin  (NEURONTIN ) capsule 600 mg  600 mg Oral TID Amponsah, Prosper M, MD   600 mg at 01/28/24 0813   HYDROcodone -acetaminophen  (NORCO/VICODIN) 5-325 MG per tablet 1 tablet  1 tablet Oral Q6H PRN Christobal Guadalajara, MD   1 tablet at 01/26/24 0924   lactulose  (CHRONULAC ) 10 GM/15ML solution 20 g  20 g Oral Daily Amponsah, Prosper M, MD   20 g at 01/27/24 0839   levothyroxine  (SYNTHROID ) tablet 50 mcg  50 mcg Oral QAC breakfast Amponsah, Prosper M, MD   50 mcg at 01/28/24 0816   melatonin tablet 10 mg  10 mg Oral QHS Amponsah, Prosper M, MD   10 mg at 01/27/24 2324   metoprolol  tartrate (LOPRESSOR ) tablet 25 mg  25 mg Oral Q6H Lesia Ozell Barter, PA-C   25 mg at 01/28/24 0512   multivitamin (PROSIGHT) tablet 1 tablet  1 tablet Oral Daily Lou Claretta HERO, MD   1 tablet at 01/28/24 9182   ondansetron  (ZOFRAN ) injection 4 mg  4 mg Intravenous Q6H PRN Amponsah, Prosper M, MD       polyethylene  glycol (MIRALAX  / GLYCOLAX ) packet 17 g  17 g Oral Daily PRN Kc, Ramesh, MD       rivaroxaban  (XARELTO ) tablet 20 mg  20 mg Oral Q supper Shlomo Corning R, MD   20 mg at 01/27/24 1622   senna-docusate (Senokot-S) tablet 1 tablet  1 tablet Oral QHS Lou Claretta HERO, MD   1 tablet at 01/27/24 2325   sodium chloride  flush (NS) 0.9 % injection 10-40 mL  10-40 mL Intracatheter Q12H Kc, Guadalajara, MD   10 mL at 01/28/24 9161   sodium chloride  flush (NS) 0.9 % injection 10-40 mL  10-40 mL Intracatheter PRN Christobal Guadalajara, MD         Discharge Medications: Please see discharge summary for a list of discharge medications.  Relevant Imaging Results:  Relevant Lab Results:   Additional Information SSN: [id number]  Whitfield Campanile, Student-Social Work

## 2024-01-28 NOTE — Progress Notes (Signed)
 Orthopedic Tech Progress Note Patient Details:  Carlos Hanson 1940/10/08 [id number]  Ortho Devices Type of Ortho Device: Radio broadcast assistant Ortho Device/Splint Location: BLE Ortho Device/Splint Interventions: Ordered, Application, Adjustment   Post Interventions Patient Tolerated: Well Instructions Provided: Care of device   [name] [name] 01/28/2024, 1:16 AM

## 2024-01-28 NOTE — Progress Notes (Signed)
  Not currently candidate for advanced EP procedures.   Consider DCC once euvolemic as long as other acute issues have improved; Otherwise as long as rates are controlled could as an outpatient at a later date.   Reviewed with Dr. Kennyth.   Ozell Jodie Passey, PA-C  01/28/2024 7:52 AM

## 2024-01-29 DIAGNOSIS — I5033 Acute on chronic diastolic (congestive) heart failure: Secondary | ICD-10-CM | POA: Diagnosis not present

## 2024-01-29 DIAGNOSIS — I4891 Unspecified atrial fibrillation: Secondary | ICD-10-CM | POA: Diagnosis not present

## 2024-01-29 LAB — COMPREHENSIVE METABOLIC PANEL WITH GFR
ALT: 55 U/L — ABNORMAL HIGH (ref 0–44)
AST: 60 U/L — ABNORMAL HIGH (ref 15–41)
Albumin: 1.6 g/dL — ABNORMAL LOW (ref 3.5–5.0)
Alkaline Phosphatase: 58 U/L (ref 38–126)
Anion gap: 15 (ref 5–15)
BUN: 57 mg/dL — ABNORMAL HIGH (ref 8–23)
CO2: 30 mmol/L (ref 22–32)
Calcium: 8.1 mg/dL — ABNORMAL LOW (ref 8.9–10.3)
Chloride: 88 mmol/L — ABNORMAL LOW (ref 98–111)
Creatinine, Ser: 2.26 mg/dL — ABNORMAL HIGH (ref 0.61–1.24)
GFR, Estimated: 28 mL/min — ABNORMAL LOW (ref >60)
Glucose, Bld: 181 mg/dL — ABNORMAL HIGH (ref 70–99)
Potassium: 3.1 mmol/L — ABNORMAL LOW (ref 3.5–5.1)
Sodium: 133 mmol/L — ABNORMAL LOW (ref 135–145)
Total Bilirubin: 0.5 mg/dL (ref 0.0–1.2)
Total Protein: 5.4 g/dL — ABNORMAL LOW (ref 6.5–8.1)

## 2024-01-29 LAB — CULTURE, BLOOD (ROUTINE X 2)
Culture: NO GROWTH
Culture: NO GROWTH
Special Requests: ADEQUATE

## 2024-01-29 LAB — CBC
HCT: 32.1 % — ABNORMAL LOW (ref 39.0–52.0)
Hemoglobin: 10.3 g/dL — ABNORMAL LOW (ref 13.0–17.0)
MCH: 29.9 pg (ref 26.0–34.0)
MCHC: 32.1 g/dL (ref 30.0–36.0)
MCV: 93 fL (ref 80.0–100.0)
Platelets: 390 K/uL (ref 150–400)
RBC: 3.45 MIL/uL — ABNORMAL LOW (ref 4.22–5.81)
RDW: 14.6 % (ref 11.5–15.5)
WBC: 7.9 K/uL (ref 4.0–10.5)
nRBC: 0 % (ref 0.0–0.2)

## 2024-01-29 LAB — MAGNESIUM: Magnesium: 2.2 mg/dL (ref 1.7–2.4)

## 2024-01-29 MED ORDER — POTASSIUM CHLORIDE CRYS ER 20 MEQ PO TBCR: 40.0000 meq | EXTENDED_RELEASE_TABLET | Freq: Three times a day (TID) | ORAL | Status: DC

## 2024-01-29 MED ORDER — FUROSEMIDE 10 MG/ML IJ SOLN
40.0000 mg | Freq: Two times a day (BID) | INTRAMUSCULAR | Status: DC
  Administered 2024-01-29 – 2024-01-31 (×3): 40 mg via INTRAVENOUS
  Filled 2024-01-29 (×4): qty 4

## 2024-01-29 MED ORDER — GUAIFENESIN-DM 100-10 MG/5ML PO SYRP
15.0000 mL | ORAL_SOLUTION | ORAL | Status: DC | PRN
  Administered 2024-01-29: 15 mL via ORAL
  Filled 2024-01-29: qty 15

## 2024-01-29 MED ORDER — POTASSIUM CHLORIDE CRYS ER 20 MEQ PO TBCR
40.0000 meq | EXTENDED_RELEASE_TABLET | Freq: Three times a day (TID) | ORAL | Status: AC
  Administered 2024-01-29 (×3): 40 meq via ORAL
  Filled 2024-01-29 (×3): qty 2

## 2024-01-29 NOTE — Progress Notes (Signed)
 Patient's right hearing aid not found at bedside, informed to his son.

## 2024-01-29 NOTE — Progress Notes (Signed)
 Carlos Hanson  FMW:[id number] DOB: 04-06-1941 DOA: 01/21/2024 PCP: Yolande Toribio [name], MD    Brief Narrative:  83 year old with a history of PAF on Xarelto , OSA noncompliant with CPAP, venous stasis insufficiency, chronic low back pain, CAD, GERD with HH, gout, PUD, HLD, obesity, and chronic constipation who presented to [facility] with 3 weeks of worsening leg swelling after having been seen by his PCP 2 days prior when he was newly prescribed Lasix .  While in the ER he developed RVR and IV Cardizem  was initiated.  This had to be switched to IV amiodarone  due to a drop in his blood pressures.  He was transferred to [facility] for further care.  Significant Events: 9/19 [facility] - admitted 9/22 DCCV x 3 - unsuccessful 9/24 DCCV x 1 -unsuccessful  Goals of Care:   Code Status: Full Code   DVT prophylaxis: Place TED hose Start: 01/21/24 2316 rivaroxaban  (XARELTO ) tablet 20 mg   Interim Hx: The patient refused his CPAP last night.  No other acute events were recorded.  Vital signs are stable with heart rate controlled.  Afebrile.  Potassium suboptimal at 3.1.  Creatinine continues to rise.  Magnesium  is normal. Resting comfortably in bed. Denies any new complaints today. Appears comfortable.   Assessment & Plan:  Paroxysmal atrial fibrillation with acute RVR TSH stable - TTE 9/22 noted EF 55% - status post multiple cardioversion attempts on 9/22 and 9/24 which were unsuccessful - Cardiology directing care of this issue - consideration being given to eventual AV node ablation with pacemaker placement if a final attempt at cardioversion is not successful - volume status needs to be optimized prior to this - continue amiodarone , Xarelto , metoprolol  - maximize potassium  Acute Klebsiella pneumoniae cystitis 01/21/24 - resolved Has completed a course of antibiotic therapy  Acute exacerbation of chronic diastolic congestive heart failure -lower extremity edema  -chronic venous insufficiency Duplex negative for DVT - EF 55% via TTE this admit - diuresis has been titrated during hospital stay based upon renal function - creatinine climbing at present   Acute on chronic wrist pain - resolved  Plain film x-rays have revealed degenerative joint disease -uric acid is normal - pain resolved for now   CKD stage IIIb Baseline creatinine 1.6-1.9 -creatinine fluctuating with use of diuretic -continue to follow in serial fashion  HLD Continue statin  Hypothyroidism Continue usual Synthroid  dose  OSA noncompliant with CPAP as outpatient Continue attempts at nighttime CPAP -connection between decompensated heart failure as well as arrhythmia and CPAP noncompliance explained to patient  Mild dementia/depression Continue usual duloxetine  Aricept  and Neurontin   Morbid obesity - Body mass index is 40.2 kg/m.   Family Communication: no family present at time of visit today  Disposition:   Skilled Nursing-Short Term Rehab (<3 Hours/Day)01/28/2024 1400  Objective: Blood pressure 118/79, pulse 76, temperature (!) 97.5 F (36.4 C), temperature source Oral, resp. rate 15, height 5' 11 (1.803 m), weight 130.7 kg, SpO2 91%.  Intake/Output Summary (Last 24 hours) at 01/29/2024 0805 Last data filed at 01/29/2024 0422 Gross per 24 hour  Intake 933.29 ml  Output 2600 ml  Net -1666.71 ml   Filed Weights   01/28/24 0407 01/29/24 0421 01/29/24 0500  Weight: 120.2 kg 130.7 kg 130.7 kg    Examination: General: No acute respiratory distress Lungs: Clear to auscultation bilaterally - distant BS th/o due to body habitus  Cardiovascular: Regular rate without murmur Abdomen: Nontender, obese, nondistended, soft, bowel sounds  positive, no rebound, no ascites, no appreciable mass Extremities: Trace bilateral lower extremity edema w/o change   CBC: Recent Labs  Lab 01/26/24 0322 01/27/24 0627 01/29/24 0425  WBC 11.9* 8.3 7.9  HGB 10.9* 11.9* 10.3*  HCT 33.8*  37.5* 32.1*  MCV 94.4 94.5 93.0  PLT 307 304 390   Basic Metabolic Panel: Recent Labs  Lab 01/23/24 0250 01/24/24 0334 01/27/24 0627 01/28/24 1104 01/29/24 0425  NA 140   < > 137 134* 133*  K 4.3   < > 3.6 3.2* 3.1*  CL 101   < > 95* 92* 88*  CO2 26   < > 31 29 30   GLUCOSE 100*   < > 88 102* 181*  BUN 33*   < > 41* 49* 57*  CREATININE 1.58*   < > 1.52* 1.92* 2.26*  CALCIUM  8.7*   < > 8.0* 8.4* 8.1*  MG 1.8  --   --   --  2.2   < > = values in this interval not displayed.   GFR: Estimated Creatinine Clearance: 33.6 mL/min (A) (by C-G formula based on SCr of 2.26 mg/dL (H)).   Scheduled Meds:  atorvastatin   20 mg Oral Daily   Chlorhexidine  Gluconate Cloth  6 each Topical Daily   donepezil   5 mg Oral QHS   doxycycline   100 mg Oral Q12H   DULoxetine   60 mg Oral BID   famotidine   20 mg Oral Daily   furosemide   80 mg Intravenous BID   gabapentin   600 mg Oral TID   lactulose   20 g Oral Daily   levothyroxine   50 mcg Oral QAC breakfast   melatonin  10 mg Oral QHS   metoprolol  tartrate  25 mg Oral Q6H   multivitamin  1 tablet Oral Daily   Rivaroxaban   20 mg Oral Q supper   senna-docusate  1 tablet Oral QHS   sodium chloride  flush  10-40 mL Intracatheter Q12H   Continuous Infusions:  amiodarone  60 mg/hr (01/29/24 0732)     LOS: 8 days   Reyes IVAR Moores, MD [facility] Office  [phone] Pager - Text Page per Tracey  If 7PM-7AM, please contact night-coverage per Amion 01/29/2024, 8:05 AM

## 2024-01-29 NOTE — TOC Progression Note (Addendum)
 Transition of Care [facility]) - Progression Note    Patient Details  Name: Carlos Hanson MRN: [id number] Date of Birth: Apr 27, 1941  Transition of [facility] CM/SW Contact  Isaiah Public, LCSWA Phone Number: 01/29/2024, 12:53 PM  Clinical Narrative:     CSW spoke with patient and patients spouse. [facility] and patients spouse are hopeful [facility] re[facility] bed for patient. Patient is from [facility]. CSW called and spoke with front desk who informed CSW that admissions will be back in on Monday. CSW was transferred to Kristin in admissions voicemail. CSW  LVM for admissions. CSW spoke with idowu with [employer] and requested to start insurance authorization for SNF and PTAR for patient. Once facility is confirmed CSW will call [employer] and add patients facility choice to patients insurance authorization request. TOC will continue to follow.  Expected Discharge Plan: Skilled Nursing Facility Barriers to Discharge: Continued Medical Work up               Expected Discharge Plan and Services   Discharge Planning Services: CM Consult Post Acute Care Choice: Home Health Living arrangements for the past 2 months: Apartment, Independent Living Facility                                       Social Drivers of Health (SDOH) Interventions SDOH Screenings   Food Insecurity: No Food Insecurity (01/21/2024)  Housing: Low Risk  (01/21/2024)  Transportation Needs: No Transportation Needs (01/21/2024)  Utilities: Not At Risk (01/21/2024)  Social Connections: Moderately Isolated (01/21/2024)  Tobacco Use: Medium Risk (01/26/2024)    Readmission Risk Interventions     No data to display

## 2024-01-29 NOTE — Plan of Care (Signed)
   Problem: Nutrition: Goal: Adequate nutrition will be maintained Outcome: Progressing   Problem: Pain Managment: Goal: General experience of comfort will improve and/or be controlled Outcome: Progressing   Problem: Safety: Goal: Ability to remain free from injury will improve Outcome: Progressing   Problem: Skin Integrity: Goal: Risk for impaired skin integrity will decrease Outcome: Progressing   Problem: Cardiac: Goal: Ability to achieve and maintain adequate cardiopulmonary perfusion will improve Outcome: Progressing

## 2024-01-29 NOTE — Progress Notes (Signed)
 Rounding Note   Patient Name: Carlos Hanson Date of Encounter: 01/29/2024  [facility] Cardiologist: Gordy Bergamo, MD   Subjective  No CP or dyspnea  Scheduled Meds:  atorvastatin   20 mg Oral Daily   Chlorhexidine  Gluconate Cloth  6 each Topical Daily   donepezil   5 mg Oral QHS   doxycycline   100 mg Oral Q12H   DULoxetine   60 mg Oral BID   famotidine   20 mg Oral Daily   furosemide   80 mg Intravenous BID   gabapentin   600 mg Oral TID   lactulose   20 g Oral Daily   levothyroxine   50 mcg Oral QAC breakfast   melatonin  10 mg Oral QHS   metoprolol  tartrate  25 mg Oral Q6H   multivitamin  1 tablet Oral Daily   potassium chloride   40 mEq Oral TID   Rivaroxaban   20 mg Oral Q supper   senna-docusate  1 tablet Oral QHS   sodium chloride  flush  10-40 mL Intracatheter Q12H   Continuous Infusions:  amiodarone  60 mg/hr (01/29/24 0732)   PRN Meds: acetaminophen , diclofenac  Sodium, HYDROcodone -acetaminophen , ondansetron  (ZOFRAN ) IV, polyethylene glycol, sodium chloride  flush   Vital Signs  Vitals:   01/29/24 0400 01/29/24 0421 01/29/24 0500 01/29/24 0736  BP: 114/69   118/79  Pulse:  72  76  Resp: 20 20  15   Temp:    (!) 97.5 F (36.4 C)  TempSrc:    Oral  SpO2:  96%  91%  Weight:  130.7 kg 130.7 kg   Height:        Intake/Output Summary (Last 24 hours) at 01/29/2024 0900 Last data filed at 01/29/2024 0422 Gross per 24 hour  Intake 933.29 ml  Output 2600 ml  Net -1666.71 ml      01/29/2024    5:00 AM 01/29/2024    4:21 AM 01/28/2024    4:07 AM  Last 3 Weights  Weight (lbs) 288 lb 3.2 oz 288 lb 3.2 oz 264 lb 15.9 oz  Weight (kg) 130.727 kg 130.727 kg 120.2 kg      Telemetry Atrial fibrillation rate controlled - Personally Reviewed   Physical Exam  GEN: No acute distress.   Neck: supple Cardiac: irregular Respiratory: Clear to auscultation bilaterally. GI: Soft, nontender, non-distended  MS: 1+ edema Neuro:  Nonfocal  Psych: Normal affect    Labs  Chemistry Recent Labs  Lab 01/23/24 0250 01/24/24 0334 01/27/24 0627 01/28/24 1104 01/29/24 0425  NA 140   < > 137 134* 133*  K 4.3   < > 3.6 3.2* 3.1*  CL 101   < > 95* 92* 88*  CO2 26   < > 31 29 30   GLUCOSE 100*   < > 88 102* 181*  BUN 33*   < > 41* 49* 57*  CREATININE 1.58*   < > 1.52* 1.92* 2.26*  CALCIUM  8.7*   < > 8.0* 8.4* 8.1*  MG 1.8  --   --   --  2.2  PROT  --   --   --   --  5.4*  ALBUMIN  --   --   --   --  1.6*  AST  --   --   --   --  60*  ALT  --   --   --   --  55*  ALKPHOS  --   --   --   --  58  BILITOT  --   --   --   --  0.5  GFRNONAA 43*   < > 45* 34* 28*  ANIONGAP 13   < > 11 13 15    < > = values in this interval not displayed.     Hematology Recent Labs  Lab 01/26/24 0322 01/27/24 0627 01/29/24 0425  WBC 11.9* 8.3 7.9  RBC 3.58* 3.97* 3.45*  HGB 10.9* 11.9* 10.3*  HCT 33.8* 37.5* 32.1*  MCV 94.4 94.5 93.0  MCH 30.4 30.0 29.9  MCHC 32.2 31.7 32.1  RDW 15.3 15.0 14.6  PLT 307 304 390     Patient Profile   83 y.o. male with past medical history of heart failure preserved ejection fraction, paroxysmal atrial fibrillation, coronary artery disease, hyperlipidemia, obstructive sleep apnea being evaluated for acute on chronic CHF as well as atrial fibrillation.  Echocardiogram September 2025 showed ejection fraction 55%, mild left ventricular hypertrophy, mild RV dysfunction, mild right atrial enlargement, small pericardial effusion.  Patient had attempts at cardioversion on September 22 and September 24.  Few beats of sinus established on September 22 but atrial fibrillation recurred.  Assessment & Plan  1 acute on chronic heart failure preserved ejection fraction-volume status appears to be improving.  I/O - [id number]. CVP has decreased to 4-8 and renal function is worse.  Will decrease Lasix  to 40 mg twice daily and follow.  2 persistent atrial fibrillation-patient had a few beats of sinus rhythm with attempt at cardioversion on September  20 2nd and 2nd attempt on September 24 unsuccessful.  Now on amiodarone .  Plan is to ultimately proceed with repeat cardioversion once CHF improves and amiodarone  is loaded.  Continue Xarelto .  3 acute on chronic stage III kidney disease-renal function worse today with creatinine 2.26 and BUN 57.  I will decrease Lasix  as outlined above and follow.  4 obstructive sleep apnea-patient is noncompliant with CPAP.  5 morbid obesity-patient needs weight loss.  For questions or updates, please contact [facility] Please consult [email or web address] for contact info under      Signed, Redell Shallow, MD  01/29/2024, 9:00 AM

## 2024-01-30 DIAGNOSIS — I4891 Unspecified atrial fibrillation: Secondary | ICD-10-CM | POA: Diagnosis not present

## 2024-01-30 LAB — BASIC METABOLIC PANEL WITH GFR
Anion gap: 12 (ref 5–15)
BUN: 61 mg/dL — ABNORMAL HIGH (ref 8–23)
CO2: 31 mmol/L (ref 22–32)
Calcium: 8.3 mg/dL — ABNORMAL LOW (ref 8.9–10.3)
Chloride: 92 mmol/L — ABNORMAL LOW (ref 98–111)
Creatinine, Ser: 2.36 mg/dL — ABNORMAL HIGH (ref 0.61–1.24)
GFR, Estimated: 26 mL/min — ABNORMAL LOW (ref >60)
Glucose, Bld: 141 mg/dL — ABNORMAL HIGH (ref 70–99)
Potassium: 3.9 mmol/L (ref 3.5–5.1)
Sodium: 135 mmol/L (ref 135–145)

## 2024-01-30 MED ORDER — GABAPENTIN 300 MG PO CAPS
300.0000 mg | ORAL_CAPSULE | Freq: Three times a day (TID) | ORAL | Status: DC
  Administered 2024-01-30 – 2024-02-03 (×11): 300 mg via ORAL
  Filled 2024-01-30 (×11): qty 1

## 2024-01-30 MED ORDER — METOPROLOL TARTRATE 25 MG PO TABS
25.0000 mg | ORAL_TABLET | Freq: Two times a day (BID) | ORAL | Status: DC
  Administered 2024-01-30 – 2024-01-31 (×3): 25 mg via ORAL
  Filled 2024-01-30 (×3): qty 1

## 2024-01-30 NOTE — Progress Notes (Signed)
 Rounding Note   Patient Name: Carlos Hanson Date of Encounter: 01/30/2024  [facility] Cardiologist: Gordy Bergamo, MD   Subjective  Pt denies CP or dyspnea  Scheduled Meds:  atorvastatin   20 mg Oral Daily   Chlorhexidine  Gluconate Cloth  6 each Topical Daily   donepezil   5 mg Oral QHS   DULoxetine   60 mg Oral BID   famotidine   20 mg Oral Daily   furosemide   40 mg Intravenous BID   gabapentin   600 mg Oral TID   lactulose   20 g Oral Daily   levothyroxine   50 mcg Oral QAC breakfast   melatonin  10 mg Oral QHS   metoprolol  tartrate  25 mg Oral Q6H   multivitamin  1 tablet Oral Daily   Rivaroxaban   20 mg Oral Q supper   senna-docusate  1 tablet Oral QHS   sodium chloride  flush  10-40 mL Intracatheter Q12H   Continuous Infusions:  amiodarone  60 mg/hr (01/30/24 0136)   PRN Meds: acetaminophen , diclofenac  Sodium, guaiFENesin-dextromethorphan, HYDROcodone -acetaminophen , ondansetron  (ZOFRAN ) IV, polyethylene glycol, sodium chloride  flush   Vital Signs  Vitals:   01/29/24 2242 01/30/24 0000 01/30/24 0249 01/30/24 0458  BP: (!) 101/52 (!) 108/52 (!) 102/53 (!) 115/50  Pulse: 78 78 (!) 54 68  Resp: 18 16 (!) 22   Temp: (!) 97.3 F (36.3 C)  (!) 97.4 F (36.3 C)   TempSrc: Oral  Oral   SpO2: 99% 97% 96%   Weight:      Height:        Intake/Output Summary (Last 24 hours) at 01/30/2024 0631 Last data filed at 01/30/2024 0100 Gross per 24 hour  Intake 720 ml  Output 2600 ml  Net -1880 ml      01/29/2024    5:00 AM 01/29/2024    4:21 AM 01/28/2024    4:07 AM  Last 3 Weights  Weight (lbs) 288 lb 3.2 oz 288 lb 3.2 oz 264 lb 15.9 oz  Weight (kg) 130.727 kg 130.727 kg 120.2 kg      Telemetry Atrial fibrillation rate controlled - Personally Reviewed   Physical Exam  GEN: NAD, obese Neck: supple, no adenopathy Cardiac: irregular Respiratory: CTA GI: Soft, NT/ND MS: 1+ thigh edema Neuro:  grossly intact  Psych: Normal affect   Labs  Chemistry Recent Labs   Lab 01/28/24 1104 01/29/24 0425 01/30/24 0238  NA 134* 133* 135  K 3.2* 3.1* 3.9  CL 92* 88* 92*  CO2 29 30 31   GLUCOSE 102* 181* 141*  BUN 49* 57* 61*  CREATININE 1.92* 2.26* 2.36*  CALCIUM  8.4* 8.1* 8.3*  MG  --  2.2  --   PROT  --  5.4*  --   ALBUMIN  --  1.6*  --   AST  --  60*  --   ALT  --  55*  --   ALKPHOS  --  58  --   BILITOT  --  0.5  --   GFRNONAA 34* 28* 26*  ANIONGAP 13 15 12      Hematology Recent Labs  Lab 01/26/24 0322 01/27/24 0627 01/29/24 0425  WBC 11.9* 8.3 7.9  RBC 3.58* 3.97* 3.45*  HGB 10.9* 11.9* 10.3*  HCT 33.8* 37.5* 32.1*  MCV 94.4 94.5 93.0  MCH 30.4 30.0 29.9  MCHC 32.2 31.7 32.1  RDW 15.3 15.0 14.6  PLT 307 304 390     Patient Profile   83 y.o. male with past medical history of heart failure preserved  ejection fraction, paroxysmal atrial fibrillation, coronary artery disease, hyperlipidemia, obstructive sleep apnea being evaluated for acute on chronic CHF as well as atrial fibrillation.  Echocardiogram September 2025 showed ejection fraction 55%, mild left ventricular hypertrophy, mild RV dysfunction, mild right atrial enlargement, small pericardial effusion.  Patient had attempts at cardioversion on September 22 and September 24.  Few beats of sinus established on September 22 but atrial fibrillation recurred.  Assessment & Plan  1 acute on chronic heart failure preserved ejection fraction-patient remains mildly volume overloaded on examination. I/O -[id number] since admission.  CVP documented at 20 this morning.  BUN/creatinine 61 and 2.36.  Will continue Lasix  at present dose and follow closely.  Hopefully if sinus reestablished CHF will improve.  2 persistent atrial fibrillation-patient had a few beats of sinus rhythm with attempt at cardioversion on September 20 and 2nd attempt on September 24 unsuccessful.  Now on amiodarone .  Plan is to ultimately proceed with repeat cardioversion once CHF improves and amiodarone  is loaded.  Continue  Xarelto .  Will decrease metoprolol  to 25 mg twice daily.  3 acute on chronic stage III kidney disease-renal function slightly worse today.  Will continue to follow closely.  May need to reduce diuretics in a.m. if renal function continues to decline.  4 obstructive sleep apnea-patient is noncompliant with CPAP.  5 morbid obesity-patient needs weight loss.  For questions or updates, please contact [facility] Please consult [email or web address] for contact info under      Signed, Redell Shallow, MD  01/30/2024, 6:31 AM

## 2024-01-30 NOTE — Plan of Care (Signed)

## 2024-01-30 NOTE — Progress Notes (Signed)
 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  While in the ER he developed RVR and IV Cardizem  was initiated.  This had to be switched to IV amiodarone  due to a drop in his blood pressures.  He was transferred to [facility] for further care.  Significant Events: 9/19 presented to [facility] - admitted 9/22 DCCV x 3 - unsuccessful 9/24 DCCV x 1 -unsuccessful  Goals of Care:   Code Status: Full Code   DVT prophylaxis: Place TED hose Start: 01/21/24 2316 rivaroxaban  (XARELTO ) tablet 20 mg   Interim Hx: No acute events recorded overnight.  Afebrile.  Vital signs stable.  Systolic blood pressures modestly low at 98-115.  Creatinine increased slightly overnight from 2.26 > 2.36.  Resting comfortably in bed with no new complaints.  In good spirits.  Denies shortness of breath or chest pain.  Assessment & Plan:  Paroxysmal atrial fibrillation with acute RVR TSH stable - TTE 9/22 noted EF 55% - status post multiple cardioversion attempts on 9/22 and 9/24 which were unsuccessful - Cardiology directing care of this issue - consideration being given to eventual AV node ablation with pacemaker placement if a final attempt at cardioversion is not successful - volume status needs to be optimized prior to a final DCCV attempt - continue amiodarone , Xarelto , metoprolol  - maximize potassium  Acute exacerbation of chronic diastolic congestive heart failure -lower extremity edema -chronic venous insufficiency Duplex negative for DVT - EF 55% via TTE this admit - diuresis is being  titrated during hospital stay based upon renal function - creatinine still climbing at present   CKD stage IIIb Baseline creatinine 1.6-1.9 -creatinine fluctuating with use of diuretic -continue to follow in serial fashion  Recent Labs  Lab 01/26/24 0322 01/27/24 0627 01/28/24 1104 01/29/24 0425 01/30/24 0238  CREATININE 1.45* 1.52* 1.92* 2.26* 2.36*     Acute Klebsiella pneumoniae cystitis 01/21/24 - resolved Has completed a course of antibiotic therapy  Acute on chronic wrist pain - resolved  Plain film x-rays have revealed degenerative joint disease - uric acid is normal - pain resolved for now   HLD Continue statin  Hypothyroidism Continue usual Synthroid  dose  OSA noncompliant with CPAP as outpatient Continue attempts at nighttime CPAP - connection between decompensated heart failure as well as arrhythmia and CPAP noncompliance explained to patient  Mild dementia/depression Continue usual duloxetine  Aricept  and Neurontin   Morbid obesity - Body mass index is 40.2 kg/m.   Family Communication: Spoke with wife at bedside at length Disposition:   Skilled Nursing-Short Term Rehab (<3 Hours/Day)01/28/2024 1400  Objective: Blood pressure 105/79, pulse 63, temperature 97.8 F (36.6 C), temperature source Oral, resp. rate 15, height 5' 11 (1.803 m), weight 130.7 kg, SpO2 97%.  Intake/Output Summary (Last 24 hours) at 01/30/2024 0805 Last data filed at 01/30/2024 0100 Gross per 24 hour  Intake 720 ml  Output 2600 ml  Net -1880 ml   Filed Weights   01/28/24 0407 01/29/24 0421 01/29/24 0500  Weight: 120.2 kg 130.7 kg 130.7 kg    Examination: General: No acute respiratory  distress Lungs: Clear to auscultation bilaterally - distant BS th/o due to body habitus  Cardiovascular: Regular rate without murmur Abdomen: Nontender, obese, nondistended, soft, bowel sounds positive, no rebound, no ascites, no appreciable mass Extremities: 1+ bilateral lower extremity  edema  CBC: Recent Labs  Lab 01/26/24 0322 01/27/24 0627 01/29/24 0425  WBC 11.9* 8.3 7.9  HGB 10.9* 11.9* 10.3*  HCT 33.8* 37.5* 32.1*  MCV 94.4 94.5 93.0  PLT 307 304 390   Basic Metabolic Panel: Recent Labs  Lab 01/28/24 1104 01/29/24 0425 01/30/24 0238  NA 134* 133* 135  K 3.2* 3.1* 3.9  CL 92* 88* 92*  CO2 29 30 31   GLUCOSE 102* 181* 141*  BUN 49* 57* 61*  CREATININE 1.92* 2.26* 2.36*  CALCIUM  8.4* 8.1* 8.3*  MG  --  2.2  --    GFR: Estimated Creatinine Clearance: 32.1 mL/min (A) (by C-G formula based on SCr of 2.36 mg/dL (H)).   Scheduled Meds:  atorvastatin   20 mg Oral Daily   Chlorhexidine  Gluconate Cloth  6 each Topical Daily   donepezil   5 mg Oral QHS   DULoxetine   60 mg Oral BID   famotidine   20 mg Oral Daily   furosemide   40 mg Intravenous BID   gabapentin   600 mg Oral TID   lactulose   20 g Oral Daily   levothyroxine   50 mcg Oral QAC breakfast   melatonin  10 mg Oral QHS   metoprolol  tartrate  25 mg Oral BID   multivitamin  1 tablet Oral Daily   Rivaroxaban   20 mg Oral Q supper   senna-docusate  1 tablet Oral QHS   sodium chloride  flush  10-40 mL Intracatheter Q12H   Continuous Infusions:  amiodarone  60 mg/hr (01/30/24 0722)     LOS: 9 days   Reyes IVAR Moores, MD [facility] Office  [phone] Pager - Text Page per Tracey  If 7PM-7AM, please contact night-coverage per Amion 01/30/2024, 8:05 AM

## 2024-01-30 NOTE — Progress Notes (Signed)
 Patient refused CPAP for the night. Home CPAP on nightstand.

## 2024-01-31 DIAGNOSIS — I5033 Acute on chronic diastolic (congestive) heart failure: Secondary | ICD-10-CM | POA: Diagnosis not present

## 2024-01-31 DIAGNOSIS — I4891 Unspecified atrial fibrillation: Secondary | ICD-10-CM | POA: Diagnosis not present

## 2024-01-31 LAB — BASIC METABOLIC PANEL WITH GFR
Anion gap: 12 (ref 5–15)
BUN: 58 mg/dL — ABNORMAL HIGH (ref 8–23)
CO2: 31 mmol/L (ref 22–32)
Calcium: 8.4 mg/dL — ABNORMAL LOW (ref 8.9–10.3)
Chloride: 94 mmol/L — ABNORMAL LOW (ref 98–111)
Creatinine, Ser: 2.32 mg/dL — ABNORMAL HIGH (ref 0.61–1.24)
GFR, Estimated: 27 mL/min — ABNORMAL LOW (ref >60)
Glucose, Bld: 107 mg/dL — ABNORMAL HIGH (ref 70–99)
Potassium: 3.6 mmol/L (ref 3.5–5.1)
Sodium: 137 mmol/L (ref 135–145)

## 2024-01-31 LAB — COOXEMETRY PANEL
Carboxyhemoglobin: 1 % (ref 0.5–1.5)
Methemoglobin: <0.7 % (ref 0.0–1.5)
O2 Saturation: 52.4 %
Total hemoglobin: 11.4 g/dL — ABNORMAL LOW (ref 12.0–16.0)

## 2024-01-31 MED ORDER — POTASSIUM CHLORIDE CRYS ER 20 MEQ PO TBCR
40.0000 meq | EXTENDED_RELEASE_TABLET | Freq: Once | ORAL | Status: AC
Start: 2024-01-31 — End: 2024-01-31
  Administered 2024-01-31: 40 meq via ORAL
  Filled 2024-01-31: qty 2

## 2024-01-31 NOTE — TOC Progression Note (Signed)
 Transition of Care [facility]) - Progression Note    Patient Details  Name: Carlos Hanson MRN: [id number] Date of Birth: [birth year]  [facility] of [facility]) CM/SW Contact  [name], [name] Phone Number: 01/31/2024, 11:32 AM  Clinical Narrative:     TOC continuing to follow  Patient will d/c to SNF once stable   Montie Louder, MSW, LCSW Clinical Social Worker    Expected Discharge Plan: Skilled Nursing Facility Barriers to Discharge: Continued Medical Work up               Expected Discharge Plan and Services   Discharge Planning Services: CM Consult Post Acute Care Choice: Home Health Living arrangements for the past 2 months: Apartment, Independent Living Facility                                       Social Drivers of Health (SDOH) Interventions SDOH Screenings   Food Insecurity: No Food Insecurity (01/21/2024)  Housing: Low Risk  (01/21/2024)  Transportation Needs: No Transportation Needs (01/21/2024)  Utilities: Not At Risk (01/21/2024)  Social Connections: Moderately Isolated (01/21/2024)  Tobacco Use: Medium Risk (01/26/2024)    Readmission Risk Interventions     No data to display

## 2024-01-31 NOTE — TOC Progression Note (Signed)
 Transition of Care [facility]) - Progression Note    Patient Details  Name: ORLAN AVERSA MRN: [id number] Date of Birth: [birth year]  Transition of Care [facility]) CM/SW Contact  [name], [name] Phone Number: 01/31/2024, 3:49 PM  Clinical Narrative:     Received insurance approval # [id number] PTAR [id number]  Called insurance left voice message of SNF choice/[facility]- requested call back.  Montie Louder, MSW, LCSW Clinical Social Worker    Expected Discharge Plan: Skilled Nursing Facility Barriers to Discharge: Continued Medical Work up               Expected Discharge Plan and Services   Discharge Planning Services: CM Consult Post Acute Care Choice: Home Health Living arrangements for the past 2 months: Apartment, Independent Living Facility                                       Social Drivers of Health (SDOH) Interventions SDOH Screenings   Food Insecurity: No Food Insecurity (01/21/2024)  Housing: Low Risk  (01/21/2024)  Transportation Needs: No Transportation Needs (01/21/2024)  Utilities: Not At Risk (01/21/2024)  Social Connections: Moderately Isolated (01/21/2024)  Tobacco Use: Medium Risk (01/26/2024)    Readmission Risk Interventions     No data to display

## 2024-01-31 NOTE — Progress Notes (Addendum)
 Advanced Heart Failure Rounding Note  Cardiologist: Gordy Bergamo, MD  Chief Complaint: A/C HFpEF Subjective:    Diuretics reduced over the weekend, after developing AKI. Scr 1.45>>1.52>>1.92>>2.3.  Co-ox checked yesterday and was marginal at 55%. Repeat pending.   CVPs widely variable. Rounding note from yesterday reported CVP to be ~20. Today w/ variable measurements from 4-13.   Assessment of volume status by physical exam also difficult given obesity/body habitus.   Remains in Afib, rate controlled 70s.   Sleeping/ resting comfortably. Denies any current resting dyspnea.    Objective:   Weight Range: 130.7 kg Body mass index is 40.2 kg/m.   Vital Signs:   Temp:  [96.5 F (35.8 C)-98.5 F (36.9 C)] 98.4 F (36.9 C) (09/29 0800) Pulse Rate:  [58-106] 106 (09/29 0800) Resp:  [17-19] 18 (09/29 0800) BP: (92-119)/(55-75) 119/75 (09/29 0800) SpO2:  [90 %-96 %] 94 % (09/29 0800) Last BM Date : 01/29/24  Weight change: Filed Weights   01/28/24 0407 01/29/24 0421 01/29/24 0500  Weight: 120.2 kg 130.7 kg 130.7 kg    Intake/Output:   Intake/Output Summary (Last 24 hours) at 01/31/2024 0911 Last data filed at 01/31/2024 0800 Gross per 24 hour  Intake 1713.01 ml  Output 900 ml  Net 813.01 ml    Physical Exam   GENERAL: obese, NAD Lungs- diminished at the bases  CARDIAC:  thick neck JVD not well visualized           Irregularly irregular rhythm and rate. Trace b/l LEE, + unna boots  ABDOMEN: obese, soft, non-tender, non-distended.  EXTREMITIES: Warm and well perfused.  NEUROLOGIC: No obvious FND  Telemetry   A fib 70s (Personally reviewed)    Labs    CBC Recent Labs    01/29/24 0425  WBC 7.9  HGB 10.3*  HCT 32.1*  MCV 93.0  PLT 390   Basic Metabolic Panel Recent Labs    90/72/74 0425 01/30/24 0238 01/31/24 0550  NA 133* 135 137  K 3.1* 3.9 3.6  CL 88* 92* 94*  CO2 30 31 31   GLUCOSE 181* 141* 107*  BUN 57* 61* 58*  CREATININE 2.26* 2.36*  2.32*  CALCIUM  8.1* 8.3* 8.4*  MG 2.2  --   --    Liver Function Tests Recent Labs    01/29/24 0425  AST 60*  ALT 55*  ALKPHOS 58  BILITOT 0.5  PROT 5.4*  ALBUMIN 1.6*   No results for input(s): LIPASE, AMYLASE in the last 72 hours. Cardiac Enzymes No results for input(s): CKTOTAL, CKMB, CKMBINDEX, TROPONINI in the last 72 hours.  BNP: BNP (last 3 results) Recent Labs    07/11/23 0653  BNP 125.3*    ProBNP (last 3 results) No results for input(s): PROBNP in the last 8760 hours.   D-Dimer No results for input(s): DDIMER in the last 72 hours. Hemoglobin A1C No results for input(s): HGBA1C in the last 72 hours. Fasting Lipid Panel No results for input(s): CHOL, HDL, LDLCALC, TRIG, CHOLHDL, LDLDIRECT in the last 72 hours. Thyroid  Function Tests No results for input(s): TSH, T4TOTAL, T3FREE, THYROIDAB in the last 72 hours.  Invalid input(s): FREET3  Other results:   Imaging    No results found.   Medications:     Scheduled Medications:  atorvastatin   20 mg Oral Daily   Chlorhexidine  Gluconate Cloth  6 each Topical Daily   donepezil   5 mg Oral QHS   DULoxetine   60 mg Oral BID   famotidine   20  mg Oral Daily   furosemide   40 mg Intravenous BID   gabapentin   300 mg Oral TID   lactulose   20 g Oral Daily   levothyroxine   50 mcg Oral QAC breakfast   melatonin  10 mg Oral QHS   metoprolol  tartrate  25 mg Oral BID   multivitamin  1 tablet Oral Daily   Rivaroxaban   20 mg Oral Q supper   senna-docusate  1 tablet Oral QHS   sodium chloride  flush  10-40 mL Intracatheter Q12H    Infusions:  amiodarone  60 mg/hr (01/31/24 0740)    PRN Medications: acetaminophen , diclofenac  Sodium, guaiFENesin-dextromethorphan, HYDROcodone -acetaminophen , ondansetron  (ZOFRAN ) IV, polyethylene glycol, sodium chloride  flush    Patient Profile   IVON OELKERS is a 83 y.o. male with chronic HFpEF, PAF on Xarelto , OA, venous stasis, CAD,  GERD, HLD, OSA on CPAP and obesity. AHF team to see with A/C HFpEF.   Assessment/Plan   A/C HFpEF - Echo 9/25: EF 55%. Mild concentric LVH, RV mildly reduced, RA mildly dilated, trivial MR, mild dilatation of aortic root 39 mm. Mild dilatation of ascending aorta 39 mm. Small pericardial effusion present.  - NYHA IV on admission w/ marked volume overload.  - He has diuresed. Now w/ AKI, SCr now up to 2.3. Co-ox marginal yesterday at 55%, repeat pending. CVPs widely variable. Rounding note from yesterday reported CVP to be ~20. Today w/ variable measurements from 4-13.   Assessment of volume status by physical exam also difficult given obesity/body habitus. May benefit from RHC.  - already received AM dose of IV Lasix . May need to hold afternoon dose  - Continue UNNA boots  - BB per EP - Strict I&O. Daily weights    A fib/Flu RVR - EP following - Failed DCCV x2 in the setting of volume overload - Continue xarelto  - Continue IV amiodarone  - Currently not a candidate for AVJ + PPM with acute conditions.  - Consider repeat DCCV once diuresed   AKI on CKD stage 3a - Baseline SCr ~ 1.4-1.6 - Scr now up to 2.3. Check repeat co-ox. CVPs variable. May need RHC. Will d/w MD  - follow BMET today - avoid hypotension   Acute cystitis d/t Klebsiella pneumoniae - mgmt per primary team - Has completed abx.    OSA noncompliant with CPAP - Discussed importance of compliance    Morbid obesity - Body mass index is 42.46 kg/m.     Length of Stay: [address] PA-C  01/31/2024, 9:11 AM  Advanced Heart Failure Team Pager [phone] (M-F; 7a - 5p)  Please contact [facility] for night-coverage after hours (5p -7a ) and weekends on [email or web address]   Patient seen with PA, I formulated the plan and agree with the above note.   Creatinine 2.36 => 2.32.  He got Lasix  40 mg IV x 1 this morning.  CVP 7 on my read but has been variable.   He remains in atrial fibrillation on amiodarone  gtt 60  mg/hr.  Rate is controlled.   General: NAD Neck: Thick, JVP difficult, no thyromegaly or thyroid  nodule.  Lungs: Clear to auscultation bilaterally with normal respiratory effort. CV: Nondisplaced PMI.  Heart irregular S1/S2, no S3/S4, no murmur.  Lower legs with unna boots.  Abdomen: Soft, nontender, no hepatosplenomegaly, no distention.  Skin: Intact without lesions or rashes.  Neurologic: Alert and oriented x 3.  Psych: Normal affect. Extremities: No clubbing or cyanosis.  HEENT: Normal.   HFpEF, volume status difficult on exam.  CVPs have been variable but I get 7 this morning.  He had Lasix  40 mg IV this morning.  Creatinine 2.36 => 2.32.  - Hold further Lasix  today.  - Given difficulty with volume assessment even with PICC in place, will plan formal RHC tomorrow to make sure volume status is optimized.  If so, start on po torsemide tomorrow.   He remains in AF on amiodarone  gtt and Xarelto .  Now that he has diuresed, will set up for repeat DCCV after RHC.   Discussed risks/benefits of procedures with patient, he agrees to them.   Ezra Shuck 01/31/2024 10:01 AM

## 2024-01-31 NOTE — Progress Notes (Signed)
 Carlos Hanson  FMW:[id number] DOB: 1941-03-10 DOA: 01/21/2024 PCP: Yolande Toribio [name], MD    Brief Narrative:  83 year old with a history of PAF on Xarelto , OSA noncompliant with CPAP, venous stasis insufficiency, chronic low back pain, CAD, GERD with HH, gout, PUD, HLD, morbid obesity, and chronic constipation who presented to the [facility] with 3 weeks of worsening leg swelling after having been seen by his PCP 2 days prior when he was newly prescribed Lasix .  While in the ER he developed RVR and IV Cardizem  was initiated.  This had to be switched to IV amiodarone  due to a drop in his blood pressures.  He was transferred to [facility] for further care.  Significant Events: 9/19 presented to [facility] - admitted 9/22 DCCV x 3 - unsuccessful 9/24 DCCV x 1 -unsuccessful  Goals of Care:   Code Status: Full Code   DVT prophylaxis: Place TED hose Start: 01/21/24 2316 rivaroxaban  (XARELTO ) tablet 20 mg   Interim Hx: Refused CPAP again last night.  Afebrile.  Vital signs stable.  Heart rate presently controlled at 51-80.  Resting comfortably in bed with no complaints.  Assessment & Plan:  Paroxysmal atrial fibrillation with acute RVR TSH stable - TTE 9/22 noted EF 55% - status post multiple cardioversion attempts on 9/22 and 9/24 which were unsuccessful - Cardiology directing care of this issue - consideration being given to eventual AV node ablation with pacemaker placement if a final attempt at cardioversion is not successful - volume status optimized - continue amiodarone , Xarelto , metoprolol  - maximize potassium -for probable DCCV cardioversion tomorrow if right heart cath goes well  Acute exacerbation of chronic diastolic congestive heart failure - lower extremity edema -chronic venous insufficiency Duplex negative for DVT - EF 55% via TTE this admit - diuresis is being titrated during hospital stay based upon renal function - creatinine appears to have stabilized at this  time at approximately 2.3 - net negative approximately 13.5 L since admission -for right heart cath tomorrow  Filed Weights   01/28/24 0407 01/29/24 0421 01/29/24 0500  Weight: 120.2 kg 130.7 kg 130.7 kg     CKD stage IIIb Baseline creatinine 1.6-1.9 -creatinine has increased with use of diuretic, but appears to have stabilized for now  Recent Labs  Lab 01/27/24 0627 01/28/24 1104 01/29/24 0425 01/30/24 0238 01/31/24 0550  CREATININE 1.52* 1.92* 2.26* 2.36* 2.32*     Acute Klebsiella pneumoniae cystitis 01/21/24 - resolved Has completed a course of antibiotic therapy  Acute on chronic wrist pain - resolved  Plain film x-rays have revealed degenerative joint disease - uric acid is normal - pain resolved for now   HLD Continue statin  Hypothyroidism Continue usual Synthroid  dose  OSA noncompliant with CPAP as outpatient Continue attempts at nighttime CPAP, though patient is refusing - connection between decompensated heart failure as well as arrhythmia and CPAP noncompliance explained to patient  Mild dementia/depression Continue usual duloxetine  Aricept  and Neurontin   Morbid obesity - Body mass index is 40.2 kg/m.   Family Communication: No family present at time of exam today Disposition:   Skilled Nursing-Short Term Rehab (<3 Hours/Day)01/28/2024 1400  Objective: Blood pressure (!) 114/55, pulse (!) 58, temperature 98.2 F (36.8 C), temperature source Oral, resp. rate 18, height 5' 11 (1.803 m), weight 130.7 kg, SpO2 93%.  Intake/Output Summary (Last 24 hours) at 01/31/2024 0810 Last data filed at 01/31/2024 0025 Gross per 24 hour  Intake 1573.01 ml  Output 1650 ml  Net -76.99  ml   Filed Weights   01/28/24 0407 01/29/24 0421 01/29/24 0500  Weight: 120.2 kg 130.7 kg 130.7 kg    Examination: General: No acute respiratory distress Lungs: Clear to auscultation bilaterally - distant BS th/o due to body habitus  Cardiovascular: Regular rate without  murmur Abdomen: Nontender, obese, nondistended, soft, bowel sounds positive, no rebound, no ascites, no appreciable mass Extremities: 1+ bilateral lower extremity edema without significant change  CBC: Recent Labs  Lab 01/26/24 0322 01/27/24 0627 01/29/24 0425  WBC 11.9* 8.3 7.9  HGB 10.9* 11.9* 10.3*  HCT 33.8* 37.5* 32.1*  MCV 94.4 94.5 93.0  PLT 307 304 390   Basic Metabolic Panel: Recent Labs  Lab 01/29/24 0425 01/30/24 0238 01/31/24 0550  NA 133* 135 137  K 3.1* 3.9 3.6  CL 88* 92* 94*  CO2 30 31 31   GLUCOSE 181* 141* 107*  BUN 57* 61* 58*  CREATININE 2.26* 2.36* 2.32*  CALCIUM  8.1* 8.3* 8.4*  MG 2.2  --   --    GFR: Estimated Creatinine Clearance: 32.7 mL/min (A) (by C-G formula based on SCr of 2.32 mg/dL (H)).   Scheduled Meds:  atorvastatin   20 mg Oral Daily   Chlorhexidine  Gluconate Cloth  6 each Topical Daily   donepezil   5 mg Oral QHS   DULoxetine   60 mg Oral BID   famotidine   20 mg Oral Daily   furosemide   40 mg Intravenous BID   gabapentin   300 mg Oral TID   lactulose   20 g Oral Daily   levothyroxine   50 mcg Oral QAC breakfast   melatonin  10 mg Oral QHS   metoprolol  tartrate  25 mg Oral BID   multivitamin  1 tablet Oral Daily   Rivaroxaban   20 mg Oral Q supper   senna-docusate  1 tablet Oral QHS   sodium chloride  flush  10-40 mL Intracatheter Q12H   Continuous Infusions:  amiodarone  60 mg/hr (01/31/24 0740)     LOS: 10 days   Reyes IVAR Moores, MD [facility] Office  [phone] Pager - Text Page per Tracey  If 7PM-7AM, please contact night-coverage per Amion 01/31/2024, 8:10 AM

## 2024-02-01 ENCOUNTER — Encounter (HOSPITAL_COMMUNITY)
Admission: EM | Disposition: A | Discharge status: Skilled Nursing Facility | Payer: Self-pay | Source: Home / Self Care | Attending: Internal Medicine

## 2024-02-01 ENCOUNTER — Encounter (HOSPITAL_COMMUNITY): Payer: Self-pay | Admitting: Cardiology

## 2024-02-01 ENCOUNTER — Ambulatory Visit (HOSPITAL_COMMUNITY): Admission: RE | Admit: 2024-02-01 | Source: Home / Self Care | Admitting: Cardiology

## 2024-02-01 ENCOUNTER — Inpatient Hospital Stay (HOSPITAL_COMMUNITY): Admitting: Anesthesiology

## 2024-02-01 DIAGNOSIS — I2510 Atherosclerotic heart disease of native coronary artery without angina pectoris: Secondary | ICD-10-CM

## 2024-02-01 DIAGNOSIS — I5033 Acute on chronic diastolic (congestive) heart failure: Secondary | ICD-10-CM | POA: Diagnosis not present

## 2024-02-01 DIAGNOSIS — I4891 Unspecified atrial fibrillation: Secondary | ICD-10-CM

## 2024-02-01 HISTORY — PX: CARDIOVERSION: EP1203

## 2024-02-01 HISTORY — PX: RIGHT HEART CATH: CATH118263

## 2024-02-01 LAB — BASIC METABOLIC PANEL WITH GFR
Anion gap: 8 (ref 5–15)
BUN: 51 mg/dL — ABNORMAL HIGH (ref 8–23)
CO2: 32 mmol/L (ref 22–32)
Calcium: 8.5 mg/dL — ABNORMAL LOW (ref 8.9–10.3)
Chloride: 97 mmol/L — ABNORMAL LOW (ref 98–111)
Creatinine, Ser: 2.13 mg/dL — ABNORMAL HIGH (ref 0.61–1.24)
GFR, Estimated: 30 mL/min — ABNORMAL LOW (ref >60)
Glucose, Bld: 97 mg/dL (ref 70–99)
Potassium: 3.8 mmol/L (ref 3.5–5.1)
Sodium: 137 mmol/L (ref 135–145)

## 2024-02-01 LAB — MAGNESIUM: Magnesium: 2.3 mg/dL (ref 1.7–2.4)

## 2024-02-01 LAB — COOXEMETRY PANEL
Carboxyhemoglobin: 1.1 % (ref 0.5–1.5)
Methemoglobin: <0.7 % (ref 0.0–1.5)
O2 Saturation: 54.8 %
Total hemoglobin: 11.3 g/dL — ABNORMAL LOW (ref 12.0–16.0)

## 2024-02-01 LAB — POCT I-STAT EG7
Acid-Base Excess: 7 mmol/L — ABNORMAL HIGH (ref 0.0–2.0)
Acid-Base Excess: 9 mmol/L — ABNORMAL HIGH (ref 0.0–2.0)
Bicarbonate: 33.7 mmol/L — ABNORMAL HIGH (ref 20.0–28.0)
Bicarbonate: 35.5 mmol/L — ABNORMAL HIGH (ref 20.0–28.0)
Calcium, Ion: 1.05 mmol/L — ABNORMAL LOW (ref 1.15–1.40)
Calcium, Ion: 1.15 mmol/L (ref 1.15–1.40)
HCT: 34 % — ABNORMAL LOW (ref 39.0–52.0)
HCT: 35 % — ABNORMAL LOW (ref 39.0–52.0)
Hemoglobin: 11.6 g/dL — ABNORMAL LOW (ref 13.0–17.0)
Hemoglobin: 11.9 g/dL — ABNORMAL LOW (ref 13.0–17.0)
O2 Saturation: 52 %
O2 Saturation: 53 %
Potassium: 3.6 mmol/L (ref 3.5–5.1)
Potassium: 3.8 mmol/L (ref 3.5–5.1)
Sodium: 138 mmol/L (ref 135–145)
Sodium: 140 mmol/L (ref 135–145)
TCO2: 35 mmol/L — ABNORMAL HIGH (ref 22–32)
TCO2: 37 mmol/L — ABNORMAL HIGH (ref 22–32)
pCO2, Ven: 54 mmHg (ref 44–60)
pCO2, Ven: 56.8 mmHg (ref 44–60)
pH, Ven: 7.403 (ref 7.25–7.43)
pH, Ven: 7.404 (ref 7.25–7.43)
pO2, Ven: 28 mmHg — CL (ref 32–45)
pO2, Ven: 29 mmHg — CL (ref 32–45)

## 2024-02-01 LAB — CBC
HCT: 33.8 % — ABNORMAL LOW (ref 39.0–52.0)
Hemoglobin: 10.8 g/dL — ABNORMAL LOW (ref 13.0–17.0)
MCH: 29.8 pg (ref 26.0–34.0)
MCHC: 32 g/dL (ref 30.0–36.0)
MCV: 93.1 fL (ref 80.0–100.0)
Platelets: 528 K/uL — ABNORMAL HIGH (ref 150–400)
RBC: 3.63 MIL/uL — ABNORMAL LOW (ref 4.22–5.81)
RDW: 14.6 % (ref 11.5–15.5)
WBC: 8 K/uL (ref 4.0–10.5)
nRBC: 0 % (ref 0.0–0.2)

## 2024-02-01 MED ORDER — LIDOCAINE HCL (PF) 1 % IJ SOLN
INTRAMUSCULAR | Status: DC | PRN
  Administered 2024-02-01: 2 mL via INTRADERMAL

## 2024-02-01 MED ORDER — FUROSEMIDE 10 MG/ML IJ SOLN
60.0000 mg | Freq: Once | INTRAMUSCULAR | Status: AC
  Administered 2024-02-01: 60 mg via INTRAVENOUS
  Filled 2024-02-01: qty 6

## 2024-02-01 MED ORDER — SODIUM CHLORIDE 0.9% FLUSH
3.0000 mL | Freq: Two times a day (BID) | INTRAVENOUS | Status: DC
  Administered 2024-02-01: 3 mL via INTRAVENOUS

## 2024-02-01 MED ORDER — HEPARIN (PORCINE) IN NACL 1000-0.9 UT/500ML-% IV SOLN
INTRAVENOUS | Status: DC | PRN
  Administered 2024-02-01: 500 mL

## 2024-02-01 MED ORDER — SODIUM CHLORIDE 0.9% FLUSH: 3.0000 mL | INTRAVENOUS | Status: DC | PRN

## 2024-02-01 MED ORDER — LIDOCAINE HCL (PF) 1 % IJ SOLN
INTRAMUSCULAR | Status: AC
  Filled 2024-02-01: qty 30

## 2024-02-01 MED ORDER — POTASSIUM CHLORIDE CRYS ER 20 MEQ PO TBCR
40.0000 meq | EXTENDED_RELEASE_TABLET | Freq: Once | ORAL | Status: AC
  Administered 2024-02-01: 40 meq via ORAL
  Filled 2024-02-01: qty 2

## 2024-02-01 MED ORDER — SODIUM CHLORIDE 0.9 % IV SOLN: INTRAVENOUS | Status: DC

## 2024-02-01 MED ORDER — PROPOFOL 10 MG/ML IV BOLUS
INTRAVENOUS | Status: DC | PRN
  Administered 2024-02-01: 50 mg via INTRAVENOUS

## 2024-02-01 MED ORDER — METOPROLOL SUCCINATE ER 25 MG PO TB24: 25.0000 mg | ORAL_TABLET | Freq: Every day | ORAL | Status: DC

## 2024-02-01 MED ORDER — LIDOCAINE 2% (20 MG/ML) 5 ML SYRINGE
INTRAMUSCULAR | Status: DC | PRN
  Administered 2024-02-01: 100 mg via INTRAVENOUS

## 2024-02-01 MED ORDER — SODIUM CHLORIDE 0.9 % IV SOLN: 250.0000 mL | INTRAVENOUS | Status: DC | PRN

## 2024-02-01 NOTE — Progress Notes (Signed)
 Pt received from cath lab with a right brachial access level 0. Pt alert and oriented x 3.     02/01/24 1235  Vitals  Temp 97.6 F (36.4 C)  Temp Source Oral  BP (!) 129/46  MAP (mmHg) 67  BP Location Left Leg  BP Method Automatic  Patient Position (if appropriate) Lying  Pulse Rate 60  Pulse Rate Source Monitor  ECG Heart Rate 66  Resp 16  Level of Consciousness  Level of Consciousness Alert  MEWS COLOR  MEWS Score Color Green  Oxygen Therapy  SpO2 96 %  O2 Device Room Air  Pain Assessment  Pain Scale 0-10  Pain Score 0  MEWS Score  MEWS Temp 0  MEWS Systolic 0  MEWS Pulse 0  MEWS RR 0  MEWS LOC 0  MEWS Score 0

## 2024-02-01 NOTE — Transfer of Care (Signed)
 Immediate Anesthesia Transfer of Care Note  Patient: Carlos Hanson  Procedure(s) Performed: CARDIOVERSION  Patient Location: PACU and Cath Lab  Anesthesia Type:MAC  Level of Consciousness: drowsy and patient cooperative  Airway & Oxygen Therapy: Patient Spontanous Breathing and Patient connected to nasal cannula oxygen  Post-op Assessment: Report given to RN, Post -op Vital signs reviewed and stable, Patient moving all extremities X 4, and Patient able to stick tongue midline  Post vital signs: Reviewed and stable  Last Vitals:  Vitals Value Taken Time  BP 106/47   Temp 36   Pulse 74 02/01/24 11:02  Resp 24 02/01/24 11:02  SpO2 98 % 02/01/24 11:02  Vitals shown include unfiled device data.  Last Pain:  Vitals:   02/01/24 1053  TempSrc:   PainSc: 0-No pain      Patients Stated Pain Goal: 0 (01/26/24 2000)  Complications: No notable events documented.

## 2024-02-01 NOTE — Progress Notes (Addendum)
 Carlos Hanson  FMW:[id number] DOB: 12-22-1940 DOA: 01/21/2024 PCP: Yolande Toribio [name], MD    Brief Narrative:  83 year old with a history of PAF on Xarelto , OSA noncompliant with CPAP, venous stasis insufficiency, chronic low back pain, CAD, GERD with HH, gout, PUD, HLD, morbid obesity, and chronic constipation who presented to the [facility] with 3 weeks of worsening leg swelling after having been seen by his PCP 2 days prior when he was newly prescribed Lasix .  While in the ER he developed RVR and IV Cardizem  was initiated.  This had to be switched to IV amiodarone  due to a drop in his blood pressures.  He was transferred to [facility] for further care.  Significant Events: 9/19 presented to [facility] - admitted 9/22 DCCV x 3 - unsuccessful 9/24 DCCV x 1 -unsuccessful  Goals of Care:   Code Status: Full Code   DVT prophylaxis: Place TED hose Start: 01/21/24 2316 rivaroxaban  (XARELTO ) tablet 20 mg   Interim Hx: Afebrile.  Vital signs stable.  Remains in atrial fibrillation with controlled heart rate.  Creatinine stable/slightly improved.  Patient is seen in his room post procedures.  He is resting comfortably but mildly confused.  He has no focal complaints.  Assessment & Plan:  Paroxysmal atrial fibrillation with acute RVR TSH stable - TTE 9/22 noted EF 55% - status post multiple cardioversion attempts on 9/22 and 9/24 which were unsuccessful - Cardiology directing care of this issue - consideration being given to eventual AV node ablation with pacemaker placement if a final attempt at cardioversion is not successful - volume status optimized - continue amiodarone , Xarelto , metoprolol  - maximize potassium - repeat DCCV today resulted in resumption of NSR  Acute exacerbation of chronic diastolic congestive heart failure - lower extremity edema -chronic venous insufficiency Duplex negative for DVT - EF 55% via TTE this admit - diuresis is being titrated during hospital stay  based upon renal function - creatinine appears to have stabilized at this time at approximately 2.1-2.3 - net negative approximately 15.5 L since admission - right heart cath today - weights not accurate    CKD stage IIIb Baseline creatinine 1.6-1.9 -creatinine has increased with use of diuretic, but appears to have stabilized for now  Recent Labs  Lab 01/28/24 1104 01/29/24 0425 01/30/24 0238 01/31/24 0550 02/01/24 0500  CREATININE 1.92* 2.26* 2.36* 2.32* 2.13*    Waxing and waning mental status No focal neurologic deficits on physical exam today -suspect this is sundowning as well as exposure to multiple sedating medications for procedures during this day -family confirms it has occurred off his own throughout his hospital stay -attempt to assure he is sleeping well each night -encouraged consistent use of CPAP  Acute Klebsiella pneumoniae cystitis 01/21/24 - resolved Has completed a course of antibiotic therapy  Acute on chronic wrist pain - resolved  Plain film x-rays have revealed degenerative joint disease - uric acid is normal - pain resolved for now   HLD Continue statin  Hypothyroidism Continue usual Synthroid  dose  OSA noncompliant with CPAP as outpatient Continue attempts at nighttime CPAP, though patient is refusing - connection between decompensated heart failure as well as arrhythmia and CPAP noncompliance explained to patient  Mild dementia/depression Continue usual duloxetine  Aricept  and Neurontin   Morbid obesity - Body mass index is 40.2 kg/m.   Family Communication: Spoke with wife and daughter-in-law at bedside at length Disposition:   Skilled Nursing-Short Term Rehab (<3 Hours/Day)01/28/2024 1400  Objective: Blood pressure (!) 129/46,  pulse 60, temperature 97.6 F (36.4 C), temperature source Oral, resp. rate 16, height 5' 11 (1.803 m), weight 130.7 kg, SpO2 96%.  Intake/Output Summary (Last 24 hours) at 02/01/2024 1641 Last data filed at 02/01/2024  0700 Gross per 24 hour  Intake 399 ml  Output 1350 ml  Net -951 ml   Filed Weights   01/29/24 0421 01/29/24 0500  Weight: 130.7 kg 130.7 kg    Examination: General: No acute respiratory distress Lungs: Clear to auscultation bilaterally - distant BS th/o due to body habitus  Cardiovascular: Regular rate without murmur Abdomen: Nontender, obese, nondistended, soft, bowel sounds positive, no rebound, no ascites, no appreciable mass Extremities: 1+ bilateral lower extremity edema without significant change -Unna boots in place bilaterally  CBC: Recent Labs  Lab 01/27/24 0627 01/29/24 0425 02/01/24 0500 02/01/24 0936 02/01/24 0939  WBC 8.3 7.9 8.0  --   --   HGB 11.9* 10.3* 10.8* 11.6* 11.9*  HCT 37.5* 32.1* 33.8* 34.0* 35.0*  MCV 94.5 93.0 93.1  --   --   PLT 304 390 528*  --   --    Basic Metabolic Panel: Recent Labs  Lab 01/29/24 0425 01/30/24 0238 01/31/24 0550 02/01/24 0500 02/01/24 0936 02/01/24 0939  NA 133* 135 137 137 140 138  K 3.1* 3.9 3.6 3.8 3.6 3.8  CL 88* 92* 94* 97*  --   --   CO2 30 31 31  32  --   --   GLUCOSE 181* 141* 107* 97  --   --   BUN 57* 61* 58* 51*  --   --   CREATININE 2.26* 2.36* 2.32* 2.13*  --   --   CALCIUM  8.1* 8.3* 8.4* 8.5*  --   --   MG 2.2  --   --  2.3  --   --    GFR: Estimated Creatinine Clearance: 35.6 mL/min (A) (by C-G formula based on SCr of 2.13 mg/dL (H)).   Scheduled Meds:  atorvastatin   20 mg Oral Daily   Chlorhexidine  Gluconate Cloth  6 each Topical Daily   donepezil   5 mg Oral QHS   DULoxetine   60 mg Oral BID   famotidine   20 mg Oral Daily   gabapentin   300 mg Oral TID   lactulose   20 g Oral Daily   levothyroxine   50 mcg Oral QAC breakfast   melatonin  10 mg Oral QHS   multivitamin  1 tablet Oral Daily   Rivaroxaban   20 mg Oral Q supper   senna-docusate  1 tablet Oral QHS   sodium chloride  flush  10-40 mL Intracatheter Q12H   Continuous Infusions:  amiodarone  30 mg/hr (02/01/24 1423)     LOS: 11  days   Reyes IVAR Moores, MD [facility] Office  [phone] Pager - Text Page per Tracey  If 7PM-7AM, please contact night-coverage per Amion 02/01/2024, 4:41 PM

## 2024-02-01 NOTE — Procedures (Signed)
 Electrical Cardioversion Procedure Note LIEUTENANT ABARCA [id number] 1940/11/17  Procedure: Electrical Cardioversion Indications:  Atrial Fibrillation  Procedure Details Consent: Risks of procedure as well as the alternatives and risks of each were explained to the (patient/caregiver).  Consent for procedure obtained. Time Out: Verified patient identification, verified procedure, site/side was marked, verified correct patient position, special equipment/implants available, medications/allergies/relevent history reviewed, required imaging and test results available.  Performed  Patient placed on cardiac monitor, pulse oximetry, supplemental oxygen as necessary.  Sedation given: Propofol  per anesthesiology Pacer pads placed anterior and posterior chest.  Cardioverted 1 time(s).  Cardioverted at 360J.  Evaluation Findings: Post procedure EKG shows: NSR Complications: None Patient did tolerate procedure well.   Ezra Shuck 02/01/2024, 11:14 AM

## 2024-02-01 NOTE — H&P (View-Only) (Signed)
 Patient ID: [name], male   DOB: [birth year], 83 y.o.   MRN: [id number]     Advanced Heart Failure Rounding Note  Cardiologist: Gordy Bergamo, MD  Chief Complaint: A/C HFpEF Subjective:    Diuretics reduced over the weekend, after developing AKI. Scr 1.45>>1.52>>1.92>>2.3>>2.13.  He had 1 dose Lasix  40 mg IV yesterday, CVP 8-9 today but baseline very labile.  Co-ox 55%.   Remains in Afib, rate controlled 70s on amiodarone  gtt 60 mg/hr.   Denies dyspnea.   Objective:   Weight Range: 130.7 kg Body mass index is 40.2 kg/m.   Vital Signs:   Temp:  [97.9 F (36.6 C)-98.4 F (36.9 C)] 97.9 F (36.6 C) (09/29 2324) Pulse Rate:  [60-106] 60 (09/30 0600) Resp:  [17-20] 18 (09/30 0600) BP: (98-119)/(61-75) 105/72 (09/29 2324) SpO2:  [94 %-98 %] 94 % (09/30 0600) Last BM Date : 01/29/24  Weight change: Filed Weights   01/29/24 0421 01/29/24 0500  Weight: 130.7 kg 130.7 kg    Intake/Output:   Intake/Output Summary (Last 24 hours) at 02/01/2024 0734 Last data filed at 02/01/2024 0611 Gross per 24 hour  Intake 480 ml  Output 2150 ml  Net -1670 ml    Physical Exam   General: NAD Neck: JVP difficult, no thyromegaly or thyroid  nodule.  Lungs: Clear to auscultation bilaterally with normal respiratory effort. CV: Nondisplaced PMI.  Heart irregular S1/S2, no S3/S4, no murmur.  1+ edema to knees.   Abdomen: Soft, nontender, no hepatosplenomegaly, no distention.  Skin: Intact without lesions or rashes.  Neurologic: Alert and oriented x 3.  Psych: Normal affect. Extremities: No clubbing or cyanosis.  HEENT: Normal.    Telemetry   Atrial fibrillation in 70s (Personally reviewed)    Labs    CBC Recent Labs    02/01/24 0500  WBC 8.0  HGB 10.8*  HCT 33.8*  MCV 93.1  PLT 528*   Basic Metabolic Panel Recent Labs    90/70/74 0550 02/01/24 0500  NA 137 137  K 3.6 3.8  CL 94* 97*  CO2 31 32  GLUCOSE 107* 97  BUN 58* 51*  CREATININE 2.32* 2.13*  CALCIUM  8.4* 8.5*   MG  --  2.3   Liver Function Tests No results for input(s): AST, ALT, ALKPHOS, BILITOT, PROT, ALBUMIN in the last 72 hours.  No results for input(s): LIPASE, AMYLASE in the last 72 hours. Cardiac Enzymes No results for input(s): CKTOTAL, CKMB, CKMBINDEX, TROPONINI in the last 72 hours.  BNP: BNP (last 3 results) Recent Labs    07/11/23 0653  BNP 125.3*    ProBNP (last 3 results) No results for input(s): PROBNP in the last 8760 hours.   D-Dimer No results for input(s): DDIMER in the last 72 hours. Hemoglobin A1C No results for input(s): HGBA1C in the last 72 hours. Fasting Lipid Panel No results for input(s): CHOL, HDL, LDLCALC, TRIG, CHOLHDL, LDLDIRECT in the last 72 hours. Thyroid  Function Tests No results for input(s): TSH, T4TOTAL, T3FREE, THYROIDAB in the last 72 hours.  Invalid input(s): FREET3  Other results:   Imaging    No results found.   Medications:     Scheduled Medications:  atorvastatin   20 mg Oral Daily   Chlorhexidine  Gluconate Cloth  6 each Topical Daily   donepezil   5 mg Oral QHS   DULoxetine   60 mg Oral BID   famotidine   20 mg Oral Daily   furosemide   60 mg Intravenous Once   gabapentin   300 mg Oral  TID   lactulose   20 g Oral Daily   levothyroxine   50 mcg Oral QAC breakfast   melatonin  10 mg Oral QHS   metoprolol  succinate  25 mg Oral Daily   multivitamin  1 tablet Oral Daily   potassium chloride   40 mEq Oral Once   Rivaroxaban   20 mg Oral Q supper   senna-docusate  1 tablet Oral QHS   sodium chloride  flush  10-40 mL Intracatheter Q12H   sodium chloride  flush  3 mL Intravenous Q12H    Infusions:  sodium chloride      sodium chloride      amiodarone  60 mg/hr (02/01/24 0217)    PRN Medications: sodium chloride , acetaminophen , diclofenac  Sodium, guaiFENesin-dextromethorphan, HYDROcodone -acetaminophen , ondansetron  (ZOFRAN ) IV, polyethylene glycol, sodium chloride  flush, sodium  chloride flush    Patient Profile   JAKOBE BLAU is a 83 y.o. male with chronic HFpEF, PAF on Xarelto , OA, venous stasis, CAD, GERD, HLD, OSA on CPAP and obesity. AHF team to see with A/C HFpEF.   Assessment/Plan   A/C HFpEF - Echo 9/25: EF 55%. Mild concentric LVH, RV mildly reduced, RA mildly dilated, trivial MR, mild dilatation of aortic root 39 mm. Mild dilatation of ascending aorta 39 mm. Small pericardial effusion present.  - NYHA IV on admission w/ marked volume overload.  - He has diuresed. Now w/ AKI, SCr now up to 2.3 but trending down today to 2.1. Co-ox 55%. CVP seems 8-9 this morning but very labile.  Exam difficult.  Will plan RHC today to formally assess filling pressures.  Discussed risks/benefits with patient and he agrees to procedure.  I will give him Lasix  60 mg IV x 1.  - Continue UNNA boots  - Strict I&O. Daily weights => no weight for a couple days now.    A fib/Flu RVR - EP following - Failed DCCV x 2 in the setting of volume overload - Continue Xarelto  - Continue IV amiodarone  at 60 mg/hr.  - Currently not a candidate for AVJ + PPM with acute conditions.  - Repeat DCCV today.  Discussed risks/benefits with patient and he agrees to procedure.    AKI on CKD stage 3a - Baseline SCr ~ 1.4-1.6 - Scr now up to 2.3 => 2.13. RHC as above.  - avoid hypotension   Acute cystitis d/t Klebsiella pneumoniae - mgmt per primary team - Has completed abx.    OSA noncompliant with CPAP - Discussed importance of compliance    Morbid obesity - Body mass index is 42.46 kg/m.     Length of Stay: 65  Ezra Shuck, MD  02/01/2024, 7:34 AM  Advanced Heart Failure Team Pager [phone] (M-F; 7a - 5p)  Please contact [facility] for night-coverage after hours (5p -7a ) and weekends on [email or web address]

## 2024-02-01 NOTE — Interval H&P Note (Signed)
 History and Physical Interval Note:  02/01/2024 11:06 AM  Carlos Hanson  has presented today for surgery, with the diagnosis of afib.  The various methods of treatment have been discussed with the patient and family. After consideration of risks, benefits and other options for treatment, the patient has consented to  Procedure(s): CARDIOVERSION (N/A) as a surgical intervention.  The patient's history has been reviewed, patient examined, no change in status, stable for surgery.  I have reviewed the patient's chart and labs.  Questions were answered to the patient's satisfaction.     Vinaya Sancho [employer]

## 2024-02-01 NOTE — Progress Notes (Signed)
 Pt converted to AFIB . EKG done MD paged
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
## 2024-02-01 NOTE — Progress Notes (Signed)
 Received patient from 4E. Amiodarone  infusing at 60mg /hr to red port of left upper arm double lumen PIC line.

## 2024-02-01 NOTE — Anesthesia Preprocedure Evaluation (Addendum)
 Anesthesia Evaluation  Patient identified by MRN, date of birth, ID band Patient awake    Reviewed: Allergy & Precautions, NPO status , Patient's Chart, lab work & pertinent test results  Airway Mallampati: III  TM Distance: >3 FB Neck ROM: Full    Dental  (+) Edentulous Upper, Edentulous Lower, Dental Advisory Given   Pulmonary sleep apnea and Continuous Positive Airway Pressure Ventilation , former smoker   breath sounds clear to auscultation       Cardiovascular hypertension, Pt. on medications + CAD and +CHF  + dysrhythmias (xarelto ) Atrial Fibrillation  Rhythm:Irregular     Neuro/Psych  PSYCHIATRIC DISORDERS  Depression    negative neurological ROS     GI/Hepatic Neg liver ROS, hiatal hernia,GERD  ,,  Endo/Other  Hypothyroidism  Class 3 obesity (BMI 43)  Renal/GU Renal InsufficiencyRenal disease     Musculoskeletal  (+) Arthritis ,    Abdominal   Peds  Hematology negative hematology ROS (+)   Anesthesia Other Findings   Reproductive/Obstetrics                              Anesthesia Physical Anesthesia Plan  ASA: 3  Anesthesia Plan: General   Post-op Pain Management:    Induction: Intravenous  PONV Risk Score and Plan: Treatment may vary due to age or medical condition  Airway Management Planned: Nasal Cannula, Natural Airway and Simple Face Mask  Additional Equipment: None  Intra-op Plan:   Post-operative Plan:   Informed Consent: I have reviewed the patients History and Physical, chart, labs and discussed the procedure including the risks, benefits and alternatives for the proposed anesthesia with the patient or authorized representative who has indicated his/her understanding and acceptance.     Dental advisory given  Plan Discussed with: CRNA  Anesthesia Plan Comments:          Anesthesia Quick Evaluation

## 2024-02-01 NOTE — Anesthesia Postprocedure Evaluation (Signed)
 Anesthesia Post Note  Patient: Carlos Hanson  Procedure(s) Performed: CARDIOVERSION     Patient location during evaluation: PACU Anesthesia Type: General Level of consciousness: awake and alert Pain management: pain level controlled Vital Signs Assessment: post-procedure vital signs reviewed and stable Respiratory status: spontaneous breathing, nonlabored ventilation and respiratory function stable Cardiovascular status: blood pressure returned to baseline and stable Postop Assessment: no apparent nausea or vomiting Anesthetic complications: no   No notable events documented.  Last Vitals:  Vitals:   02/01/24 1205 02/01/24 1235  BP: (!) 118/50 (!) 129/46  Pulse:  60  Resp: 14 16  Temp:  36.4 C  SpO2:  96%    Last Pain:  Vitals:   02/01/24 1235  TempSrc: Oral  PainSc:                  Butler Levander [place]

## 2024-02-01 NOTE — Progress Notes (Signed)
 Patient ID: Carlos Hanson, male   DOB: [birth year], 83 y.o.   MRN: [id number]     Advanced Heart Failure Rounding Note  Cardiologist: Gordy Bergamo, MD  Chief Complaint: A/C HFpEF Subjective:    Diuretics reduced over the weekend, after developing AKI. Scr 1.45>>1.52>>1.92>>2.3>>2.13.  He had 1 dose Lasix  40 mg IV yesterday, CVP 8-9 today but baseline very labile.  Co-ox 55%.   Remains in Afib, rate controlled 70s on amiodarone  gtt 60 mg/hr.   Denies dyspnea.   Objective:   Weight Range: 130.7 kg Body mass index is 40.2 kg/m.   Vital Signs:   Temp:  [97.9 F (36.6 C)-98.4 F (36.9 C)] 97.9 F (36.6 C) (09/29 2324) Pulse Rate:  [60-106] 60 (09/30 0600) Resp:  [17-20] 18 (09/30 0600) BP: (98-119)/(61-75) 105/72 (09/29 2324) SpO2:  [94 %-98 %] 94 % (09/30 0600) Last BM Date : 01/29/24  Weight change: Filed Weights   01/29/24 0421 01/29/24 0500  Weight: 130.7 kg 130.7 kg    Intake/Output:   Intake/Output Summary (Last 24 hours) at 02/01/2024 0734 Last data filed at 02/01/2024 0611 Gross per 24 hour  Intake 480 ml  Output 2150 ml  Net -1670 ml    Physical Exam   General: NAD Neck: JVP difficult, no thyromegaly or thyroid  nodule.  Lungs: Clear to auscultation bilaterally with normal respiratory effort. CV: Nondisplaced PMI.  Heart irregular S1/S2, no S3/S4, no murmur.  1+ edema to knees.   Abdomen: Soft, nontender, no hepatosplenomegaly, no distention.  Skin: Intact without lesions or rashes.  Neurologic: Alert and oriented x 3.  Psych: Normal affect. Extremities: No clubbing or cyanosis.  HEENT: Normal.    Telemetry   Atrial fibrillation in 70s (Personally reviewed)    Labs    CBC Recent Labs    02/01/24 0500  WBC 8.0  HGB 10.8*  HCT 33.8*  MCV 93.1  PLT 528*   Basic Metabolic Panel Recent Labs    90/70/74 0550 02/01/24 0500  NA 137 137  K 3.6 3.8  CL 94* 97*  CO2 31 32  GLUCOSE 107* 97  BUN 58* 51*  CREATININE 2.32* 2.13*  CALCIUM  8.4* 8.5*   MG  --  2.3   Liver Function Tests No results for input(s): AST, ALT, ALKPHOS, BILITOT, PROT, ALBUMIN in the last 72 hours.  No results for input(s): LIPASE, AMYLASE in the last 72 hours. Cardiac Enzymes No results for input(s): CKTOTAL, CKMB, CKMBINDEX, TROPONINI in the last 72 hours.  BNP: BNP (last 3 results) Recent Labs    07/11/23 0653  BNP 125.3*    ProBNP (last 3 results) No results for input(s): PROBNP in the last 8760 hours.   D-Dimer No results for input(s): DDIMER in the last 72 hours. Hemoglobin A1C No results for input(s): HGBA1C in the last 72 hours. Fasting Lipid Panel No results for input(s): CHOL, HDL, LDLCALC, TRIG, CHOLHDL, LDLDIRECT in the last 72 hours. Thyroid  Function Tests No results for input(s): TSH, T4TOTAL, T3FREE, THYROIDAB in the last 72 hours.  Invalid input(s): FREET3  Other results:   Imaging    No results found.   Medications:     Scheduled Medications:  atorvastatin   20 mg Oral Daily   Chlorhexidine  Gluconate Cloth  6 each Topical Daily   donepezil   5 mg Oral QHS   DULoxetine   60 mg Oral BID   famotidine   20 mg Oral Daily   furosemide   60 mg Intravenous Once   gabapentin   300 mg Oral  TID   lactulose   20 g Oral Daily   levothyroxine   50 mcg Oral QAC breakfast   melatonin  10 mg Oral QHS   metoprolol  succinate  25 mg Oral Daily   multivitamin  1 tablet Oral Daily   potassium chloride   40 mEq Oral Once   Rivaroxaban   20 mg Oral Q supper   senna-docusate  1 tablet Oral QHS   sodium chloride  flush  10-40 mL Intracatheter Q12H   sodium chloride  flush  3 mL Intravenous Q12H    Infusions:  sodium chloride      sodium chloride      amiodarone  60 mg/hr (02/01/24 0217)    PRN Medications: sodium chloride , acetaminophen , diclofenac  Sodium, guaiFENesin-dextromethorphan, HYDROcodone -acetaminophen , ondansetron  (ZOFRAN ) IV, polyethylene glycol, sodium chloride  flush, sodium  chloride flush    Patient Profile   JAKOBE BLAU is a 83 y.o. male with chronic HFpEF, PAF on Xarelto , OA, venous stasis, CAD, GERD, HLD, OSA on CPAP and obesity. AHF team to see with A/C HFpEF.   Assessment/Plan   A/C HFpEF - Echo 9/25: EF 55%. Mild concentric LVH, RV mildly reduced, RA mildly dilated, trivial MR, mild dilatation of aortic root 39 mm. Mild dilatation of ascending aorta 39 mm. Small pericardial effusion present.  - NYHA IV on admission w/ marked volume overload.  - He has diuresed. Now w/ AKI, SCr now up to 2.3 but trending down today to 2.1. Co-ox 55%. CVP seems 8-9 this morning but very labile.  Exam difficult.  Will plan RHC today to formally assess filling pressures.  Discussed risks/benefits with patient and he agrees to procedure.  I will give him Lasix  60 mg IV x 1.  - Continue UNNA boots  - Strict I&O. Daily weights => no weight for a couple days now.    A fib/Flu RVR - EP following - Failed DCCV x 2 in the setting of volume overload - Continue Xarelto  - Continue IV amiodarone  at 60 mg/hr.  - Currently not a candidate for AVJ + PPM with acute conditions.  - Repeat DCCV today.  Discussed risks/benefits with patient and he agrees to procedure.    AKI on CKD stage 3a - Baseline SCr ~ 1.4-1.6 - Scr now up to 2.3 => 2.13. RHC as above.  - avoid hypotension   Acute cystitis d/t Klebsiella pneumoniae - mgmt per primary team - Has completed abx.    OSA noncompliant with CPAP - Discussed importance of compliance    Morbid obesity - Body mass index is 42.46 kg/m.     Length of Stay: 65  Ezra Shuck, MD  02/01/2024, 7:34 AM  Advanced Heart Failure Team Pager [phone] (M-F; 7a - 5p)  Please contact [facility] for night-coverage after hours (5p -7a ) and weekends on [email or web address]

## 2024-02-01 NOTE — Anesthesia Procedure Notes (Signed)
 Procedure Name: MAC Date/Time: 02/01/2024 11:07 AM  Performed by: Viviana Almarie DASEN, CRNAPre-anesthesia Checklist: Patient identified, Suction available, Patient being monitored, Emergency Drugs available and Timeout performed Patient Re-evaluated:Patient Re-evaluated prior to induction Oxygen Delivery Method: Nasal cannula Induction Type: IV induction

## 2024-02-01 NOTE — Progress Notes (Signed)
   02/01/24 2000  BiPAP/CPAP/SIPAP  Reason BIPAP/CPAP not in use Non-compliant

## 2024-02-01 NOTE — Progress Notes (Signed)
 PT Cancellation Note  Patient Details Name: Carlos Hanson MRN: [id number] DOB: 12-03-1940   Cancelled Treatment:    Reason Eval/Treat Not Completed: Patient at procedure or test/unavailable  Off unit for procedure this morning. Will attempt later today as schedule permits vs tomorrow for PT follow-up.  Leontine Roads, PT, DPT [facility]  Rehabilitation Services Physical Therapist Office: [phone] Website: [email or web address]   Leontine GORMAN Roads 02/01/2024, 11:12 AM

## 2024-02-01 NOTE — Progress Notes (Signed)
 I was made aware that the patient was more bradycardic post cardioversion.   Rhythm appears to be sinus brady 40s-50s with PACs. Check ECG to confirm.  Reduce amiodarone  to 30 mg/hr. Stop po metoprolol .

## 2024-02-01 NOTE — Interval H&P Note (Signed)
 History and Physical Interval Note:  02/01/2024 9:23 AM  Carlos Hanson  has presented today for surgery, with the diagnosis of heart failure.  The various methods of treatment have been discussed with the patient and family. After consideration of risks, benefits and other options for treatment, the patient has consented to  Procedure(s): RIGHT HEART CATH (N/A) as a surgical intervention.  The patient's history has been reviewed, patient examined, no change in status, stable for surgery.  I have reviewed the patient's chart and labs.  Questions were answered to the patient's satisfaction.     Maxten Shuler [employer]

## 2024-02-02 DIAGNOSIS — I480 Paroxysmal atrial fibrillation: Secondary | ICD-10-CM

## 2024-02-02 DIAGNOSIS — I4891 Unspecified atrial fibrillation: Secondary | ICD-10-CM | POA: Diagnosis not present

## 2024-02-02 DIAGNOSIS — I5033 Acute on chronic diastolic (congestive) heart failure: Secondary | ICD-10-CM | POA: Diagnosis not present

## 2024-02-02 DIAGNOSIS — I10 Essential (primary) hypertension: Secondary | ICD-10-CM | POA: Diagnosis not present

## 2024-02-02 DIAGNOSIS — L8990 Pressure ulcer of unspecified site, unspecified stage: Secondary | ICD-10-CM | POA: Diagnosis present

## 2024-02-02 DIAGNOSIS — E66813 Obesity, class 3: Secondary | ICD-10-CM | POA: Diagnosis present

## 2024-02-02 DIAGNOSIS — F0390 Unspecified dementia without behavioral disturbance: Secondary | ICD-10-CM

## 2024-02-02 DIAGNOSIS — E785 Hyperlipidemia, unspecified: Secondary | ICD-10-CM

## 2024-02-02 DIAGNOSIS — N1832 Chronic kidney disease, stage 3b: Secondary | ICD-10-CM | POA: Diagnosis not present

## 2024-02-02 LAB — BASIC METABOLIC PANEL WITH GFR
Anion gap: 12 (ref 5–15)
BUN: 48 mg/dL — ABNORMAL HIGH (ref 8–23)
CO2: 29 mmol/L (ref 22–32)
Calcium: 8.4 mg/dL — ABNORMAL LOW (ref 8.9–10.3)
Chloride: 95 mmol/L — ABNORMAL LOW (ref 98–111)
Creatinine, Ser: 2.02 mg/dL — ABNORMAL HIGH (ref 0.61–1.24)
GFR, Estimated: 32 mL/min — ABNORMAL LOW (ref >60)
Glucose, Bld: 146 mg/dL — ABNORMAL HIGH (ref 70–99)
Potassium: 4.1 mmol/L (ref 3.5–5.1)
Sodium: 136 mmol/L (ref 135–145)

## 2024-02-02 LAB — COOXEMETRY PANEL
Carboxyhemoglobin: 1.9 % — ABNORMAL HIGH (ref 0.5–1.5)
Methemoglobin: <0.7 % (ref 0.0–1.5)
O2 Saturation: 64.8 %
Total hemoglobin: 11.5 g/dL — ABNORMAL LOW (ref 12.0–16.0)

## 2024-02-02 LAB — MAGNESIUM: Magnesium: 2.4 mg/dL (ref 1.7–2.4)

## 2024-02-02 MED ORDER — METOPROLOL SUCCINATE ER 25 MG PO TB24
25.0000 mg | ORAL_TABLET | Freq: Every day | ORAL | Status: DC
  Administered 2024-02-02 – 2024-02-03 (×2): 25 mg via ORAL
  Filled 2024-02-02 (×2): qty 1

## 2024-02-02 MED ORDER — TORSEMIDE 20 MG PO TABS
40.0000 mg | ORAL_TABLET | Freq: Every day | ORAL | Status: DC
  Administered 2024-02-02 – 2024-02-03 (×2): 40 mg via ORAL
  Filled 2024-02-02 (×2): qty 2

## 2024-02-02 MED ORDER — AMIODARONE HCL 200 MG PO TABS
200.0000 mg | ORAL_TABLET | Freq: Two times a day (BID) | ORAL | Status: DC
  Administered 2024-02-02 – 2024-02-03 (×3): 200 mg via ORAL
  Filled 2024-02-02 (×3): qty 1

## 2024-02-02 NOTE — Assessment & Plan Note (Signed)
 Calculated BMI is 40.2

## 2024-02-02 NOTE — Assessment & Plan Note (Signed)
 Continue statin therapy

## 2024-02-02 NOTE — Evaluation (Signed)
 Occupational Therapy Evaluation Patient Details Name: Carlos Hanson MRN: [id number] DOB: 04/28/1941 Today's Date: 02/02/2024   History of Present Illness   Pt is a 83 y.o. male  who presented to [facility] 9/19 for BLE swelling x 3 weeks. Transferred to [facility] d/t Afib with RVR, CHF.  Cardioversion x2 & RHC.   PMH: A-fib o, OA, venous stasis insufficiency, CAD, history of bleeding PUD, HLD, OSA     Clinical Impressions Pt admitted based on above, and was seen based on problem list below. PTA pt was living with [place] in [place]. Wife reports pt requiring more assist with ADLs than before. Today pt is requiring mod to total +2 assist for  ADLs. Bed mobility and functional transfers are  total +2. Pt with decreased cog, difficulty with sustaining arousal, following commands, and sequencing tasks. Pt limited by BUE weakness causing difficulty self-feeding and grooming seated EOB. Recommending <3 hours of skilled rehab daily. OT will continue to follow acutely to maximize functional independence.        If plan is discharge home, recommend the following:   Two people to help with walking and/or transfers;Two people to help with bathing/dressing/bathroom;Supervision due to cognitive status     Functional Status Assessment   Patient has had a recent decline in their functional status and demonstrates the ability to make significant improvements in function in a reasonable and predictable amount of time.     Equipment Recommendations   Other (comment) (Defer to next venue)      Precautions/Restrictions   Precautions Precautions: Fall Recall of Precautions/Restrictions: Impaired Restrictions Weight Bearing Restrictions Per Provider Order: No     Mobility Bed Mobility Overal bed mobility: Needs Assistance Bed Mobility: Supine to Sit, Sit to Supine     Supine to sit: +2 for physical assistance, +2 for safety/equipment, Max assist Sit to supine: Total assist, +2 for physical  assistance, +2 for safety/equipment   General bed mobility comments: max assist +2 to come EOB. Pt slightly assisting legs and trunk. Total +2 to return to bed, pt in pain not assisting    Transfers Overall transfer level: Needs assistance Equipment used: Ambulation equipment used Transfers: Sit to/from Stand Sit to Stand: Via lift equipment, Total assist, +2 physical assistance, +2 safety/equipment           General transfer comment: With use of stefy pt with difficulty grasping rails, often with posterior lean unable to fully clear hips after x4 attempts with total +2 assist Transfer via Lift Equipment: Stedy    Balance Overall balance assessment: Needs assistance Sitting-balance support: Bilateral upper extremity supported, Feet supported Sitting balance-Leahy Scale: Poor Sitting balance - Comments: CGA to min assist sitting EOB with UEs supported.   Standing balance support: Bilateral upper extremity supported, Reliant on assistive device for balance Standing balance-Leahy Scale: Zero Standing balance comment: Stedy         ADL either performed or assessed with clinical judgement   ADL Overall ADL's : Needs assistance/impaired Eating/Feeding: Moderate assistance;Sitting Eating/Feeding Details (indicate cue type and reason): Poor grasp, poor to fair hand to mouth Grooming: Moderate assistance;Sitting   Upper Body Bathing: Maximal assistance;Sitting   Lower Body Bathing: Total assistance;Bed level   Upper Body Dressing : Maximal assistance;Sitting   Lower Body Dressing: Total assistance;Bed level                 General ADL Comments: BUE weakness limiting pt. CGA to sit EOB     Vision Patient Visual Report:  No change from baseline Additional Comments: Will continue to assess            Pertinent Vitals/Pain Pain Assessment Pain Assessment: Faces Faces Pain Scale: Hurts whole lot Pain Location: back Pain Descriptors / Indicators: Aching Pain  Intervention(s): Limited activity within patient's tolerance     Extremity/Trunk Assessment Upper Extremity Assessment Upper Extremity Assessment: Generalized weakness;RUE deficits/detail;LUE deficits/detail RUE Deficits / Details: Significant weakness, BUEs ~30 degrees AROM, MMT 2-/5 RUE Coordination: decreased fine motor;decreased gross motor LUE Deficits / Details: Significant weakness, BUEs ~30 degrees AROM, MMT 2-/5 LUE Coordination: decreased fine motor;decreased gross motor   Lower Extremity Assessment Lower Extremity Assessment: Defer to PT evaluation       Communication Communication Communication: Impaired Factors Affecting Communication: Reduced clarity of speech   Cognition Arousal: Lethargic Behavior During Therapy: Flat affect, Agitated Cognition: Cognition impaired   Orientation impairments: Place, Situation, Time Awareness: Intellectual awareness impaired, Online awareness impaired Memory impairment (select all impairments): Short-term memory, Working memory Attention impairment (select first level of impairment): Focused attention, Sustained attention, Selective attention, Divided attention, Alternating attention Executive functioning impairment (select all impairments): Initiation, Organization, Sequencing, Reasoning, Problem solving OT - Cognition Comments: pt lethargic, borderline obtunded. Pt able to recognize wife and improved slightly with her presence. Eyes closed most of session, difficulty following commands       Following commands: Impaired Following commands impaired: Follows one step commands with increased time, Follows one step commands inconsistently     Cueing  General Comments   Cueing Techniques: Verbal cues;Gestural cues;Tactile cues;Visual cues  Skin tear on pt's L lateral forearm and bleeding. RN notified           Home Living Family/patient expects to be discharged to:: Private residence Living Arrangements: Spouse/significant  other Available Help at Discharge: Family Type of Home: Independent living facility Home Access: Level entry           Bathroom Shower/Tub: Chief Strategy Officer: Handicapped height Bathroom Accessibility: Yes How Accessible: Accessible via walker Home Equipment: Cane - single point;Grab bars - tub/shower;Shower seat - built in;Rollator (4 wheels);Lift chair;Hand held shower head          Prior Functioning/Environment Prior Level of Function : History of Falls (last six months);Needs assist             Mobility Comments: Wife reports hx of falls, use of rollator ADLs Comments: Wife reports typically mod I but has declined and requiring assist    OT Problem List: Decreased strength;Decreased range of motion;Decreased activity tolerance;Impaired balance (sitting and/or standing);Decreased cognition;Cardiopulmonary status limiting activity;Decreased knowledge of use of DME or AE   OT Treatment/Interventions: Self-care/ADL training;Therapeutic exercise;Energy conservation;DME and/or AE instruction;Therapeutic activities;Cognitive remediation/compensation;Patient/family education;Balance training      OT Goals(Current goals can be found in the care plan section)   Acute Rehab OT Goals Patient Stated Goal: For pt to get better OT Goal Formulation: With family Time For Goal Achievement: 02/16/24 Potential to Achieve Goals: Good   OT Frequency:  Min 2X/week    Co-evaluation PT/OT/SLP Co-Evaluation/Treatment: Yes Reason for Co-Treatment: Necessary to address cognition/behavior during functional activity;To address functional/ADL transfers;For patient/therapist safety   OT goals addressed during session: ADL's and self-care      AM-PAC OT 6 Clicks Daily Activity     Outcome Measure Help from another person eating meals?: A Lot Help from another person taking care of personal grooming?: A Lot Help from another person toileting, which includes using  toliet, bedpan, or urinal?:  Total Help from another person bathing (including washing, rinsing, drying)?: Total Help from another person to put on and taking off regular upper body clothing?: A Lot Help from another person to put on and taking off regular lower body clothing?: Total 6 Click Score: 9   End of Session Equipment Utilized During Treatment: Gait belt;Other (comment) Carlos Hanson) Nurse Communication: Mobility status;Other (comment) (Need for feeder)  Activity Tolerance: Patient limited by pain Patient left: in bed;with call bell/phone within reach;with family/visitor present  OT Visit Diagnosis: Unsteadiness on feet (R26.81);Other abnormalities of gait and mobility (R26.89);Muscle weakness (generalized) (M62.81);History of falling (Z91.81)                Time: 8960-8875 OT Time Calculation (min): 45 min Charges:  OT General Charges $OT Visit: 1 Visit OT Evaluation $OT Eval Moderate Complexity: 1 Mod OT Treatments $Self Care/Home Management : 8-22 mins  Adrianne BROCKS, OT  Acute Rehabilitation Services Office [phone] Secure chat preferred   Adrianne GORMAN [employer] 02/02/2024, 1:32 PM

## 2024-02-02 NOTE — Progress Notes (Signed)
 Physical Therapy Treatment Patient Details Name: Carlos Hanson MRN: [id number] DOB: [birth year] Today's Date: 02/02/2024   History of Present Illness Pt is a 83 y.o. male  who presented to [facility] 9/19 for BLE swelling x 3 weeks. Transferred to [facility] d/t Afib with RVR, CHF.  Cardioversion x2 & RHC.   PMH: A-fib o, OA, venous stasis insufficiency, CAD, history of bleeding PUD, HLD, OSA    PT Comments  Slow progress towards goals. Currently pt was able to get to EOB at [facility] +2 and attempt standing EOB with use of STEDY for 4 attempts at 2 person Max A to 2 person Total A. Pt with poor awareness of body mechanics and difficulty following directions. Due to pt current functional status, home set up and available assistance at home recommending skilled physical therapy services < 3 hours/day in order to address strength, balance and functional mobility to decrease risk for falls, injury, immobility, skin break down and re-hospitalization.      If plan is discharge home, recommend the following: Assistance with cooking/housework;Help with stairs or ramp for entrance;Assist for transportation;Two people to help with walking and/or transfers;Two people to help with bathing/dressing/bathroom   Can travel by private vehicle     No  Equipment Recommendations  Hospital bed;Hoyer lift;Wheelchair cushion (measurements PT);Wheelchair (measurements PT)       Precautions / Restrictions Precautions Precautions: Fall Recall of Precautions/Restrictions: Impaired Restrictions Weight Bearing Restrictions Per Provider Order: No     Mobility  Bed Mobility Overal bed mobility: Needs Assistance Bed Mobility: Supine to Sit, Sit to Supine     Supine to sit: +2 for physical assistance, +2 for safety/equipment, Max assist Sit to supine: Total assist, +2 for physical assistance, +2 for safety/equipment   General bed mobility comments: max assist +2 to come EOB. Pt slightly assisting legs and trunk. Total +2 to  return to bed, pt in pain not assisting    Transfers Overall transfer level: Needs assistance Equipment used: Ambulation equipment used Transfers: Sit to/from Stand Sit to Stand: Via lift equipment, Total assist, +2 physical assistance, +2 safety/equipment           General transfer comment: With use of stedy pt with difficulty grasping rails, often with posterior lean unable to fully clear hips after x4 attempts with total +2 assist. Pt initially actively resisting with UE; improved with cues to hold onto therapists arms and use of bed pad to lift hips off EOB. Transfer via Lift Equipment: Stedy     Balance Overall balance assessment: Needs assistance Sitting-balance support: Bilateral upper extremity supported, Feet supported Sitting balance-Leahy Scale: Poor Sitting balance - Comments: CGA to min assist sitting EOB with UEs supported.   Standing balance support: Bilateral upper extremity supported, Reliant on assistive device for balance Standing balance-Leahy Scale: Zero Standing balance comment: Clinical cytogeneticist Communication: Impaired Factors Affecting Communication: Reduced clarity of speech  Cognition Arousal: Lethargic Behavior During Therapy: Flat affect, Agitated   PT - Cognitive impairments: Difficult to assess, Awareness, Attention, Sequencing, Problem solving, Safety/Judgement, Orientation   Orientation impairments: Place, Time, Situation       Following commands: Impaired Following commands impaired: Follows one step commands with increased time, Follows one step commands inconsistently    Cueing Cueing Techniques: Verbal cues, Gestural cues, Tactile cues, Visual cues     General Comments General comments (skin integrity, edema, etc.): Skin tear on pt's L lateral forearm; bleeding minimally; RN notified. MD and RN  aware of pt level of arousal and orientation status.      Pertinent Vitals/Pain Pain Assessment Pain Assessment:  Faces Faces Pain Scale: Hurts whole lot Pain Location: back Pain Descriptors / Indicators: Aching Pain Intervention(s): Monitored during session, Limited activity within patient's tolerance    Home Living Family/patient expects to be discharged to:: Private residence Living Arrangements: Spouse/significant other Available Help at Discharge: Family Type of Home: Independent living facility Home Access: Level entry         Home Equipment: Cane - single point;Grab bars - tub/shower;Shower seat - built in;Rollator (4 wheels);Lift chair;Hand held shower head          PT Goals (current goals can now be found in the care plan section) Acute Rehab PT Goals Patient Stated Goal: unable to state PT Goal Formulation: With family Time For Goal Achievement: 02/10/24 Potential to Achieve Goals: Fair Progress towards PT goals: Progressing toward goals    Frequency    Min 2X/week      PT Plan  Continue with current POC     Co-evaluation PT/OT/SLP Co-Evaluation/Treatment: Yes Reason for Co-Treatment: Necessary to address cognition/behavior during functional activity;To address functional/ADL transfers;For patient/therapist safety PT goals addressed during session: Mobility/safety with mobility;Balance OT goals addressed during session: ADL's and Hanson-care      AM-PAC PT 6 Clicks Mobility   Outcome Measure  Help needed turning from your back to your side while in a flat bed without using bedrails?: A Lot Help needed moving from lying on your back to sitting on the side of a flat bed without using bedrails?: Total Help needed moving to and from a bed to a chair (including a wheelchair)?: Total Help needed standing up from a chair using your arms (e.g., wheelchair or bedside chair)?: Total Help needed to walk in hospital room?: Total Help needed climbing 3-5 steps with a railing? : Total 6 Click Score: 7    End of Session Equipment Utilized During Treatment: Gait  belt Activity Tolerance: Patient limited by fatigue;Patient limited by pain Patient left: in bed;with call bell/phone within reach;with bed alarm set;with family/visitor present Nurse Communication: Mobility status;Need for lift equipment PT Visit Diagnosis: Muscle weakness (generalized) (M62.81);Unsteadiness on feet (R26.81);Other abnormalities of gait and mobility (R26.89);Difficulty in walking, not elsewhere classified (R26.2);Other symptoms and signs involving the nervous system (R29.898);Pain Pain - part of body:  (back)     Time: 8957-8874 PT Time Calculation (min) (ACUTE ONLY): 43 min  Charges:    $Therapeutic Activity: 8-22 mins PT General Charges $$ ACUTE PT VISIT: 1 Visit                    Dorothyann Maier, DPT, CLT  Acute Rehabilitation Services Office: [phone] (Secure chat preferred)    Dorothyann VEAR Maier 02/02/2024, 2:00 PM

## 2024-02-02 NOTE — Progress Notes (Signed)
 Progress Note   Patient: Carlos Hanson FMW:[id number] DOB: 1941/01/16 DOA: 01/21/2024     12 DOS: the patient was seen and examined on 02/02/2024   Brief hospital course: Carlos Hanson was admitted to the hospital with the working diagnosis of heart failure exacerbation.   83 y.o. male with PMH of paroxysmal A-fib, osteoarthritis, venous stasis insufficiency, chronic back pain, CAD, GERD, hiatal hernia, gout, history of bleeding PUD, hyperlipidemia, obstructive sleep apnea on CPAP, class II obesity and chronic constipation who presented with worsening edema.   He was seen by PCP 2 days prior to admission and prescribed furosemide  and advised to continue elevation and compression, without improvement.  On his initial physical examination HR initially in the 50s to 70s, SBP 110-130s, SpO2 96% on room air.  Initial labs significant for BUN/creatinine 36/1.57, WBC 12.5 Hgb 11.0, lactic acid 0.9,  UA shows negative nitrite, moderate leuks, WBC 11-20 and many bacteria.  Subsequently went into A-fib with RVR.,  Started on iV Cardizem  and due to soft BP, changed to IV amiodarone .  U/S LLE negative for DVT. S/p IV Rocephin .  UA with WBC 11-20 urine culture sent  9/22: Underwent cardioversion x 3 without success 9/24: DCCV again unsuccessful 09/30 direct current cardioversion, briefly successful, then back in atrial fibrillation.     Assessment and Plan: * Acute on chronic diastolic heart failure (HCC) Echocardiogram with preserved LV systolic function EF 55%, mild LVH, RV systolic function with mild reduction, RA with mild dilatation, small pericardial effusion,   Urine output 1000 ml Systolic blood pressure 120 mmHg range  Plan to continue metoprolol  succinate and torsemide   Essential hypertension Continue blood pressure control with metoprolol .   Paroxysmal atrial fibrillation (HCC) Patient failed direct current cardioversion  Plan to continue amiodarone  oral load and  metoprolol  Anticoagulation with rivaroxaban  Continue telemetry monitoring   CKD stage 3b, GFR 30-44 ml/min (HCC) Rena function with serum cr at 2,0 with K at 4.1 and serum bicarbonate at 29  Na 136 and Mg 2.4   Plan to continue torsemide for diuresis  Follow up renal function and electrolytes.,   Hyperlipidemia Continue statin therapy   Hypothyroidism Continue levothyroxine    Chronic venous insufficiency of lower extremity Continue leg elevation   Chronic low back pain Continue pain control with hydrocodone    Urinary tract infection with hematuria Resolved   OSA (obstructive sleep apnea) cpap  Pressure injury of skin Continue local care  Obesity, class 3 (HCC) Calculated BMI is 40.2  Dementia (HCC) Acute metabolic encephalopathy, patient more confused and disorientated not at his baseline Positive signs of delirium  Plan to continue supportive medical therapy  Continue duloxetine , donepezil  and melatonin If somnolence will check VBG         Subjective: Patient has been confused and disorientated, at times somnolent.   Physical Exam: Vitals:   02/02/24 1000 02/02/24 1207 02/02/24 1236 02/02/24 1239  BP: (!) 110/54 (!) 102/51 (!) 120/58 (!) 120/58  Pulse:  100    Resp:  20    Temp:  (!) 97.1 F (36.2 C)    TempSrc:  Axillary    SpO2:  93%    Weight:      Height:       Neurology awake at the time of my examination, confused and disorientated ENT with mild pallor Cardiovascular with S1 and S2 present, irregularly irregular with no gallop or rubs, positive systolic murmur at the apex Respiratory with poor inspiratory effort with no wheezing or rhonchi,  mild rales at bases Abdomen with no distention, soft and non tender, protuberant   Trace lower extremity edema  Data Reviewed:    Family Communication: I spoke with patient's wife at the bedside, we talked in detail about patient's condition, plan of care and prognosis and all questions were  addressed.   Disposition: Status is: Inpatient Remains inpatient appropriate because: recovering heart failure   Planned Discharge Destination: Skilled nursing facility     Author: Elidia Toribio Furnace, MD 02/02/2024 1:16 PM  For on call review [email or web address].

## 2024-02-02 NOTE — Assessment & Plan Note (Signed)
 Patient failed direct current cardioversion  Plan to continue amiodarone  oral load and metoprolol  Anticoagulation with rivaroxaban .

## 2024-02-02 NOTE — Assessment & Plan Note (Signed)
 Acute metabolic encephalopathy, with delirium. Patient was placed on supportive medical therapy  Continue duloxetine , donepezil  and melatonin.   At the time of discharge his encephalopathy has resolved.

## 2024-02-02 NOTE — Assessment & Plan Note (Signed)
 Continue blood pressure control with metoprolol.

## 2024-02-02 NOTE — Assessment & Plan Note (Signed)
 Continue pain control with hydrocodone 

## 2024-02-02 NOTE — Assessment & Plan Note (Signed)
 Rena function with serum cr at 2,0 with K at 4.1 and serum bicarbonate at 29  Na 136 and Mg 2.4   Plan to continue torsemide for diuresis  Follow up renal function and electrolytes.,

## 2024-02-02 NOTE — Assessment & Plan Note (Signed)
 cpap

## 2024-02-02 NOTE — Assessment & Plan Note (Signed)
Continue leg elevation

## 2024-02-02 NOTE — TOC Progression Note (Signed)
 Transition of Care [facility]) - Progression Note    Patient Details  Name: Carlos Hanson MRN: [id number] Date of Birth: 12/04/1940  Transition of Care [facility]) CM/SW Contact  [name], [name] Phone Number: 02/02/2024, 12:01 PM  Clinical Narrative:     TOC continuing to follow  Received authorization on 9/29 for SNF and PTAR for [facility]  Updated SNF do not anticipate patient will d/c today but will update if any changes.   Montie Louder, MSW, LCSW Clinical Social Worker    Expected Discharge Plan: Skilled Nursing Facility Barriers to Discharge: Continued Medical Work up               Expected Discharge Plan and Services   Discharge Planning Services: CM Consult Post Acute Care Choice: Home Health Living arrangements for the past 2 months: Apartment, Independent Living Facility                                       Social Drivers of Health (SDOH) Interventions SDOH Screenings   Food Insecurity: No Food Insecurity (01/21/2024)  Housing: Low Risk  (01/21/2024)  Transportation Needs: No Transportation Needs (01/21/2024)  Utilities: Not At Risk (01/21/2024)  Social Connections: Moderately Isolated (01/21/2024)  Tobacco Use: Medium Risk (01/26/2024)    Readmission Risk Interventions     No data to display

## 2024-02-02 NOTE — Progress Notes (Addendum)
 Patient ID: Carlos Hanson, male   DOB: 1941/04/18, 83 y.o.   MRN: [id number]     Advanced Heart Failure Rounding Note  Cardiologist: Gordy Bergamo, MD  Consulting HF Cardiologist: Dr. Rolan  Chief Complaint: A/C HFpEF  Subjective:    9/30: RHC with optimized filling pressures and preserved CO. DCCV from Afib to SR. Back in AF several hours later.  Remains in AF this am, rate 80s-90s on amiodarone  gtt at 30/hr.    CO-OX 65% without inotrope support.  CVP 8.   Has been noncompliant with CPAP. Sleeping at time of evaluation. Machine at bedside.   Objective:   Weight Range: 130.7 kg Body mass index is 40.2 kg/m.   Vital Signs:   Temp:  [97.2 F (36.2 C)-98.6 F (37 C)] 97.5 F (36.4 C) (10/01 0757) Pulse Rate:  [41-98] 98 (10/01 0757) Resp:  [13-24] 17 (10/01 0532) BP: (90-135)/(41-95) 107/56 (10/01 0757) SpO2:  [91 %-100 %] 96 % (10/01 0757) Last BM Date : 01/29/24  Weight change: Filed Weights   01/29/24 0421 01/29/24 0500  Weight: 130.7 kg 130.7 kg    Intake/Output:   Intake/Output Summary (Last 24 hours) at 02/02/2024 1000 Last data filed at 02/02/2024 0610 Gross per 24 hour  Intake 445.75 ml  Output 1000 ml  Net -554.25 ml    Physical Exam   General:  Elderly male Cor: JVP difficult d/t thick neck. Irregular rhythm. No murmur. Lungs: breathing nonlabored Abdomen: obese, soft, nondistended Extremities: 1+ edema, + UNNA boots Neuro: alert & orientedx3. Affect pleasant   Telemetry   Afib 80s-90s  Labs    CBC Recent Labs    02/01/24 0500 02/01/24 0936 02/01/24 0939  WBC 8.0  --   --   HGB 10.8* 11.6* 11.9*  HCT 33.8* 34.0* 35.0*  MCV 93.1  --   --   PLT 528*  --   --    Basic Metabolic Panel Recent Labs    90/69/74 0500 02/01/24 0936 02/01/24 0939 02/02/24 0500  NA 137   < > 138 136  K 3.8   < > 3.8 4.1  CL 97*  --   --  95*  CO2 32  --   --  29  GLUCOSE 97  --   --  146*  BUN 51*  --   --  48*  CREATININE 2.13*  --   --  2.02*   CALCIUM  8.5*  --   --  8.4*  MG 2.3  --   --  2.4   < > = values in this interval not displayed.   Liver Function Tests No results for input(s): AST, ALT, ALKPHOS, BILITOT, PROT, ALBUMIN in the last 72 hours.  No results for input(s): LIPASE, AMYLASE in the last 72 hours. Cardiac Enzymes No results for input(s): CKTOTAL, CKMB, CKMBINDEX, TROPONINI in the last 72 hours.  BNP: BNP (last 3 results) Recent Labs    07/11/23 0653  BNP 125.3*    ProBNP (last 3 results) No results for input(s): PROBNP in the last 8760 hours.   D-Dimer No results for input(s): DDIMER in the last 72 hours. Hemoglobin A1C No results for input(s): HGBA1C in the last 72 hours. Fasting Lipid Panel No results for input(s): CHOL, HDL, LDLCALC, TRIG, CHOLHDL, LDLDIRECT in the last 72 hours. Thyroid  Function Tests No results for input(s): TSH, T4TOTAL, T3FREE, THYROIDAB in the last 72 hours.  Invalid input(s): FREET3  Other results:   Imaging    EP STUDY Result  Date: 02/01/2024 See surgical note for result.    Medications:     Scheduled Medications:  atorvastatin   20 mg Oral Daily   Chlorhexidine  Gluconate Cloth  6 each Topical Daily   donepezil   5 mg Oral QHS   DULoxetine   60 mg Oral BID   famotidine   20 mg Oral Daily   gabapentin   300 mg Oral TID   lactulose   20 g Oral Daily   levothyroxine   50 mcg Oral QAC breakfast   melatonin  10 mg Oral QHS   multivitamin  1 tablet Oral Daily   Rivaroxaban   20 mg Oral Q supper   senna-docusate  1 tablet Oral QHS   sodium chloride  flush  10-40 mL Intracatheter Q12H    Infusions:  amiodarone  30 mg/hr (02/02/24 0813)    PRN Medications: acetaminophen , diclofenac  Sodium, guaiFENesin-dextromethorphan, HYDROcodone -acetaminophen , ondansetron  (ZOFRAN ) IV, polyethylene glycol, sodium chloride  flush    Patient Profile   Carlos Hanson is a 83 y.o. male with chronic HFpEF, PAF on Xarelto , OA,  venous stasis, CAD, GERD, HLD, OSA on CPAP and obesity. AHF team to see with A/C HFpEF.   Assessment/Plan   A/C HFpEF - Echo 9/25: EF 55%. Mild concentric LVH, RV mildly reduced, RA mildly dilated, trivial MR, mild dilatation of aortic root 39 mm. Mild dilatation of ascending aorta 39 mm. Small pericardial effusion present.  - NYHA IV on admission w/ marked volume overload.  - He has diuresed. Now w/ AKI, SCr now up to 2.3 but trending down slowly, 2.0 today. Co-ox 65%. CVP 8, also 8 yesterday. RHC 09/30 with optimized filling pressures and preserved CO.   - No SGLT2i with recent UTI and mobility - Continue UNNA boots  - Strict I&O. Daily weights => no weight for about 4 days now   A fib/Flu RVR - EP followed earlier this admit - Failed DCCV x 2 in the setting of volume overload - Continue Xarelto  - Currently not a candidate for AVJ + PPM with acute conditions.  - Cardioversion reattempted 09/30 after diuresis. Initially successful but back in AF with controlled rate several hours later.  - Continues on amiodarone  gtt at 30/hr - Not sure what other rhythm control options we have? Noncompliance with CPAP not helping situation.   AKI on CKD stage 3a - Baseline SCr ~ 1.4-1.6 - Scr now up to 2.3 => 2.0. Preserved CO on cath and filling pressures optimized. No diuretic today. - avoid hypotension   Acute cystitis d/t Klebsiella pneumoniae - mgmt per primary team - Has completed abx.    OSA noncompliant with CPAP - Needs better compliance.    Morbid obesity - Body mass index is 40.2 kg/m.    Anticipate will need SNF for rehab at discharge.  Length of Stay: 12  FINCH, LINDSAY N, PA-C  02/02/2024, 10:00 AM  Advanced Heart Failure Team Pager [phone] (M-F; 7a - 5p)  Please contact [facility] for night-coverage after hours (5p -7a ) and weekends on [email or web address]   Patient seen with PA, I formulated the plan and agree with the above note.    CVP 8 today, he is back in AF with  controlled rate in 80s generally.  Feels ok.   General: NAD Neck: Thick, JVP difficult, no thyromegaly or thyroid  nodule.  Lungs: Clear to auscultation bilaterally with normal respiratory effort. CV: Nondisplaced PMI.  Heart irregular S1/S2, no S3/S4, no murmur.  Trace ankle edema.   Abdomen: Soft, nontender, no hepatosplenomegaly, no distention.  Skin: Intact  without lesions or rashes.  Neurologic: Alert and oriented x 3.  Psych: Normal affect. Extremities: No clubbing or cyanosis.  HEENT: Normal.   He has again failed DCCV.  I will stop IV amiodarone  since rate control is reasonable, start amiodarone  200 mg bid + Toprol  XL 25 mg daily for now for rate control.  If he does not come out of atrial fibrillation by the time he follows up as outpatient, would stop amiodarone  and increase Toprol  XL.   CVP 8, I will start torsemide 40 mg daily.  This will hopefully be his home dose.   He will need SNF.   Ezra Shuck 02/02/2024 12:15 PM

## 2024-02-02 NOTE — Assessment & Plan Note (Signed)
Continue local care

## 2024-02-02 NOTE — Assessment & Plan Note (Signed)
 Continue levothyroxine 

## 2024-02-02 NOTE — Assessment & Plan Note (Signed)
 Echocardiogram with preserved LV systolic function EF 55%, mild LVH, RV systolic function with mild reduction, RA with mild dilatation, small pericardial effusion,   Urine output 1000 ml Systolic blood pressure 120 mmHg range  Plan to continue metoprolol  succinate and torsemide

## 2024-02-02 NOTE — Assessment & Plan Note (Signed)
 Resolved

## 2024-02-03 DIAGNOSIS — N1832 Chronic kidney disease, stage 3b: Secondary | ICD-10-CM | POA: Diagnosis not present

## 2024-02-03 DIAGNOSIS — I480 Paroxysmal atrial fibrillation: Secondary | ICD-10-CM | POA: Diagnosis not present

## 2024-02-03 DIAGNOSIS — I10 Essential (primary) hypertension: Secondary | ICD-10-CM | POA: Diagnosis not present

## 2024-02-03 DIAGNOSIS — I5033 Acute on chronic diastolic (congestive) heart failure: Secondary | ICD-10-CM | POA: Diagnosis not present

## 2024-02-03 LAB — BLOOD GAS, VENOUS
Acid-Base Excess: 7.3 mmol/L — ABNORMAL HIGH (ref 0.0–2.0)
Bicarbonate: 33.6 mmol/L — ABNORMAL HIGH (ref 20.0–28.0)
Drawn by: 55063
O2 Saturation: 59.3 %
Patient temperature: 37
pCO2, Ven: 53 mmHg (ref 44–60)
pH, Ven: 7.41 (ref 7.25–7.43)
pO2, Ven: 33 mmHg (ref 32–45)

## 2024-02-03 LAB — BASIC METABOLIC PANEL WITH GFR
Anion gap: 9 (ref 5–15)
BUN: 47 mg/dL — ABNORMAL HIGH (ref 8–23)
CO2: 30 mmol/L (ref 22–32)
Calcium: 8.6 mg/dL — ABNORMAL LOW (ref 8.9–10.3)
Chloride: 98 mmol/L (ref 98–111)
Creatinine, Ser: 2.28 mg/dL — ABNORMAL HIGH (ref 0.61–1.24)
GFR, Estimated: 28 mL/min — ABNORMAL LOW (ref >60)
Glucose, Bld: 91 mg/dL (ref 70–99)
Potassium: 4.1 mmol/L (ref 3.5–5.1)
Sodium: 137 mmol/L (ref 135–145)

## 2024-02-03 LAB — COOXEMETRY PANEL
Carboxyhemoglobin: 1.5 % (ref 0.5–1.5)
Methemoglobin: <0.7 % (ref 0.0–1.5)
O2 Saturation: 62.2 %
Total hemoglobin: 11.5 g/dL — ABNORMAL LOW (ref 12.0–16.0)

## 2024-02-03 LAB — MAGNESIUM: Magnesium: 2.3 mg/dL (ref 1.7–2.4)

## 2024-02-03 MED ORDER — GABAPENTIN 300 MG PO CAPS: 300.0000 mg | ORAL_CAPSULE | Freq: Three times a day (TID) | ORAL | 0 refills | Status: DC

## 2024-02-03 MED ORDER — TORSEMIDE 40 MG PO TABS: 40.0000 mg | ORAL_TABLET | Freq: Every day | ORAL | 0 refills | Status: DC

## 2024-02-03 MED ORDER — METOPROLOL SUCCINATE ER 25 MG PO TB24: 25.0000 mg | ORAL_TABLET | Freq: Every day | ORAL | 0 refills | Status: DC

## 2024-02-03 MED ORDER — DULOXETINE HCL 60 MG PO CPEP: 60.0000 mg | ORAL_CAPSULE | Freq: Two times a day (BID) | ORAL | 0 refills | Status: DC

## 2024-02-03 MED ORDER — RIVAROXABAN 20 MG PO TABS: 20.0000 mg | ORAL_TABLET | Freq: Every day | ORAL | 0 refills | Status: DC

## 2024-02-03 MED ORDER — LACTULOSE 10 GM/15ML PO SOLN: 20.0000 g | Freq: Every day | ORAL | 1 refills | Status: DC | PRN

## 2024-02-03 MED ORDER — RIVAROXABAN 15 MG PO TABS: 15.0000 mg | ORAL_TABLET | Freq: Every day | ORAL | 3 refills | Status: DC

## 2024-02-03 MED ORDER — AMIODARONE HCL 200 MG PO TABS: ORAL_TABLET | ORAL | 0 refills | Status: DC

## 2024-02-03 MED ORDER — ACETAMINOPHEN 325 MG PO TABS: 650.0000 mg | ORAL_TABLET | ORAL | Status: DC | PRN

## 2024-02-03 NOTE — Progress Notes (Signed)
 Patient ID: Carlos Hanson, male   DOB: 24-Apr-1941, 83 y.o.   MRN: [id number]     Advanced Heart Failure Rounding Note  Cardiologist: Gordy Bergamo, MD  Consulting HF Cardiologist: Dr. Rolan  Chief Complaint: A/C HFpEF  Subjective:    9/30: RHC with optimized filling pressures and preserved CO. DCCV from Afib to SR. Back in AF several hours later.  Remains in AF this am, rate 80s-90s now on po amiodarone .    CO-OX 62% without inotrope support.  CVP 9-10 on torsemide. Creatinine 2.02 => 2.28.   Still intermittently confused, does better when his wife is present.    Objective:   Weight Range: 130.7 kg Body mass index is 40.2 kg/m.   Vital Signs:   Temp:  [97.1 F (36.2 C)-98.9 F (37.2 C)] 98.6 F (37 C) (10/02 0801) Pulse Rate:  [67-103] 76 (10/02 0801) Resp:  [17-20] 18 (10/02 0801) BP: (96-128)/(51-81) 109/77 (10/02 0801) SpO2:  [93 %-99 %] 99 % (10/02 0801) FiO2 (%):  [21 %] 21 % (10/01 2340) Last BM Date : 01/29/24  Weight change: Filed Weights   01/29/24 0421 01/29/24 0500  Weight: 130.7 kg 130.7 kg    Intake/Output:   Intake/Output Summary (Last 24 hours) at 02/03/2024 1153 Last data filed at 02/03/2024 0941 Gross per 24 hour  Intake 10 ml  Output 1050 ml  Net -1040 ml    Physical Exam   General: NAD, obese.  Neck: Thick, JVP difficult, no thyromegaly or thyroid  nodule.  Lungs: Clear to auscultation bilaterally with normal respiratory effort. CV: Nondisplaced PMI.  Heart irregular S1/S2, no S3/S4, no murmur.  No peripheral edema.   Abdomen: Soft, nontender, no hepatosplenomegaly, no distention.  Skin: Intact without lesions or rashes.  Neurologic: Intermittently confused Psych: Normal affect. Extremities: No clubbing or cyanosis.  HEENT: Normal.   Telemetry   Afib 80s-90s (personally reviewed)  Labs    CBC Recent Labs    02/01/24 0500 02/01/24 0936 02/01/24 0939  WBC 8.0  --   --   HGB 10.8* 11.6* 11.9*  HCT 33.8* 34.0* 35.0*  MCV 93.1   --   --   PLT 528*  --   --    Basic Metabolic Panel Recent Labs    89/98/74 0500 02/03/24 0640  NA 136 137  K 4.1 4.1  CL 95* 98  CO2 29 30  GLUCOSE 146* 91  BUN 48* 47*  CREATININE 2.02* 2.28*  CALCIUM  8.4* 8.6*  MG 2.4 2.3   Liver Function Tests No results for input(s): AST, ALT, ALKPHOS, BILITOT, PROT, ALBUMIN in the last 72 hours.  No results for input(s): LIPASE, AMYLASE in the last 72 hours. Cardiac Enzymes No results for input(s): CKTOTAL, CKMB, CKMBINDEX, TROPONINI in the last 72 hours.  BNP: BNP (last 3 results) Recent Labs    07/11/23 0653  BNP 125.3*    ProBNP (last 3 results) No results for input(s): PROBNP in the last 8760 hours.   D-Dimer No results for input(s): DDIMER in the last 72 hours. Hemoglobin A1C No results for input(s): HGBA1C in the last 72 hours. Fasting Lipid Panel No results for input(s): CHOL, HDL, LDLCALC, TRIG, CHOLHDL, LDLDIRECT in the last 72 hours. Thyroid  Function Tests No results for input(s): TSH, T4TOTAL, T3FREE, THYROIDAB in the last 72 hours.  Invalid input(s): FREET3  Other results:   Imaging    No results found.    Medications:     Scheduled Medications:  amiodarone   200 mg Oral BID  atorvastatin   20 mg Oral Daily   Chlorhexidine  Gluconate Cloth  6 each Topical Daily   donepezil   5 mg Oral QHS   DULoxetine   60 mg Oral BID   famotidine   20 mg Oral Daily   gabapentin   300 mg Oral TID   lactulose   20 g Oral Daily   levothyroxine   50 mcg Oral QAC breakfast   melatonin  10 mg Oral QHS   metoprolol  succinate  25 mg Oral Daily   multivitamin  1 tablet Oral Daily   Rivaroxaban   20 mg Oral Q supper   senna-docusate  1 tablet Oral QHS   sodium chloride  flush  10-40 mL Intracatheter Q12H   torsemide  40 mg Oral Daily    Infusions:    PRN Medications: acetaminophen , diclofenac  Sodium, guaiFENesin-dextromethorphan, HYDROcodone -acetaminophen ,  ondansetron  (ZOFRAN ) IV, polyethylene glycol, sodium chloride  flush    Patient Profile   Carlos Hanson is a 83 y.o. male with chronic HFpEF, PAF on Xarelto , OA, venous stasis, CAD, GERD, HLD, OSA on CPAP and obesity. AHF team to see with A/C HFpEF.   Assessment/Plan   A/C HFpEF - Echo 9/25: EF 55%. Mild concentric LVH, RV mildly reduced, RA mildly dilated, trivial MR, mild dilatation of aortic root 39 mm. Mild dilatation of ascending aorta 39 mm. Small pericardial effusion present.  - NYHA IV on admission w/ marked volume overload.  - He has diuresed. Creatinine mildly higher today at 2.28. Co-ox 62%. CVP 9-10 today. RHC 09/30 with optimized filling pressures and preserved CO.  Continue torsemide 40 mg daily.  - No SGLT2i with recent UTI and mobility - Continue UNNA boots    A fib/Flu RVR - EP followed earlier this admit - Failed DCCV x 2 in the setting of volume overload - Continue Xarelto  - Currently not a candidate for AVJ + PPM with acute conditions.  - Cardioversion reattempted 09/30 after diuresis. Initially successful but back in AF with controlled rate several hours later.  - Continue amiodarone  200 mg bid x 10 days then 200 mg daily.  If he remains in AF at followup, will probably stop amiodarone .  - Continue Toprol  XL 25 daily.    AKI on CKD stage 3a - Baseline SCr ~ 1.4-1.6 - Scr now up to 2.3 => 2.0 => 2.28.  - avoid hypotension   Acute cystitis d/t Klebsiella pneumoniae - mgmt per primary team - Has completed abx.    OSA noncompliant with CPAP - Needs better compliance.    Morbid obesity - Body mass index is 40.2 kg/m.    Plan for discharge to SNF.  Cardiac meds for discharge: Xarelto  20 mg daily, amiodarone  200 mg bid x 10 days then 200 daily, Toprol  XL 25 mg daily, torsemide 40 daily, atorvastatin  20 daily.   Ezra Shuck 02/03/2024 11:53 AM

## 2024-02-03 NOTE — Discharge Summary (Addendum)
 Physician Discharge Summary   Patient: Carlos Hanson MRN: [id number] DOB: [birth year]  Admit date:     01/21/2024  Discharge date: 02/03/24  Discharge Physician: Elidia Sieving Isai Gottlieb   PCP: Yolande Sieving [name], MD   Recommendations at discharge:    Limited guideline directed medical therapy due to reduced GFR, patient placed on metoprolol  succinate and diuresis with torsemide.  Discontinue diltiazem .  Due to reduced GFR will keep dose of Rivaroxaban  at 15 mg  Continue amiodarone  oral load of 200 mg bid for 10 days until 10/12 then 02/14/24 start taking 200 mg daily.  Duloxetine  and gabapentin  dose was reduce to avoid encephalopathy.  Follow up renal function and electrolytes in 7 days as outpatient  Follow up with Dr Andra in 7 to 10 days Follow up with Cardiology as scheduled.   I spoke with patient's wife over the phone, we talked in detail about patient's condition, plan of care and prognosis and all questions were addressed.  Discharge Diagnoses: Principal Problem:   Acute on chronic diastolic heart failure (HCC) Active Problems:   Essential hypertension   Paroxysmal atrial fibrillation (HCC)   CKD stage 3b, GFR 30-44 ml/min (HCC)   Hyperlipidemia   Hypothyroidism   Chronic venous insufficiency of lower extremity   Chronic low back pain   Urinary tract infection with hematuria   OSA (obstructive sleep apnea)   Pressure injury of skin   Obesity, class 3 (HCC)   Dementia (HCC)  Resolved Problems:   * No resolved hospital problems. *  Hospital Course: BING DUFFEY was admitted to the hospital with the working diagnosis of heart failure exacerbation.   83 y.o. male with PMH of paroxysmal A-fib, osteoarthritis, venous stasis insufficiency, chronic back pain, CAD, GERD, hiatal hernia, gout, history of bleeding PUD, hyperlipidemia, obstructive sleep apnea on CPAP, class II obesity and chronic constipation who presented with worsening edema.   Reported worsening lower  extremity edema for the last 3 weeks prior to admission. His was seen by primary care 2 prior, prescribed furosemide  and advised to continue leg elevation and compression.  Unfortunately he had no improvement in his symptoms.   On his initial physical examination HR initially in the 50s to 70s, SBP 110-130s, SpO2 96% on room air.  Lungs with no wheezing or rhonchi, heart with S1 and S2 present, irregularly irregular with no gallops or rubs, abdomen with no distention and positive lower extremity edema ++.   Na 142, K 5.1 Cl 105 bicarbonate 25 glucose 118 bun 36 cr 1,57  Wbc 12,5 hgb 11,0 plt 355  Urine analysis SG 1,010, negative protein, negative nitrates, negative hgb, moderate leukocytes.  Wbc 11-20   Lower extremity doppler ultrasound with no evidence of deep vein thrombosis in the left lower extremity.  Subcutaneous edema in the left calf.   Chest radiograph with cardiomegaly, bilateral hilar vascular congestion, bilateral central interstitial infiltrates, no effusions.   EKG 120 bpm, left axis deviation, right bundle branch block, qtc 489, atrial fibrillation rhythm with PVC, no significant ST segment or T wave changes.   Subsequently went into A-fib with RVR.,  Started on iV Cardizem  and due to hypotension, changed to IV amiodarone .   9/22: Underwent cardioversion x 3 without success 9/24: DCCV again unsuccessful 09/30 direct current cardioversion, briefly successful, then back in atrial fibrillation.  10/02 patient failure direct current cardioversion, rate controlled was achieved. Plan to continue close follow up as outpatient.   Assessment and Plan: * Acute on chronic diastolic heart  failure (HCC) Echocardiogram with preserved LV systolic function EF 55%, mild LVH, RV systolic function with mild reduction, RA with mild dilatation, small pericardial effusion,   09/30 cardiac catheterization  RA 6 RV 27/10  PA 29/20 mean 20  PCWP 12  Cardiac output 5.58 and index 2.26 (Fick)    Patient was placed on IV furosemide  for diuresis, negative fluid balance was achieved, - 16,285 ml, with significant improvement in his symptoms.   Plan to continue metoprolol  succinate and torsemide.   Limited medical therapy due to reduced GFR.  No SGLT2 inh due to recent urinary tract infection.   Essential hypertension Continue blood pressure control with metoprolol .   Paroxysmal atrial fibrillation (HCC) Patient failed direct current cardioversion  Plan to continue amiodarone  oral load and metoprolol  Anticoagulation with rivaroxaban .   CKD stage 3b, GFR 30-44 ml/min (HCC) AKI. Hyponatremia and hypokalemia   Volume status has improved, at the time of his discharge his serum cr is 2.28 with K at 4.1 and serum bicarbonate at 30  Na 137 and Mg 2.3   Plan to continue diuresis with torsemide and follow up renal function and electrolytes in 7 days as outpatient.   Hyperlipidemia Continue statin therapy   Hypothyroidism Continue levothyroxine    Chronic venous insufficiency of lower extremity Continue leg elevation   Chronic low back pain Continue pain control with gabapentin  and duloxetine .   Urinary tract infection with hematuria Resolved   OSA (obstructive sleep apnea) cpap  Pressure injury of skin Continue local care  Dementia (HCC) Acute metabolic encephalopathy, with delirium. Patient was placed on supportive medical therapy  Continue duloxetine , donepezil  and melatonin.   At the time of discharge his encephalopathy has resolved.    Obesity, class 3 (HCC) Calculated BMI is 40.2      Consultants: cardiology  Procedures performed: direct current cardioversion, cardiac catheterization  Disposition: Skilled nursing facility Diet recommendation:  Cardiac diet DISCHARGE MEDICATION: Allergies as of 02/03/2024       Reactions   Celecoxib Other (See Comments)   Ineffective   Penicillin G Sodium Nausea Only   Naprosyn [naproxen] Rash         Medication List     STOP taking these medications    diltiazem  120 MG 24 hr capsule Commonly known as: CARDIZEM  CD       TAKE these medications    acetaminophen  325 MG tablet Commonly known as: TYLENOL  Take 2 tablets (650 mg total) by mouth every 4 (four) hours as needed for headache or mild pain (pain score 1-3). What changed:  medication strength how much to take when to take this reasons to take this   amiodarone  200 MG tablet Commonly known as: PACERONE  Take 200 mg twice daily for 10 days, until 10/12 then on 10/13 start taking 200 mg daily. What changed:  how much to take how to take this when to take this additional instructions   atorvastatin  20 MG tablet Commonly known as: LIPITOR Take 1 tablet by mouth daily.   clobetasol 0.05 % external solution Commonly known as: TEMOVATE Apply 1 Application topically daily as needed (psoriasis).   donepezil  5 MG tablet Commonly known as: ARICEPT  Take 5 mg by mouth at bedtime.   DULoxetine  60 MG capsule Commonly known as: CYMBALTA  Take 1 capsule (60 mg total) by mouth 2 (two) times daily. What changed: when to take this   famotidine  20 MG tablet Commonly known as: PEPCID  Take 20 mg by mouth daily.   gabapentin  300 MG capsule  Commonly known as: NEURONTIN  Take 1 capsule (300 mg total) by mouth 3 (three) times daily. What changed: how much to take   ketoconazole 2 % cream Commonly known as: NIZORAL Apply 1 Application topically daily as needed for irritation.   lactulose  10 GM/15ML solution Commonly known as: CHRONULAC  Take 30 mLs (20 g total) by mouth daily as needed for mild constipation. What changed:  when to take this reasons to take this   levothyroxine  50 MCG tablet Commonly known as: SYNTHROID  Take 50 mcg by mouth daily before breakfast.   MELATONIN PO Take 10 mg by mouth at bedtime.   metoprolol  succinate 25 MG 24 hr tablet Commonly known as: TOPROL -XL Take 1 tablet (25 mg total) by mouth  daily. Start taking on: February 04, 2024   oxymetazoline  0.05 % nasal spray Commonly known as: AFRIN Place 1 spray into both nostrils at bedtime as needed for congestion (sinuses).   Rivaroxaban  15 MG Tabs tablet Commonly known as: Xarelto  Take 1 tablet (15 mg total) by mouth daily with supper.   Senexon-S 8.6-50 MG tablet Generic drug: senna-docusate Take 1 tablet by mouth at bedtime.   Torsemide 40 MG Tabs Take 40 mg by mouth daily. Start taking on: February 04, 2024   Vision Formula [employer] Health Caps Take 1 tablet by mouth 2 (two) times daily. [employer]        Discharge Exam: Filed Weights   01/29/24 0421 01/29/24 0500  Weight: 130.7 kg 130.7 kg   BP 109/77 (BP Location: Left Leg)   Pulse 76   Temp 98.6 F (37 C) (Oral)   Resp 18   Ht 5' 11 (1.803 m)   Wt 130.7 kg   SpO2 99%   BMI 40.20 kg/m   Patient is feeling well, no dyspnea and no chest pain, continue very weak and deconditioned. He had intermittent confusion, but improving. At the time of my examination able to respond to questions appropriately   Neurology awake and alert, deconditioned ENT with mild pallor with no icterus Cardiovascular with S1 and S2 present, irregularly irregular with no gallops or rubs, positive systolic murmur at the apex Respiratory with poor inspiratory effort with no wheezing, rales or rhonchi  Abdomen protuberant, but not distended and not tender No lower extremity edema, unna boots in place   Condition at discharge: stable  The results of significant diagnostics from this hospitalization (including imaging, microbiology, ancillary and laboratory) are listed below for reference.   Imaging Studies: CARDIAC CATHETERIZATION Result Date: 02/01/2024 Filling pressures look optimized at this point. PA pressure not elevated, preserved CO.   EP STUDY Result Date: 02/01/2024 See surgical note for result.  EP STUDY Result Date: 01/26/2024 See surgical note for result.  DG Chest  Port 1 View Result Date: 01/25/2024 CLINICAL DATA:  PICC line placement EXAM: PORTABLE CHEST 1 VIEW COMPARISON:  None Available. FINDINGS: PICC line placed with tip in the proximal SVC. Normal cardiac silhouette. No effusion, infiltrate or pneumothorax. IMPRESSION: PICC line with tip in the proximal SVC. Electronically Signed   By: Jackquline Boxer M.D.   On: 01/25/2024 14:50   DG Wrist 2 Views Right Result Date: 01/25/2024 CLINICAL DATA:  Pain EXAM: RIGHT WRIST - 2 VIEW COMPARISON:  None Available. FINDINGS: No distal radius or ulnar fracture. Radiocarpal joint is intact. No carpal fracture. No soft tissue abnormality. Mild joint space narrowing of the radiocarpal joint and first carpal metacarpal joint. IMPRESSION: No fracture or dislocation.  Mild degenerative change Electronically Signed  By: Jackquline Boxer M.D.   On: 01/25/2024 11:28   US  EKG SITE RITE Result Date: 01/25/2024 If Site Rite image not attached, placement could not be confirmed due to current cardiac rhythm.  ECHOCARDIOGRAM COMPLETE Result Date: 01/24/2024    ECHOCARDIOGRAM REPORT   Patient Name:   RAYNOR CALCATERRA Date of Exam: 01/24/2024 Medical Rec #:  [id number]    Height:       71.0 in Accession #:    [id number]   Weight:       304.5 lb Date of Birth:  January 03, 1941     BSA:          2.521 m Patient Age:    83 years     BP:           141/120 mmHg Patient Gender: M            HR:           125 bpm. Exam Location:  Inpatient Procedure: 2D Echo, Pediatric Echo, Color Doppler and Intracardiac Opacification            Agent (Both Spectral and Color Flow Doppler were utilized during            procedure). Indications:    Atrial fibrillation I48.91  History:        Patient has no prior history of Echocardiogram examinations.                 CAD, Arrythmias:Atrial Fibrillation; Risk Factors:Sleep Apnea.                 Hyperlipidemia.  Sonographer:    BERNARDA ROCKS Referring Phys: [id number] PROSPER M AMPONSAH IMPRESSIONS  1. Left ventricular ejection  fraction, by estimation, is 55%. The left ventricle has normal function. Left ventricular endocardial border not optimally defined to evaluate regional wall motion. There is mild concentric left ventricular hypertrophy. Left  ventricular diastolic parameters are indeterminate.  2. Right ventricular systolic function is mildly reduced. The right ventricular size is normal. There is normal pulmonary artery systolic pressure.  3. Right atrial size was mildly dilated.  4. The mitral valve is normal in structure. Trivial mitral valve regurgitation. No evidence of mitral stenosis.  5. The aortic valve is tricuspid. There is moderate calcification of the aortic valve. Aortic valve regurgitation is not visualized. No aortic stenosis is present.  6. Aortic dilatation noted. There is mild dilatation of the aortic root, measuring 39 mm. There is mild dilatation of the ascending aorta, measuring 39 mm.  7. A small pericardial effusion is present. The pericardial effusion is circumferential.  8. The patient was in atrial fibrillation.  9. Technically difficult study with poor acoustic windows. LV function appears grossly normal. FINDINGS  Left Ventricle: Left ventricular ejection fraction, by estimation, is 55%. The left ventricle has normal function. Left ventricular endocardial border not optimally defined to evaluate regional wall motion. Definity  contrast agent was given IV to delineate the left ventricular endocardial borders. The left ventricular internal cavity size was normal in size. There is mild concentric left ventricular hypertrophy. Left ventricular diastolic parameters are indeterminate. Right Ventricle: The right ventricular size is normal. Right vetricular wall thickness was not well visualized. Right ventricular systolic function is mildly reduced. There is normal pulmonary artery systolic pressure. The tricuspid regurgitant velocity is 2.74 m/s, and with an assumed right atrial pressure of 3 mmHg, the  estimated right ventricular systolic pressure is 33.0 mmHg. Left Atrium: Left atrial size was normal in size.  Right Atrium: Right atrial size was mildly dilated. Pericardium: A small pericardial effusion is present. The pericardial effusion is circumferential. Mitral Valve: The mitral valve is normal in structure. Mild to moderate mitral annular calcification. Trivial mitral valve regurgitation. No evidence of mitral valve stenosis. MV peak gradient, 7.0 mmHg. The mean mitral valve gradient is 3.0 mmHg. Tricuspid Valve: The tricuspid valve is normal in structure. Tricuspid valve regurgitation is trivial. Aortic Valve: The aortic valve is tricuspid. There is moderate calcification of the aortic valve. Aortic valve regurgitation is not visualized. No aortic stenosis is present. Aortic valve mean gradient measures 3.0 mmHg. Aortic valve peak gradient measures 5.7 mmHg. Aortic valve area, by VTI measures 4.61 cm. Pulmonic Valve: The pulmonic valve was not well visualized. Pulmonic valve regurgitation is not visualized. Aorta: Aortic dilatation noted. There is mild dilatation of the aortic root, measuring 39 mm. There is mild dilatation of the ascending aorta, measuring 39 mm. IAS/Shunts: The interatrial septum was not well visualized.  LEFT VENTRICLE PLAX 2D LVIDd:         4.00 cm LVIDs:         2.50 cm LV PW:         1.00 cm LV IVS:        1.00 cm LVOT diam:     2.10 cm LV SV:         68 LV SV Index:   27 LVOT Area:     3.46 cm  LV Volumes (MOD) LV vol d, MOD A2C: 87.2 ml LV vol d, MOD A4C: 115.0 ml LV vol s, MOD A2C: 30.7 ml LV vol s, MOD A4C: 39.2 ml LV SV MOD A2C:     56.5 ml LV SV MOD A4C:     115.0 ml LV SV MOD BP:      65.6 ml RIGHT VENTRICLE            IVC RV Basal diam:  4.30 cm    IVC diam: 1.50 cm RV S prime:     9.46 cm/s TAPSE (M-mode): 1.3 cm LEFT ATRIUM             Index        RIGHT ATRIUM           Index LA diam:        4.20 cm 1.67 cm/m   RA Area:     20.80 cm LA Vol (A2C):   52.8 ml 20.95 ml/m   RA Volume:   57.50 ml  22.81 ml/m LA Vol (A4C):   71.1 ml 28.20 ml/m LA Biplane Vol: 65.4 ml 25.94 ml/m  AORTIC VALVE                    PULMONIC VALVE AV Area (Vmax):    3.61 cm     PV Vmax:       1.13 m/s AV Area (Vmean):   3.47 cm     PV Peak grad:  5.1 mmHg AV Area (VTI):     4.61 cm AV Vmax:           119.00 cm/s AV Vmean:          77.700 cm/s AV VTI:            0.148 m AV Peak Grad:      5.7 mmHg AV Mean Grad:      3.0 mmHg LVOT Vmax:         124.00 cm/s LVOT Vmean:  77.900 cm/s LVOT VTI:          0.197 m LVOT/AV VTI ratio: 1.33  AORTA Ao Root diam: 3.90 cm Ao Asc diam:  3.90 cm MITRAL VALVE                TRICUSPID VALVE MV Area (PHT): 9.03 cm     TR Peak grad:   30.0 mmHg MV Area VTI:   2.76 cm     TR Vmax:        274.00 cm/s MV Peak grad:  7.0 mmHg MV Mean grad:  3.0 mmHg     SHUNTS MV Vmax:       1.32 m/s     Systemic VTI:  0.20 m MV Vmean:      75.4 cm/s    Systemic Diam: 2.10 cm MV Decel Time: 84 msec MV E velocity: 102.00 cm/s MV A velocity: 70.10 cm/s MV E/A ratio:  1.46 Dalton McleanMD Electronically signed by Ezra Kanner Signature Date/Time: 01/24/2024/5:26:02 PM    Final    EP STUDY Result Date: 01/24/2024 See surgical note for result.  DG Chest Port 1 View Result Date: 01/24/2024 EXAM: 1 VIEW XRAY OF THE CHEST 01/24/2024 09:38:00 AM COMPARISON: 01/21/2024 CLINICAL HISTORY: Fever FINDINGS: LUNGS AND PLEURA: Slightly low lung volumes. Increased density at the left lung base is favored to be due to overlying soft tissues. Cannot exclude developing airspace disease. No pleural effusion. No pneumothorax. HEART AND MEDIASTINUM: Similar enlarged cardiomediastinal silhouette. BONES AND SOFT TISSUES: Partially imaged left shoulder arthroplasty. Multiple wires and leads project over the chest on the frontal radiograph. IMPRESSION: 1. Low lung volumes. Likely artifactual increased density over the left lung base secondary to overlying soft tissues. Cannot exclude developing left lower  lobe airspace disease. Consider PA and lateral radiographs. 2. Cardiomegaly without congestive heart failure. Electronically signed by: Rockey Kilts MD 01/24/2024 10:02 AM EDT RP Workstation: [id number]   DG Chest Port 1 View Result Date: 01/21/2024 CLINICAL DATA:  Atrial fibrillation with rapid ventricular response, left leg swelling EXAM: PORTABLE CHEST 1 VIEW COMPARISON:  07/11/2023 FINDINGS: Single frontal view of the chest demonstrates stable enlargement of the cardiac silhouette. No acute airspace disease, effusion, or pneumothorax. No acute bony abnormalities. IMPRESSION: 1. No acute intrathoracic process. Electronically Signed   By: Ozell Daring M.D.   On: 01/21/2024 17:13   US  Venous Img Lower  Left (DVT Study) Result Date: 01/21/2024 EXAM: ULTRASOUND DUPLEX OF THE LEFT LOWER EXTREMITY VEINS TECHNIQUE: Duplex ultrasound using B-mode/gray scaled imaging and Doppler spectral analysis and color flow was obtained of the deep venous structures of the left lower extremity. COMPARISON: None. CLINICAL HISTORY: LLE swelling. FINDINGS: The common femoral vein, femoral vein, popliteal vein, and posterior tibial vein of the left lower extremity demonstrate normal compressibility with normal color flow and spectral analysis. Limited images of the contralateral right common femoral vein are negative. There is subcutaneous edema in the left calf. IMPRESSION: 1. No evidence of deep venous thrombosis in the left lower extremity. 2. Subcutaneous edema in the left calf. Electronically signed by: Dayne Hassell MD 01/21/2024 01:08 PM EDT RP Workstation: [id number]    Microbiology: Results for orders placed or performed during the hospital encounter of 01/21/24  Urine Culture     Status: Abnormal   Collection Time: 01/21/24  4:18 PM   Specimen: Urine, Clean Catch  Result Value Ref Range Status   Specimen Description   Final    URINE, CLEAN CATCH Performed at [facility],  [address], [place], [name] [address]    Special Requests   Final    NONE Performed at [facility], [address] [place], [name] [address]    Culture >=100,000 COLONIES/mL KLEBSIELLA PNEUMONIAE (A)  Final   Report Status 01/23/2024 FINAL  Final   Organism ID, Bacteria KLEBSIELLA PNEUMONIAE (A)  Final      Susceptibility   Klebsiella pneumoniae - MIC*    AMPICILLIN RESISTANT Resistant     CEFAZOLIN  (URINE) Value in next row Sensitive      <=1 SENSITIVEThis is a modified FDA-approved test that has been validated and its performance characteristics determined by the reporting laboratory.  This laboratory is certified under the Clinical Laboratory Improvement Amendments CLIA as qualified to perform high complexity clinical laboratory testing.    CEFEPIME  Value in next row Sensitive      <=1 SENSITIVEThis is a modified FDA-approved test that has been validated and its performance characteristics determined by the reporting laboratory.  This laboratory is certified under the Clinical Laboratory Improvement Amendments CLIA as qualified to perform high complexity clinical laboratory testing.    ERTAPENEM Value in next row Sensitive      <=1 SENSITIVEThis is a modified FDA-approved test that has been validated and its performance characteristics determined by the reporting laboratory.  This laboratory is certified under the Clinical Laboratory Improvement Amendments CLIA as qualified to perform high complexity clinical laboratory testing.    CEFTRIAXONE  Value in next row Sensitive      <=1 SENSITIVEThis is a modified FDA-approved test that has been validated and its performance characteristics determined by the reporting laboratory.  This laboratory is certified under the Clinical Laboratory Improvement Amendments CLIA as qualified to perform high complexity clinical laboratory testing.    CIPROFLOXACIN Value in next row Sensitive      <=1 SENSITIVEThis is a modified FDA-approved test  that has been validated and its performance characteristics determined by the reporting laboratory.  This laboratory is certified under the Clinical Laboratory Improvement Amendments CLIA as qualified to perform high complexity clinical laboratory testing.    GENTAMICIN Value in next row Sensitive      <=1 SENSITIVEThis is a modified FDA-approved test that has been validated and its performance characteristics determined by the reporting laboratory.  This laboratory is certified under the Clinical Laboratory Improvement Amendments CLIA as qualified to perform high complexity clinical laboratory testing.    NITROFURANTOIN Value in next row Sensitive      <=1 SENSITIVEThis is a modified FDA-approved test that has been validated and its performance characteristics determined by the reporting laboratory.  This laboratory is certified under the Clinical Laboratory Improvement Amendments CLIA as qualified to perform high complexity clinical laboratory testing.    TRIMETH /SULFA  Value in next row Sensitive      <=1 SENSITIVEThis is a modified FDA-approved test that has been validated and its performance characteristics determined by the reporting laboratory.  This laboratory is certified under the Clinical Laboratory Improvement Amendments CLIA as qualified to perform high complexity clinical laboratory testing.    AMPICILLIN/SULBACTAM Value in next row Sensitive      <=1 SENSITIVEThis is a modified FDA-approved test that has been validated and its performance characteristics determined by the reporting laboratory.  This laboratory is certified under the Clinical Laboratory Improvement Amendments CLIA as qualified to perform high complexity clinical laboratory testing.    PIP/TAZO Value in next row Sensitive      <=4 SENSITIVEThis is a modified FDA-approved test that has been  validated and its performance characteristics determined by the reporting laboratory.  This laboratory is certified under the Clinical  Laboratory Improvement Amendments CLIA as qualified to perform high complexity clinical laboratory testing.    MEROPENEM Value in next row Sensitive      <=4 SENSITIVEThis is a modified FDA-approved test that has been validated and its performance characteristics determined by the reporting laboratory.  This laboratory is certified under the Clinical Laboratory Improvement Amendments CLIA as qualified to perform high complexity clinical laboratory testing.    * >=100,000 COLONIES/mL KLEBSIELLA PNEUMONIAE  Culture, blood (Routine X 2) w Reflex to ID Panel     Status: None   Collection Time: 01/24/24  9:39 AM   Specimen: BLOOD RIGHT ARM  Result Value Ref Range Status   Specimen Description BLOOD RIGHT ARM  Final   Special Requests   Final    BOTTLES DRAWN AEROBIC AND ANAEROBIC Blood Culture results may not be optimal due to an inadequate volume of blood received in culture bottles   Culture   Final    NO GROWTH 5 DAYS Performed at [facility], [address] [place], [name] [address]    Report Status 01/29/2024 FINAL  Final  Culture, blood (Routine X 2) w Reflex to ID Panel     Status: None   Collection Time: 01/24/24  9:39 AM   Specimen: BLOOD RIGHT ARM  Result Value Ref Range Status   Specimen Description BLOOD RIGHT ARM  Final   Special Requests   Final    BOTTLES DRAWN AEROBIC AND ANAEROBIC Blood Culture adequate volume   Culture   Final    NO GROWTH 5 DAYS Performed at [facility], [address] [place], [name] [address]    Report Status 01/29/2024 FINAL  Final    Labs: CBC: Recent Labs  Lab 01/29/24 0425 02/01/24 0500 02/01/24 0936 02/01/24 0939  WBC 7.9 8.0  --   --   HGB 10.3* 10.8* 11.6* 11.9*  HCT 32.1* 33.8* 34.0* 35.0*  MCV 93.0 93.1  --   --   PLT 390 528*  --   --    Basic Metabolic Panel: Recent Labs  Lab 01/29/24 0425 01/30/24 0238 01/31/24 0550 02/01/24 0500 02/01/24 0936 02/01/24 0939 02/02/24 0500 02/03/24 0640  NA 133* 135  137 137 140 138 136 137  K 3.1* 3.9 3.6 3.8 3.6 3.8 4.1 4.1  CL 88* 92* 94* 97*  --   --  95* 98  CO2 30 31 31  32  --   --  29 30  GLUCOSE 181* 141* 107* 97  --   --  146* 91  BUN 57* 61* 58* 51*  --   --  48* 47*  CREATININE 2.26* 2.36* 2.32* 2.13*  --   --  2.02* 2.28*  CALCIUM  8.1* 8.3* 8.4* 8.5*  --   --  8.4* 8.6*  MG 2.2  --   --  2.3  --   --  2.4 2.3   Liver Function Tests: Recent Labs  Lab 01/29/24 0425  AST 60*  ALT 55*  ALKPHOS 58  BILITOT 0.5  PROT 5.4*  ALBUMIN 1.6*   CBG: No results for input(s): GLUCAP in the last 168 hours.  Discharge time spent: greater than 30 minutes.  Signed: Elidia Toribio Furnace, MD [facility] 02/03/2024

## 2024-02-03 NOTE — Progress Notes (Signed)
 PICC removed per protocol per MD order. Manual pressure applied for 5 mins. Vaseline gauze, gauze, and Tegaderm applied over insertion site. No bleeding or swelling noted. Instructed patient to remain in bed for thirty mins. Educated patient about S/S of infection and when to call MD; no heavy lifting or pressure on L side for 24 hours; keep dressing dry and intact for 24 hours. Pt verbalized comprehension.

## 2024-02-03 NOTE — TOC Transition Note (Addendum)
 Transition of Care [facility]) - Discharge Note   Patient Details  Name: Carlos Hanson MRN: [id number] Date of Birth: 02-10-1941  Transition of Care [facility]) CM/SW Contact:  Arlana JINNY Nicholaus ISRAEL Phone Number: [phone] 02/03/2024, 12:45 PM   Clinical Narrative:   12:39 pm-  HF CSW called [facility]. CSW placed dc paperwork on chart. CSW notified the bedside RN via secure chat.   12:40 PM- HF CSW called and updated the patients wife that [facility] was called and estimated travel arrival town.   Patient will be [facility]. Room #35, number to call for report [phone]. Bedside RN notified via secure chat.      Barriers to Discharge: Continued Medical Work up   Patient Goals and CMS Choice Patient states their goals for this hospitalization and ongoing recovery are:: to get stronger and be able to return home   Choice offered to / list presented to : Patient, Spouse, Adult Children      Discharge Placement                       Discharge Plan and Services Additional resources added to the After Visit Summary for     Discharge Planning Services: CM Consult Post Acute Care Choice: Home Health                               Social Drivers of Health (SDOH) Interventions SDOH Screenings   Food Insecurity: No Food Insecurity (01/21/2024)  Housing: Low Risk  (01/21/2024)  Transportation Needs: No Transportation Needs (01/21/2024)  Utilities: Not At Risk (01/21/2024)  Social Connections: Moderately Isolated (01/21/2024)  Tobacco Use: Medium Risk (01/26/2024)     Readmission Risk Interventions     No data to display

## 2024-02-03 NOTE — NC FL2 (Signed)
 [place]  MEDICAID FL2 LEVEL OF CARE FORM     IDENTIFICATION  Patient Name: Carlos Hanson Birthdate: 24-Jul-1940 Sex: male Admission Date (Current Location): 01/21/2024  [facility] and [place] Number:  Producer, television/film/video and Address:  [facility], [address], [place], [name] [address]      Provider Number: [id number]  Attending Physician Name and Address:  Noralee Elidia Sieving,*  Relative Name and Phone Number:  Anders Hohmann (Spouse) [phone]    Current Level of Care: Hospital Recommended Level of Care: Skilled Nursing Facility Prior Approval Number:    Date Approved/Denied:   PASRR Number: [id number]  Discharge Plan: SNF    Current Diagnoses: Patient Active Problem List   Diagnosis Date Noted   Pressure injury of skin 02/02/2024   Obesity, class 3 (HCC) 02/02/2024   Dementia (HCC) 02/02/2024   Acute on chronic diastolic heart failure (HCC) 01/26/2024   Acute on chronic diastolic CHF (congestive heart failure) (HCC) 01/25/2024   CKD stage 3b, GFR 30-44 ml/min (HCC) 01/25/2024   Atrial fibrillation with rapid ventricular response (HCC) 01/21/2024   Bilateral lower extremity edema 01/21/2024   Chronic venous insufficiency of lower extremity 01/21/2024   Chronic low back pain 01/21/2024   Acute respiratory failure with hypoxia (HCC) 07/12/2023   Sepsis due to undetermined organism (HCC) 07/11/2023   Multifocal pneumonia 07/11/2023   Acute respiratory insufficiency 07/11/2023   Hypothyroidism 11/03/2018   Essential hypertension 07/14/2018   Dysphagia 02/22/2017   Major depression, single episode 08/10/2016   Fall 05/01/2016   Morbid obesity due to excess calories (HCC) 05/16/2015   Chronic pain syndrome 05/16/2015   OSA (obstructive sleep apnea) 05/16/2015   Cataract 01/23/2015   RLS (restless legs syndrome) 07/09/2014   Paroxysmal atrial fibrillation (HCC) 07/09/2014   Class 3 obesity with alveolar hypoventilation, serious  comorbidity, and body mass index (BMI) of 40.0 to 44.9 in adult (HCC) 07/09/2014   OSA treated with BiPAP 07/09/2014   Urinary tract infection with hematuria 06/29/2014   Obstructive sleep apnea hypopnea, severe 06/29/2014   Hyperlipidemia 04/12/2009   Gastro-esophageal reflux disease without esophagitis 04/12/2009    Orientation RESPIRATION BLADDER Height & Weight     Self, Time, Situation, Place  Normal Incontinent, External catheter Weight:  (bed scale does not work.) Height:  5' 11 (180.3 cm)  BEHAVIORAL SYMPTOMS/MOOD NEUROLOGICAL BOWEL NUTRITION STATUS      Incontinent Diet (please see discahrge summary)  AMBULATORY STATUS COMMUNICATION OF NEEDS Skin   Extensive Assist Verbally Other (Comment) (skin tear on right lower arm, right posterier knee pressure injury)                       Personal Care Assistance Level of Assistance  Bathing, Feeding, Dressing, Total care Bathing Assistance: Maximum assistance Feeding assistance: Limited assistance Dressing Assistance: Maximum assistance Total Care Assistance: Maximum assistance   Functional Limitations Info  Hearing, Sight Sight Info: Impaired Financial trader) Hearing Info: Impaired (Hearing Aid)      SPECIAL CARE FACTORS FREQUENCY  PT (By licensed PT), OT (By licensed OT)     PT Frequency: 5x a week OT Frequency: 5x a week            Contractures Contractures Info: Not present    Additional Factors Info  Code Status, Allergies, Psychotropic Code Status Info: FULL Allergies Info: Celecoxib, Penicillin G Sodium, Naprosyn (Naproxen) Psychotropic Info: donepezil  (ARICEPT ) tablet 5 mg,DULoxetine  (CYMBALTA ) DR capsule 60 mg  Current Medications (02/03/2024):  This is the current hospital active medication list Current Facility-Administered Medications  Medication Dose Route Frequency Provider Last Rate Last Admin   acetaminophen  (TYLENOL ) tablet 650 mg  650 mg Oral Q4H PRN Amponsah, Prosper M, MD   650 mg  at 01/28/24 1227   amiodarone  (PACERONE ) tablet 200 mg  200 mg Oral BID McLean, Dalton S, MD   200 mg at 02/03/24 9065   atorvastatin  (LIPITOR) tablet 20 mg  20 mg Oral Daily Amponsah, Prosper M, MD   20 mg at 02/03/24 0935   Chlorhexidine  Gluconate Cloth 2 % PADS 6 each  6 each Topical Daily Christobal Guadalajara, MD   6 each at 02/03/24 0851   diclofenac  Sodium (VOLTAREN ) 1 % topical gel 2 g  2 g Topical TID PRN Christobal Guadalajara, MD   2 g at 01/28/24 2126   donepezil  (ARICEPT ) tablet 5 mg  5 mg Oral QHS Amponsah, Prosper M, MD   5 mg at 02/02/24 2107   DULoxetine  (CYMBALTA ) DR capsule 60 mg  60 mg Oral BID Amponsah, Prosper M, MD   60 mg at 02/03/24 9065   famotidine  (PEPCID ) tablet 20 mg  20 mg Oral Daily Amponsah, Prosper M, MD   20 mg at 02/03/24 0935   gabapentin  (NEURONTIN ) capsule 300 mg  300 mg Oral TID Danton Reyes DASEN, MD   300 mg at 02/03/24 0935   guaiFENesin-dextromethorphan (ROBITUSSIN DM) 100-10 MG/5ML syrup 15 mL  15 mL Oral Q4H PRN Danton Reyes DASEN, MD   15 mL at 01/29/24 2012   lactulose  (CHRONULAC ) 10 GM/15ML solution 20 g  20 g Oral Daily Amponsah, Prosper M, MD   20 g at 02/03/24 0934   levothyroxine  (SYNTHROID ) tablet 50 mcg  50 mcg Oral QAC breakfast Amponsah, Prosper M, MD   50 mcg at 02/03/24 0935   melatonin tablet 10 mg  10 mg Oral QHS Amponsah, Prosper M, MD   10 mg at 02/02/24 2106   metoprolol  succinate (TOPROL -XL) 24 hr tablet 25 mg  25 mg Oral Daily McLean, Dalton S, MD   25 mg at 02/03/24 0934   multivitamin (PROSIGHT) tablet 1 tablet  1 tablet Oral Daily Lou Claretta HERO, MD   1 tablet at 02/03/24 9065   ondansetron  (ZOFRAN ) injection 4 mg  4 mg Intravenous Q6H PRN Amponsah, Prosper M, MD       polyethylene glycol (MIRALAX  / GLYCOLAX ) packet 17 g  17 g Oral Daily PRN Kc, Ramesh, MD       rivaroxaban  (XARELTO ) tablet 20 mg  20 mg Oral Q supper Shlomo Corning R, MD   20 mg at 02/02/24 1644   senna-docusate (Senokot-S) tablet 1 tablet  1 tablet Oral QHS Lou Claretta HERO, MD    1 tablet at 02/02/24 2105   sodium chloride  flush (NS) 0.9 % injection 10-40 mL  10-40 mL Intracatheter Q12H Kc, Ramesh, MD   10 mL at 02/03/24 0941   sodium chloride  flush (NS) 0.9 % injection 10-40 mL  10-40 mL Intracatheter PRN Christobal Guadalajara, MD       torsemide (DEMADEX) tablet 40 mg  40 mg Oral Daily McLean, Dalton S, MD   40 mg at 02/03/24 9065     Discharge Medications: Please see discharge summary for a list of discharge medications.  Relevant Imaging Results:  Relevant Lab Results:   Additional Information SSN: [id number]  Arlana JINNY Moats, LCSWA

## 2024-02-03 NOTE — Care Management Important Message (Signed)
 Important Message  Patient Details  Name: TAKERU BOSE MRN: [id number] Date of Birth: April 04, 1941   Important Message Given:  Yes - Medicare IM     Vonzell Arrie Sharps 02/03/2024, 10:37 AM

## 2024-02-04 DIAGNOSIS — K219 Gastro-esophageal reflux disease without esophagitis: Secondary | ICD-10-CM | POA: Diagnosis not present

## 2024-02-04 DIAGNOSIS — R609 Edema, unspecified: Secondary | ICD-10-CM | POA: Diagnosis not present

## 2024-02-04 DIAGNOSIS — I872 Venous insufficiency (chronic) (peripheral): Secondary | ICD-10-CM | POA: Diagnosis not present

## 2024-02-04 DIAGNOSIS — G4700 Insomnia, unspecified: Secondary | ICD-10-CM | POA: Diagnosis not present

## 2024-02-04 DIAGNOSIS — N189 Chronic kidney disease, unspecified: Secondary | ICD-10-CM | POA: Diagnosis not present

## 2024-02-04 DIAGNOSIS — M109 Gout, unspecified: Secondary | ICD-10-CM | POA: Diagnosis not present

## 2024-02-04 DIAGNOSIS — K279 Peptic ulcer, site unspecified, unspecified as acute or chronic, without hemorrhage or perforation: Secondary | ICD-10-CM | POA: Diagnosis not present

## 2024-02-04 DIAGNOSIS — E032 Hypothyroidism due to medicaments and other exogenous substances: Secondary | ICD-10-CM | POA: Diagnosis not present

## 2024-02-04 DIAGNOSIS — I10 Essential (primary) hypertension: Secondary | ICD-10-CM | POA: Diagnosis not present

## 2024-02-04 DIAGNOSIS — I4891 Unspecified atrial fibrillation: Secondary | ICD-10-CM | POA: Diagnosis not present

## 2024-02-04 DIAGNOSIS — I5033 Acute on chronic diastolic (congestive) heart failure: Secondary | ICD-10-CM | POA: Diagnosis not present

## 2024-02-04 DIAGNOSIS — E785 Hyperlipidemia, unspecified: Secondary | ICD-10-CM | POA: Diagnosis not present

## 2024-02-07 DIAGNOSIS — K219 Gastro-esophageal reflux disease without esophagitis: Secondary | ICD-10-CM | POA: Diagnosis not present

## 2024-02-07 DIAGNOSIS — R059 Cough, unspecified: Secondary | ICD-10-CM | POA: Diagnosis not present

## 2024-02-07 DIAGNOSIS — K279 Peptic ulcer, site unspecified, unspecified as acute or chronic, without hemorrhage or perforation: Secondary | ICD-10-CM | POA: Diagnosis not present

## 2024-02-07 DIAGNOSIS — I5033 Acute on chronic diastolic (congestive) heart failure: Secondary | ICD-10-CM | POA: Diagnosis not present

## 2024-02-07 DIAGNOSIS — R609 Edema, unspecified: Secondary | ICD-10-CM | POA: Diagnosis not present

## 2024-02-08 DIAGNOSIS — L89312 Pressure ulcer of right buttock, stage 2: Secondary | ICD-10-CM | POA: Diagnosis not present

## 2024-02-10 DIAGNOSIS — I5033 Acute on chronic diastolic (congestive) heart failure: Secondary | ICD-10-CM | POA: Diagnosis not present

## 2024-02-10 DIAGNOSIS — K279 Peptic ulcer, site unspecified, unspecified as acute or chronic, without hemorrhage or perforation: Secondary | ICD-10-CM | POA: Diagnosis not present

## 2024-02-10 DIAGNOSIS — I4891 Unspecified atrial fibrillation: Secondary | ICD-10-CM | POA: Diagnosis not present

## 2024-02-10 DIAGNOSIS — K219 Gastro-esophageal reflux disease without esophagitis: Secondary | ICD-10-CM | POA: Diagnosis not present

## 2024-02-11 DIAGNOSIS — F1027 Alcohol dependence with alcohol-induced persisting dementia: Secondary | ICD-10-CM | POA: Diagnosis not present

## 2024-02-11 DIAGNOSIS — I5033 Acute on chronic diastolic (congestive) heart failure: Secondary | ICD-10-CM | POA: Diagnosis not present

## 2024-02-11 DIAGNOSIS — R609 Edema, unspecified: Secondary | ICD-10-CM | POA: Diagnosis not present

## 2024-02-11 DIAGNOSIS — I4891 Unspecified atrial fibrillation: Secondary | ICD-10-CM | POA: Diagnosis not present

## 2024-02-11 DIAGNOSIS — N189 Chronic kidney disease, unspecified: Secondary | ICD-10-CM | POA: Diagnosis not present

## 2024-02-15 DIAGNOSIS — L89312 Pressure ulcer of right buttock, stage 2: Secondary | ICD-10-CM | POA: Diagnosis not present

## 2024-02-16 ENCOUNTER — Telehealth (HOSPITAL_COMMUNITY): Payer: Self-pay

## 2024-02-16 NOTE — Telephone Encounter (Signed)
 Called to confirm/remind patient of their appointment at the [facility] on 02/17/24.   Appointment:   [] Confirmed  [x] Left mess   [] No answer/No voice mail  [] VM Full/unable to leave message  [] Phone not in service  Patient reminded to bring in all medications and/or complete list.

## 2024-02-17 ENCOUNTER — Ambulatory Visit (HOSPITAL_COMMUNITY)
Admission: RE | Admit: 2024-02-17 | Discharge: 2024-02-17 | Disposition: A | Discharge status: Home / Self Care | Source: Ambulatory Visit | Attending: Cardiology | Admitting: Cardiology

## 2024-02-17 ENCOUNTER — Ambulatory Visit (HOSPITAL_COMMUNITY): Payer: Self-pay | Admitting: Cardiology

## 2024-02-17 ENCOUNTER — Encounter (HOSPITAL_COMMUNITY)

## 2024-02-17 ENCOUNTER — Encounter (HOSPITAL_COMMUNITY): Payer: Self-pay

## 2024-02-17 VITALS — BP 112/62 | HR 90 | Wt 290.0 lb

## 2024-02-17 DIAGNOSIS — M1990 Unspecified osteoarthritis, unspecified site: Secondary | ICD-10-CM | POA: Diagnosis not present

## 2024-02-17 DIAGNOSIS — G4733 Obstructive sleep apnea (adult) (pediatric): Secondary | ICD-10-CM | POA: Diagnosis not present

## 2024-02-17 DIAGNOSIS — N179 Acute kidney failure, unspecified: Secondary | ICD-10-CM

## 2024-02-17 DIAGNOSIS — I4819 Other persistent atrial fibrillation: Secondary | ICD-10-CM | POA: Diagnosis present

## 2024-02-17 DIAGNOSIS — N184 Chronic kidney disease, stage 4 (severe): Secondary | ICD-10-CM | POA: Diagnosis not present

## 2024-02-17 DIAGNOSIS — E785 Hyperlipidemia, unspecified: Secondary | ICD-10-CM | POA: Insufficient documentation

## 2024-02-17 DIAGNOSIS — I5032 Chronic diastolic (congestive) heart failure: Secondary | ICD-10-CM | POA: Diagnosis present

## 2024-02-17 DIAGNOSIS — Z87891 Personal history of nicotine dependence: Secondary | ICD-10-CM | POA: Insufficient documentation

## 2024-02-17 DIAGNOSIS — I2510 Atherosclerotic heart disease of native coronary artery without angina pectoris: Secondary | ICD-10-CM | POA: Insufficient documentation

## 2024-02-17 DIAGNOSIS — H9193 Unspecified hearing loss, bilateral: Secondary | ICD-10-CM | POA: Diagnosis not present

## 2024-02-17 DIAGNOSIS — I480 Paroxysmal atrial fibrillation: Secondary | ICD-10-CM | POA: Insufficient documentation

## 2024-02-17 DIAGNOSIS — Z7901 Long term (current) use of anticoagulants: Secondary | ICD-10-CM | POA: Insufficient documentation

## 2024-02-17 DIAGNOSIS — I7121 Aneurysm of the ascending aorta, without rupture: Secondary | ICD-10-CM | POA: Insufficient documentation

## 2024-02-17 DIAGNOSIS — K219 Gastro-esophageal reflux disease without esophagitis: Secondary | ICD-10-CM | POA: Insufficient documentation

## 2024-02-17 DIAGNOSIS — R5381 Other malaise: Secondary | ICD-10-CM | POA: Diagnosis not present

## 2024-02-17 DIAGNOSIS — Z6841 Body Mass Index (BMI) 40.0 and over, adult: Secondary | ICD-10-CM | POA: Diagnosis not present

## 2024-02-17 DIAGNOSIS — I872 Venous insufficiency (chronic) (peripheral): Secondary | ICD-10-CM | POA: Diagnosis not present

## 2024-02-17 DIAGNOSIS — G894 Chronic pain syndrome: Secondary | ICD-10-CM | POA: Insufficient documentation

## 2024-02-17 DIAGNOSIS — Z79899 Other long term (current) drug therapy: Secondary | ICD-10-CM | POA: Insufficient documentation

## 2024-02-17 LAB — LIPID PANEL
Cholesterol: 75 mg/dL (ref 0–200)
HDL: 48 mg/dL (ref >40)
LDL Cholesterol: 18 mg/dL (ref 0–99)
Total CHOL/HDL Ratio: 1.6 ratio
Triglycerides: 46 mg/dL (ref <150)
VLDL: 9 mg/dL (ref 0–40)

## 2024-02-17 LAB — BASIC METABOLIC PANEL WITH GFR
Anion gap: 11 (ref 5–15)
BUN: 51 mg/dL — ABNORMAL HIGH (ref 8–23)
CO2: 29 mmol/L (ref 22–32)
Calcium: 8.6 mg/dL — ABNORMAL LOW (ref 8.9–10.3)
Chloride: 101 mmol/L (ref 98–111)
Creatinine, Ser: 2.41 mg/dL — ABNORMAL HIGH (ref 0.61–1.24)
GFR, Estimated: 26 mL/min — ABNORMAL LOW
Glucose, Bld: 96 mg/dL (ref 70–99)
Potassium: 4.2 mmol/L (ref 3.5–5.1)
Sodium: 141 mmol/L (ref 135–145)

## 2024-02-17 LAB — CBC
HCT: 35.4 % — ABNORMAL LOW (ref 39.0–52.0)
Hemoglobin: 11 g/dL — ABNORMAL LOW (ref 13.0–17.0)
MCH: 29.6 pg (ref 26.0–34.0)
MCHC: 31.1 g/dL (ref 30.0–36.0)
MCV: 95.2 fL (ref 80.0–100.0)
Platelets: 382 K/uL (ref 150–400)
RBC: 3.72 MIL/uL — ABNORMAL LOW (ref 4.22–5.81)
RDW: 15.9 % — ABNORMAL HIGH (ref 11.5–15.5)
WBC: 8.9 K/uL (ref 4.0–10.5)
nRBC: 0 % (ref 0.0–0.2)

## 2024-02-17 LAB — BRAIN NATRIURETIC PEPTIDE: B Natriuretic Peptide: 142.2 pg/mL — ABNORMAL HIGH (ref 0.0–100.0)

## 2024-02-17 MED ORDER — TORSEMIDE 40 MG PO TABS: 40.0000 mg | ORAL_TABLET | Freq: Two times a day (BID) | ORAL | Status: DC

## 2024-02-17 MED ORDER — AMIODARONE HCL 200 MG PO TABS: 200.0000 mg | ORAL_TABLET | Freq: Two times a day (BID) | ORAL | Status: DC

## 2024-02-17 MED ORDER — METOPROLOL SUCCINATE ER 25 MG PO TB24: 50.0000 mg | ORAL_TABLET | Freq: Every day | ORAL | 0 refills | Status: DC

## 2024-02-17 NOTE — Progress Notes (Signed)
 ADVANCED HF CLINIC NOTE  Primary Care: Yolande Toribio [name], MD Primary Cardiologist: Gordy Bergamo, MD HF Cardiologist: Dr. Rolan  HPI: Carlos Hanson is a 83 y.o. male with history of chronic HFpEF, PAF on Xarelto , OA, venous stasis, CAD, GERD, HLD, OSA on CPAP and obesity.    Presented to [facility] 9/25 CHF exacerbation with massive hypervolemia. HR initially in 50s-70s on arrival, but developed AF RVR. Stated on cardizem  gtt, transitioned to IV amio. UA +, started on rocephin . Diuresis started with IV lasix . Lasix  was stopped prematurely, due to mild elevation in Cr. He then failed DCCV x2 due to persistent volume overload. AHF consulted and diuresed further. He was taken for RHC 02/01/24 with RA 6, PA 29/20 (20), PCW 12, CO/CI 5.58/2.26 without inotrope support. S/p successful DCCV to NSR. Placed on GDMT and discharged to SNF at a weight of 130.7 kg.  He returns today to establish care with his wife, he is currently in SNF. Hard of hearing and hearing aide batteries dead during visit. Overall feeling well per patient, wife feels otherwise. NYHA II. Reports deconditioning and unable to stand, working with PT daily. He feels that his edema has been improving. Has not been short of breath, but does not move much. Sedentary. Denies chest pain, dyspnea, palpitations, and dizziness. Able to perform ADLs. Doesn't eat a whole lot. Weight at SNF up 3lbs since discharge. Compliant with all medications. Wife was upset with how little information she was given during admission. Hospital course and current medical condition explained, appreciative of explanation and reports understanding.   Past Medical History:  Diagnosis Date   Arthritis    Chronic back pain    Coronary artery disease    40% mLAD/DIAG bifurcation 03/25/10    Dysrhythmia    History of atrial fibrillation   Gout    H/O hiatal hernia    Hearing decreased    right ear   History of bladder infections    History of bleeding ulcers    History of  ulcer disease    Hypercholesteremia    Nocturia    Obstructive sleep apnea hypopnea, severe    Sleep apnea    cpap sleep study 2011    Current Outpatient Medications  Medication Sig Dispense Refill   acetaminophen  (TYLENOL ) 325 MG tablet Take 2 tablets (650 mg total) by mouth every 4 (four) hours as needed for headache or mild pain (pain score 1-3).     atorvastatin  (LIPITOR) 20 MG tablet Take 1 tablet by mouth daily.     clobetasol (TEMOVATE) 0.05 % external solution Apply 1 Application topically daily as needed (psoriasis).     donepezil  (ARICEPT ) 5 MG tablet Take 5 mg by mouth at bedtime.     DULoxetine  (CYMBALTA ) 60 MG capsule Take 1 capsule (60 mg total) by mouth 2 (two) times daily. 60 capsule 0   gabapentin  (NEURONTIN ) 300 MG capsule Take 1 capsule (300 mg total) by mouth 3 (three) times daily. 90 capsule 0   lactulose  (CHRONULAC ) 10 GM/15ML solution Take 30 mLs (20 g total) by mouth daily as needed for mild constipation. 240 mL 1   levothyroxine  (SYNTHROID ) 50 MCG tablet Take 50 mcg by mouth daily before breakfast.     MELATONIN PO Take 10 mg by mouth at bedtime.     Multiple Vitamins-Minerals (VISION FORMULA EYE HEALTH) CAPS Take 1 tablet by mouth 2 (two) times daily. Vision Health     Rivaroxaban  (XARELTO ) 15 MG TABS tablet Take 1 tablet (  15 mg total) by mouth daily with supper. 90 tablet 3   SENEXON-S 8.6-50 MG tablet Take 1 tablet by mouth at bedtime.     amiodarone  (PACERONE ) 200 MG tablet Take 1 tablet (200 mg total) by mouth 2 (two) times daily. Take 200 mg twice daily for 10 days, until 10/12 then on 10/13 start taking 200 mg daily.     famotidine  (PEPCID ) 20 MG tablet Take 20 mg by mouth daily.     ketoconazole (NIZORAL) 2 % cream Apply 1 Application topically daily as needed for irritation.     metoprolol  succinate (TOPROL -XL) 25 MG 24 hr tablet Take 2 tablets (50 mg total) by mouth daily. 60 tablet 0   oxymetazoline  (AFRIN) 0.05 % nasal spray Place 1 spray into both  nostrils at bedtime as needed for congestion (sinuses).     Torsemide 40 MG TABS Take 40 mg by mouth in the morning and at bedtime.     No current facility-administered medications for this encounter.    Allergies  Allergen Reactions   Celecoxib Other (See Comments)    Ineffective   Penicillin G Sodium Nausea Only   Naprosyn [Naproxen] Rash      Social History   Socioeconomic History   Marital status: Married    Spouse name: [name]    Number of children: 3   Years of education: college   Highest education level: Not on file  Occupational History    Comment: Retired  Tobacco Use   Smoking status: Former    Current packs/day: 0.00    Average packs/day: 1 pack/day for 25.0 years (25.0 ttl pk-yrs)    Types: Cigarettes    Start date: 07/13/1980    Quit date: 07/13/2005    Years since quitting: 18.6   Smokeless tobacco: Former    Types: Chew   Tobacco comments:    Quit 8 years ago.  Vaping Use   Vaping status: Never Used  Substance and Sexual Activity   Alcohol  use: Yes    Alcohol /week: 2.0 standard drinks of alcohol     Types: 2 Shots of liquor per week    Comment: once a week   Drug use: No   Sexual activity: Not on file  Other Topics Concern   Not on file  Social History Narrative   Patient lives at home with [name]).    Retired.   Education college.   Right handed.   Caffeine one cup of coffee daily.   Social Drivers of Corporate investment banker Strain: Not on file  Food Insecurity: No Food Insecurity (01/21/2024)   Hunger Vital Sign    Worried About Running Out of Food in the Last Year: Never true    Ran Out of Food in the Last Year: Never true  Transportation Needs: No Transportation Needs (01/21/2024)   PRAPARE - Administrator, Civil Service (Medical): No    Lack of Transportation (Non-Medical): No  Physical Activity: Not on file  Stress: Not on file  Social Connections: Moderately Isolated (01/21/2024)   Social Connection and  Isolation Panel    Frequency of Communication with Friends and Family: More than three times a week    Frequency of Social Gatherings with Friends and Family: More than three times a week    Attends Religious Services: Patient declined    Database administrator or Organizations: No    Attends Engineer, structural: Patient declined    Marital Status: Married  Intimate  Partner Violence: Not At Risk (01/21/2024)   Humiliation, Afraid, Rape, and Kick questionnaire    Fear of Current or Ex-Partner: No    Emotionally Abused: No    Physically Abused: No    Sexually Abused: No      Family History  Problem Relation Age of Onset   Heart failure Mother    Diabetes Mother    Arthritis Mother    High blood pressure Mother    Heart failure Father    Colon cancer Neg Hx    Sleep apnea Neg Hx    Blood pressure 112/62, pulse 90, weight 131.5 kg (290 lb), SpO2 96%.  Filed Weights   02/17/24 1202  Weight: 131.5 kg (290 lb)   PHYSICAL EXAM: General: Sedentary appearing. No distress on RA Cardiac: JVP difficult to assess. S1 and S2 present. No murmurs  Resp: Lung sounds clear and equal B/L Extremities: Warm and dry.  3+ BLE edema.  Neuro: Alert and oriented x3. Affect pleasant.   ECG (personally reviewed): Afib 99 bpm with RBBB and PVC, QRS 134 ms  ASSESSMENT & PLAN:  1. Chronic HFpEF - Echo 9/25: EF 55%. Mild concentric LVH, RV mildly reduced,  mild dilatation of aortic root 39 mm. Mild dilatation of ascending aorta 39 mm. Small pericardial effusion present.  - RHC 09/30 with optimized filling pressures and preserved CO.   - NYHA II, limited mostly by deconditioning. Volume significantly elevated on exam.  - GDMT limited by CKD - increase torsemide to 40 mg bid - increase toprol  XL 50 mg daily - No SGLT2i with recent UTI and mobility - instructed SNF to wrap legs for edema   2. Paroxysmal Atrial Fibrillation - Failed DCCV x 2 9/25 in the setting of volume overload - Repeat  attempt with ERAF within a few hours - Currently not a candidate for AVJ + PPM with acute conditions - doubt that with persistent volume overload would hold in NSR at this time, will plan for rate control - increase amio back to 200 mg bid - increase Toprol  XL to 50 daily to assist with rate control - continue Xarelto , check CBC   3. CAD - 3v CAD seen on CT chest 3/25 - continue atorva 40 mg daily. Lipid panel today  3. CKD IV - Cr has been consistently >2 ?new baseline - last sCr 2.28 - BMET today   4. OSA on CPAP - Needs better compliance (take off in the middle of the night)   5. Morbid obesity - Body mass index is 40.45 kg/m. - consider GLP1 once more mobile  6. Ascending Aortic Aneurysm - noted 3.9 cm on echo 5/25 - needs surveillance  Follow up in 4 weeks with APP  [place] Miryam Mcelhinney, NP 02/17/24

## 2024-02-17 NOTE — Patient Instructions (Addendum)
 Good to see you today!  INCREASE toprol  to 50 mg daily  INCREASE torsemide to 40 mg Twice daily  INCREASE Amiodarone  200 mg Twice daily  Labs done today, your results will be available in MyChart, we will contact you for abnormal readings  Your physician recommends that you schedule a follow-up appointment as scheduled  If you have any questions or concerns before your next appointment please send us  a message through [place] or call our office at [phone].    TO LEAVE A MESSAGE FOR THE NURSE SELECT OPTION 2, PLEASE LEAVE A MESSAGE INCLUDING: YOUR NAME DATE OF BIRTH CALL BACK NUMBER REASON FOR CALL**this is important as we prioritize the call backs  YOU WILL RECEIVE A CALL BACK THE SAME DAY AS LONG AS YOU CALL BEFORE 4:00 PM At the [facility], you and your health needs are our priority. As part of our continuing mission to provide you with exceptional heart care, we have created designated Provider Care Teams. These Care Teams include your primary Cardiologist (physician) and Advanced Practice Providers (APPs- Physician Assistants and Nurse Practitioners) who all work together to provide you with the care you need, when you need it.   You may see any of the following providers on your designated Care Team at your next follow up: Dr Toribio Fuel Dr Ezra Shuck Dr. Ria Commander Dr. Morene Brownie Amy Lenetta, NP Caffie Shed, [place] [facility] [place], [place] Beckey Coe, NP [place] Lee, NP Ellouise Class, NP Tinnie Redman, PharmD Jaun Bash, PharmD   Please be sure to bring in all your medications bottles to every appointment.    Thank you for choosing [facility]

## 2024-02-18 DIAGNOSIS — I10 Essential (primary) hypertension: Secondary | ICD-10-CM | POA: Diagnosis not present

## 2024-02-18 DIAGNOSIS — I5033 Acute on chronic diastolic (congestive) heart failure: Secondary | ICD-10-CM | POA: Diagnosis not present

## 2024-02-18 DIAGNOSIS — F1027 Alcohol dependence with alcohol-induced persisting dementia: Secondary | ICD-10-CM | POA: Diagnosis not present

## 2024-02-18 DIAGNOSIS — I4891 Unspecified atrial fibrillation: Secondary | ICD-10-CM | POA: Diagnosis not present

## 2024-02-22 DIAGNOSIS — L89312 Pressure ulcer of right buttock, stage 2: Secondary | ICD-10-CM | POA: Diagnosis not present

## 2024-03-02 DIAGNOSIS — I2510 Atherosclerotic heart disease of native coronary artery without angina pectoris: Secondary | ICD-10-CM | POA: Diagnosis not present

## 2024-03-02 DIAGNOSIS — I5033 Acute on chronic diastolic (congestive) heart failure: Secondary | ICD-10-CM | POA: Diagnosis not present

## 2024-03-02 DIAGNOSIS — G252 Other specified forms of tremor: Secondary | ICD-10-CM | POA: Diagnosis not present

## 2024-03-02 DIAGNOSIS — E032 Hypothyroidism due to medicaments and other exogenous substances: Secondary | ICD-10-CM | POA: Diagnosis not present

## 2024-03-02 DIAGNOSIS — F1027 Alcohol dependence with alcohol-induced persisting dementia: Secondary | ICD-10-CM | POA: Diagnosis not present

## 2024-03-03 ENCOUNTER — Telehealth (HOSPITAL_COMMUNITY): Payer: Self-pay | Admitting: Cardiology

## 2024-03-03 DIAGNOSIS — I4891 Unspecified atrial fibrillation: Secondary | ICD-10-CM | POA: Diagnosis not present

## 2024-03-03 DIAGNOSIS — R251 Tremor, unspecified: Secondary | ICD-10-CM | POA: Diagnosis not present

## 2024-03-03 NOTE — Telephone Encounter (Signed)
 We can increase toprol  XL to 75 mg daily.

## 2024-03-03 NOTE — Telephone Encounter (Signed)
 Patients wife called to request follow up change from inpatient to video visit. Reports pt is still in SNF and requires hoyer lift for transport. Reports the dont want to pay that price again. -explained importance of in patient visit (assessments, labs, EKG etc), wife states testing can be done at [facility] Appointment changed to video visit  2. Wife also reports tremors with amiodarone . Reports PA at SNF decreased dose (200-> 100) to assist with shakes/tremors. Wife is very concerned about med change vs pt tremors -requests call from provider directly

## 2024-03-03 NOTE — Telephone Encounter (Signed)
 Order faxed.

## 2024-03-03 NOTE — Telephone Encounter (Signed)
 Wife aware of appt advise. Will keep appt as virtual for the time  Kim RN with [facility] Reports vitals today B/p 136/76 HR 81  Confirmed PA decreased amio to 100 BID this week in hopes of decreasing tremors. -6 month wash out noted  Metoprolol  dose 50 daily   Fax# in the event orders are needed for SNF [phone]

## 2024-03-03 NOTE — Telephone Encounter (Signed)
 Given his massive volume overload it would be easier to assess him via in person visit.   We had increased amiodarone  to 200 mg BID to control his heart rate. He is now on just 100 mg daily? Please confirm.

## 2024-03-06 ENCOUNTER — Inpatient Hospital Stay (HOSPITAL_COMMUNITY)
Admission: EM | Admit: 2024-03-06 | Discharge: 2024-03-13 | DRG: 300 | Disposition: A | Discharge status: Skilled Nursing Facility | Attending: Internal Medicine | Admitting: Internal Medicine

## 2024-03-06 ENCOUNTER — Encounter: Payer: Self-pay | Admitting: Radiology

## 2024-03-06 ENCOUNTER — Emergency Department (HOSPITAL_COMMUNITY)

## 2024-03-06 ENCOUNTER — Encounter (HOSPITAL_COMMUNITY): Payer: Self-pay

## 2024-03-06 DIAGNOSIS — R000 Tachycardia, unspecified: Secondary | ICD-10-CM | POA: Diagnosis not present

## 2024-03-06 DIAGNOSIS — D631 Anemia in chronic kidney disease: Secondary | ICD-10-CM | POA: Diagnosis present

## 2024-03-06 DIAGNOSIS — I70261 Atherosclerosis of native arteries of extremities with gangrene, right leg: Principal | ICD-10-CM | POA: Diagnosis present

## 2024-03-06 DIAGNOSIS — Z7901 Long term (current) use of anticoagulants: Secondary | ICD-10-CM

## 2024-03-06 DIAGNOSIS — I509 Heart failure, unspecified: Secondary | ICD-10-CM | POA: Diagnosis not present

## 2024-03-06 DIAGNOSIS — E785 Hyperlipidemia, unspecified: Secondary | ICD-10-CM | POA: Diagnosis not present

## 2024-03-06 DIAGNOSIS — I96 Gangrene, not elsewhere classified: Secondary | ICD-10-CM | POA: Diagnosis not present

## 2024-03-06 DIAGNOSIS — I878 Other specified disorders of veins: Secondary | ICD-10-CM | POA: Diagnosis present

## 2024-03-06 DIAGNOSIS — N189 Chronic kidney disease, unspecified: Secondary | ICD-10-CM | POA: Diagnosis not present

## 2024-03-06 DIAGNOSIS — M1990 Unspecified osteoarthritis, unspecified site: Secondary | ICD-10-CM | POA: Diagnosis present

## 2024-03-06 DIAGNOSIS — Z886 Allergy status to analgesic agent status: Secondary | ICD-10-CM

## 2024-03-06 DIAGNOSIS — I480 Paroxysmal atrial fibrillation: Secondary | ICD-10-CM | POA: Diagnosis not present

## 2024-03-06 DIAGNOSIS — N184 Chronic kidney disease, stage 4 (severe): Secondary | ICD-10-CM | POA: Diagnosis present

## 2024-03-06 DIAGNOSIS — Z87891 Personal history of nicotine dependence: Secondary | ICD-10-CM

## 2024-03-06 DIAGNOSIS — G4733 Obstructive sleep apnea (adult) (pediatric): Secondary | ICD-10-CM | POA: Diagnosis present

## 2024-03-06 DIAGNOSIS — I4891 Unspecified atrial fibrillation: Secondary | ICD-10-CM | POA: Diagnosis not present

## 2024-03-06 DIAGNOSIS — I2510 Atherosclerotic heart disease of native coronary artery without angina pectoris: Secondary | ICD-10-CM | POA: Diagnosis present

## 2024-03-06 DIAGNOSIS — M7989 Other specified soft tissue disorders: Secondary | ICD-10-CM | POA: Diagnosis not present

## 2024-03-06 DIAGNOSIS — Z720 Tobacco use: Secondary | ICD-10-CM | POA: Diagnosis not present

## 2024-03-06 DIAGNOSIS — I77810 Thoracic aortic ectasia: Secondary | ICD-10-CM | POA: Diagnosis present

## 2024-03-06 DIAGNOSIS — G8929 Other chronic pain: Secondary | ICD-10-CM | POA: Diagnosis present

## 2024-03-06 DIAGNOSIS — Z87820 Personal history of traumatic brain injury: Secondary | ICD-10-CM

## 2024-03-06 DIAGNOSIS — K5900 Constipation, unspecified: Secondary | ICD-10-CM | POA: Diagnosis present

## 2024-03-06 DIAGNOSIS — K449 Diaphragmatic hernia without obstruction or gangrene: Secondary | ICD-10-CM | POA: Diagnosis present

## 2024-03-06 DIAGNOSIS — Z96612 Presence of left artificial shoulder joint: Secondary | ICD-10-CM | POA: Diagnosis present

## 2024-03-06 DIAGNOSIS — I130 Hypertensive heart and chronic kidney disease with heart failure and stage 1 through stage 4 chronic kidney disease, or unspecified chronic kidney disease: Secondary | ICD-10-CM | POA: Diagnosis present

## 2024-03-06 DIAGNOSIS — Z6838 Body mass index (BMI) 38.0-38.9, adult: Secondary | ICD-10-CM

## 2024-03-06 DIAGNOSIS — Z48812 Encounter for surgical aftercare following surgery on the circulatory system: Secondary | ICD-10-CM | POA: Diagnosis not present

## 2024-03-06 DIAGNOSIS — E7800 Pure hypercholesterolemia, unspecified: Secondary | ICD-10-CM | POA: Diagnosis present

## 2024-03-06 DIAGNOSIS — Z79890 Hormone replacement therapy (postmenopausal): Secondary | ICD-10-CM

## 2024-03-06 DIAGNOSIS — I5032 Chronic diastolic (congestive) heart failure: Secondary | ICD-10-CM | POA: Diagnosis not present

## 2024-03-06 DIAGNOSIS — F0390 Unspecified dementia without behavioral disturbance: Secondary | ICD-10-CM | POA: Diagnosis present

## 2024-03-06 DIAGNOSIS — Z833 Family history of diabetes mellitus: Secondary | ICD-10-CM

## 2024-03-06 DIAGNOSIS — N179 Acute kidney failure, unspecified: Secondary | ICD-10-CM | POA: Diagnosis present

## 2024-03-06 DIAGNOSIS — E039 Hypothyroidism, unspecified: Secondary | ICD-10-CM | POA: Diagnosis present

## 2024-03-06 DIAGNOSIS — Z79899 Other long term (current) drug therapy: Secondary | ICD-10-CM

## 2024-03-06 DIAGNOSIS — Z8249 Family history of ischemic heart disease and other diseases of the circulatory system: Secondary | ICD-10-CM

## 2024-03-06 DIAGNOSIS — K219 Gastro-esophageal reflux disease without esophagitis: Secondary | ICD-10-CM | POA: Diagnosis present

## 2024-03-06 DIAGNOSIS — R0989 Other specified symptoms and signs involving the circulatory and respiratory systems: Secondary | ICD-10-CM | POA: Diagnosis not present

## 2024-03-06 DIAGNOSIS — R251 Tremor, unspecified: Secondary | ICD-10-CM | POA: Diagnosis not present

## 2024-03-06 LAB — COMPREHENSIVE METABOLIC PANEL WITH GFR
ALT: 16 U/L (ref 0–44)
AST: 18 U/L (ref 15–41)
Albumin: 2.3 g/dL — ABNORMAL LOW (ref 3.5–5.0)
Alkaline Phosphatase: 63 U/L (ref 38–126)
Anion gap: 11 (ref 5–15)
BUN: 43 mg/dL — ABNORMAL HIGH (ref 8–23)
CO2: 31 mmol/L (ref 22–32)
Calcium: 8.7 mg/dL — ABNORMAL LOW (ref 8.9–10.3)
Chloride: 99 mmol/L (ref 98–111)
Creatinine, Ser: 2.65 mg/dL — ABNORMAL HIGH (ref 0.61–1.24)
GFR, Estimated: 23 mL/min — ABNORMAL LOW (ref >60)
Glucose, Bld: 100 mg/dL — ABNORMAL HIGH (ref 70–99)
Potassium: 3.8 mmol/L (ref 3.5–5.1)
Sodium: 141 mmol/L (ref 135–145)
Total Bilirubin: 0.8 mg/dL (ref 0.0–1.2)
Total Protein: 6.1 g/dL — ABNORMAL LOW (ref 6.5–8.1)

## 2024-03-06 LAB — I-STAT CG4 LACTIC ACID, ED: Lactic Acid, Venous: 1.2 mmol/L (ref 0.5–1.9)

## 2024-03-06 MED ORDER — SODIUM CHLORIDE 0.9 % IV SOLN
3.0000 g | Freq: Once | INTRAVENOUS | Status: AC
  Administered 2024-03-06: 3 g via INTRAVENOUS
  Filled 2024-03-06: qty 8

## 2024-03-06 MED ORDER — FENTANYL CITRATE (PF) 50 MCG/ML IJ SOSY
50.0000 ug | PREFILLED_SYRINGE | Freq: Once | INTRAMUSCULAR | Status: AC
  Administered 2024-03-06: 50 ug via INTRAVENOUS
  Filled 2024-03-06: qty 1

## 2024-03-06 MED ORDER — LINEZOLID 600 MG/300ML IV SOLN
600.0000 mg | Freq: Once | INTRAVENOUS | Status: AC
  Administered 2024-03-06: 600 mg via INTRAVENOUS
  Filled 2024-03-06: qty 300

## 2024-03-06 NOTE — ED Notes (Signed)
 PT was soiled and was changed into a new depends. Purewick was placed due to incontinence.

## 2024-03-06 NOTE — TOC Progression Note (Addendum)
 Transition of Care [facility]) - Progression Note    Patient Details  Name: Carlos Hanson MRN: [id number] Date of Birth: Nov 30, 1940  Transition of Care [facility]) CM/SW Contact  Stefan JONELLE Cory, RN Phone Number: 03/06/2024, 5:34 PM  Clinical Narrative:    Per chart review patient currently at [facility] ED. Patient presents from [facility].   This RNCM received call re: [facility] request. Patient is currently in diagnostic radiology and unable to reach. This RNCM left voicemail with patient's wife Carlos Hanson for a call back re: HH needs.      TOC will continue to follow   6:02pm This RNCM spoke with patient's wife Carlos Hanson to offer [facility] choice. Carlos Hanson reports patient currently resides at [facility] who offers rehab services at the ALF. Per chart review patient ED disposition is admission and will need assistance with medication at discharge.                      Expected Discharge Plan and Services    Patient wish to return home to [facility] with rehab services.                                            Social Drivers of Health (SDOH) Interventions SDOH Screenings   Food Insecurity: No Food Insecurity (01/21/2024)  Housing: Low Risk  (01/21/2024)  Transportation Needs: No Transportation Needs (01/21/2024)  Utilities: Not At Risk (01/21/2024)  Social Connections: Moderately Isolated (01/21/2024)  Tobacco Use: Medium Risk (03/06/2024)    Readmission Risk Interventions     No data to display

## 2024-03-06 NOTE — Consult Note (Signed)
 Hospital Consult    Reason for Consult:  Gangrenous right 5th toe Referring Physician:  ED MRN #:  [id number]  History of Present Illness: This is a 83 y.o. 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  Patient states he is recovering in an assisted living facility after recent admission for heart failure.  States he just noticed his right fifth toe was discolored several days ago.  States has had a hard time standing and transferring since his recent hospitalization and feels weak.  Past Medical History:  Diagnosis Date   Arthritis    Chronic back pain    Coronary artery disease    40% mLAD/DIAG bifurcation 03/25/10    Dysrhythmia    History of atrial fibrillation   Gout    H/O hiatal hernia    Hearing decreased    right ear   History of bladder infections    History of bleeding ulcers    History of ulcer disease    Hypercholesteremia    Nocturia    Obstructive sleep apnea hypopnea, severe    Sleep apnea    cpap sleep study 2011    Past Surgical History:  Procedure Laterality Date   BACK SURGERY     CARDIAC CATHETERIZATION     2011   CARDIOVERSION N/A 01/24/2024   Procedure: CARDIOVERSION;  Surgeon: Kate Lonni CROME, MD;  Location: [facility] INVASIVE CV LAB;  Service: Cardiovascular;  Laterality: N/A;   CARDIOVERSION N/A 01/26/2024   Procedure: CARDIOVERSION;  Surgeon: Loni Soyla LABOR, MD;  Location: [facility] INVASIVE CV LAB;  Service: Cardiovascular;  Laterality: N/A;   CARDIOVERSION N/A 02/01/2024   Procedure: CARDIOVERSION;  Surgeon: Rolan Ezra RAMAN, MD;  Location: [facility] INVASIVE CV LAB;  Service: Cardiovascular;  Laterality: N/A;   cardioverson     COLONOSCOPY  08/27/2011   Procedure: COLONOSCOPY;  Surgeon: Claudis RAYMOND Rivet, MD;  Location: [facility] ENDO SUITE;  Service: Endoscopy;  Laterality: N/A;  930   ESOPHAGOGASTRODUODENOSCOPY     JOINT REPLACEMENT     left shoulder   Left shoulder  replacement     LUMBAR LAMINECTOMY  03/15/2012   Procedure: LUMBAR LAMINECTOMY WITH  X-STOP 2 LEVEL;  Surgeon: Victory Gens, MD;  Location: [facility] NEURO ORS;  Service: Neurosurgery;  Laterality: N/A;  Lumbar two-three, three-four XSTOP   RIGHT HEART CATH N/A 02/01/2024   Procedure: RIGHT HEART CATH;  Surgeon: Rolan Ezra RAMAN, MD;  Location: [facility] INVASIVE CV LAB;  Service: Cardiovascular;  Laterality: N/A;   TONSILLECTOMY      Allergies  Allergen Reactions   Celecoxib Other (See Comments)    Ineffective   Penicillin G Sodium Nausea Only   Naprosyn [Naproxen] Rash    Prior to Admission medications   Medication Sig Start Date End Date Taking? Authorizing Provider  acetaminophen  (TYLENOL ) 325 MG tablet Take 2 tablets (650 mg total) by mouth every 4 (four) hours as needed for headache or mild pain (pain score 1-3). 02/03/24   Arrien, Elidia Sieving, MD  amiodarone  (PACERONE ) 200 MG tablet Take 1 tablet (200 mg total) by mouth 2 (two) times daily. Take 200 mg twice daily for 10 days, until 10/12 then on 10/13 start taking 200 mg daily. 02/17/24   Lee, Jordan, NP  atorvastatin  (LIPITOR) 20 MG tablet Take 1 tablet by mouth daily. 07/22/18   [provider]  clobetasol (TEMOVATE) 0.05 % external solution Apply 1 Application topically daily as needed (psoriasis). 05/24/23  [provider]  donepezil  (ARICEPT ) 5 MG tablet Take 5 mg by mouth at bedtime. 04/23/23   [provider]  DULoxetine  (CYMBALTA ) 60 MG capsule Take 1 capsule (60 mg total) by mouth 2 (two) times daily. 02/03/24   Arrien, Mauricio Daniel, MD  famotidine  (PEPCID ) 20 MG tablet Take 20 mg by mouth daily. 04/04/21   [provider]  gabapentin  (NEURONTIN ) 300 MG capsule Take 1 capsule (300 mg total) by mouth 3 (three) times daily. 02/03/24   Arrien, Mauricio Daniel, MD  ketoconazole (NIZORAL) 2 % cream Apply 1 Application topically daily as needed for irritation. 06/29/23   [provider]  lactulose   (CHRONULAC ) 10 GM/15ML solution Take 30 mLs (20 g total) by mouth daily as needed for mild constipation. 02/03/24   Arrien, Mauricio Daniel, MD  levothyroxine  (SYNTHROID ) 50 MCG tablet Take 50 mcg by mouth daily before breakfast.    [provider]  MELATONIN PO Take 10 mg by mouth at bedtime.    [provider]  metoprolol  succinate (TOPROL -XL) 25 MG 24 hr tablet Take 2 tablets (50 mg total) by mouth daily. 02/17/24   Lee, Jordan, NP  Multiple Vitamins-Minerals (VISION FORMULA EYE HEALTH) CAPS Take 1 tablet by mouth 2 (two) times daily. [employer]    [provider]  oxymetazoline  (AFRIN) 0.05 % nasal spray Place 1 spray into both nostrils at bedtime as needed for congestion (sinuses).    [provider]  Rivaroxaban  (XARELTO ) 15 MG TABS tablet Take 1 tablet (15 mg total) by mouth daily with supper. 02/03/24   Arrien, Mauricio Daniel, MD  SENEXON-S 8.6-50 MG tablet Take 1 tablet by mouth at bedtime.    [provider]  Torsemide 40 MG TABS Take 40 mg by mouth in the morning and at bedtime. 02/17/24   Lee, Jordan, NP    Social History   Socioeconomic History   Marital status: Married    Spouse name: [name]    Number of children: 3   Years of education: college   Highest education level: Not on file  Occupational History    Comment: Retired  Tobacco Use   Smoking status: Former    Current packs/day: 0.00    Average packs/day: 1 pack/day for 25.0 years (25.0 ttl pk-yrs)    Types: Cigarettes    Start date: 07/13/1980    Quit date: 07/13/2005    Years since quitting: 18.6   Smokeless tobacco: Former    Types: Chew   Tobacco comments:    Quit 8 years ago.  Vaping Use   Vaping status: Never Used  Substance and Sexual Activity   Alcohol  use: Yes    Alcohol /week: 2.0 standard drinks of alcohol     Types: 2 Shots of liquor per week    Comment: once a week   Drug use: No   Sexual activity: Not on file  Other Topics Concern   Not on file   Social History Narrative   Patient lives at home with [name]).    Retired.   Education college.   Right handed.   Caffeine one cup of coffee daily.   Social Drivers of Corporate Investment Banker Strain: Not on file  Food Insecurity: No Food Insecurity (01/21/2024)   Hunger Vital Sign    Worried About Running Out of Food in the Last Year: Never true    Ran Out of Food in the Last Year: Never true  Transportation Needs: No Transportation Needs (01/21/2024)  PRAPARE - Administrator, Civil Service (Medical): No    Lack of Transportation (Non-Medical): No  Physical Activity: Not on file  Stress: Not on file  Social Connections: Moderately Isolated (01/21/2024)   Social Connection and Isolation Panel    Frequency of Communication with Friends and Family: More than three times a week    Frequency of Social Gatherings with Friends and Family: More than three times a week    Attends Religious Services: Patient declined    Database Administrator or Organizations: No    Attends Banker Meetings: Patient declined    Marital Status: Married  Catering Manager Violence: Not At Risk (01/21/2024)   Humiliation, Afraid, Rape, and Kick questionnaire    Fear of Current or Ex-Partner: No    Emotionally Abused: No    Physically Abused: No    Sexually Abused: No     Family History  Problem Relation Age of Onset   Heart failure Mother    Diabetes Mother    Arthritis Mother    High blood pressure Mother    Heart failure Father    Colon cancer Neg Hx    Sleep apnea Neg Hx     ROS: [x]  Positive   [ ]  Negative   [ ]  All sytems reviewed and are negative  Cardiovascular: []  chest pain/pressure []  palpitations []  SOB lying flat []  DOE []  pain in legs while walking []  pain in legs at rest []  pain in legs at night []  non-healing ulcers []  hx of DVT []  swelling in legs  Pulmonary: []  productive cough []  asthma/wheezing []  home O2  Neurologic: []   weakness in []  arms []  legs []  numbness in []  arms []  legs []  hx of CVA []  mini stroke [] difficulty speaking or slurred speech []  temporary loss of vision in one eye []  dizziness  Hematologic: []  hx of cancer []  bleeding problems []  problems with blood clotting easily  Endocrine:   []  diabetes []  thyroid  disease  GI []  vomiting blood []  blood in stool  GU: []  CKD/renal failure []  HD--[]  M/W/F or []  T/T/S []  burning with urination []  blood in urine  Psychiatric: []  anxiety []  depression  Musculoskeletal: []  arthritis []  joint pain  Integumentary: []  rashes []  ulcers  Constitutional: []  fever []  chills   Physical Examination  Vitals:   03/06/24 1703 03/06/24 1711  BP: 99/83   Pulse: 99   Resp: 14   Temp: 97.9 F (36.6 C)   SpO2: 93% 93%   There is no height or weight on file to calculate BMI.  General:  WDWN in NAD Gait: Not observed HENT: WNL, normocephalic Pulmonary: normal non-labored breathing Cardiac: regular Abdomen:  soft, NT/ND Vascular Exam/Pulses: Bilateral femoral pulses palpable Bilateral PT pulses palpable Extremities: Ischemic changes right fifth toe with gangrene as pictured Musculoskeletal: no muscle wasting or atrophy  Neurologic: A&O X 3; Appropriate Affect ; SENSATION: normal; MOTOR FUNCTION:  moving all extremities equally. Speech is fluent/normal     CBC    Component Value Date/Time   WBC 8.9 02/17/2024 1304   RBC 3.72 (L) 02/17/2024 1304   HGB 11.0 (L) 02/17/2024 1304   HGB 14.1 06/27/2018 1209   HCT 35.4 (L) 02/17/2024 1304   HCT 43.2 06/27/2018 1209   PLT 382 02/17/2024 1304   PLT 351 06/27/2018 1209   MCV 95.2 02/17/2024 1304   MCV 91 06/27/2018 1209   MCH 29.6 02/17/2024 1304   MCHC 31.1 02/17/2024 1304  RDW 15.9 (H) 02/17/2024 1304   RDW 13.4 06/27/2018 1209   LYMPHSABS 0.8 07/11/2023 0653   LYMPHSABS 2.0 06/27/2018 1209   MONOABS 0.8 07/11/2023 0653   EOSABS 0.1 07/11/2023 0653   EOSABS 0.3 06/27/2018  1209   BASOSABS 0.0 07/11/2023 0653   BASOSABS 0.0 06/27/2018 1209    BMET    Component Value Date/Time   NA 141 02/17/2024 1304   NA 138 06/27/2018 1209   K 4.2 02/17/2024 1304   CL 101 02/17/2024 1304   CO2 29 02/17/2024 1304   GLUCOSE 96 02/17/2024 1304   BUN 51 (H) 02/17/2024 1304   BUN 19 06/27/2018 1209   CREATININE 2.41 (H) 02/17/2024 1304   CALCIUM  8.6 (L) 02/17/2024 1304   GFRNONAA 26 (L) 02/17/2024 1304   GFRAA 77 06/27/2018 1209    COAGS: Lab Results  Component Value Date   INR 3.7 (H) 07/11/2023   INR 1.00 09/02/2010   INR 1.26 05/01/2010     Non-Invasive Vascular Imaging:    ABI pending   ASSESSMENT/PLAN: This is a 83 y.o. 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  Patient states he is recovering in an assisted living facility after recent admission for heart failure.  States he just noticed his right fifth toe was discolored several days ago.   On exam he has a 2+ palpable right PT pulse at the ankle and I suspect he has adequate inflow for wound healing.  I will check ABIs tomorrow to further evaluate including toe pressure.  Recommended engaging podiatry or Dr. Harden with orthopedics to evaluate for possible toe amputation.  I would hold his Xarelto  pending possible operative intervention.  Vascular will follow.  Lonni DOROTHA Gaskins, MD [facility] Office: [phone]  Lonni JINNY Gaskins

## 2024-03-06 NOTE — ED Triage Notes (Signed)
 Bring by ambulance from [facility].   Pt does not have complaints, however staff called EMS because patient Right foot didn't have a pulse. EMS arrived and found palpable pulse on right foot, however, 5th toe does look necrotic.   PT alert and oriented x4.  Vitals 112/80BP 108 HR 16RR 94%RA SPO2

## 2024-03-06 NOTE — ED Provider Notes (Signed)
 [place] EMERGENCY DEPARTMENT AT [facility] Provider Note   CSN: [id number] Arrival date & time: 03/06/24  1608     Patient presents with: No chief complaint on file.   Carlos Hanson is a 83 y.o. male.  {Add pertinent medical, surgical, social history, OB history to HPI:32924} HPI    83 year old male with medical history significant for 40-year smoking history, gout, HLD, OSA, CAD presenting to the emergency department with a gangrenous toe.  The patient's family states that his toe has been like this for the past few weeks.  There was concern that the patient may not have had a pulse.  His fifth toe appears to be necrotic.  He denies any fevers or chills.  He denies any pain in either foot.  No known history of peripheral arterial disease, denies any history of diabetes.  On arrival, the patient had dopplerable pulses by posterior tibial artery in both feet.  Prior to Admission medications   Medication Sig Start Date End Date Taking? Authorizing Provider  acetaminophen  (TYLENOL ) 325 MG tablet Take 2 tablets (650 mg total) by mouth every 4 (four) hours as needed for headache or mild pain (pain score 1-3). 02/03/24   Arrien, Mauricio Daniel, MD  amiodarone  (PACERONE ) 200 MG tablet Take 1 tablet (200 mg total) by mouth 2 (two) times daily. Take 200 mg twice daily for 10 days, until 10/12 then on 10/13 start taking 200 mg daily. 02/17/24   Lee, Jordan, NP  atorvastatin  (LIPITOR) 20 MG tablet Take 1 tablet by mouth daily. 07/22/18   [provider]  clobetasol (TEMOVATE) 0.05 % external solution Apply 1 Application topically daily as needed (psoriasis). 05/24/23   [provider]  donepezil  (ARICEPT ) 5 MG tablet Take 5 mg by mouth at bedtime. 04/23/23   [provider]  DULoxetine  (CYMBALTA ) 60 MG capsule Take 1 capsule (60 mg total) by mouth 2 (two) times daily. 02/03/24   Arrien, Mauricio Daniel, MD  famotidine  (PEPCID ) 20 MG tablet Take 20 mg by mouth  daily. 04/04/21   [provider]  gabapentin  (NEURONTIN ) 300 MG capsule Take 1 capsule (300 mg total) by mouth 3 (three) times daily. 02/03/24   Arrien, Mauricio Daniel, MD  ketoconazole (NIZORAL) 2 % cream Apply 1 Application topically daily as needed for irritation. 06/29/23   [provider]  lactulose  (CHRONULAC ) 10 GM/15ML solution Take 30 mLs (20 g total) by mouth daily as needed for mild constipation. 02/03/24   Arrien, Mauricio Daniel, MD  levothyroxine  (SYNTHROID ) 50 MCG tablet Take 50 mcg by mouth daily before breakfast.    [provider]  MELATONIN PO Take 10 mg by mouth at bedtime.    [provider]  metoprolol  succinate (TOPROL -XL) 25 MG 24 hr tablet Take 2 tablets (50 mg total) by mouth daily. 02/17/24   Lee, Jordan, NP  Multiple Vitamins-Minerals (VISION FORMULA EYE HEALTH) CAPS Take 1 tablet by mouth 2 (two) times daily. [employer]    [provider]  oxymetazoline  (AFRIN) 0.05 % nasal spray Place 1 spray into both nostrils at bedtime as needed for congestion (sinuses).    [provider]  Rivaroxaban  (XARELTO ) 15 MG TABS tablet Take 1 tablet (15 mg total) by mouth daily with supper. 02/03/24   Arrien, Mauricio Daniel, MD  SENEXON-S 8.6-50 MG tablet Take 1 tablet by mouth at bedtime.    [provider]  Torsemide 40 MG TABS Take 40 mg by mouth in the morning and at bedtime.  02/17/24   Lee, Jordan, NP    Allergies: Celecoxib, Penicillin g sodium, and Naprosyn [naproxen]    Review of Systems  All other systems reviewed and are negative.   Updated Vital Signs BP 99/83   Pulse 99   Temp 97.9 F (36.6 C)   Resp 14   SpO2 93%   Physical Exam Vitals and nursing note reviewed.  Constitutional:      General: He is not in acute distress. HENT:     Head: Normocephalic and atraumatic.  Eyes:     Conjunctiva/sclera: Conjunctivae normal.     Pupils: Pupils are equal, round, and reactive to light.  Cardiovascular:      Rate and Rhythm: Normal rate and regular rhythm.  Pulmonary:     Effort: Pulmonary effort is normal. No respiratory distress.  Abdominal:     General: There is no distension.     Tenderness: There is no guarding.  Musculoskeletal:        General: No deformity or signs of injury.     Cervical back: Neck supple.     Comments: Fifth toe appears to show evidence of dry gangrene.  Difficult to palpate DP pulses however PT pulses able to be appreciated by Doppler bilaterally  Skin:    Findings: No lesion or rash.  Neurological:     General: No focal deficit present.     Mental Status: He is alert. Mental status is at baseline.     (all labs ordered are listed, but only abnormal results are displayed) Labs Reviewed  CULTURE, BLOOD (ROUTINE X 2)  CULTURE, BLOOD (ROUTINE X 2)  CBC WITH DIFFERENTIAL/PLATELET  I-STAT CG4 LACTIC ACID, ED    EKG: None  Radiology: No results found.  {Document cardiac monitor, telemetry assessment procedure when appropriate:32947} Procedures   Medications Ordered in the ED  Ampicillin-Sulbactam (UNASYN) 3 g in sodium chloride  0.9 % 100 mL IVPB (has no administration in time range)    And  linezolid (ZYVOX) IVPB 600 mg (has no administration in time range)      {Click here for ABCD2, HEART and other calculators REFRESH Note before signing:1}                              Medical Decision Making Amount and/or Complexity of Data Reviewed Labs: ordered. Radiology: ordered.  Risk Prescription drug management. Decision regarding hospitalization.   83 year old male with medical history significant for 40-year smoking history, gout, HLD, OSA, CAD presenting to the emergency department with a gangrenous toe.  The patient's family states that his toe has been like this for the past few weeks.  There was concern that the patient may not have had a pulse.  His fifth toe appears to be necrotic.  He denies any fevers or chills.  He denies any pain in  either foot.  No known history of peripheral arterial disease, denies any history of diabetes.  On arrival, the patient had dopplerable pulses by posterior tibial artery in both feet.  On arrival, the patient was vitally stable.  Presenting with a gangrenous right fifth toe.  Toe has been necrotic appearing for the last few weeks.  No evidence of sepsis on arrival, patient afebrile, not tachypneic, hemodynamically stable.  ABIs ordered to further evaluate for possible PAD, x-ray imaging of the right foot obtained, labs obtained and the patient was started on IV Unasyn and linezolid.  Did discuss the care of the patient  with on-call vascular, Dr. Gretta who will see in consultation.  Medicine consulted for admission, Dr. PIERRETTE accepting.   {Document critical care time when appropriate  Document review of labs and clinical decision tools ie CHADS2VASC2, etc  Document your independent review of radiology images and any outside records  Document your discussion with family members, caretakers and with consultants  Document social determinants of health affecting pt's care  Document your decision making why or why not admission, treatments were needed:32947:::1}   Final diagnoses:  None    ED Discharge Orders     None

## 2024-03-07 ENCOUNTER — Other Ambulatory Visit: Payer: Self-pay

## 2024-03-07 ENCOUNTER — Inpatient Hospital Stay (HOSPITAL_COMMUNITY)

## 2024-03-07 DIAGNOSIS — Z7901 Long term (current) use of anticoagulants: Secondary | ICD-10-CM | POA: Diagnosis not present

## 2024-03-07 DIAGNOSIS — I70261 Atherosclerosis of native arteries of extremities with gangrene, right leg: Secondary | ICD-10-CM | POA: Diagnosis not present

## 2024-03-07 DIAGNOSIS — Z833 Family history of diabetes mellitus: Secondary | ICD-10-CM | POA: Diagnosis not present

## 2024-03-07 DIAGNOSIS — M7989 Other specified soft tissue disorders: Secondary | ICD-10-CM | POA: Diagnosis not present

## 2024-03-07 DIAGNOSIS — K219 Gastro-esophageal reflux disease without esophagitis: Secondary | ICD-10-CM | POA: Diagnosis not present

## 2024-03-07 DIAGNOSIS — Z886 Allergy status to analgesic agent status: Secondary | ICD-10-CM | POA: Diagnosis not present

## 2024-03-07 DIAGNOSIS — G8929 Other chronic pain: Secondary | ICD-10-CM | POA: Diagnosis not present

## 2024-03-07 DIAGNOSIS — E7800 Pure hypercholesterolemia, unspecified: Secondary | ICD-10-CM | POA: Diagnosis not present

## 2024-03-07 DIAGNOSIS — Z96612 Presence of left artificial shoulder joint: Secondary | ICD-10-CM | POA: Diagnosis not present

## 2024-03-07 DIAGNOSIS — N179 Acute kidney failure, unspecified: Secondary | ICD-10-CM | POA: Diagnosis not present

## 2024-03-07 DIAGNOSIS — I2510 Atherosclerotic heart disease of native coronary artery without angina pectoris: Secondary | ICD-10-CM | POA: Diagnosis not present

## 2024-03-07 DIAGNOSIS — N184 Chronic kidney disease, stage 4 (severe): Secondary | ICD-10-CM | POA: Diagnosis not present

## 2024-03-07 DIAGNOSIS — I509 Heart failure, unspecified: Secondary | ICD-10-CM | POA: Diagnosis not present

## 2024-03-07 DIAGNOSIS — N189 Chronic kidney disease, unspecified: Secondary | ICD-10-CM | POA: Diagnosis not present

## 2024-03-07 DIAGNOSIS — I96 Gangrene, not elsewhere classified: Secondary | ICD-10-CM | POA: Diagnosis present

## 2024-03-07 DIAGNOSIS — D631 Anemia in chronic kidney disease: Secondary | ICD-10-CM | POA: Diagnosis not present

## 2024-03-07 DIAGNOSIS — Z8249 Family history of ischemic heart disease and other diseases of the circulatory system: Secondary | ICD-10-CM | POA: Diagnosis not present

## 2024-03-07 DIAGNOSIS — G4733 Obstructive sleep apnea (adult) (pediatric): Secondary | ICD-10-CM | POA: Diagnosis not present

## 2024-03-07 DIAGNOSIS — Z87891 Personal history of nicotine dependence: Secondary | ICD-10-CM | POA: Diagnosis not present

## 2024-03-07 DIAGNOSIS — E039 Hypothyroidism, unspecified: Secondary | ICD-10-CM | POA: Diagnosis not present

## 2024-03-07 DIAGNOSIS — I5032 Chronic diastolic (congestive) heart failure: Secondary | ICD-10-CM | POA: Diagnosis not present

## 2024-03-07 DIAGNOSIS — Z48812 Encounter for surgical aftercare following surgery on the circulatory system: Secondary | ICD-10-CM | POA: Diagnosis not present

## 2024-03-07 DIAGNOSIS — F0390 Unspecified dementia without behavioral disturbance: Secondary | ICD-10-CM | POA: Diagnosis not present

## 2024-03-07 DIAGNOSIS — I77810 Thoracic aortic ectasia: Secondary | ICD-10-CM | POA: Diagnosis not present

## 2024-03-07 DIAGNOSIS — Z79899 Other long term (current) drug therapy: Secondary | ICD-10-CM | POA: Diagnosis not present

## 2024-03-07 DIAGNOSIS — I130 Hypertensive heart and chronic kidney disease with heart failure and stage 1 through stage 4 chronic kidney disease, or unspecified chronic kidney disease: Secondary | ICD-10-CM | POA: Diagnosis not present

## 2024-03-07 DIAGNOSIS — Z79890 Hormone replacement therapy (postmenopausal): Secondary | ICD-10-CM | POA: Diagnosis not present

## 2024-03-07 DIAGNOSIS — R000 Tachycardia, unspecified: Secondary | ICD-10-CM | POA: Diagnosis not present

## 2024-03-07 DIAGNOSIS — I480 Paroxysmal atrial fibrillation: Secondary | ICD-10-CM | POA: Diagnosis not present

## 2024-03-07 LAB — CBC WITH DIFFERENTIAL/PLATELET
Abs Immature Granulocytes: 0.03 K/uL (ref 0.00–0.07)
Basophils Absolute: 0.1 K/uL (ref 0.0–0.1)
Basophils Relative: 1 %
Eosinophils Absolute: 0.4 K/uL (ref 0.0–0.5)
Eosinophils Relative: 6 %
HCT: 37 % — ABNORMAL LOW (ref 39.0–52.0)
Hemoglobin: 11.3 g/dL — ABNORMAL LOW (ref 13.0–17.0)
Immature Granulocytes: 0 %
Lymphocytes Relative: 28 %
Lymphs Abs: 2 K/uL (ref 0.7–4.0)
MCH: 29.6 pg (ref 26.0–34.0)
MCHC: 30.5 g/dL (ref 30.0–36.0)
MCV: 96.9 fL (ref 80.0–100.0)
Monocytes Absolute: 0.8 K/uL (ref 0.1–1.0)
Monocytes Relative: 11 %
Neutro Abs: 3.9 K/uL (ref 1.7–7.7)
Neutrophils Relative %: 54 %
Platelets: 377 K/uL (ref 150–400)
RBC: 3.82 MIL/uL — ABNORMAL LOW (ref 4.22–5.81)
RDW: 15.9 % — ABNORMAL HIGH (ref 11.5–15.5)
WBC: 7.2 K/uL (ref 4.0–10.5)
nRBC: 0 % (ref 0.0–0.2)

## 2024-03-07 LAB — BASIC METABOLIC PANEL WITH GFR
Anion gap: 14 (ref 5–15)
BUN: 42 mg/dL — ABNORMAL HIGH (ref 8–23)
CO2: 28 mmol/L (ref 22–32)
Calcium: 8.5 mg/dL — ABNORMAL LOW (ref 8.9–10.3)
Chloride: 98 mmol/L (ref 98–111)
Creatinine, Ser: 2.48 mg/dL — ABNORMAL HIGH (ref 0.61–1.24)
GFR, Estimated: 25 mL/min — ABNORMAL LOW (ref >60)
Glucose, Bld: 87 mg/dL (ref 70–99)
Potassium: 3.7 mmol/L (ref 3.5–5.1)
Sodium: 140 mmol/L (ref 135–145)

## 2024-03-07 LAB — VAS US ABI WITH/WO TBI
Left ABI: 1.22
Right ABI: 1.1

## 2024-03-07 LAB — MRSA NEXT GEN BY PCR, NASAL: MRSA by PCR Next Gen: NOT DETECTED

## 2024-03-07 LAB — APTT: aPTT: 139 s — ABNORMAL HIGH (ref 24–36)

## 2024-03-07 LAB — MAGNESIUM: Magnesium: 2.1 mg/dL (ref 1.7–2.4)

## 2024-03-07 LAB — HEPARIN LEVEL (UNFRACTIONATED): Heparin Unfractionated: >1.1 [IU]/mL — ABNORMAL HIGH (ref 0.30–0.70)

## 2024-03-07 MED ORDER — AMIODARONE HCL 200 MG PO TABS
100.0000 mg | ORAL_TABLET | Freq: Every day | ORAL | Status: DC
  Administered 2024-03-07 – 2024-03-09 (×2): 100 mg via ORAL
  Filled 2024-03-07 (×2): qty 1

## 2024-03-07 MED ORDER — METOPROLOL SUCCINATE ER 25 MG PO TB24
50.0000 mg | ORAL_TABLET | Freq: Every day | ORAL | Status: DC
Start: 2024-03-07 — End: 2024-03-07
  Administered 2024-03-07: 50 mg via ORAL
  Filled 2024-03-07: qty 2

## 2024-03-07 MED ORDER — DULOXETINE HCL 60 MG PO CPEP
60.0000 mg | ORAL_CAPSULE | Freq: Two times a day (BID) | ORAL | Status: DC
  Administered 2024-03-07 – 2024-03-13 (×12): 60 mg via ORAL
  Filled 2024-03-07 (×12): qty 1

## 2024-03-07 MED ORDER — ALBUMIN HUMAN 25 % IV SOLN
12.5000 g | Freq: Once | INTRAVENOUS | Status: AC
Start: 2024-03-08 — End: 2024-03-07
  Administered 2024-03-07: 12.5 g via INTRAVENOUS
  Filled 2024-03-07: qty 50

## 2024-03-07 MED ORDER — HEPARIN (PORCINE) 25000 UT/250ML-% IV SOLN
1400.0000 [IU]/h | INTRAVENOUS | Status: DC
  Administered 2024-03-07: 1400 [IU]/h via INTRAVENOUS
  Filled 2024-03-07 (×2): qty 250

## 2024-03-07 MED ORDER — ATORVASTATIN CALCIUM 10 MG PO TABS
20.0000 mg | ORAL_TABLET | Freq: Every day | ORAL | Status: DC
  Administered 2024-03-07: 20 mg via ORAL
  Filled 2024-03-07: qty 2

## 2024-03-07 MED ORDER — PROSIGHT PO TABS
1.0000 | ORAL_TABLET | Freq: Two times a day (BID) | ORAL | Status: DC
  Administered 2024-03-07 – 2024-03-13 (×12): 1 via ORAL
  Filled 2024-03-07 (×15): qty 1

## 2024-03-07 MED ORDER — METOPROLOL SUCCINATE ER 50 MG PO TB24
75.0000 mg | ORAL_TABLET | Freq: Every day | ORAL | Status: DC
  Filled 2024-03-07: qty 1

## 2024-03-07 MED ORDER — SODIUM CHLORIDE 0.9 % IV BOLUS
250.0000 mL | Freq: Once | INTRAVENOUS | Status: AC
Start: 2024-03-08 — End: 2024-03-07
  Administered 2024-03-07: 250 mL via INTRAVENOUS

## 2024-03-07 MED ORDER — HEPARIN (PORCINE) 25000 UT/250ML-% IV SOLN
1100.0000 [IU]/h | INTRAVENOUS | Status: DC
  Administered 2024-03-08: 1100 [IU]/h via INTRAVENOUS
  Filled 2024-03-07: qty 250

## 2024-03-07 MED ORDER — GABAPENTIN 300 MG PO CAPS
300.0000 mg | ORAL_CAPSULE | Freq: Three times a day (TID) | ORAL | Status: DC
  Administered 2024-03-07 – 2024-03-13 (×18): 300 mg via ORAL
  Filled 2024-03-07 (×2): qty 1
  Filled 2024-03-07: qty 3
  Filled 2024-03-07 (×15): qty 1

## 2024-03-07 MED ORDER — ORAL CARE MOUTH RINSE: 15.0000 mL | OROMUCOSAL | Status: DC | PRN

## 2024-03-07 MED ORDER — PANTOPRAZOLE SODIUM 20 MG PO TBEC
20.0000 mg | DELAYED_RELEASE_TABLET | Freq: Every day | ORAL | Status: DC
  Administered 2024-03-07 – 2024-03-13 (×6): 20 mg via ORAL
  Filled 2024-03-07 (×6): qty 1

## 2024-03-07 MED ORDER — SODIUM CHLORIDE 0.9 % IV SOLN
3.0000 g | Freq: Three times a day (TID) | INTRAVENOUS | Status: DC
  Administered 2024-03-07 – 2024-03-09 (×7): 3 g via INTRAVENOUS
  Filled 2024-03-07 (×9): qty 8

## 2024-03-07 MED ORDER — LINEZOLID 600 MG/300ML IV SOLN
600.0000 mg | Freq: Two times a day (BID) | INTRAVENOUS | Status: DC
  Administered 2024-03-07 – 2024-03-09 (×5): 600 mg via INTRAVENOUS
  Filled 2024-03-07 (×7): qty 300

## 2024-03-07 MED ORDER — PROCHLORPERAZINE EDISYLATE 10 MG/2ML IJ SOLN
5.0000 mg | Freq: Four times a day (QID) | INTRAMUSCULAR | Status: DC | PRN
  Administered 2024-03-07: 5 mg via INTRAVENOUS
  Filled 2024-03-07: qty 2

## 2024-03-07 MED ORDER — MELATONIN 5 MG PO TABS
5.0000 mg | ORAL_TABLET | Freq: Every evening | ORAL | Status: DC | PRN
Start: 2024-03-07 — End: 2024-03-13

## 2024-03-07 MED ORDER — HYDROMORPHONE HCL 1 MG/ML IJ SOLN
0.5000 mg | INTRAMUSCULAR | Status: DC | PRN
  Administered 2024-03-08: 0.5 mg via INTRAVENOUS
  Filled 2024-03-07: qty 0.5

## 2024-03-07 MED ORDER — LEVOTHYROXINE SODIUM 50 MCG PO TABS
50.0000 ug | ORAL_TABLET | Freq: Every day | ORAL | Status: DC
  Administered 2024-03-07 – 2024-03-13 (×6): 50 ug via ORAL
  Filled 2024-03-07 (×7): qty 1

## 2024-03-07 MED ORDER — POLYETHYLENE GLYCOL 3350 17 G PO PACK: 17.0000 g | PACK | Freq: Every day | ORAL | Status: DC | PRN

## 2024-03-07 MED ORDER — BUPRENORPHINE 5 MCG/HR TD PTWK
1.0000 | MEDICATED_PATCH | TRANSDERMAL | Status: DC
  Administered 2024-03-11: 1 via TRANSDERMAL
  Filled 2024-03-07 (×2): qty 1

## 2024-03-07 MED ORDER — OXYCODONE HCL 5 MG PO TABS
5.0000 mg | ORAL_TABLET | Freq: Four times a day (QID) | ORAL | Status: DC | PRN
  Administered 2024-03-09 – 2024-03-12 (×2): 5 mg via ORAL
  Filled 2024-03-07 (×2): qty 1

## 2024-03-07 MED ORDER — ACETAMINOPHEN 325 MG PO TABS
650.0000 mg | ORAL_TABLET | Freq: Four times a day (QID) | ORAL | Status: DC | PRN
  Administered 2024-03-10 – 2024-03-13 (×2): 650 mg via ORAL
  Filled 2024-03-07 (×2): qty 2

## 2024-03-07 MED ORDER — SENNOSIDES-DOCUSATE SODIUM 8.6-50 MG PO TABS
1.0000 | ORAL_TABLET | Freq: Every day | ORAL | Status: DC
  Administered 2024-03-07 – 2024-03-12 (×6): 1 via ORAL
  Filled 2024-03-07 (×6): qty 1

## 2024-03-07 MED ORDER — DONEPEZIL HCL 5 MG PO TABS
5.0000 mg | ORAL_TABLET | Freq: Every day | ORAL | Status: DC
  Administered 2024-03-07 – 2024-03-12 (×6): 5 mg via ORAL
  Filled 2024-03-07 (×6): qty 1

## 2024-03-07 NOTE — Progress Notes (Addendum)
 Noted toe pressure of 0 on ABI.  Will post for angiogram tomorrow.  NPO at midnight.  Lonni DOROTHA Gaskins, MD [facility] [place] Office: [phone]   Lonni JINNY Gaskins

## 2024-03-07 NOTE — Evaluation (Signed)
 Occupational Therapy Evaluation Patient Details Name: Carlos Hanson MRN: [id number] DOB: 07-Feb-1941 Today's Date: 03/07/2024   History of Present Illness   Pt is a 83 y.o. male  who presented on 03/06/24 from [facility] with R 5th toe wound.  PMH:CHF, OA, venous stasis insufficiency, CAD, history of bleeding PUD, HLD, OSA, Afib with RVR     Clinical Impressions Pt resting in bed comfortably, c/o fatigue. Pt states he lives in ALF, Pt was inconsistent multiple times when asked to clarify PLOF, Pt unable to give timeframe and frequently said he walks with rollator, later saying he hasn't walked in 8 months. Pt currently requires mod A for bed mobility, difficulty sitting EOB, effortful to sit straight up without posterior lean. BP reading sitting EOB 137/108, and 186/160(167), although Pt asymptomatic and moving RUE throughout sitting unable to follow commands to remain still. Pt returned to supine and BP 96/70(78). Pt would benefit from continued acute OT to maximize functional strength and activity tolerance, DC to postacute rehab <3hrs/day recommended.      If plan is discharge home, recommend the following:   Two people to help with walking and/or transfers;A lot of help with bathing/dressing/bathroom;Assistance with cooking/housework;Assist for transportation;Help with stairs or ramp for entrance     Functional Status Assessment   Patient has had a recent decline in their functional status and demonstrates the ability to make significant improvements in function in a reasonable and predictable amount of time.     Equipment Recommendations   None recommended by OT     Recommendations for Other Services         Precautions/Restrictions   Precautions Precautions: Fall Recall of Precautions/Restrictions: Impaired Restrictions Weight Bearing Restrictions Per Provider Order: No     Mobility Bed Mobility Overal bed mobility: Needs Assistance Bed Mobility: Supine  to Sit, Sit to Supine     Supine to sit: Min assist, Mod assist Sit to supine: Min assist, Mod assist   General bed mobility comments: min/mod A for bed mobility, increased time and several attempts to scoot to sitting position and back to supine.    Transfers                   General transfer comment: did not attempt, BP too high sitting EOB.      Balance Overall balance assessment: Needs assistance Sitting-balance support: Single extremity supported, Feet supported Sitting balance-Leahy Scale: Poor Sitting balance - Comments: able to sit unsupported with effort for sitting forward due to slight posterior lean       Standing balance comment: did not attempt                           ADL either performed or assessed with clinical judgement   ADL Overall ADL's : Needs assistance/impaired Eating/Feeding: Set up;Bed level   Grooming: Set up;Bed level   Upper Body Bathing: Moderate assistance;Sitting   Lower Body Bathing: Total assistance;Sitting/lateral leans;Bed level   Upper Body Dressing : Moderate assistance;Sitting   Lower Body Dressing: Total assistance;Sitting/lateral leans;Bed level       Toileting- Clothing Manipulation and Hygiene: Total assistance;Bed level         General ADL Comments: Pt able to sit EOB using one hand for balance, difficulty sitting upright while straining to sit forward, making it difficult to complete ADLs without back support. Pt BP increased too high when sitting EOB, returned to bed level activities.  Vision Baseline Vision/History: 1 Wears glasses Ability to See in Adequate Light: 0 Adequate Patient Visual Report: No change from baseline       Perception         Praxis         Pertinent Vitals/Pain Pain Assessment Pain Assessment: No/denies pain     Extremity/Trunk Assessment Upper Extremity Assessment Upper Extremity Assessment: Overall WFL for tasks assessed   Lower Extremity  Assessment Lower Extremity Assessment: Defer to PT evaluation       Communication Communication Communication: No apparent difficulties   Cognition Arousal: Alert Behavior During Therapy: WFL for tasks assessed/performed Cognition: No family/caregiver present to determine baseline             OT - Cognition Comments: Pt A/O but frequently inconsistent with stories and PLOF, unable to answer simple questions, rather he tells very intricate, verbose stories which are often contradictive rather than answering the question.                 Following commands: Impaired Following commands impaired: Follows one step commands inconsistently, Follows one step commands with increased time     Cueing  General Comments   Cueing Techniques: Verbal cues;Gestural cues;Tactile cues;Visual cues  Pt sitting EOB, BP137/108, 186/160(168), Pt was straining to sit forward and tense, returned to supine and BP 96/70(79), Pt asymptomatic throughout session.   Exercises     Shoulder Instructions      Home Living Family/patient expects to be discharged to:: Assisted living                             Home Equipment: Cane - single point;Grab bars - tub/shower;Shower seat - built in;Rollator (4 wheels);Lift chair;Hand held shower head   Additional Comments: [facility], reports has skilled facility as well.      Prior Functioning/Environment Prior Level of Function : History of Falls (last six months);Needs assist             Mobility Comments: Pt reports he has been working with rehab, has not been able to ambulate for several weeks ADLs Comments: needs help    OT Problem List: Decreased strength;Decreased range of motion;Decreased activity tolerance;Impaired balance (sitting and/or standing);Decreased cognition;Decreased safety awareness;Obesity   OT Treatment/Interventions: Self-care/ADL training;Therapeutic exercise;Energy conservation;DME and/or AE  instruction;Therapeutic activities;Cognitive remediation/compensation;Patient/family education;Balance training      OT Goals(Current goals can be found in the care plan section)   Acute Rehab OT Goals Patient Stated Goal: to improve strength OT Goal Formulation: With patient Time For Goal Achievement: 03/21/24 Potential to Achieve Goals: Good   OT Frequency:  Min 2X/week    Co-evaluation              AM-PAC OT 6 Clicks Daily Activity     Outcome Measure Help from another person eating meals?: None Help from another person taking care of personal grooming?: A Little Help from another person toileting, which includes using toliet, bedpan, or urinal?: Total Help from another person bathing (including washing, rinsing, drying)?: A Lot Help from another person to put on and taking off regular upper body clothing?: A Lot Help from another person to put on and taking off regular lower body clothing?: Total 6 Click Score: 13   End of Session Nurse Communication: Mobility status;Other (comment) (high BP reading)  Activity Tolerance: Other (comment) (limited due to high BP reading sitting EOB) Patient left: in bed;with call bell/phone within reach;with bed alarm  set  OT Visit Diagnosis: Unsteadiness on feet (R26.81);Other abnormalities of gait and mobility (R26.89);Repeated falls (R29.6);Muscle weakness (generalized) (M62.81);History of falling (Z91.81);Other symptoms and signs involving cognitive function                Time: 8743-8667 OT Time Calculation (min): 36 min Charges:  OT General Charges $OT Visit: 1 Visit OT Evaluation $OT Eval Moderate Complexity: 1 Mod OT Treatments $Self Care/Home Management : 8-22 mins  [place], OTR/L   Elouise JONELLE Bott 03/07/2024, 2:31 PM

## 2024-03-07 NOTE — ED Notes (Signed)
 Called CCMD to verify patient was still being monitored

## 2024-03-07 NOTE — TOC Initial Note (Signed)
 Transition of Care [facility]) - Initial/Assessment Note    Patient Details  Name: Carlos Hanson MRN: [id number] Date of Birth: 30-Aug-1940  Transition of Care [facility]) CM/SW Contact:    Jeoffrey LITTIE Moose, [place] Phone Number: 03/07/2024, 3:25 PM  Clinical Narrative:                 Pt admitted from [facility] due to pt right foot not having a pulse. Pt lives at home with spouse. CSW spoke with Kristin at [facility], she stated pt is able to return once medically stable for DC from hospital and pt will need a new insurance auth. CSW will continue to follow.   Expected Discharge Plan: Skilled Nursing Facility Barriers to Discharge: English As A Second Language Teacher, Continued Medical Work up   Patient Goals and CMS Choice Patient states their goals for this hospitalization and ongoing recovery are:: Return to [facility]          Expected Discharge Plan and Services       Living arrangements for the past 2 months: Single Family Home                                      Prior Living Arrangements/Services Living arrangements for the past 2 months: Single Family Home Lives with:: Spouse Patient language and need for interpreter reviewed:: Yes Do you feel safe going back to the place where you live?: Yes      Need for Family Participation in Patient Care: No (Comment) Care giver support system in place?: Yes (comment)   Criminal Activity/Legal Involvement Pertinent to Current Situation/Hospitalization: No - Comment as needed  Activities of Daily Living   ADL Screening (condition at time of admission) Independently performs ADLs?: No Does the patient have a NEW difficulty with bathing/dressing/toileting/self-feeding that is expected to last >3 days?: Yes (Initiates electronic notice to provider for possible OT consult) Does the patient have a NEW difficulty with getting in/out of bed, walking, or climbing stairs that is expected to last >3 days?: Yes (Initiates electronic notice to  provider for possible PT consult) Does the patient have a NEW difficulty with communication that is expected to last >3 days?: No Is the patient deaf or have difficulty hearing?: Yes Does the patient have difficulty seeing, even when wearing glasses/contacts?: No Does the patient have difficulty concentrating, remembering, or making decisions?: No  Permission Sought/Granted Permission sought to share information with : Facility Medical Sales Representative, Family Supports    Share Information with NAME: [name]  Permission granted to share info w AGENCY: [facility]  Permission granted to share info w Relationship: Spouse  Permission granted to share info w Contact Information: [phone]  Emotional Assessment Appearance:: Appears stated age Attitude/Demeanor/Rapport: Engaged Affect (typically observed): Pleasant Orientation: : Oriented to Self, Oriented to Place, Oriented to  Time, Oriented to Situation Alcohol  / Substance Use: Not Applicable Psych Involvement: No (comment)  Admission diagnosis:  Dry gangrene (HCC) [I96] Toe necrosis (HCC) [I96] Necrotic toes (HCC) [I96] Patient Active Problem List   Diagnosis Date Noted   Toe necrosis (HCC) 03/07/2024   Pressure injury of skin 02/02/2024   Obesity, class 3 (HCC) 02/02/2024   Dementia (HCC) 02/02/2024   Acute on chronic diastolic heart failure (HCC) 01/26/2024   Acute on chronic diastolic CHF (congestive heart failure) (HCC) 01/25/2024   CKD stage 3b, GFR 30-44 ml/min (HCC) 01/25/2024   Atrial fibrillation with rapid ventricular response (HCC) 01/21/2024  Bilateral lower extremity edema 01/21/2024   Chronic venous insufficiency of lower extremity 01/21/2024   Chronic low back pain 01/21/2024   Acute respiratory failure with hypoxia (HCC) 07/12/2023   Sepsis due to undetermined organism (HCC) 07/11/2023   Multifocal pneumonia 07/11/2023   Acute respiratory insufficiency 07/11/2023   Hypothyroidism 11/03/2018   Essential  hypertension 07/14/2018   Dysphagia 02/22/2017   Major depression, single episode 08/10/2016   Fall 05/01/2016   Morbid obesity due to excess calories (HCC) 05/16/2015   Chronic pain syndrome 05/16/2015   OSA (obstructive sleep apnea) 05/16/2015   Cataract 01/23/2015   RLS (restless legs syndrome) 07/09/2014   Paroxysmal atrial fibrillation (HCC) 07/09/2014   Class 3 obesity with alveolar hypoventilation, serious comorbidity, and body mass index (BMI) of 40.0 to 44.9 in adult (HCC) 07/09/2014   OSA treated with BiPAP 07/09/2014   Urinary tract infection with hematuria 06/29/2014    Class: Acute   Obstructive sleep apnea hypopnea, severe 06/29/2014    Class: Chronic   Hyperlipidemia 04/12/2009   Gastro-esophageal reflux disease without esophagitis 04/12/2009   PCP:  Yolande Toribio [name], MD Pharmacy:   [phone] - [place], [place] - [address] [name] [address] Phone: [phone] Fax: [phone]     Social Drivers of Health (SDOH) Social History: SDOH Screenings   Food Insecurity: No Food Insecurity (03/07/2024)  Housing: Low Risk  (03/07/2024)  Transportation Needs: No Transportation Needs (03/07/2024)  Utilities: Not At Risk (03/07/2024)  Social Connections: Moderately Isolated (03/07/2024)  Tobacco Use: Medium Risk (03/06/2024)   SDOH Interventions:     Readmission Risk Interventions     No data to display

## 2024-03-07 NOTE — H&P (Addendum)
 History and Physical  Carlos Hanson FMW:[id number] DOB: 10-09-1940 DOA: 03/06/2024  Referring physician: Dr. Jerrol, EDP  PCP: Carlos Toribio [name], MD  Outpatient Specialists: Cardiology. Patient coming from: Assisted living rehab facility, lives with his wife who is a retired engineer, civil (consulting).  Chief Complaint: Necrotic fifth toe.  HPI: Carlos Hanson is a 83 y.o. male with medical history significant for chronic HFpEF, paroxysmal A-fib on Xarelto , hypothyroidism, chronic venous stasis, coronary artery disease, hyperlipidemia, OSA on CPAP, obesity, GERD, former tobacco user, who presented to the ER from assisted living rehab facility due to right necrotic fifth toe.  Staff at the facility noticed his R fifth toe discoloration and could not feel his right dorsalis pedis pulse therefore they called EMS.  The patient states his right fifth toe has been changing colors for the past 1 year.  Denies having any pain, fevers, chills, or night sweats.  No cardiopulmonary symptoms.  Upon EMS arrival, vital signs were stable.  Patient was brought into the ER for further evaluation.  In the ER, the patient was seen by vascular surgery.  Recommended to hold off home Xarelto  and to have podiatry/orthopedic surgery Dr. Harden evaluate for possible amputation.  The patient received Unasyn and IV linezolid as well as IV based analgesics in the ER.  Admitted by [facility], hospitalist service.  ED Course: Temperature 97.6.  BP 109/92.  Heart rate 86, respiratory rate 14, O2 saturation 97% on room air.  Review of Systems: Review of systems as noted in the HPI. All other systems reviewed and are negative.   Past Medical History:  Diagnosis Date   Arthritis    Chronic back pain    Coronary artery disease    40% mLAD/DIAG bifurcation 03/25/10    Dysrhythmia    History of atrial fibrillation   Gout    H/O hiatal hernia    Hearing decreased    right ear   History of bladder infections    History of bleeding ulcers    History of  ulcer disease    Hypercholesteremia    Nocturia    Obstructive sleep apnea hypopnea, severe    Sleep apnea    cpap sleep study 2011   Past Surgical History:  Procedure Laterality Date   BACK SURGERY     CARDIAC CATHETERIZATION     2011   CARDIOVERSION N/A 01/24/2024   Procedure: CARDIOVERSION;  Surgeon: Kate Lonni CROME, MD;  Location: [facility] INVASIVE CV LAB;  Service: Cardiovascular;  Laterality: N/A;   CARDIOVERSION N/A 01/26/2024   Procedure: CARDIOVERSION;  Surgeon: Loni Soyla LABOR, MD;  Location: [facility] INVASIVE CV LAB;  Service: Cardiovascular;  Laterality: N/A;   CARDIOVERSION N/A 02/01/2024   Procedure: CARDIOVERSION;  Surgeon: Rolan Ezra RAMAN, MD;  Location: [facility] INVASIVE CV LAB;  Service: Cardiovascular;  Laterality: N/A;   cardioverson     COLONOSCOPY  08/27/2011   Procedure: COLONOSCOPY;  Surgeon: Claudis RAYMOND Rivet, MD;  Location: [facility] ENDO SUITE;  Service: Endoscopy;  Laterality: N/A;  930   ESOPHAGOGASTRODUODENOSCOPY     JOINT REPLACEMENT     left shoulder   Left shoulder replacement     LUMBAR LAMINECTOMY  03/15/2012   Procedure: LUMBAR LAMINECTOMY WITH  X-STOP 2 LEVEL;  Surgeon: Victory Gens, MD;  Location: [facility] NEURO ORS;  Service: Neurosurgery;  Laterality: N/A;  Lumbar two-three, three-four XSTOP   RIGHT HEART CATH N/A 02/01/2024   Procedure: RIGHT HEART CATH;  Surgeon: Rolan Ezra RAMAN, MD;  Location: [facility] INVASIVE CV LAB;  Service: Cardiovascular;  Laterality: N/A;   TONSILLECTOMY      Social History:  reports that he quit smoking about 18 years ago. His smoking use included cigarettes. He started smoking about 43 years ago. He has a 25 pack-year smoking history. He has quit using smokeless tobacco.  His smokeless tobacco use included chew. He reports current alcohol  use of about 2.0 standard drinks of alcohol  per week. He reports that he does not use drugs.   Allergies  Allergen Reactions   Celecoxib Other (See Comments)    Ineffective   Penicillin G Sodium Nausea  Only   Naprosyn [Naproxen] Rash    Family History  Problem Relation Age of Onset   Heart failure Mother    Diabetes Mother    Arthritis Mother    High blood pressure Mother    Heart failure Father    Colon cancer Neg Hx    Sleep apnea Neg Hx       Prior to Admission medications   Medication Sig Start Date End Date Taking? Authorizing Provider  acetaminophen  (TYLENOL ) 325 MG tablet Take 2 tablets (650 mg total) by mouth every 4 (four) hours as needed for headache or mild pain (pain score 1-3). 02/03/24   Arrien, Mauricio Daniel, MD  amiodarone  (PACERONE ) 200 MG tablet Take 1 tablet (200 mg total) by mouth 2 (two) times daily. Take 200 mg twice daily for 10 days, until 10/12 then on 10/13 start taking 200 mg daily. 02/17/24   Lee, Jordan, NP  atorvastatin  (LIPITOR) 20 MG tablet Take 1 tablet by mouth daily. 07/22/18   [provider]  clobetasol (TEMOVATE) 0.05 % external solution Apply 1 Application topically daily as needed (psoriasis). 05/24/23   [provider]  donepezil  (ARICEPT ) 5 MG tablet Take 5 mg by mouth at bedtime. 04/23/23   [provider]  DULoxetine  (CYMBALTA ) 60 MG capsule Take 1 capsule (60 mg total) by mouth 2 (two) times daily. 02/03/24   Arrien, Mauricio Daniel, MD  famotidine  (PEPCID ) 20 MG tablet Take 20 mg by mouth daily. 04/04/21   [provider]  gabapentin  (NEURONTIN ) 300 MG capsule Take 1 capsule (300 mg total) by mouth 3 (three) times daily. 02/03/24   Arrien, Mauricio Daniel, MD  ketoconazole (NIZORAL) 2 % cream Apply 1 Application topically daily as needed for irritation. 06/29/23   [provider]  lactulose  (CHRONULAC ) 10 GM/15ML solution Take 30 mLs (20 g total) by mouth daily as needed for mild constipation. 02/03/24   Arrien, Mauricio Daniel, MD  levothyroxine  (SYNTHROID ) 50 MCG tablet Take 50 mcg by mouth daily before breakfast.    [provider]  MELATONIN PO Take 10 mg by mouth at bedtime.    [provider]  metoprolol  succinate (TOPROL -XL) 25 MG 24 hr tablet Take 2 tablets (50 mg total) by mouth daily. 02/17/24   Lee, Jordan, NP  Multiple Vitamins-Minerals (VISION FORMULA EYE HEALTH) CAPS Take 1 tablet by mouth 2 (two) times daily. [employer]    [provider]  oxymetazoline  (AFRIN) 0.05 % nasal spray Place 1 spray into both nostrils at bedtime as needed for congestion (sinuses).    [provider]  Rivaroxaban  (XARELTO ) 15 MG TABS tablet Take 1 tablet (15 mg total) by mouth daily with supper. 02/03/24   Arrien, Mauricio Daniel, MD  SENEXON-S 8.6-50 MG tablet Take 1 tablet by mouth at bedtime.    [provider]  Torsemide 40 MG TABS Take 40 mg by mouth in the  morning and at bedtime. 02/17/24   Lee, Jordan, NP    Physical Exam: BP (!) 109/92 (BP Location: Left Arm)   Pulse 86   Temp 97.6 F (36.4 C) (Oral)   Resp 14   SpO2 97%   General: 83 y.o. year-old male well developed well nourished in no acute distress.  Alert and oriented x3. Cardiovascular: Regular rate and rhythm with no rubs or gallops.  No thyromegaly or JVD noted.  1+ pitting edema in lower extremities bilaterally. Respiratory: Clear to auscultation with no wheezes or rales. Good inspiratory effort. Abdomen: Soft nontender nondistended with normal bowel sounds x4 quadrants. Muskuloskeletal: Right fifth toe necrotic. Neuro: CN II-XII intact, strength, sensation, reflexes Skin: No ulcerative lesions noted or rashes.  Right fifth toe necrotic. Psychiatry: Judgement and insight appear normal. Mood is appropriate for condition and setting          Labs on Admission:  Basic Metabolic Panel: Recent Labs  Lab 03/06/24 2040  NA 141  K 3.8  CL 99  CO2 31  GLUCOSE 100*  BUN 43*  CREATININE 2.65*  CALCIUM  8.7*   Liver Function Tests: Recent Labs  Lab 03/06/24 2040  AST 18  ALT 16  ALKPHOS 63  BILITOT 0.8  PROT 6.1*  ALBUMIN 2.3*   No results for input(s): LIPASE,  AMYLASE in the last 168 hours. No results for input(s): AMMONIA in the last 168 hours. CBC: Recent Labs  Lab 03/07/24 0009  WBC 7.2  NEUTROABS 3.9  HGB 11.3*  HCT 37.0*  MCV 96.9  PLT 377   Cardiac Enzymes: No results for input(s): CKTOTAL, CKMB, CKMBINDEX, TROPONINI in the last 168 hours.  BNP (last 3 results) Recent Labs    07/11/23 0653 02/17/24 1304  BNP 125.3* 142.2*    ProBNP (last 3 results) No results for input(s): PROBNP in the last 8760 hours.  CBG: No results for input(s): GLUCAP in the last 168 hours.  Radiological Exams on Admission: DG Foot 2 Views Right Result Date: 03/06/2024 CLINICAL DATA:  [id number] Necrotic toes (HCC) [id number] EXAM: RIGHT FOOT - 2 VIEW COMPARISON:  None Available. FINDINGS: No acute fracture or dislocation. There is no evidence of arthropathy or other focal bone abnormality. Extensive soft tissue swelling about the toes and dorsum of the foot. No subcutaneous gas appreciated. IMPRESSION: Extensive soft tissue swelling about the toes and dorsum of the foot. No radiographic findings of osteomyelitis, at this time. Electronically Signed   By: Rogelia Myers M.D.   On: 03/06/2024 18:38    EKG: I independently viewed the EKG done and my findings are as followed: None available at the time of this visit.  Assessment/Plan Present on Admission:  Toe necrosis (HCC)  Principal Problem:   Toe necrosis (HCC)  Right fifth toe necrosis, POA Suspected peripheral vascular disease. Seen by vascular surgery, following. Received IV antibiotics Will continue IV antibiotics for now until seen by orthopedic surgery. Monitor fever curve and WBCs Follow ABI Follow-up peripheral blood cultures x 2 Consult orthopedic surgery, Dr. Harden, in the morning.  AKI on CKD 3B, suspect prerenal in the setting of dehydration from poor oral intake At baseline, creatinine 2.0 GFR 32 Presented with creatinine of 2.65 GFR 23. Avoid nephrotoxic agents,  dehydration, and hypotension. Monitor urine output Repeat BMP in the morning.  Suspected peripheral vascular disease Resume home Lipitor Defer antiplatelet therapy to vascular surgery The patient is on Xarelto  for A-fib, which has been held due to possible surgical intervention.  Anemia of  chronic disease in the setting of CKD Hemoglobin is at baseline No overt bleeding reported. Continue to monitor H&H  Paroxysmal A-fib on Xarelto  Home Xarelto  on hold Start heparin drip while off Xarelto  Continue to monitor on telemetry. Restart home Xarelto  when okay with orthopedic surgery.  Chronic HFpEF Euvolemic on exam Monitor strict I's and O's and daily weight  Hypothyroidism Resume home regimen.  OSA Resume home CPAP nightly  Obesity Recommend weight loss outpatient with regular physical activity and healthy diet.   Critical care time: 55 minutes.   DVT prophylaxis: Heparin drip.  Home Xarelto  is on hold.  Restart home Xarelto  when okay with orthopedic surgery.  Code Status: Full code.  Family Communication: None at bedside.  Disposition Plan: Admitted to telemetry unit.  Consults called: Vascular surgery consulted by EDP.  Admission status: Inpatient status.   Status is: Inpatient The patient requires at least 2 midnights for further evaluation and treatment of present condition.   Terry LOISE Hurst MD [facility] Pager [phone]  If 7PM-7AM, please contact night-coverage [email or web address] Password [facility]  03/07/2024, 12:36 AM

## 2024-03-07 NOTE — ED Notes (Signed)
 Contacted CCMD to have patient added to monitoring

## 2024-03-07 NOTE — Progress Notes (Signed)
 [facility]  Subjective  - no complaints   Objective 116/74 91 (!) 97.5 F (36.4 C) (Oral) 19 96% No intake or output data in the 24 hours ending 03/07/24 0648  Foot wound as pictured Right 5th toe wound   Laboratory Lab Results: Recent Labs    03/07/24 0009  WBC 7.2  HGB 11.3*  HCT 37.0*  PLT 377   BMET Recent Labs    03/06/24 2040 03/07/24 0405  NA 141 140  K 3.8 3.7  CL 99 98  CO2 31 28  GLUCOSE 100* 87  BUN 43* 42*  CREATININE 2.65* 2.48*  CALCIUM  8.7* 8.5*    COAG Lab Results  Component Value Date   INR 3.7 (H) 07/11/2023   INR 1.00 09/02/2010   INR 1.26 05/01/2010   No results found for: PTT  Assessment/Planning:   83 y.o. male that vascular surgery was consulted for right 5th toe gangrene.     On exam he has a 2+ palpable right PT pulse at the ankle and I suspect he has adequate inflow for wound healing.  Awaiting ABI today including toe pressure.  Recommended engaging podiatry or Dr. Harden with orthopedics to evaluate for possible toe amputation.  I would hold his Xarelto  pending possible operative intervention.   Carlos Hanson 03/07/2024 6:48 AM --

## 2024-03-07 NOTE — Progress Notes (Signed)
 PHARMACY - ANTICOAGULATION CONSULT NOTE  Pharmacy Consult for Heparin (Xarelto  on hold) Indication: atrial fibrillation  Allergies  Allergen Reactions   Celecoxib Other (See Comments)    Ineffective   Penicillin G Sodium Nausea Only   Naprosyn [Naproxen] Rash    Vital Signs: Temp: 97.8 F (36.6 C) (11/04 0125) Temp Source: Oral (11/04 0125) BP: 108/56 (11/04 0300) Pulse Rate: 92 (11/04 0300)  Labs: Recent Labs    03/06/24 2040 03/07/24 0009 03/07/24 0405  HGB  --  11.3*  --   HCT  --  37.0*  --   PLT  --  377  --   CREATININE 2.65*  --  2.48*    CrCl cannot be calculated (Unknown ideal weight.).   Medical History: Past Medical History:  Diagnosis Date   Arthritis    Chronic back pain    Coronary artery disease    40% mLAD/DIAG bifurcation 03/25/10    Dysrhythmia    History of atrial fibrillation   Gout    H/O hiatal hernia    Hearing decreased    right ear   History of bladder infections    History of bleeding ulcers    History of ulcer disease    Hypercholesteremia    Nocturia    Obstructive sleep apnea hypopnea, severe    Sleep apnea    cpap sleep study 2011   Assessment: 83 y/o M from outside facility with gangrenous toe. On Xarelto  PTA for afib, holding Xarelto  and starting heparin. Per RN, last dose of Xarelto  was >24 hours ago, will start heparin now. Anticipate using aPTT to dose. Above labs reviewed.   Goal of Therapy:  Heparin level 0.3-0.7 units/ml aPTT 66-102 seconds Monitor platelets by anticoagulation protocol: Yes   Plan:  Start heparin drip at 1400 units/hr Heparin level and aPTT in 8 hours Daily CBC/Heparin level/aPTT Monitor for bleeding  Lynwood Mckusick, PharmD, BCPS Clinical Pharmacist Phone: [phone]

## 2024-03-07 NOTE — Progress Notes (Signed)
 PROGRESS NOTE  Carlos Hanson  DOB: November 13, 1940  PCP: Yolande Toribio [name], MD FMW:[id number]  DOA: 03/06/2024  LOS: 0 days  Hospital Day: 2  Subjective: Patient was seen and examined this morning.  Pleasant elderly Caucasian male.  Lying on bed.  Not in distress. Wife on the phone. Remains hemodynamically stable Repeat labs this morning with creatinine of 2.48  Brief narrative: Carlos Hanson is a 83 y.o. male with PMH significant for obesity, severe OSA, HTN, HLD, paroxysmal A-fib on Xarelto , CHF, chronic venous stasis, CAD, HLD, hiatal hernia, arthritis, chronic back pain, former smoker Patient was living in an independent living facility at [facility] man[facility] Most recently hospitalized 9/19-10/2 for acute worsening of CHF.  Since then, patient has been living at the SNF part of the facility.   Patient noticed discoloration of his right fifth toe several days ago.  On 11/3, staff could not feel pulse on right dorsalis pedis, and noted necrotic right great toe.  Called EMS and sent him to ED.  In the ED, patient was afebrile, hemodynamically stable, breathing on room air Labs with WC count 8.9, hemoglobin 11, BUN/creatinine 51/2.41 Blood culture sent X-ray right foot showed extensive soft tissue swelling about the toes and dorsum of the foot without any findings of osteomyelitis  Vascular surgery was consulted Started on IV antibiotics Recommended to hold Xarelto  and consult Dr. Harden Admitted to [facility]  Assessment and plan: Right fifth toe necrosis, POA Suspected peripheral vascular disease Presented with necrotic right fifth toe  seen by vascular surgery Pending ABI I have reached out to orthopedics Dr. Harden Blood culture sent from ED Currently on empiric antibiotic coverage with IV Unasyn and IV Zyvox No fever.  WC count, lactic acid normal Recent Labs  Lab 03/06/24 1831 03/07/24 0009  WBC  --  7.2  LATICACIDVEN 1.2  --    AKI on CKD 4 Baseline creatinine less than 2.3   Presented with creatinine elevated to 2.65, suspect prerenal in the setting of dehydration from poor oral intake Gradually improving.  Continue to monitor Monitor urine output Repeat BMP in the morning showed an improvement in creatinine to 2.48  Recent Labs    01/28/24 1104 01/29/24 0425 01/30/24 0238 01/31/24 0550 02/01/24 0500 02/02/24 0500 02/03/24 0640 02/17/24 1304 03/06/24 2040 03/07/24 0405  BUN 49* 57* 61* 58* 51* 48* 47* 51* 43* 42*  CREATININE 1.92* 2.26* 2.36* 2.32* 2.13* 2.02* 2.28* 2.41* 2.65* 2.48*  CO2 29 30 31 31 32 29 30 29 31  28   Paroxysmal A-fib On his recent hospitalist, patient failed DC cardioversion.  He was started on amiodarone  load and discharged on 200 mg daily. Per wife, he has had progressive worsening mobility.  Amiodarone  was cited as the of that and hence he has only been getting amiodarone  100 mg instead of 200 mg daily.  It seems Toprol  has been increased to 75 mg daily for that reason.   For now I would continue amiodarone  at 100 mg daily and Toprol  at 75 mg daily Chronically anticoagulated on Xarelto .  Currently on hold in anticipation of procedure On heparin drip in the meantime  Chronic diastolic CHF HTN Euvolemic.  Blood pressure in normal range Last echo 01/24/2024 showed EF of 55%, normal pulmonary artery pressure PTA meds- Toprol  75 mg daily, torsemide 40 mg twice daily  CAD, HLD Continue Lipitor.  Xarelto  plan as above  Anemia of CKD GERD/hiatal hernia Hemoglobin slightly low but stable PPI to continue Recent Labs  02/01/24 0500 02/01/24 0936 02/01/24 0939 02/17/24 1304 03/07/24 0009  HGB 10.8* 11.6* 11.9* 11.0* 11.3*  MCV 93.1  --   --  95.2 96.9   Hypothyroidism Continue Synthroid  50 mcg daily  Obesity 2 Body mass index is 37.01 kg/m. Patient has been advised to make an attempt to improve diet and exercise patterns to aid in weight loss.   OSA Continue nightly CPAP  Osteoarthritis chronic back pain PTA  meds- Butrans patch weekly, gabapentin  300 mg 3 times daily, Cymbalta  60 mg twice daily Resume all  Constipation Bowel regimen with scheduled Senokot and as needed MiraLAX   Dementia Aricept  5 mg nightly     Mobility: Will obtain PT eval postprocedure PT Orders: Active   PT Follow up Rec:    Goals of care   Code Status: Full Code     DVT prophylaxis:  SCDs Start: 03/07/24 0014   Antimicrobials: Broad spectrum antibiotics IV Fluid: None Consultants: Vascular, orthopedics Family Communication: Wife on the phone  Status: Inpatient Level of care:  Telemetry   Patient is from: [facility] SNF Needs to continue in-hospital care: Hopefully back to their Anticipated d/c to: Pending clinical course   Diet:  Diet Order             Diet NPO time specified  Diet effective midnight           Diet Heart Room service appropriate? Yes; Fluid consistency: Thin  Diet effective now                   Scheduled Meds:  amiodarone   100 mg Oral Daily   atorvastatin   20 mg Oral Daily   [START ON 03/11/2024] buprenorphine  1 patch Transdermal Weekly   donepezil   5 mg Oral QHS   DULoxetine   60 mg Oral BID   gabapentin   300 mg Oral TID   levothyroxine   50 mcg Oral QAC breakfast   [START ON 03/08/2024] metoprolol  succinate  75 mg Oral Daily   multivitamin  1 tablet Oral BID   pantoprazole   20 mg Oral Daily   senna-docusate  1 tablet Oral QHS    PRN meds: acetaminophen , HYDROmorphone  (DILAUDID ) injection, melatonin, mouth rinse, oxyCODONE , polyethylene glycol, prochlorperazine   Infusions:   ampicillin-sulbactam (UNASYN) IV 3 g (03/07/24 1308)   And   linezolid (ZYVOX) IV 600 mg (03/07/24 1055)   heparin 1,400 Units/hr (03/07/24 0605)    Antimicrobials: Anti-infectives (From admission, onward)    Start     Dose/Rate Route Frequency Ordered Stop   03/07/24 1000  linezolid (ZYVOX) IVPB 600 mg       Placed in And Linked Group   600 mg 300 mL/hr over 60 Minutes  Intravenous Every 12 hours 03/07/24 0329     03/07/24 0600  Ampicillin-Sulbactam (UNASYN) 3 g in sodium chloride  0.9 % 100 mL IVPB       Placed in And Linked Group   3 g 200 mL/hr over 30 Minutes Intravenous Every 8 hours 03/07/24 0329     03/06/24 1715  Ampicillin-Sulbactam (UNASYN) 3 g in sodium chloride  0.9 % 100 mL IVPB       Placed in And Linked Group   3 g 200 mL/hr over 30 Minutes Intravenous  Once 03/06/24 1711 03/06/24 2224   03/06/24 1715  linezolid (ZYVOX) IVPB 600 mg       Placed in And Linked Group   600 mg 300 mL/hr over 60 Minutes Intravenous  Once 03/06/24 1711 03/06/24 2224  Objective: Vitals:   03/07/24 0537 03/07/24 0931  BP: 116/74 103/77  Pulse: 91 79  Resp: 19 17  Temp: (!) 97.5 F (36.4 C) 98.7 F (37.1 C)  SpO2: 96% 95%    Intake/Output Summary (Last 24 hours) at 03/07/2024 1413 Last data filed at 03/07/2024 0900 Gross per 24 hour  Intake 470.96 ml  Output --  Net 470.96 ml   Filed Weights   03/07/24 0500  Weight: 123.8 kg   Weight change:  Body mass index is 37.01 kg/m.   Physical Exam: General exam: Pleasant, elderly Caucasian male.  Not in pain Skin: No rashes, lesions or ulcers. HEENT: Atraumatic, normocephalic, no obvious bleeding Lungs: Clear to auscultation bilaterally,  CVS: S1, S2, no murmur,   GI/Abd: Soft, nontender, nondistended, bowel sound present,   CNS: Alert, awake, slow to answer but oriented x 3 Psychiatry: Mood appropriate Extremities: No pedal edema, no calf tenderness,   Data Review: I have personally reviewed the laboratory data and studies available.  F/u labs ordered Unresulted Labs (From admission, onward)     Start     Ordered   03/07/24 1400  Heparin level (unfractionated)  Once-Timed,   TIMED        03/07/24 0609   03/07/24 1400  APTT  Once-Timed,   TIMED        03/07/24 9390            Signed, Chapman Rota, MD [facility] 03/07/2024

## 2024-03-07 NOTE — Evaluation (Signed)
 Physical Therapy Evaluation Patient Details Name: Carlos Hanson MRN: [id number] DOB: 01-05-1941 Today's Date: 03/07/2024  History of Present Illness  Pt is a 83 y.o. male  who presented on 03/06/24 from [facility] with R 5th toe wound.  PMH:CHF, OA, venous stasis insufficiency, CAD, history of bleeding PUD, HLD, OSA, Afib with RVR  Clinical Impression  Pt admitted with above diagnosis. Pt has been at SNF since last admission and reports that he was working on STS but was unable to ambulate. Standing tolerance has been limited by new tremor. Pt needed mod A to come to EOB. Experienced dizziness first 5 mins sitting but passed after that. Attempted standing with stedy for extra support and was able to clear buttocks but not fully extend knees or trunk to achieve full upright. Patient will benefit from continued inpatient follow up therapy, <3 hours/day.  Pt currently with functional limitations due to the deficits listed below (see PT Problem List). Pt will benefit from acute skilled PT to increase their independence and safety with mobility to allow discharge.           If plan is discharge home, recommend the following: Two people to help with walking and/or transfers;A lot of help with bathing/dressing/bathroom;Assistance with cooking/housework;Assist for transportation   Can travel by private vehicle   No    Equipment Recommendations None recommended by PT  Recommendations for Other Services       Functional Status Assessment Patient has had a recent decline in their functional status and demonstrates the ability to make significant improvements in function in a reasonable and predictable amount of time.     Precautions / Restrictions Precautions Precautions: Fall Recall of Precautions/Restrictions: Impaired Restrictions Weight Bearing Restrictions Per Provider Order: No      Mobility  Bed Mobility Overal bed mobility: Needs Assistance Bed Mobility: Supine to Sit, Sit to  Supine     Supine to sit: Mod assist Sit to supine: Mod assist   General bed mobility comments: mod A for elevation of trunk into sitting. Pt able to square hips EOB with increased time and effort. Pt needed mod A to LE's for return to bed as well  as to scoot up    Transfers Overall transfer level: Needs assistance Equipment used: Ambulation equipment used Transfers: Sit to/from Stand Sit to Stand: Mod assist, From elevated surface, Via lift equipment           General transfer comment: attempted sit>stand 2x with pt able to clear buttocks from bed but unable to fully extend knees or hips to come to full upright. Pt was able to scoot along bedside in sitting with min A with use of UE's and LE's Transfer via Lift Equipment: Stedy  Ambulation/Gait               General Gait Details: unable  Stairs            Wheelchair Mobility     Tilt Bed    Modified Rankin (Stroke Patients Only)       Balance Overall balance assessment: Needs assistance Sitting-balance support: Single extremity supported, Feet supported Sitting balance-Leahy Scale: Fair Sitting balance - Comments: dizzy in sitting but improved after 5 mins sitting. No LOB in sitting even when dizzy     Standing balance-Leahy Scale: Zero                               Pertinent Vitals/Pain  Pain Assessment Pain Assessment: No/denies pain    Home Living Family/patient expects to be discharged to:: Skilled nursing facility                 Home Equipment: Cane - single point;Grab bars - tub/shower;Shower seat - built in;Rollator (4 wheels);Lift chair;Hand held shower head Additional Comments: Has been in SNF section of [facility] since last hospitalization    Prior Function Prior Level of Function : History of Falls (last six months);Needs assist             Mobility Comments: has been working on STS with therapy but has been limited by new tremor. Has been unable to walk  since last admission ADLs Comments: needs help     Extremity/Trunk Assessment   Upper Extremity Assessment Upper Extremity Assessment: Generalized weakness (tremor noted at rest and with mvmt)    Lower Extremity Assessment Lower Extremity Assessment: Generalized weakness (tremor noted with attempt to stand)    Cervical / Trunk Assessment Cervical / Trunk Assessment: Other exceptions Cervical / Trunk Exceptions: increased body habitus  Communication   Communication Communication: Impaired Factors Affecting Communication: Reduced clarity of speech;Hearing impaired (R ear better than L)    Cognition Arousal: Alert Behavior During Therapy: WFL for tasks assessed/performed   PT - Cognitive impairments: Memory                       PT - Cognition Comments: pt has no memory of last admission. STM deficits noted in general Following commands: Impaired Following commands impaired: Follows one step commands inconsistently, Follows one step commands with increased time     Cueing Cueing Techniques: Verbal cues, Gestural cues, Tactile cues, Visual cues     General Comments General comments (skin integrity, edema, etc.): Pt stable throughout    Exercises     Assessment/Plan    PT Assessment Patient needs continued PT services  PT Problem List Decreased strength;Decreased activity tolerance;Decreased balance;Decreased mobility;Obesity;Decreased cognition       PT Treatment Interventions DME instruction;Gait training;Functional mobility training;Therapeutic activities;Therapeutic exercise;Balance training;Neuromuscular re-education;Cognitive remediation;Patient/family education    PT Goals (Current goals can be found in the Care Plan section)  Acute Rehab PT Goals Patient Stated Goal: get stronger PT Goal Formulation: With patient Time For Goal Achievement: 03/21/24 Potential to Achieve Goals: Good    Frequency Min 2X/week     Co-evaluation                AM-PAC PT 6 Clicks Mobility  Outcome Measure Help needed turning from your back to your side while in a flat bed without using bedrails?: A Little Help needed moving from lying on your back to sitting on the side of a flat bed without using bedrails?: A Lot Help needed moving to and from a bed to a chair (including a wheelchair)?: A Lot Help needed standing up from a chair using your arms (e.g., wheelchair or bedside chair)?: Total Help needed to walk in hospital room?: Total Help needed climbing 3-5 steps with a railing? : Total 6 Click Score: 10    End of Session Equipment Utilized During Treatment: Gait belt Activity Tolerance: Patient limited by fatigue Patient left: in bed;with call bell/phone within reach;with bed alarm set Nurse Communication: Mobility status PT Visit Diagnosis: Muscle weakness (generalized) (M62.81);Difficulty in walking, not elsewhere classified (R26.2);Dizziness and giddiness (R42)    Time: 1430-1456 PT Time Calculation (min) (ACUTE ONLY): 26 min   Charges:   PT Evaluation $PT  Eval Moderate Complexity: 1 Mod PT Treatments $Therapeutic Activity: 8-22 mins PT General Charges $$ ACUTE PT VISIT: 1 Visit         Richerd Lipoma, PT  Acute Rehab Services Secure chat preferred Office [phone]   Richerd CROME Deshante Cassell 03/07/2024, 3:36 PM

## 2024-03-07 NOTE — Progress Notes (Signed)
 PHARMACY - ANTICOAGULATION CONSULT NOTE  Pharmacy Consult for Heparin (Xarelto  on hold) Indication: atrial fibrillation  Allergies  Allergen Reactions   Celecoxib Other (See Comments)    Ineffective   Penicillin G Sodium Nausea Only   Naprosyn [Naproxen] Rash    Vital Signs: Temp: 98.7 F (37.1 C) (11/04 0931) Temp Source: Oral (11/04 0537) BP: 103/77 (11/04 0931) Pulse Rate: 79 (11/04 0931)  Labs: Recent Labs    03/06/24 2040 03/07/24 0009 03/07/24 0405 03/07/24 1418  HGB  --  11.3*  --   --   HCT  --  37.0*  --   --   PLT  --  377  --   --   APTT  --   --   --  139*  HEPARINUNFRC  --   --   --  >1.10*  CREATININE 2.65*  --  2.48*  --     Estimated Creatinine Clearance: 30.1 mL/min (A) (by C-G formula based on SCr of 2.48 mg/dL (H)).   Medical History: Past Medical History:  Diagnosis Date   Arthritis    Chronic back pain    Coronary artery disease    40% mLAD/DIAG bifurcation 03/25/10    Dysrhythmia    History of atrial fibrillation   Gout    H/O hiatal hernia    Hearing decreased    right ear   History of bladder infections    History of bleeding ulcers    History of ulcer disease    Hypercholesteremia    Nocturia    Obstructive sleep apnea hypopnea, severe    Sleep apnea    cpap sleep study 2011   Assessment: 83 y/o M from outside facility with gangrenous toe. On Xarelto  PTA for afib, holding Xarelto  and starting heparin. Per RN, last dose of Xarelto  was >24 hours ago, will start heparin now. Anticipate using aPTT to dose. Above labs reviewed.   11/4 PM update: HL: >1.10 [last dose Xarelto  11/2 16:19] aPTT: 139 Hgb: 11.3 PLT: wnl No signs of bleeding / pauses w/ gtt per nursing  Goal of Therapy:  Heparin level 0.3-0.7 units/ml aPTT 66-102 seconds Monitor platelets by anticoagulation protocol: Yes   Plan:  Hold heparin gtt for 1 hour restart heparin drip at 1100 units/hr  aPTT in 8 hours Daily CBC/Heparin level/aPTT Monitor for  bleeding   Benedetta Heath BS, PharmD, BCPS Clinical Pharmacist 03/07/2024 2:42 PM  Contact: [phone] after 3 PM

## 2024-03-08 ENCOUNTER — Encounter (HOSPITAL_COMMUNITY)
Admission: EM | Disposition: A | Discharge status: Skilled Nursing Facility | Payer: Self-pay | Source: Home / Self Care | Attending: Internal Medicine

## 2024-03-08 DIAGNOSIS — I509 Heart failure, unspecified: Secondary | ICD-10-CM | POA: Diagnosis not present

## 2024-03-08 DIAGNOSIS — I70261 Atherosclerosis of native arteries of extremities with gangrene, right leg: Secondary | ICD-10-CM

## 2024-03-08 DIAGNOSIS — I96 Gangrene, not elsewhere classified: Secondary | ICD-10-CM | POA: Diagnosis not present

## 2024-03-08 HISTORY — PX: LOWER EXTREMITY ANGIOGRAPHY: CATH118251

## 2024-03-08 HISTORY — PX: ABDOMINAL AORTOGRAM: CATH118222

## 2024-03-08 LAB — BASIC METABOLIC PANEL WITH GFR
Anion gap: 16 — ABNORMAL HIGH (ref 5–15)
BUN: 37 mg/dL — ABNORMAL HIGH (ref 8–23)
CO2: 26 mmol/L (ref 22–32)
Calcium: 8.5 mg/dL — ABNORMAL LOW (ref 8.9–10.3)
Chloride: 97 mmol/L — ABNORMAL LOW (ref 98–111)
Creatinine, Ser: 2.16 mg/dL — ABNORMAL HIGH (ref 0.61–1.24)
GFR, Estimated: 29 mL/min — ABNORMAL LOW (ref >60)
Glucose, Bld: 83 mg/dL (ref 70–99)
Potassium: 3.8 mmol/L (ref 3.5–5.1)
Sodium: 139 mmol/L (ref 135–145)

## 2024-03-08 LAB — CBC
HCT: 34.7 % — ABNORMAL LOW (ref 39.0–52.0)
Hemoglobin: 10.9 g/dL — ABNORMAL LOW (ref 13.0–17.0)
MCH: 29.8 pg (ref 26.0–34.0)
MCHC: 31.4 g/dL (ref 30.0–36.0)
MCV: 94.8 fL (ref 80.0–100.0)
Platelets: 325 K/uL (ref 150–400)
RBC: 3.66 MIL/uL — ABNORMAL LOW (ref 4.22–5.81)
RDW: 15.8 % — ABNORMAL HIGH (ref 11.5–15.5)
WBC: 6.3 K/uL (ref 4.0–10.5)
nRBC: 0 % (ref 0.0–0.2)

## 2024-03-08 LAB — HEPARIN LEVEL (UNFRACTIONATED)
Heparin Unfractionated: >1.1 [IU]/mL — ABNORMAL HIGH (ref 0.30–0.70)
Heparin Unfractionated: >1.1 [IU]/mL — ABNORMAL HIGH (ref 0.30–0.70)

## 2024-03-08 LAB — GLUCOSE, CAPILLARY: Glucose-Capillary: 84 mg/dL (ref 70–99)

## 2024-03-08 LAB — APTT
aPTT: 70 s — ABNORMAL HIGH (ref 24–36)
aPTT: 89 s — ABNORMAL HIGH (ref 24–36)

## 2024-03-08 MED ORDER — IODIXANOL 320 MG/ML IV SOLN
INTRAVENOUS | Status: DC | PRN
  Administered 2024-03-08: 20 mL via INTRA_ARTERIAL

## 2024-03-08 MED ORDER — LIDOCAINE HCL (PF) 1 % IJ SOLN
INTRAMUSCULAR | Status: AC
  Filled 2024-03-08: qty 30

## 2024-03-08 MED ORDER — MIDAZOLAM HCL (PF) 2 MG/2ML IJ SOLN
INTRAMUSCULAR | Status: DC | PRN
  Administered 2024-03-08: .5 mg via INTRAVENOUS

## 2024-03-08 MED ORDER — SODIUM CHLORIDE 0.9% FLUSH: 3.0000 mL | INTRAVENOUS | Status: DC | PRN

## 2024-03-08 MED ORDER — LABETALOL HCL 5 MG/ML IV SOLN: 10.0000 mg | INTRAVENOUS | Status: DC | PRN

## 2024-03-08 MED ORDER — HYDRALAZINE HCL 20 MG/ML IJ SOLN: 5.0000 mg | INTRAMUSCULAR | Status: DC | PRN

## 2024-03-08 MED ORDER — FENTANYL CITRATE (PF) 100 MCG/2ML IJ SOLN
INTRAMUSCULAR | Status: AC
  Filled 2024-03-08: qty 2

## 2024-03-08 MED ORDER — SODIUM CHLORIDE 0.9% FLUSH
3.0000 mL | Freq: Two times a day (BID) | INTRAVENOUS | Status: DC
  Administered 2024-03-09 – 2024-03-13 (×9): 3 mL via INTRAVENOUS

## 2024-03-08 MED ORDER — SODIUM CHLORIDE 0.9 % WEIGHT BASED INFUSION
1.0000 mL/kg/h | INTRAVENOUS | Status: AC
  Administered 2024-03-08 (×2): 1 mL/kg/h via INTRAVENOUS

## 2024-03-08 MED ORDER — MIDAZOLAM HCL 2 MG/2ML IJ SOLN
INTRAMUSCULAR | Status: AC
  Filled 2024-03-08: qty 2

## 2024-03-08 MED ORDER — HEPARIN (PORCINE) 25000 UT/250ML-% IV SOLN
1200.0000 [IU]/h | INTRAVENOUS | Status: AC
  Administered 2024-03-09: 1100 [IU]/h via INTRAVENOUS

## 2024-03-08 MED ORDER — FENTANYL CITRATE (PF) 100 MCG/2ML IJ SOLN
INTRAMUSCULAR | Status: DC | PRN
  Administered 2024-03-08: 25 ug via INTRAVENOUS

## 2024-03-08 MED ORDER — SODIUM CHLORIDE 0.9 % IV SOLN: 250.0000 mL | INTRAVENOUS | Status: AC | PRN

## 2024-03-08 MED ORDER — LIDOCAINE HCL (PF) 1 % IJ SOLN
INTRAMUSCULAR | Status: DC | PRN
  Administered 2024-03-08: 5 mL

## 2024-03-08 MED ORDER — HEPARIN (PORCINE) IN NACL 1000-0.9 UT/500ML-% IV SOLN
INTRAVENOUS | Status: DC | PRN
  Administered 2024-03-08 (×2): 500 mL

## 2024-03-08 MED ORDER — SODIUM CHLORIDE 0.9 % IV SOLN: INTRAVENOUS | Status: DC

## 2024-03-08 NOTE — Progress Notes (Signed)
 PHARMACY - ANTICOAGULATION CONSULT NOTE  Pharmacy Consult for Heparin (Xarelto  on hold) Indication: atrial fibrillation  Allergies  Allergen Reactions   Celecoxib Other (See Comments)    Ineffective   Penicillin G Sodium Nausea Only   Naprosyn [Naproxen] Rash    Patient Measurements: Height: 6' (182.9 cm) Weight: 130.3 kg (287 lb 4.2 oz) IBW/kg (Calculated) : 77.6 HEPARIN DW (KG): 105  Vital Signs: Temp: 98 F (36.7 C) (11/05 1400) Temp Source: Oral (11/05 1400) BP: 125/74 (11/05 1500) Pulse Rate: 97 (11/05 1400)  Labs: Recent Labs    03/06/24 2040 03/07/24 0009 03/07/24 0405 03/07/24 1418 03/08/24 0005 03/08/24 0754 03/08/24 0959  HGB  --  11.3*  --   --  10.9*  --   --   HCT  --  37.0*  --   --  34.7*  --   --   PLT  --  377  --   --  325  --   --   APTT  --   --   --  139* 89*  --  70*  HEPARINUNFRC  --   --   --  >1.10* >1.10*  --  >1.10*  CREATININE 2.65*  --  2.48*  --   --  2.16*  --     Estimated Creatinine Clearance: 35.5 mL/min (A) (by C-G formula based on SCr of 2.16 mg/dL (H)).  Assessment: 83 y/o M from outside facility with gangrenous toe. On Xarelto  15 mg daily PTA for afib, holding Xarelto  for procedures and covering with IV heparin. Last dose of Xarelto  11/2 at 1619.  Using aPTTs for monitoring while heparin levels are falsely elevated due to recent Xaretlo doses.  aPTT therapeutic (70 seconds) this am on heparin at 1100 units/hr; heparin level falsely elevated (>1.10).  Heparin drip held at 1145 for angiogram and to resume 12 hours after sheath out.  Sheath out at 12:59.  Mynx closure device used. No bleeding or hematoma reported. CBC stable.   Goal of Therapy:  Heparin level 0.3-0.7 units/ml aPTT 66-102 seconds Monitor platelets by anticoagulation protocol: Yes   Plan:  Resume heparin drip at ~1am on 11/6 at 1100 units/hr. aPTT and heparin level ~8 hrs after drip resumes and daily until correlating; daily CBC. Monitor for signs/symptoms of  bleeding. Follow up for resuming Xarelto  when no further procedures.  Genaro Zebedee Calin, RPh 03/08/2024,4:15 PM

## 2024-03-08 NOTE — Consult Note (Signed)
 ORTHOPAEDIC CONSULTATION  REQUESTING PHYSICIAN: Carlos Reichert, MD  Chief Complaint: Gangrenous right little toe.  HPI: Carlos Hanson is a 83 y.o. male who presents with a gangrenous right little toe.  Patient with a history of tobacco use.  Family history of diabetes.  Past Medical History:  Diagnosis Date   Arthritis    Chronic back pain    Coronary artery disease    40% mLAD/DIAG bifurcation 03/25/10    Dysrhythmia    History of atrial fibrillation   Gout    H/O hiatal hernia    Hearing decreased    right ear   History of bladder infections    History of bleeding ulcers    History of ulcer disease    Hypercholesteremia    Nocturia    Obstructive sleep apnea hypopnea, severe    Sleep apnea    cpap sleep study 2011   Past Surgical History:  Procedure Laterality Date   BACK SURGERY     CARDIAC CATHETERIZATION     2011   CARDIOVERSION N/A 01/24/2024   Procedure: CARDIOVERSION;  Surgeon: Carlos Lonni CROME, MD;  Location: [facility] INVASIVE CV LAB;  Service: Cardiovascular;  Laterality: N/A;   CARDIOVERSION N/A 01/26/2024   Procedure: CARDIOVERSION;  Surgeon: Carlos Soyla LABOR, MD;  Location: [facility] INVASIVE CV LAB;  Service: Cardiovascular;  Laterality: N/A;   CARDIOVERSION N/A 02/01/2024   Procedure: CARDIOVERSION;  Surgeon: Carlos Ezra RAMAN, MD;  Location: [facility] INVASIVE CV LAB;  Service: Cardiovascular;  Laterality: N/A;   cardioverson     COLONOSCOPY  08/27/2011   Procedure: COLONOSCOPY;  Surgeon: Carlos RAYMOND Rivet, MD;  Location: [facility] ENDO SUITE;  Service: Endoscopy;  Laterality: N/A;  930   ESOPHAGOGASTRODUODENOSCOPY     JOINT REPLACEMENT     left shoulder   Left shoulder replacement     LUMBAR LAMINECTOMY  03/15/2012   Procedure: LUMBAR LAMINECTOMY WITH  X-STOP 2 LEVEL;  Surgeon: Carlos Gens, MD;  Location: [facility] NEURO ORS;  Service: Neurosurgery;  Laterality: N/A;  Lumbar two-three, three-four XSTOP   RIGHT HEART CATH N/A 02/01/2024   Procedure: RIGHT HEART CATH;  Surgeon:  Carlos Ezra RAMAN, MD;  Location: [facility] INVASIVE CV LAB;  Service: Cardiovascular;  Laterality: N/A;   TONSILLECTOMY     Social History   Socioeconomic History   Marital status: Married    Spouse name: Carlos Hanson    Number of children: 3   Years of education: college   Highest education level: Not on file  Occupational History    Comment: Retired  Tobacco Use   Smoking status: Former    Current packs/day: 0.00    Average packs/day: 1 pack/day for 25.0 years (25.0 ttl pk-yrs)    Types: Cigarettes    Start date: 07/13/1980    Quit date: 07/13/2005    Years since quitting: 18.6   Smokeless tobacco: Former    Types: Chew   Tobacco comments:    Quit 8 years ago.  Vaping Use   Vaping status: Never Used  Substance and Sexual Activity   Alcohol  use: Yes    Alcohol /week: 2.0 standard drinks of alcohol     Types: 2 Shots of liquor per week    Comment: once a week   Drug use: No   Sexual activity: Not on file  Other Topics Concern   Not on file  Social History Narrative   Patient lives at home with his wife Carlos Hanson).    Retired.   Education college.   Right  handed.   Caffeine one cup of coffee daily.   Social Drivers of Corporate Investment Banker Strain: Not on file  Food Insecurity: No Food Insecurity (03/07/2024)   Hunger Vital Sign    Worried About Running Out of Food in the Last Year: Never true    Ran Out of Food in the Last Year: Never true  Transportation Needs: No Transportation Needs (03/07/2024)   PRAPARE - Administrator, Civil Service (Medical): No    Lack of Transportation (Non-Medical): No  Physical Activity: Not on file  Stress: Not on file  Social Connections: Moderately Isolated (03/07/2024)   Social Connection and Isolation Panel    Frequency of Communication with Friends and Family: More than three times a week    Frequency of Social Gatherings with Friends and Family: More than three times a week    Attends Religious Services: Patient declined     Database Administrator or Organizations: No    Attends Engineer, Structural: Patient declined    Marital Status: Married   Family History  Problem Relation Age of Onset   Heart failure Mother    Diabetes Mother    Arthritis Mother    High blood pressure Mother    Heart failure Father    Colon cancer Neg Hx    Sleep apnea Neg Hx    - negative except otherwise stated in the family history section Allergies  Allergen Reactions   Celecoxib Other (See Comments)    Ineffective   Penicillin G Sodium Nausea Only   Naprosyn [Naproxen] Rash   Prior to Admission medications   Medication Sig Start Date End Date Taking? Authorizing Provider  acetaminophen  (TYLENOL ) 650 MG CR tablet Take 650 mg by mouth every 8 (eight) hours as needed for pain.   Yes [provider]  atorvastatin  (LIPITOR) 20 MG tablet Take 1 tablet by mouth daily. 07/22/18  Yes [provider]  buprenorphine (BUTRANS) 5 MCG/HR PTWK 1 patch once a week. 02/25/24  Yes [provider]  clobetasol (TEMOVATE) 0.05 % external solution Apply 1 Application topically daily as needed (psoriasis). 05/24/23  Yes [provider]  donepezil  (ARICEPT ) 5 MG tablet Take 5 mg by mouth at bedtime. 04/23/23  Yes [provider]  DULoxetine  (CYMBALTA ) 60 MG capsule Take 1 capsule (60 mg total) by mouth 2 (two) times daily. 02/03/24  Yes Arrien, Elidia Sieving, MD  furosemide  (LASIX ) 40 MG tablet Take 40 mg by mouth daily as needed. 01/04/24  Yes [provider]  gabapentin  (NEURONTIN ) 300 MG capsule Take 1 capsule (300 mg total) by mouth 3 (three) times daily. 02/03/24  Yes Arrien, Carlos Daniel, MD  ketoconazole (NIZORAL) 2 % cream Apply 1 Application topically daily as needed for irritation.   Yes [provider]  lactulose  (CHRONULAC ) 10 GM/15ML solution Take 30 mLs (20 g total) by mouth daily as needed for mild constipation. 02/03/24  Yes Arrien, Elidia Sieving, MD   levothyroxine  (SYNTHROID ) 50 MCG tablet Take 50 mcg by mouth daily before breakfast.   Yes [provider]  Melatonin 10 MG TABS Take 10 mg by mouth at bedtime.   Yes [provider]  metoprolol  succinate (TOPROL -XL) 25 MG 24 hr tablet Take 2 tablets (50 mg total) by mouth daily. 02/17/24  Yes Lee, Jordan, NP  Multiple Vitamins-Minerals (PRESERVISION AREDS 2) CAPS Take 2 capsules by mouth 2 (two) times daily.   Yes [provider]  oxymetazoline  (AFRIN) 0.05 %  nasal spray Place 1 spray into both nostrils at bedtime as needed for congestion (sinuses).   Yes [provider]  pantoprazole  (PROTONIX ) 20 MG tablet Take 20 mg by mouth daily.   Yes [provider]  Rivaroxaban  (XARELTO ) 15 MG TABS tablet Take 15 mg by mouth every evening.   Yes [provider]  senna-docusate (SENOKOT-S) 8.6-50 MG tablet Take 1 tablet by mouth at bedtime.   Yes [provider]  torsemide (DEMADEX) 20 MG tablet Take 40 mg by mouth 2 (two) times daily.   Yes [provider]  amiodarone  (PACERONE ) 200 MG tablet Take 1 tablet (200 mg total) by mouth 2 (two) times daily. Take 200 mg twice daily for 10 days, until 10/12 then on 10/13 start taking 200 mg daily. Patient not taking: Reported on 03/07/2024 02/17/24   Lee, Jordan, NP   VAS US  ABI WITH/WO TBI Result Date: 03/07/2024  LOWER EXTREMITY DOPPLER STUDY Patient Name:  RAIHAN KIMMEL  Date of Exam:   03/07/2024 Medical Rec #: [id number]     Accession #:    [id number] Date of Birth: [birth year]      Patient Gender: M Patient Age:   18 years Exam Location:  [facility] Procedure:      VAS US  ABI WITH/WO TBI Referring Phys: LYNWOOD MOULDER --------------------------------------------------------------------------------  Indications: Gangrene. Dry extremity. High Risk         Hyperlipidemia, past history of smoking, coronary artery Factors:          disease.  Comparison Study: No prior exam. Performing  Technologist: Edilia Elden Appl  Examination Guidelines: A complete evaluation includes at minimum, Doppler waveform signals and systolic blood pressure reading at the level of bilateral brachial, anterior tibial, and posterior tibial arteries, when vessel segments are accessible. Bilateral testing is considered an integral part of a complete examination. Photoelectric Plethysmograph (PPG) waveforms and toe systolic pressure readings are included as required and additional duplex testing as needed. Limited examinations for reoccurring indications may be performed as noted.  ABI Findings: +---------+------------------+-----+---------+--------+ Right    Rt Pressure (mmHg)IndexWaveform Comment  +---------+------------------+-----+---------+--------+ Brachial 121                    triphasic         +---------+------------------+-----+---------+--------+ PTA      136               1.10 triphasic         +---------+------------------+-----+---------+--------+ DP       133               1.07 triphasic         +---------+------------------+-----+---------+--------+ Great Toe0                 0.00 Absent            +---------+------------------+-----+---------+--------+ +---------+------------------+-----+---------+-------+ Left     Lt Pressure (mmHg)IndexWaveform Comment +---------+------------------+-----+---------+-------+ Brachial 124                    triphasic        +---------+------------------+-----+---------+-------+ PTA      151               1.22 biphasic         +---------+------------------+-----+---------+-------+ DP       140               1.13 triphasic        +---------+------------------+-----+---------+-------+ Burnetta Inch  0.31 Abnormal         +---------+------------------+-----+---------+-------+ +-------+-----------+-----------+------------+------------+ ABI/TBIToday's ABIToday's TBIPrevious ABIPrevious TBI  +-------+-----------+-----------+------------+------------+ Right  1.10       0.00                                +-------+-----------+-----------+------------+------------+ Left   1.22       0.31                                +-------+-----------+-----------+------------+------------+  Summary: Right: Resting right ankle-brachial index is within normal range. The right toe-brachial index is abnormal.  Left: Resting left ankle-brachial index is within normal range. The left toe-brachial index is abnormal.  *See table(s) above for measurements and observations.  Electronically signed by Penne Colorado MD on 03/07/2024 at 3:36:20 PM.    Final    DG Foot 2 Views Right Result Date: 03/06/2024 CLINICAL DATA:  [id number] Necrotic toes (HCC) [id number] EXAM: RIGHT FOOT - 2 VIEW COMPARISON:  None Available. FINDINGS: No acute fracture or dislocation. There is no evidence of arthropathy or other focal bone abnormality. Extensive soft tissue swelling about the toes and dorsum of the foot. No subcutaneous gas appreciated. IMPRESSION: Extensive soft tissue swelling about the toes and dorsum of the foot. No radiographic findings of osteomyelitis, at this time. Electronically Signed   By: Rogelia Myers M.D.   On: 03/06/2024 18:38   - pertinent xrays, CT, MRI studies were reviewed and independently interpreted  Positive ROS: All other systems have been reviewed and were otherwise negative with the exception of those mentioned in the HPI and as above.  Physical Exam: General: Alert, no acute distress Psychiatric: Patient is competent for consent with normal mood and affect Lymphatic: No axillary or cervical lymphadenopathy Cardiovascular: No pedal edema Respiratory: No cyanosis, no use of accessory musculature GI: No organomegaly, abdomen is soft and non-tender    Images:  @ENCIMAGES @  Labs:  Lab Results  Component Value Date   ESRSEDRATE 118 (H) 01/25/2024   LABURIC 7.7 01/25/2024   REPTSTATUS  PENDING 03/06/2024   CULT  03/06/2024    NO GROWTH 2 DAYS Performed at [facility], [address], [place], [name] [address]    LABORGA KLEBSIELLA PNEUMONIAE (A) 01/21/2024    Lab Results  Component Value Date   ALBUMIN 2.3 (L) 03/06/2024   ALBUMIN 1.6 (L) 01/29/2024   ALBUMIN 2.4 (L) 01/22/2024   LABURIC 7.7 01/25/2024        Latest Ref Rng & Units 03/08/2024   12:05 AM 03/07/2024   12:09 AM 02/17/2024    1:04 PM  CBC EXTENDED  WBC 4.0 - 10.5 K/uL 6.3  7.2  8.9   RBC 4.22 - 5.81 MIL/uL 3.66  3.82  3.72   Hemoglobin 13.0 - 17.0 g/dL 89.0  88.6  88.9   HCT 39.0 - 52.0 % 34.7  37.0  35.4   Platelets 150 - 400 K/uL 325  377  382   NEUT# 1.7 - 7.7 K/uL  3.9    Lymph# 0.7 - 4.0 K/uL  2.0      Neurologic: Patient does not have protective sensation bilateral lower extremities.   MUSCULOSKELETAL:   Skin: Examination patient has a black gangrenous eschar over the right little toe this was removed and patient has complete epithelization of the skin right little toe.  Patient has a palpable dorsalis  pedis pulse.  The ankle-brachial indices shows triphasic flow at the ankle however patient has a toe pressure on the right of 0.  Sed rate 118.  2 view radiograph of the right foot does not show calcified arteries in the foot.  There are no destructive bony changes no evidence of osteomyelitis of the little toe.  Assessment: Assessment: Ischemic changes right little toe with microvascular disease.  Plan: Plan: Patient scheduled for endovascular evaluation today to try to improve his microcirculation.  No surgical intervention indicated at this time for the right little toe.  I will follow-up as needed.  Thank you for the consult and the opportunity to see Mr. Cailan General, MD [facility] [phone] 7:48 AM

## 2024-03-08 NOTE — Progress Notes (Signed)
 PROGRESS NOTE  Carlos Hanson  DOB: October 26, 1940  PCP: Yolande Toribio [name], MD FMW:[id number]  DOA: 03/06/2024  LOS: 1 day  Hospital Day: 3  Subjective: Patient was seen and examined this morning. Lying on bed.  Not in distress per wife and son at bedside. Afebrile, blood pressure in the low normal range, breathing on room air Labs this morning with creatinine better at 2.16 Seen by vascular and orthopedics this morning Planned for endovascular evaluation today to improve his microcirculation  Brief narrative: Carlos Hanson is a 83 y.o. male with PMH significant for obesity, severe OSA, HTN, HLD, paroxysmal A-fib on Xarelto , CHF, chronic venous stasis, CAD, HLD, hiatal hernia, arthritis, chronic back pain, former smoker Patient was living in an independent living facility at [facility] Most recently hospitalized 9/19-10/2 for acute worsening of CHF.  Since then, patient has been living at the SNF part of the facility.   Patient noticed discoloration of his right fifth toe several days ago.  On 11/3, staff could not feel pulse on right dorsalis pedis, and noted necrotic right great toe.  Called EMS and sent him to ED.  In the ED, patient was afebrile, hemodynamically stable, breathing on room air Labs with WC count 8.9, hemoglobin 11, BUN/creatinine 51/2.41 Blood culture sent X-ray right foot showed extensive soft tissue swelling about the toes and dorsum of the foot without any findings of osteomyelitis  Vascular surgery was consulted Started on IV antibiotics Recommended to hold Xarelto  and consult Dr. Harden Admitted to [facility]  Assessment and plan: Gangrene of right fifth toe, POA Suspected peripheral vascular disease Presented with dry gangrene of right fifth toe  seen by vascular surgery, podiatry ABI with abnormal bilateral toe brachial index Vascular surgery plans for endovascular evaluation today to improved with microcirculation Per Dr. Harden, no need of surgical  intervention at this time Blood culture sent from ED Currently on empiric antibiotic coverage with IV Unasyn and IV Zyvox No fever.  WC count, lactic acid normal Recent Labs  Lab 03/06/24 1831 03/07/24 0009 03/08/24 0005  WBC  --  7.2 6.3  LATICACIDVEN 1.2  --   --    AKI on CKD 4 Baseline creatinine less than 2.3  Presented with creatinine elevated to 2.65, suspect prerenal in the setting of dehydration from poor oral intake Gradually improving.  Continue to monitor Monitor urine output Repeat BMP in the morning showed an improvement of creatinine to 2.16.  Back to baseline.  Continue to monitor  Recent Labs    01/29/24 0425 01/30/24 0238 01/31/24 0550 02/01/24 0500 02/02/24 0500 02/03/24 0640 02/17/24 1304 03/06/24 2040 03/07/24 0405 03/08/24 0754  BUN 57* 61* 58* 51* 48* 47* 51* 43* 42* 37*  CREATININE 2.26* 2.36* 2.32* 2.13* 2.02* 2.28* 2.41* 2.65* 2.48* 2.16*  CO2 30 31 31 32 29 30 29 31 28  26   Paroxysmal A-fib On his recent hospitalist, patient failed DC cardioversion.  He was started on amiodarone  load and discharged on 200 mg daily. Per wife, he has had progressive worsening mobility.  Amiodarone  was cited as the of that and hence he has only been getting amiodarone  100 mg instead of 200 mg daily.  It seems Toprol  has been increased to 75 mg daily for that reason.   For now I would continue amiodarone  at 100 mg daily and Toprol  at 75 mg daily Chronically anticoagulated on Xarelto .  Currently on hold in anticipation of procedure On heparin drip in the meantime  Chronic diastolic  CHF HTN Euvolemic.  Blood pressure in normal range Last echo 01/24/2024 showed EF of 55%, normal pulmonary artery pressure PTA meds- Toprol  75 mg daily, torsemide 40 mg twice daily Currently continued on Toprol  75 mg daily.  Torsemide on hold.  CAD, HLD Continue Lipitor.   Xarelto  plan as above  Anemia of CKD GERD/hiatal hernia Hemoglobin slightly low but stable PPI to  continue Recent Labs    02/01/24 0936 02/01/24 0939 02/17/24 1304 03/07/24 0009 03/08/24 0005  HGB 11.6* 11.9* 11.0* 11.3* 10.9*  MCV  --   --  95.2 96.9 94.8   Hypothyroidism Continue Synthroid  50 mcg daily  Obesity 2 Body mass index is 38.96 kg/m. Patient has been advised to make an attempt to improve diet and exercise patterns to aid in weight loss.   OSA Continue nightly CPAP  Osteoarthritis chronic back pain PTA meds- Butrans patch weekly, gabapentin  300 mg 3 times daily, Cymbalta  60 mg twice daily Continue all  Constipation Bowel regimen with scheduled Senokot and as needed MiraLAX   Dementia Aricept  5 mg nightly     Mobility: Will obtain PT eval postprocedure PT Orders: Active   PT Follow up Rec: Skilled Nursing-Short Term Rehab (<3 Hours/Day)03/07/2024 1526   Goals of care   Code Status: Full Code     DVT prophylaxis:  SCDs Start: 03/07/24 0014   Antimicrobials: On Unasyn and Zyvox Fluid: None Consultants: Vascular, orthopedics Family Communication: Wife and son at bedside  Status: Inpatient Level of care:  Telemetry   Patient is from: [facility] SN[facility] Needs to continue in-hospital care: Ongoing workup Anticipated d/c to: Pending clinical course   Diet:  Diet Order             Diet NPO time specified  Diet effective midnight                   Scheduled Meds:  [MAR Hold] amiodarone   100 mg Oral Daily   [MAR Hold] atorvastatin   20 mg Oral Daily   [MAR Hold] buprenorphine  1 patch Transdermal Weekly   [MAR Hold] donepezil   5 mg Oral QHS   [MAR Hold] DULoxetine   60 mg Oral BID   [MAR Hold] gabapentin   300 mg Oral TID   [MAR Hold] levothyroxine   50 mcg Oral QAC breakfast   [MAR Hold] metoprolol  succinate  75 mg Oral Daily   [MAR Hold] multivitamin  1 tablet Oral BID   [MAR Hold] pantoprazole   20 mg Oral Daily   [MAR Hold] senna-docusate  1 tablet Oral QHS    PRN meds: [MAR Hold] acetaminophen , fentaNYL , Heparin (Porcine)  in NaCl, [MAR Hold]  HYDROmorphone  (DILAUDID ) injection, iodixanol, lidocaine  (PF), [MAR Hold] melatonin, midazolam  PF, [MAR Hold] mouth rinse, [MAR Hold] oxyCODONE , [MAR Hold] polyethylene glycol, [MAR Hold] prochlorperazine   Infusions:   sodium chloride  100 mL/hr at 03/08/24 0900   [MAR Hold] ampicillin-sulbactam (UNASYN) IV 3 g (03/08/24 0614)   And   [MAR Hold] linezolid (ZYVOX) IV 600 mg (03/08/24 0957)   heparin Stopped (03/08/24 1145)    Antimicrobials: Anti-infectives (From admission, onward)    Start     Dose/Rate Route Frequency Ordered Stop   03/07/24 1000  [MAR Hold]  linezolid (ZYVOX) IVPB 600 mg        (MAR Hold since Wed 03/08/2024 at 1143.Hold Reason: Transfer to a Procedural area)  Placed in And Linked Group   600 mg 300 mL/hr over 60 Minutes Intravenous Every 12 hours 03/07/24 0329  03/07/24 0600  [MAR Hold]  Ampicillin-Sulbactam (UNASYN) 3 g in sodium chloride  0.9 % 100 mL IVPB        (MAR Hold since Wed 03/08/2024 at 1143.Hold Reason: Transfer to a Procedural area)  Placed in And Linked Group   3 g 200 mL/hr over 30 Minutes Intravenous Every 8 hours 03/07/24 0329     03/06/24 1715  Ampicillin-Sulbactam (UNASYN) 3 g in sodium chloride  0.9 % 100 mL IVPB       Placed in And Linked Group   3 g 200 mL/hr over 30 Minutes Intravenous  Once 03/06/24 1711 03/06/24 2224   03/06/24 1715  linezolid (ZYVOX) IVPB 600 mg       Placed in And Linked Group   600 mg 300 mL/hr over 60 Minutes Intravenous  Once 03/06/24 1711 03/06/24 2224       Objective: Vitals:   03/08/24 1017 03/08/24 1219  BP: 121/70   Pulse: (!) 102   Resp: 16   Temp: 98.2 F (36.8 C)   SpO2: 97% 97%    Intake/Output Summary (Last 24 hours) at 03/08/2024 1326 Last data filed at 03/08/2024 0320 Gross per 24 hour  Intake 1557.58 ml  Output 200 ml  Net 1357.58 ml   Filed Weights   03/07/24 0500 03/08/24 0342 03/08/24 0618  Weight: 123.8 kg 130.5 kg 130.3 kg   Weight change: 6.713  kg Body mass index is 38.96 kg/m.   Physical Exam: General exam: Pleasant, elderly Caucasian male.  Not in pain Skin: No rashes, lesions or ulcers. HEENT: Atraumatic, normocephalic, no obvious bleeding Lungs: Clear to auscultation bilaterally,  CVS: S1, S2, no murmur,   GI/Abd: Soft, nontender, nondistended, bowel sound present,   CNS: Alert, awake, slow to answer but oriented x 3 Psychiatry: Mood appropriate Extremities: No pedal edema, no calf tenderness, right fifth toe gangrenous  Data Review: I have personally reviewed the laboratory data and studies available.  F/u labs ordered Unresulted Labs (From admission, onward)     Start     Ordered   03/08/24 0500  CBC  Daily,   R      03/07/24 1444   Signed and Held  Lipid panel  (Labs - [place] only)  Tomorrow morning,   R        Signed and Held            Signed, Chapman Rota, MD [facility] 03/08/2024

## 2024-03-08 NOTE — Op Note (Signed)
    Patient name: Carlos Hanson MRN: [id number] DOB: 1940/10/06 Sex: male  03/08/2024 Pre-operative Diagnosis: Right lower extremity critical and ischemic tissue loss of the fifth digit Post-operative diagnosis:  Same Surgeon:  Fonda FORBES Rim, MD Procedure Performed: 1.  Ultrasound-guided micropuncture access of the left common femoral artery retrograde fashion 2.  Aortogram 3.  Second order cannulation, right lower extremity angiogram 4.  Third order cannulation, right lower extremity angiogram 5.  Moderate sedation time 42 minutes, contrast volume 20 cc 6.  Mynx closure   Indications: Patient is an 83 year old male who presented to the hospital with gangrenous right fifth toe.  He had a palpable pulse in the foot, however a toe pressure of 0.  After discussing risks and benefits of diagnostic angiography in an effort to define, and possibly improve perfusion for wound healing, [name] elected to proceed  Findings:  Sluggish flow throughout the arterial system indicative of heart failure No flow-limiting stenosis appreciated in the aortoiliac segments bilaterally No flow-limiting stenosis appreciated in the right lower extremity with three-vessel runoff to the foot.  Dorsalis pedis and plantar arteries patent. 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  Pedal arch did not appear to be intact.    Procedure:  The patient was identified in the holding area and taken to room 8.  The patient was then placed supine on the table and prepped and draped in the usual sterile fashion.  A time out was called.  Ultrasound was used to evaluate the left common femoral artery.  It was patent .  A digital ultrasound image was acquired.  A micropuncture needle was used to access the left common femoral artery under ultrasound guidance.  An 018 wire was advanced without resistance and a micropuncture sheath was placed.  The 018 wire was removed and a benson wire was placed.  The micropuncture sheath was exchanged for  a 5 french sheath.  An omniflush catheter was advanced over the wire to the level of L-1.  An abdominal angiogram was obtained using CO2.  Next, using the omniflush catheter and a benson wire, the aortic bifurcation was crossed and the catheter was placed into theright external iliac artery and right runoff was obtained again using CO2.  Due to slow flow, the catheter was then advanced into the superficial femoral artery to aid in tibial imaging. This required a small contrast.   Fonda FORBES Rim MD [facility] Office: [phone]

## 2024-03-08 NOTE — Progress Notes (Signed)
 Patient ID: Carlos Hanson, male   DOB: Apr 16, 1941, 83 y.o.   MRN: [id number] Patient has completed his endovascular study and has inline flow to the foot.  The gangrene to the little toe has resolved, there is good epithelization.  No surgical intervention necessary for the right foot.  I will plan to follow-up in the office in 1 week.

## 2024-03-08 NOTE — Plan of Care (Signed)

## 2024-03-08 NOTE — Plan of Care (Signed)
  Problem: Education: Goal: Knowledge of General Education information will improve Description: Including pain rating scale, medication(s)/side effects and non-pharmacologic comfort measures Outcome: Progressing   Problem: Clinical Measurements: Goal: Ability to maintain clinical measurements within normal limits will improve Outcome: Progressing   Problem: Coping: Goal: Level of anxiety will decrease Outcome: Progressing   

## 2024-03-08 NOTE — Progress Notes (Signed)
 PHARMACY - ANTICOAGULATION CONSULT NOTE  Pharmacy Consult for Heparin (Xarelto  on hold) Indication: atrial fibrillation  Allergies  Allergen Reactions   Celecoxib Other (See Comments)    Ineffective   Penicillin G Sodium Nausea Only   Naprosyn [Naproxen] Rash    Vital Signs: Temp: 98.2 F (36.8 C) (11/05 1017) Temp Source: Oral (11/05 0618) BP: 121/70 (11/05 1017) Pulse Rate: 102 (11/05 1017)  Labs: Recent Labs    03/06/24 2040 03/07/24 0009 03/07/24 0405 03/07/24 1418 03/08/24 0005 03/08/24 0754 03/08/24 0959  HGB  --  11.3*  --   --  10.9*  --   --   HCT  --  37.0*  --   --  34.7*  --   --   PLT  --  377  --   --  325  --   --   APTT  --   --   --  139* 89*  --  70*  HEPARINUNFRC  --   --   --  >1.10* >1.10*  --  >1.10*  CREATININE 2.65*  --  2.48*  --   --  2.16*  --     Estimated Creatinine Clearance: 35.5 mL/min (A) (by C-G formula based on SCr of 2.16 mg/dL (H)).   Medical History: Past Medical History:  Diagnosis Date   Arthritis    Chronic back pain    Coronary artery disease    40% mLAD/DIAG bifurcation 03/25/10    Dysrhythmia    History of atrial fibrillation   Gout    H/O hiatal hernia    Hearing decreased    right ear   History of bladder infections    History of bleeding ulcers    History of ulcer disease    Hypercholesteremia    Nocturia    Obstructive sleep apnea hypopnea, severe    Sleep apnea    cpap sleep study 2011   Assessment: 83 y/o M from outside facility with gangrenous toe. On Xarelto  PTA for afib, holding Xarelto  and starting heparin. Per RN, last dose of Xarelto  was >24 hours ago, will start heparin now. Anticipate using aPTT to dose. Above labs reviewed.   11/5 AM update:  aPTT therapeutic after rate decrease  Goal of Therapy:  Heparin level 0.3-0.7 units/ml aPTT 66-102 seconds Monitor platelets by anticoagulation protocol: Yes   Plan:  Cont heparin at 1100 units/hr Daily CBC/Heparin level/aPTT Monitor for  bleeding   Benedetta Heath BS, PharmD, BCPS Clinical Pharmacist 03/08/2024 11:11 AM  Contact: [phone] after 3 PM

## 2024-03-08 NOTE — Progress Notes (Signed)
 PHARMACY - ANTICOAGULATION CONSULT NOTE  Pharmacy Consult for Heparin (Xarelto  on hold) Indication: atrial fibrillation  Allergies  Allergen Reactions   Celecoxib Other (See Comments)    Ineffective   Penicillin G Sodium Nausea Only   Naprosyn [Naproxen] Rash    Vital Signs: Temp: 97.6 F (36.4 C) (11/04 2246) Temp Source: Oral (11/04 2246) BP: 87/54 (11/04 2246) Pulse Rate: 68 (11/04 2246)  Labs: Recent Labs    03/06/24 2040 03/07/24 0009 03/07/24 0405 03/07/24 1418 03/08/24 0005  HGB  --  11.3*  --   --  10.9*  HCT  --  37.0*  --   --  34.7*  PLT  --  377  --   --  325  APTT  --   --   --  139* 89*  HEPARINUNFRC  --   --   --  >1.10* >1.10*  CREATININE 2.65*  --  2.48*  --   --     Estimated Creatinine Clearance: 30.1 mL/min (A) (by C-G formula based on SCr of 2.48 mg/dL (H)).   Medical History: Past Medical History:  Diagnosis Date   Arthritis    Chronic back pain    Coronary artery disease    40% mLAD/DIAG bifurcation 03/25/10    Dysrhythmia    History of atrial fibrillation   Gout    H/O hiatal hernia    Hearing decreased    right ear   History of bladder infections    History of bleeding ulcers    History of ulcer disease    Hypercholesteremia    Nocturia    Obstructive sleep apnea hypopnea, severe    Sleep apnea    cpap sleep study 2011   Assessment: 83 y/o M from outside facility with gangrenous toe. On Xarelto  PTA for afib, holding Xarelto  and starting heparin. Per RN, last dose of Xarelto  was >24 hours ago, will start heparin now. Anticipate using aPTT to dose. Above labs reviewed.   11/5 AM update:  aPTT therapeutic after rate decrease  Goal of Therapy:  Heparin level 0.3-0.7 units/ml aPTT 66-102 seconds Monitor platelets by anticoagulation protocol: Yes   Plan:  Cont heparin at 1100 units/hr Heparin level and aPTT in 8 hours Daily CBC/Heparin level/aPTT Monitor for bleeding  Lynwood Mckusick, PharmD, BCPS Clinical  Pharmacist Phone: [phone]

## 2024-03-08 NOTE — Progress Notes (Signed)
 PT placed on CPAP at this time.   03/08/24 2000  BiPAP/CPAP/SIPAP  $ Non-Invasive Ventilator  Non-Invasive Vent Initial  $ Face Mask Medium Yes  BiPAP/CPAP/SIPAP Pt Type Adult  BiPAP/CPAP/SIPAP Resmed  Mask Type Full face mask  Dentures removed? Not applicable  Mask Size Medium  Pressure Support 7 cmH20  FiO2 (%) 21 %  Flow Rate 0 lpm  Patient Home Machine No  Patient Home Mask No  Patient Home Tubing No  Auto Titrate No  Nasal massage performed Yes  CPAP/SIPAP surface wiped down Yes  Device Plugged into RED Power Outlet Yes

## 2024-03-08 NOTE — Progress Notes (Addendum)
 Mobility Specialist Progress Note:    03/08/24 1059  Mobility  Activity Stood at bedside (x3)  Level of Assistance Maximum assist, patient does 25-49% (+2)  Assistive Device Stedy  Activity Response Tolerated well  Mobility Referral Yes  Mobility visit 1 Mobility  Mobility Specialist Start Time (ACUTE ONLY) 1028  Mobility Specialist Stop Time (ACUTE ONLY) 1040  Mobility Specialist Time Calculation (min) (ACUTE ONLY) 12 min   Received pt in bed and agreeable to mobility. Pt required MaxA +2 w/ stedy STS. Pt unable to fully clear buttocks. Attempted x3 STS, unable to transfer pt. Pt had BM, MS assisted w/ pericare and linen change. Returned pt supine in bed with alarm on. Personal belongings and call light within reach. NT aware. All needs met.  Lavanda Pollack Mobility Specialist  Please contact via [employer] or  Rehab Office [phone]

## 2024-03-08 NOTE — Progress Notes (Addendum)
 [facility]  Subjective  -no complaints   Objective 119/76 93 (!) 97.4 F (36.3 C) (Oral) 18 96%  Intake/Output Summary (Last 24 hours) at 03/08/2024 0842 Last data filed at 03/08/2024 0320 Gross per 24 hour  Intake 1897.58 ml  Output 200 ml  Net 1697.58 ml    Bilateral femoral pulses palpable Right PT palpable    Laboratory Lab Results: Recent Labs    03/07/24 0009 03/08/24 0005  WBC 7.2 6.3  HGB 11.3* 10.9*  HCT 37.0* 34.7*  PLT 377 325   BMET Recent Labs    03/07/24 0405 03/08/24 0754  NA 140 139  K 3.7 3.8  CL 98 97*  CO2 28 26  GLUCOSE 87 83  BUN 42* 37*  CREATININE 2.48* 2.16*  CALCIUM  8.5* 8.5*    COAG Lab Results  Component Value Date   INR 3.7 (H) 07/11/2023   INR 1.00 09/02/2010   INR 1.26 05/01/2010   No results found for: PTT  Assessment/Planning:  83 year old male admitted with dry gangrene of right fifth toe.  Plan aortogram, lower extremity arteriogram with a focus on the right leg today given toe pressure of 0.  Discussed indications to optimize inflow and ensure he is maximally revascularized for limb salvage.  NPO.  Consent order placed.  All questions answered.  Lonni JINNY Gaskins 03/08/2024 8:42 AM --

## 2024-03-09 ENCOUNTER — Encounter (HOSPITAL_COMMUNITY): Payer: Self-pay | Admitting: Vascular Surgery

## 2024-03-09 DIAGNOSIS — I480 Paroxysmal atrial fibrillation: Secondary | ICD-10-CM | POA: Diagnosis not present

## 2024-03-09 DIAGNOSIS — I70261 Atherosclerosis of native arteries of extremities with gangrene, right leg: Secondary | ICD-10-CM | POA: Diagnosis not present

## 2024-03-09 DIAGNOSIS — N189 Chronic kidney disease, unspecified: Secondary | ICD-10-CM

## 2024-03-09 DIAGNOSIS — I5032 Chronic diastolic (congestive) heart failure: Secondary | ICD-10-CM | POA: Diagnosis not present

## 2024-03-09 DIAGNOSIS — Z48812 Encounter for surgical aftercare following surgery on the circulatory system: Secondary | ICD-10-CM | POA: Diagnosis not present

## 2024-03-09 DIAGNOSIS — I96 Gangrene, not elsewhere classified: Secondary | ICD-10-CM | POA: Diagnosis not present

## 2024-03-09 LAB — LIPID PANEL
Cholesterol: 87 mg/dL (ref 0–200)
HDL: 61 mg/dL (ref >40)
LDL Cholesterol: 19 mg/dL (ref 0–99)
Total CHOL/HDL Ratio: 1.4 ratio
Triglycerides: 34 mg/dL (ref <150)
VLDL: 7 mg/dL (ref 0–40)

## 2024-03-09 LAB — BASIC METABOLIC PANEL WITH GFR
Anion gap: 16 — ABNORMAL HIGH (ref 5–15)
BUN: 33 mg/dL — ABNORMAL HIGH (ref 8–23)
CO2: 23 mmol/L (ref 22–32)
Calcium: 8.5 mg/dL — ABNORMAL LOW (ref 8.9–10.3)
Chloride: 102 mmol/L (ref 98–111)
Creatinine, Ser: 2.11 mg/dL — ABNORMAL HIGH (ref 0.61–1.24)
GFR, Estimated: 30 mL/min — ABNORMAL LOW (ref >60)
Glucose, Bld: 75 mg/dL (ref 70–99)
Potassium: 4.3 mmol/L (ref 3.5–5.1)
Sodium: 141 mmol/L (ref 135–145)

## 2024-03-09 LAB — CBC
HCT: 35.4 % — ABNORMAL LOW (ref 39.0–52.0)
Hemoglobin: 10.9 g/dL — ABNORMAL LOW (ref 13.0–17.0)
MCH: 29.5 pg (ref 26.0–34.0)
MCHC: 30.8 g/dL (ref 30.0–36.0)
MCV: 95.9 fL (ref 80.0–100.0)
Platelets: 331 K/uL (ref 150–400)
RBC: 3.69 MIL/uL — ABNORMAL LOW (ref 4.22–5.81)
RDW: 15.9 % — ABNORMAL HIGH (ref 11.5–15.5)
WBC: 7.9 K/uL (ref 4.0–10.5)
nRBC: 0 % (ref 0.0–0.2)

## 2024-03-09 LAB — APTT: aPTT: 61 s — ABNORMAL HIGH (ref 24–36)

## 2024-03-09 LAB — HEPARIN LEVEL (UNFRACTIONATED): Heparin Unfractionated: 0.7 [IU]/mL (ref 0.30–0.70)

## 2024-03-09 MED ORDER — ATORVASTATIN CALCIUM 40 MG PO TABS
40.0000 mg | ORAL_TABLET | Freq: Every day | ORAL | Status: DC
  Administered 2024-03-09 – 2024-03-13 (×5): 40 mg via ORAL
  Filled 2024-03-09 (×5): qty 1

## 2024-03-09 MED ORDER — AMIODARONE HCL 200 MG PO TABS
100.0000 mg | ORAL_TABLET | Freq: Every day | ORAL | Status: DC
  Administered 2024-03-09 – 2024-03-13 (×5): 100 mg via ORAL
  Filled 2024-03-09 (×5): qty 1

## 2024-03-09 MED ORDER — RIVAROXABAN 15 MG PO TABS
15.0000 mg | ORAL_TABLET | Freq: Every day | ORAL | Status: DC
  Administered 2024-03-09 – 2024-03-12 (×4): 15 mg via ORAL
  Filled 2024-03-09 (×5): qty 1

## 2024-03-09 MED ORDER — METOPROLOL SUCCINATE ER 25 MG PO TB24
25.0000 mg | ORAL_TABLET | Freq: Every day | ORAL | Status: DC
  Administered 2024-03-09 – 2024-03-13 (×5): 25 mg via ORAL
  Filled 2024-03-09 (×5): qty 1

## 2024-03-09 NOTE — Progress Notes (Signed)
 PROGRESS NOTE  Carlos Hanson  DOB: 12-28-1940  PCP: Yolande Toribio [name], MD FMW:[id number]  DOA: 03/06/2024  LOS: 2 days  Hospital Day: 4  Subjective: Patient was seen and examined this morning. Pleasant elderly Caucasian male.  Lying on bed.  Not in distress. Family not at bedside I called and discussed with his wife on the phone.  Afebrile, hemodynamically stable with heart rate in 90s and blood pressure 90s as well Labs from this morning with BUNs/creatinine 33/2.11  Brief narrative: Carlos Hanson is a 83 y.o. male with PMH significant for obesity, severe OSA, HTN, HLD, paroxysmal A-fib on Xarelto , CHF, chronic venous stasis, CAD, HLD, hiatal hernia, arthritis, chronic back pain, former smoker Patient was living in an independent living facility at [facility] Most recently hospitalized 9/19-10/2 for acute worsening of CHF.  Since then, patient has been living at the SNF part of the facility.   Patient noticed discoloration of his right fifth toe several days ago.  On 11/3, staff could not feel pulse on right dorsalis pedis, and noted necrotic right great toe. Called EMS and sent him to ED.  In the ED, patient was afebrile, hemodynamically stable, breathing on room air Labs with WC count 8.9, hemoglobin 11, BUN/creatinine 51/2.41 Blood culture sent X-ray right foot showed extensive soft tissue swelling about the toes and dorsum of the foot without any findings of osteomyelitis  Vascular surgery was consulted Started on IV antibiotics Recommended to hold Xarelto  and consult Dr. Harden Admitted to [facility] ABI with abnormal bilateral toe brachial index  Assessment and plan: Gangrene of right fifth toe, POA Suspected peripheral vascular disease Presented with dry gangrene of right fifth toe  seen by vascular surgery, podiatry Imaging and ABI as above 11/5, underwent angiogram via left CFA.  No flow-limiting stenosis in the RLE with three-vessel runoff to the foot with DP and  plantar arteries patent.  No stenting done. Per Dr. Harden, no need of surgical intervention at this time and can follow-up in the office Blood culture sent from ED did not show any growth Currently on empiric antibiotic coverage with IV Unasyn and IV Zyvox Given the absence of fever and leukocytosis, patient does not need further antibiotic treatment. Discussed with Dr. Harden Recent Labs  Lab 03/06/24 1831 03/07/24 0009 03/08/24 0005 03/09/24 0639  WBC  --  7.2 6.3 7.9  LATICACIDVEN 1.2  --   --   --    AKI on CKD 4 Baseline creatinine less than 2.3  Presented with creatinine elevated to 2.65, suspect prerenal in the setting of dehydration from poor oral intake Gradually improving.  Trend as below  Recent Labs    01/30/24 0238 01/31/24 0550 02/01/24 0500 02/02/24 0500 02/03/24 0640 02/17/24 1304 03/06/24 2040 03/07/24 0405 03/08/24 0754 03/09/24 0639  BUN 61* 58* 51* 48* 47* 51* 43* 42* 37* 33*  CREATININE 2.36* 2.32* 2.13* 2.02* 2.28* 2.41* 2.65* 2.48* 2.16* 2.11*  CO2 31 31 32 29 30 29 31 28 26  23   Paroxysmal A-fib Seen by EP in the past.  In the recent admission seen by heart failure services. Per note from Dr. Rolan from 10/2, patient failed DCCV X 2, last on 9/30.  Not a candidate for AVJ + PPM on his recent hospitalist, patient failed DC cardioversion.  He was started on amiodarone  load and discharged on 200 mg daily with a plan to stop amiodarone  if remains in A-fib.  Patient was also kept on Toprol -XL 25 mg daily  Per patient's wife, at the rehab, patient had weakness and tremors.  Amiodarone  was cited as the of that and hence the dose was reduced to 100 mg daily.  At the same time, Toprol  dose was increased to 75 mg daily.  While in the hospital, patient has been receiving amiodarone  100 mg daily.  Toprol  75 mg daily has been ordered but patient did not receive that yesterday or today because of heart rate in 90s and 100s.   After discussion with patient's wife today,  I have stop amiodarone . I have reduced Toprol  to 25 mg daily to be started from today.  At her request, I have consulted cardiology. Chronically anticoagulated on Xarelto .  It was switched to heparin drip on admission.  Since, no surgery is planned, switch back to Xarelto  today.  Chronic diastolic CHF HTN Euvolemic.  Blood pressure in normal range Last echo 01/24/2024 showed EF of 55%, normal pulmonary artery pressure PTA meds- Toprol  75 mg daily, torsemide 40 mg twice daily Has not received both the last 2 days.  Heart rate in 100s, blood pressure 90s and 100s. Toprol  25 mg resumed as stated above.  Euvolemic.  Continue to hold diuretics  CAD, HLD Continue Lipitor.   Xarelto  plan as above  Anemia of CKD GERD/hiatal hernia Hemoglobin slightly low but stable PPI to continue Recent Labs    02/01/24 0939 02/17/24 1304 03/07/24 0009 03/08/24 0005 03/09/24 0639  HGB 11.9* 11.0* 11.3* 10.9* 10.9*  MCV  --  95.2 96.9 94.8 95.9   Hypothyroidism Continue Synthroid  50 mcg daily  Obesity 2 Body mass index is 38.06 kg/m. Patient has been advised to make an attempt to improve diet and exercise patterns to aid in weight loss.   OSA Continue nightly CPAP  Osteoarthritis chronic back pain PTA meds- Butrans patch weekly, gabapentin  300 mg 3 times daily, Cymbalta  60 mg twice daily Continue all  Constipation Bowel regimen with scheduled Senokot and as needed MiraLAX   Dementia Aricept  5 mg nightly     Mobility: Seen by PT PT Orders: Active   PT Follow up Rec: Skilled Nursing-Short Term Rehab (<3 Hours/Day)03/07/2024 1526   Goals of care   Code Status: Full Code     DVT prophylaxis:  SCDs Start: 03/07/24 0014 Rivaroxaban  (XARELTO ) tablet 15 mg   Antimicrobials: Can stop antibiotics Fluid: None Consultants: Vascular, orthopedics Family Communication: Wife on the phone  Status: Inpatient Level of care:  Progressive Cardiac   Patient is from: [facility]  SNF Needs to continue in-hospital care: Ongoing workup Anticipated d/c to: Pending clinical course.  Back to SNF when stabilized   Diet:  Diet Order             Diet regular Room service appropriate? Yes; Fluid consistency: Thin  Diet effective now                   Scheduled Meds:  atorvastatin   40 mg Oral Daily   [START ON 03/11/2024] buprenorphine  1 patch Transdermal Weekly   donepezil   5 mg Oral QHS   DULoxetine   60 mg Oral BID   gabapentin   300 mg Oral TID   levothyroxine   50 mcg Oral QAC breakfast   metoprolol  succinate  25 mg Oral Daily   multivitamin  1 tablet Oral BID   pantoprazole   20 mg Oral Daily   rivaroxaban   15 mg Oral Q supper   senna-docusate  1 tablet Oral QHS   sodium chloride  flush  3 mL Intravenous  Q12H    PRN meds: sodium chloride , acetaminophen , hydrALAZINE, HYDROmorphone  (DILAUDID ) injection, labetalol, melatonin, mouth rinse, oxyCODONE , polyethylene glycol, prochlorperazine, sodium chloride  flush   Infusions:   sodium chloride  100 mL/hr at 03/08/24 1131   sodium chloride      heparin 1,200 Units/hr (03/09/24 1237)    Antimicrobials: Anti-infectives (From admission, onward)    Start     Dose/Rate Route Frequency Ordered Stop   03/07/24 1000  linezolid (ZYVOX) IVPB 600 mg  Status:  Discontinued       Placed in And Linked Group   600 mg 300 mL/hr over 60 Minutes Intravenous Every 12 hours 03/07/24 0329 03/09/24 1257   03/07/24 0600  Ampicillin-Sulbactam (UNASYN) 3 g in sodium chloride  0.9 % 100 mL IVPB  Status:  Discontinued       Placed in And Linked Group   3 g 200 mL/hr over 30 Minutes Intravenous Every 8 hours 03/07/24 0329 03/09/24 1257   03/06/24 1715  Ampicillin-Sulbactam (UNASYN) 3 g in sodium chloride  0.9 % 100 mL IVPB       Placed in And Linked Group   3 g 200 mL/hr over 30 Minutes Intravenous  Once 03/06/24 1711 03/06/24 2224   03/06/24 1715  linezolid (ZYVOX) IVPB 600 mg       Placed in And Linked Group   600  mg 300 mL/hr over 60 Minutes Intravenous  Once 03/06/24 1711 03/06/24 2224       Objective: Vitals:   03/09/24 0749 03/09/24 1200  BP: 95/77 100/71  Pulse: 99 (!) 106  Resp: 18 18  Temp: 98 F (36.7 C) (!) 97.4 F (36.3 C)  SpO2: 97% 97%    Intake/Output Summary (Last 24 hours) at 03/09/2024 1324 Last data filed at 03/09/2024 0855 Gross per 24 hour  Intake 2045.35 ml  Output 800 ml  Net 1245.35 ml   Filed Weights   03/08/24 0342 03/08/24 0618 03/09/24 0509  Weight: 130.5 kg 130.3 kg 127.3 kg   Weight change: -3.2 kg Body mass index is 38.06 kg/m.   Physical Exam: General exam: Pleasant, elderly Caucasian male.  Not in pain Skin: No rashes, lesions or ulcers. HEENT: Atraumatic, normocephalic, no obvious bleeding Lungs: Clear to auscultation bilaterally,  CVS: A-fib with mild tachycardia, S1, S2, no murmur,   GI/Abd: Soft, nontender, nondistended, bowel sound present,   CNS: Alert, awake, slow to answer but oriented x 3 Psychiatry: Mood appropriate Extremities: No pedal edema, no calf tenderness, right fifth toe gangrenous  Data Review: I have personally reviewed the laboratory data and studies available.  F/u labs ordered Unresulted Labs (From admission, onward)     Start     Ordered   03/08/24 0500  CBC  Daily,   R      03/07/24 1444            Signed, Chapman Rota, MD [facility] 03/09/2024

## 2024-03-09 NOTE — Consult Note (Addendum)
 Cardiology Consultation   Patient ID: Carlos Hanson MRN: [id number]; DOB: 05/04/1941  Admit date: 03/06/2024 Date of Consult: 03/09/2024  PCP:  Yolande Toribio [name], MD   [facility] Providers Cardiologist:  Gordy Bergamo, MD     Patient Profile: Carlos Hanson is a 83 y.o. male with a hx of paroxymal atrial fibrillation on Xarelto , CKD IV, OSA on Cpap, chronic HFpEF who is being seen 03/09/2024 for the evaluation of atrial fibrillation at the request of Dr. Arlice.  History of Present Illness: Carlos Hanson is an 83 year old male with past medical history noted above.  He has been followed by Dr. Bergamo as well as Dr. Rolan.  He was admitted 01/2024 with a CHF exacerbation and massive hypervolemia.  Heart rates initially in the 50-70 range but developed atrial fibrillation with RVR.  Started on a diltiazem  drip but transition to IV Amio.  Found to have a UTI and treated with Rocephin .  He was diuresed with IV Lasix .  He underwent unsuccessful DCCV x 2 due to persistent volume overload.  Advanced heart failure was consulted and after further diuresis he was taken for a right heart cath 9/30 with RA 6, PA 29/20 (20), PCW 12, CO/CI 5.58/2.26 without inotrope support. Repeat DCCV prior to discharge was initially successful but developed recurrent atrial fibrillation within several hrs.  Recommendations for amiodarone  200 mg twice daily x 10 days and then 200 mg daily. Placed on GDMT and discharged to SNF with a weight of 130.7 pounds.    Seen back in the clinic 10/16 with Jordan Lee, NP.  Reported deconditioning and unable to stand but was working with PT.  Felt that his edema had been improving and was not short of breath.  Of note GDMT was limited by CKD.  Given his weight was up 3 pounds it was recommended to increase his torsemide to 40 mg twice daily, as well as increased Toprol   XL 50 mg daily.  Given that he was volume overloaded it was recommended that he increase his amiodarone  back to 200 mg twice  daily, and continue Xarelto .  Presented to the ED on 11/3 from independent living facility with decreased pulses and discoloration to his right foot.  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  Seen by vascular and underwent angiogram with nonflow-limiting stenosis of the right lower extremity with three-vessel runoff to the foot with PDA and plantar arteries patent. He has been in atrial fibrillation, mostly rates controlled.   Cardiology asked to consult in regards his atrial fibrillation and amiodarone . He reports developing tremor recently while at the rehab facility. It was felt this was related to his amiodarone  and dose was reduced to 100mg  daily. He has remained on this dose during admission, reports no issue with tremors. Toprol  has been reduced in the setting of softer BPs.    Past Medical History:  Diagnosis Date   Arthritis    Chronic back pain    Coronary artery disease    40% mLAD/DIAG bifurcation 03/25/10    Dysrhythmia    History of atrial fibrillation   Gout    H/O hiatal hernia    Hearing decreased    right ear   History of bladder infections    History of bleeding ulcers    History of ulcer disease    Hypercholesteremia    Nocturia    Obstructive sleep apnea hypopnea, severe    Sleep apnea    cpap sleep study 2011  Past Surgical History:  Procedure Laterality Date   ABDOMINAL AORTOGRAM N/A 03/08/2024   Procedure: ABDOMINAL AORTOGRAM;  Surgeon: Lanis Fonda BRAVO, MD;  Location: [facility] INVASIVE CV LAB;  Service: Cardiovascular;  Laterality: N/A;   BACK SURGERY     CARDIAC CATHETERIZATION     2011   CARDIOVERSION N/A 01/24/2024   Procedure: CARDIOVERSION;  Surgeon: Kate Lonni CROME, MD;  Location: [facility] INVASIVE CV LAB;  Service: Cardiovascular;  Laterality: N/A;   CARDIOVERSION N/A 01/26/2024   Procedure: CARDIOVERSION;  Surgeon: Loni Soyla LABOR, MD;  Location: [facility] INVASIVE CV LAB;  Service: Cardiovascular;  Laterality: N/A;   CARDIOVERSION N/A  02/01/2024   Procedure: CARDIOVERSION;  Surgeon: Rolan Ezra RAMAN, MD;  Location: [facility] INVASIVE CV LAB;  Service: Cardiovascular;  Laterality: N/A;   cardioverson     COLONOSCOPY  08/27/2011   Procedure: COLONOSCOPY;  Surgeon: Claudis RAYMOND Rivet, MD;  Location: [facility] ENDO SUITE;  Service: Endoscopy;  Laterality: N/A;  930   ESOPHAGOGASTRODUODENOSCOPY     JOINT REPLACEMENT     left shoulder   Left shoulder replacement     LOWER EXTREMITY ANGIOGRAPHY Right 03/08/2024   Procedure: Lower Extremity Angiography;  Surgeon: Lanis Fonda BRAVO, MD;  Location: [facility] INVASIVE CV LAB;  Service: Cardiovascular;  Laterality: Right;   LUMBAR LAMINECTOMY  03/15/2012   Procedure: LUMBAR LAMINECTOMY WITH  X-STOP 2 LEVEL;  Surgeon: Victory Gens, MD;  Location: [facility] NEURO ORS;  Service: Neurosurgery;  Laterality: N/A;  Lumbar two-three, three-four XSTOP   RIGHT HEART CATH N/A 02/01/2024   Procedure: RIGHT HEART CATH;  Surgeon: Rolan Ezra RAMAN, MD;  Location: [facility] INVASIVE CV LAB;  Service: Cardiovascular;  Laterality: N/A;   TONSILLECTOMY      Scheduled Meds:  atorvastatin   40 mg Oral Daily   [START ON 03/11/2024] buprenorphine  1 patch Transdermal Weekly   donepezil   5 mg Oral QHS   DULoxetine   60 mg Oral BID   gabapentin   300 mg Oral TID   levothyroxine   50 mcg Oral QAC breakfast   metoprolol  succinate  25 mg Oral Daily   multivitamin  1 tablet Oral BID   pantoprazole   20 mg Oral Daily   rivaroxaban   15 mg Oral Q supper   senna-docusate  1 tablet Oral QHS   sodium chloride  flush  3 mL Intravenous Q12H   Continuous Infusions:  sodium chloride  100 mL/hr at 03/08/24 1131   sodium chloride      heparin 1,200 Units/hr (03/09/24 1237)   PRN Meds: sodium chloride , acetaminophen , hydrALAZINE, HYDROmorphone  (DILAUDID ) injection, labetalol, melatonin, mouth rinse, oxyCODONE , polyethylene glycol, prochlorperazine, sodium chloride  flush  Allergies:    Allergies  Allergen Reactions   Celecoxib Other (See Comments)     Ineffective   Penicillin G Sodium Nausea Only   Naprosyn [Naproxen] Rash    Social History:   Social History   Socioeconomic History   Marital status: Married    Spouse name: [name]    Number of children: 3   Years of education: college   Highest education level: Not on file  Occupational History    Comment: Retired  Tobacco Use   Smoking status: Former    Current packs/day: 0.00    Average packs/day: 1 pack/day for 25.0 years (25.0 ttl pk-yrs)    Types: Cigarettes    Start date: 07/13/1980    Quit date: 07/13/2005    Years since quitting: 18.6   Smokeless tobacco: Former    Types: Chew   Tobacco comments:  Quit 8 years ago.  Vaping Use   Vaping status: Never Used  Substance and Sexual Activity   Alcohol  use: Yes    Alcohol /week: 2.0 standard drinks of alcohol     Types: 2 Shots of liquor per week    Comment: once a week   Drug use: No   Sexual activity: Not on file  Other Topics Concern   Not on file  Social History Narrative   Patient lives at home with [name]).    Retired.   Education college.   Right handed.   Caffeine one cup of coffee daily.   Social Drivers of Corporate Investment Banker Strain: Not on file  Food Insecurity: No Food Insecurity (03/07/2024)   Hunger Vital Sign    Worried About Running Out of Food in the Last Year: Never true    Ran Out of Food in the Last Year: Never true  Transportation Needs: No Transportation Needs (03/07/2024)   PRAPARE - Administrator, Civil Service (Medical): No    Lack of Transportation (Non-Medical): No  Physical Activity: Not on file  Stress: Not on file  Social Connections: Moderately Isolated (03/07/2024)   Social Connection and Isolation Panel    Frequency of Communication with Friends and Family: More than three times a week    Frequency of Social Gatherings with Friends and Family: More than three times a week    Attends Religious Services: Patient declined    Doctor, General Practice or Organizations: No    Attends Banker Meetings: Patient declined    Marital Status: Married  Catering Manager Violence: Not At Risk (03/07/2024)   Humiliation, Afraid, Rape, and Kick questionnaire    Fear of Current or Ex-Partner: No    Emotionally Abused: No    Physically Abused: No    Sexually Abused: No    Family History:    Family History  Problem Relation Age of Onset   Heart failure Mother    Diabetes Mother    Arthritis Mother    High blood pressure Mother    Heart failure Father    Colon cancer Neg Hx    Sleep apnea Neg Hx      ROS:  Please see the history of present illness.   All other ROS reviewed and negative.     Physical Exam/Data: Vitals:   03/09/24 0509 03/09/24 0749 03/09/24 1200 03/09/24 1431  BP:  95/77 100/71 102/72  Pulse: (!) 108 99 (!) 106 (!) 109  Resp:  18 18   Temp:  98 F (36.7 C) (!) 97.4 F (36.3 C)   TempSrc:  Oral Oral   SpO2: 93% 97% 97%   Weight: 127.3 kg     Height:        Intake/Output Summary (Last 24 hours) at 03/09/2024 1517 Last data filed at 03/09/2024 0855 Gross per 24 hour  Intake 1224.51 ml  Output 800 ml  Net 424.51 ml      03/09/2024    5:09 AM 03/08/2024    6:18 AM 03/08/2024    3:42 AM  Last 3 Weights  Weight (lbs) 280 lb 10.3 oz 287 lb 4.2 oz 287 lb 11.2 oz  Weight (kg) 127.3 kg 130.3 kg 130.5 kg     Body mass index is 38.06 kg/m.  General:  Well nourished, well developed, in no acute distress HEENT: normal Neck: no JVD Vascular: No carotid bruits; Distal pulses 2+ bilaterally Cardiac:  normal S1, S2;  Irreg Irreg; no murmur  Lungs:  clear to auscultation bilaterally Abd: soft, nontender Ext: no edema Musculoskeletal:  No deformities Skin: warm and dry  Neuro:  no focal abnormalities noted  EKG:  The EKG was personally reviewed and demonstrates:  atrial fibrillation 98bpm Telemetry:  Telemetry was personally reviewed and demonstrates:  atrial fibrillation, rates 80-100s  Relevant  CV Studies:  Echo: 01/2024  IMPRESSIONS     1. Left ventricular ejection fraction, by estimation, is 55%. The left  ventricle has normal function. Left ventricular endocardial border not  optimally defined to evaluate regional wall motion. There is mild  concentric left ventricular hypertrophy. Left   ventricular diastolic parameters are indeterminate.   2. Right ventricular systolic function is mildly reduced. The right  ventricular size is normal. There is normal pulmonary artery systolic  pressure.   3. Right atrial size was mildly dilated.   4. The mitral valve is normal in structure. Trivial mitral valve  regurgitation. No evidence of mitral stenosis.   5. The aortic valve is tricuspid. There is moderate calcification of the  aortic valve. Aortic valve regurgitation is not visualized. No aortic  stenosis is present.   6. Aortic dilatation noted. There is mild dilatation of the aortic root,  measuring 39 mm. There is mild dilatation of the ascending aorta,  measuring 39 mm.   7. A small pericardial effusion is present. The pericardial effusion is  circumferential.   8. The patient was in atrial fibrillation.   9. Technically difficult study with poor acoustic windows. LV function  appears grossly normal.   FINDINGS   Left Ventricle: Left ventricular ejection fraction, by estimation, is  55%. The left ventricle has normal function. Left ventricular endocardial  border not optimally defined to evaluate regional wall motion. Definity   contrast agent was given IV to  delineate the left ventricular endocardial borders. The left ventricular  internal cavity size was normal in size. There is mild concentric left  ventricular hypertrophy. Left ventricular diastolic parameters are  indeterminate.   Right Ventricle: The right ventricular size is normal. Right vetricular  wall thickness was not well visualized. Right ventricular systolic  function is mildly reduced. There is normal  pulmonary artery systolic  pressure. The tricuspid regurgitant velocity  is 2.74 m/s, and with an assumed right atrial pressure of 3 mmHg, the  estimated right ventricular systolic pressure is 33.0 mmHg.   Left Atrium: Left atrial size was normal in size.   Right Atrium: Right atrial size was mildly dilated.   Pericardium: A small pericardial effusion is present. The pericardial  effusion is circumferential.   Mitral Valve: The mitral valve is normal in structure. Mild to moderate  mitral annular calcification. Trivial mitral valve regurgitation. No  evidence of mitral valve stenosis. MV peak gradient, 7.0 mmHg. The mean  mitral valve gradient is 3.0 mmHg.   Tricuspid Valve: The tricuspid valve is normal in structure. Tricuspid  valve regurgitation is trivial.   Aortic Valve: The aortic valve is tricuspid. There is moderate  calcification of the aortic valve. Aortic valve regurgitation is not  visualized. No aortic stenosis is present. Aortic valve mean gradient  measures 3.0 mmHg. Aortic valve peak gradient  measures 5.7 mmHg. Aortic valve area, by VTI measures 4.61 cm.   Pulmonic Valve: The pulmonic valve was not well visualized. Pulmonic valve  regurgitation is not visualized.   Aorta: Aortic dilatation noted. There is mild dilatation of the aortic  root, measuring 39 mm. There is mild  dilatation of the ascending aorta,  measuring 39 mm.   IAS/Shunts: The interatrial septum was not well visualized.   Laboratory Data: High Sensitivity Troponin:  No results for input(s): TROPONINIHS in the last 720 hours.   Chemistry Recent Labs  Lab 03/07/24 0405 03/08/24 0754 03/09/24 0639  NA 140 139 141  K 3.7 3.8 4.3  CL 98 97* 102  CO2 28 26 23   GLUCOSE 87 83 75  BUN 42* 37* 33*  CREATININE 2.48* 2.16* 2.11*  CALCIUM  8.5* 8.5* 8.5*  MG 2.1  --   --   GFRNONAA 25* 29* 30*  ANIONGAP 14 16* 16*    Recent Labs  Lab 03/06/24 2040  PROT 6.1*  ALBUMIN 2.3*  AST 18  ALT  16  ALKPHOS 63  BILITOT 0.8   Lipids  Recent Labs  Lab 03/09/24 0639  CHOL 87  TRIG 34  HDL 61  LDLCALC 19  CHOLHDL 1.4    Hematology Recent Labs  Lab 03/07/24 0009 03/08/24 0005 03/09/24 0639  WBC 7.2 6.3 7.9  RBC 3.82* 3.66* 3.69*  HGB 11.3* 10.9* 10.9*  HCT 37.0* 34.7* 35.4*  MCV 96.9 94.8 95.9  MCH 29.6 29.8 29.5  MCHC 30.5 31.4 30.8  RDW 15.9* 15.8* 15.9*  PLT 377 325 331   Thyroid  No results for input(s): TSH, FREET4 in the last 168 hours.  BNPNo results for input(s): BNP, PROBNP in the last 168 hours.  DDimer No results for input(s): DDIMER in the last 168 hours.  Radiology/Studies:  PERIPHERAL VASCULAR CATHETERIZATION Result Date: 03/08/2024 Images from the original result were not included.   Patient name: Carlos Hanson  MRN: [id number]        DOB: [birth year]            Sex: male  03/08/2024 Pre-operative Diagnosis: Right lower extremity critical and ischemic tissue loss of the fifth digit Post-operative diagnosis:  Same Surgeon:  Fonda FORBES Rim, MD Procedure Performed: 1.  Ultrasound-guided micropuncture access of the left common femoral artery retrograde fashion 2.  Aortogram 3.  Second order cannulation, right lower extremity angiogram 4.  Third order cannulation, right lower extremity angiogram 5.  Moderate sedation time 42 minutes, contrast volume 20 cc 6.  Mynx closure   Indications: Patient is an 83 year old male who presented to the hospital with gangrenous right fifth toe.  He had a palpable pulse in the foot, however a toe pressure of 0.  After discussing risks and benefits of diagnostic angiography in an effort to define, and possibly improve perfusion for wound healing, Carlos Hanson elected to proceed  Findings: Sluggish flow throughout the arterial system indicative of heart failure No flow-limiting stenosis appreciated in the aortoiliac segments bilaterally No flow-limiting stenosis appreciated in the right lower extremity with three-vessel runoff to the foot.   Dorsalis pedis and plantar arteries patent. 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  Pedal arch did not appear to be intact.              Procedure:  The patient was identified in the holding area and taken to room 8.  The patient was then placed supine on the table and prepped and draped in the usual sterile fashion.  A time out was called.  Ultrasound was used to evaluate the left common femoral artery.  It was patent .  A digital ultrasound image was acquired.  A micropuncture needle was used to access the left common femoral artery under ultrasound guidance.  An 018 wire was  advanced without resistance and a micropuncture sheath was placed.  The 018 wire was removed and a benson wire was placed.  The micropuncture sheath was exchanged for a 5 french sheath.  An omniflush catheter was advanced over the wire to the level of L-1.  An abdominal angiogram was obtained using CO2.  Next, using the omniflush catheter and a benson wire, the aortic bifurcation was crossed and the catheter was placed into theright external iliac artery and right runoff was obtained again using CO2.  Due to slow flow, the catheter was then advanced into the superficial femoral artery to aid in tibial imaging. This required a small contrast.   Fonda FORBES Rim MD [facility] Office: [phone]    VAS US  ABI WITH/WO TBI Result Date: 03/07/2024  LOWER EXTREMITY DOPPLER STUDY Patient Name:  Carlos Hanson  Date of Exam:   03/07/2024 Medical Rec #: [id number]     Accession #:    [id number] Date of Birth: [birth year]      Patient Gender: M Patient Age:   48 years Exam Location:  [facility] Procedure:      VAS US  ABI WITH/WO TBI Referring Phys: LYNWOOD MOULDER --------------------------------------------------------------------------------  Indications: Gangrene. Dry extremity. High Risk         Hyperlipidemia, past history of smoking, coronary artery Factors:          disease.  Comparison Study: No prior  exam. Performing Technologist: Edilia Elden Appl  Examination Guidelines: A complete evaluation includes at minimum, Doppler waveform signals and systolic blood pressure reading at the level of bilateral brachial, anterior tibial, and posterior tibial arteries, when vessel segments are accessible. Bilateral testing is considered an integral part of a complete examination. Photoelectric Plethysmograph (PPG) waveforms and toe systolic pressure readings are included as required and additional duplex testing as needed. Limited examinations for reoccurring indications may be performed as noted.  ABI Findings: +---------+------------------+-----+---------+--------+ Right    Rt Pressure (mmHg)IndexWaveform Comment  +---------+------------------+-----+---------+--------+ Brachial 121                    triphasic         +---------+------------------+-----+---------+--------+ PTA      136               1.10 triphasic         +---------+------------------+-----+---------+--------+ DP       133               1.07 triphasic         +---------+------------------+-----+---------+--------+ Great Toe0                 0.00 Absent            +---------+------------------+-----+---------+--------+ +---------+------------------+-----+---------+-------+ Left     Lt Pressure (mmHg)IndexWaveform Comment +---------+------------------+-----+---------+-------+ Brachial 124                    triphasic        +---------+------------------+-----+---------+-------+ PTA      151               1.22 biphasic         +---------+------------------+-----+---------+-------+ DP       140               1.13 triphasic        +---------+------------------+-----+---------+-------+ Great Toe39                0.31 Abnormal         +---------+------------------+-----+---------+-------+ +-------+-----------+-----------+------------+------------+  ABI/TBIToday's ABIToday's TBIPrevious ABIPrevious  TBI +-------+-----------+-----------+------------+------------+ Right  1.10       0.00                                +-------+-----------+-----------+------------+------------+ Left   1.22       0.31                                +-------+-----------+-----------+------------+------------+  Summary: Right: Resting right ankle-brachial index is within normal range. The right toe-brachial index is abnormal.  Left: Resting left ankle-brachial index is within normal range. The left toe-brachial index is abnormal.  *See table(s) above for measurements and observations.  Electronically signed by Penne Colorado MD on 03/07/2024 at 3:36:20 PM.    Final    DG Foot 2 Views Right Result Date: 03/06/2024 CLINICAL DATA:  [id number] Necrotic toes (HCC) [id number] EXAM: RIGHT FOOT - 2 VIEW COMPARISON:  None Available. FINDINGS: No acute fracture or dislocation. There is no evidence of arthropathy or other focal bone abnormality. Extensive soft tissue swelling about the toes and dorsum of the foot. No subcutaneous gas appreciated. IMPRESSION: Extensive soft tissue swelling about the toes and dorsum of the foot. No radiographic findings of osteomyelitis, at this time. Electronically Signed   By: Rogelia Myers M.D.   On: 03/06/2024 18:38     Assessment and Plan:  83 y.o. male with a hx of paroxymal atrial fibrillation on Xarelto , CKD IV, OSA on Cpap, chronic HFpEF who is being seen 03/09/2024 for the evaluation of atrial fibrillation at the request of Dr. Ivory Sellar atrial fibrillation -- known hx of the same, admission 01/2024 with failed DCCV x3 therefore rate control was pursued -- initially on amiodarone  200mg  BID but decreased to 100mg  daily prior to admission in the setting of possible side effect of tremors? -- he has been on amiodarone  100mg  daily since admission, no tremors. Discussed with pharmD, there is a less common sides of tremors with amiodarone  but more so in the setting of long term use.  Given his softer BPs and reduction in Toprol  from 75 to 25mg  daily would favor continued low dose amiodarone  100mg  daily for rate control if possible -- continue Toprol  and titrate up at tolerated -- continue Xarelto    Chronic HFpEF --  Echo 9/25 LVEF 55%. Mild concentric LVH, RV mildly reduced,  mild dilatation of aortic root 39 mm. Mild dilatation of ascending aorta 39 mm. Small pericardial effusion present.  -- does not appear volume overloaded on exam -- on torsemide 40mg  BID pta, would resume at discharge. Continue Toprol   CKD IV -- baseline appears around 2~   Otherwise management per primary  Risk Assessment/Risk Scores:  CHA2DS2-VASc Score = 5   This indicates a 7.2% annual risk of stroke. The patient's score is based upon: CHF History: 1 HTN History: 1 Diabetes History: 0 Stroke History: 0 Vascular Disease History: 1 Age Score: 2 Gender Score: 0   For questions or updates, please contact [facility] Please consult [email or web address] for contact info under   Signed, Manuelita Rummer, NP  03/09/2024 3:17 PM  Patient seen and examined, note reviewed with the signed Advanced Practice Provider. I personally reviewed laboratory data, imaging studies and relevant notes. I independently examined the patient and formulated the important aspects of the plan. I have personally discussed the plan with the patient and/or family. Comments  or changes to the note/plan are indicated below.  Patient seen examined his bedside.  He was awake in bed when I arrived.   Paroxysmal atrial fibrillation Chronic heart failure preserved ejection fraction Morbid obesity OSA Chronic kidney disease stage IV  He is in atrial fibrillation his rate is not well-controlled.  But at this point we are in a tough clinical situation where higher dose of amiodarone  Carlos lead to tremors and blood pressure is marginal cannot increase the beta-blocker and with kidney function digoxin for now is not the  best medication.  As we are moving with a linear approach it would be beneficial to keep him on the current regimen in the setting of his infection as well.  Continue the current dose of Xarelto .  From a chronic heart failure standpoint he is not volume overloaded he is currently on torsemide 40 milligrams twice daily - resume at the time of discharge.   Avoid nephrotoxins    Marzetta Lanza DO, MS [facility] Attending Cardiologist [facility]  [address] [place], [name] [address] [phone] Website: [email or web address]

## 2024-03-09 NOTE — Progress Notes (Signed)
 RN spoke with wife, [name], and gave an update. She was wondering whether or not he would discharge today. [name] requested a call from the MD. RN notified MD, Dahal.

## 2024-03-09 NOTE — Plan of Care (Signed)

## 2024-03-09 NOTE — Progress Notes (Signed)
 PHARMACIST LIPID MONITORING   Carlos Hanson is a 83 y.o. male admitted on 03/06/2024 with PVD.  Pharmacy has been consulted to optimize lipid-lowering therapy with the indication of secondary prevention for clinical ASCVD.  Recent Labs:  Lipid Panel (last 6 months):   Lab Results  Component Value Date   CHOL 75 02/17/2024   TRIG 46 02/17/2024   HDL 48 02/17/2024   CHOLHDL 1.6 02/17/2024   VLDL 9 02/17/2024   LDLCALC 18 02/17/2024    Hepatic function panel (last 6 months):   Lab Results  Component Value Date   AST 18 03/06/2024   ALT 16 03/06/2024   ALKPHOS 63 03/06/2024   BILITOT 0.8 03/06/2024    SCr (since admission):   Serum creatinine: 2.11 mg/dL (H) 88/93/74 9360 Estimated creatinine clearance: 35.9 mL/min (A)  Current therapy and lipid therapy tolerance Current lipid-lowering therapy: atorvastatin  20mg /d Documented or reported allergies or intolerances to lipid-lowering therapies (if applicable): none   Plan:    1.Statin intensity (high intensity recommended for all patients regardless of the LDL):  Add or increase statin to high intensity.  2.Add ezetimibe (if any one of the following):   Not indicated at this time.  3.Refer to lipid clinic:   No  4.Follow-up with:  Primary care provider - Yolande Toribio [name], MD  5.Follow-up labs after discharge:  LDL at goal. Recheck lipid panel in 1 year   Prentice Poisson, PharmD Clinical Pharmacist **Pharmacist phone directory can now be found on [email or web address] (PW [email or web address]).  Listed under [facility].

## 2024-03-09 NOTE — Progress Notes (Signed)
 Physical Therapy Treatment Patient Details Name: Carlos Hanson MRN: [id number] DOB: [birth year] Today's Date: 03/09/2024   History of Present Illness Pt is a 83 y.o. male  who presented on 03/06/24 from [facility] with R 5th toe wound. PMH: CHF, OA, venous stasis insufficiency, CAD, history of bleeding PUD, HLD, OSA, Afib with RVR.    PT Comments  Pt received in supine, agreeable to therapy session with heavy encouragement, but pt defers EOB/OOB mobility due to c/o back pain, RLE pain and fatigue. Pt with good effort for LE exercises with bed in chair posture and able to assist using over head rail to scoot toward [facility] with bed in trend and for transfer from high fowlers to long sitting with side rails and up to maxA. Pt given IS and encouraged ankle pumps and IS use hourly. BP MAP (74) with HOB elevated >50 deg and pt reports intermittent sensation of lightheadedness with HOB elevated. Patient will benefit from continued inpatient follow up therapy, <3 hours/day     If plan is discharge home, recommend the following: Two people to help with walking and/or transfers;A lot of help with bathing/dressing/bathroom;Assistance with cooking/housework;Assist for transportation;Other (comment) (mechanical lift for OOB)   Can travel by private vehicle     No  Equipment Recommendations  None recommended by PT (hospital bed/hoyer lift at SNF)    Recommendations for Other Services       Precautions / Restrictions Precautions Precautions: Fall;Back Precaution Booklet Issued: No Recall of Precautions/Restrictions: Impaired Precaution/Restrictions Comments: c/o chronic back pain -back precs for comfort? Restrictions Weight Bearing Restrictions Per Provider Order: No     Mobility  Bed Mobility Overal bed mobility: Needs Assistance Bed Mobility: Supine to Sit     Supine to sit: Max assist, +2 for physical assistance, Used rails, HOB elevated     General bed mobility comments: elevated  HOB/supine to long sitting and pillow repositioned behind him, pt pulling up on bil side rails. +2 mod/maxA for posterior supine scooting toward HOB with bed in trend, pt able to assist with pulling on overhead rails. Pt defers EOB/OOB after repositioning and BLE strengthening exercises in bed chair posture, c/o fatigue.    Transfers Overall transfer level: Needs assistance                 General transfer comment: pt defers today due to c/o fatigue/pain. Board updated to recommend +2 hoyer for OOB to chair (or therapies may attempt with [facility] standing lift next session if pt able)    Ambulation/Gait               General Gait Details: unable   Stairs             Wheelchair Mobility     Tilt Bed    Modified Rankin (Stroke Patients Only)       Balance Overall balance assessment: Needs assistance Sitting-balance support: Single extremity supported, Feet supported Sitting balance-Leahy Scale: Poor Sitting balance - Comments: posterior lean in long sitting trial with bed in chair posture; c/o fatigue. BP soft but stable with HOB >50 deg and feet down Postural control: Posterior lean     Standing balance comment: pt defers to attempt today                            Communication Communication Communication: Impaired Factors Affecting Communication: Hearing impaired (R ear better than L)  Cognition Arousal: Alert Behavior During Therapy:  WFL for tasks assessed/performed   PT - Cognitive impairments: Memory, Sequencing, Problem solving                       PT - Cognition Comments: Pt aware of previous hospitalization but some difficulty with sequence of events and pt unclear when describing living facility/PLOF, at times contradicting himself. Following commands: Impaired Following commands impaired: Follows one step commands inconsistently, Follows one step commands with increased time    Cueing Cueing Techniques: Verbal cues,  Gestural cues, Tactile cues, Visual cues  Exercises General Exercises - Lower Extremity Ankle Circles/Pumps: AROM, Both, 10 reps, Supine Long Arc Quad: AROM, Both, 15 reps, Other (comment), AAROM (bed in chair posture; AA first few reps to initiate) Hip ABduction/ADduction: AROM, Both, 10 reps, Other (comment) (bed in chair posture) Hip Flexion/Marching: AROM, Both, 15 reps, Other (comment), AAROM (bed in chair posture (AA on RLE>LLE 2/2 weakness)) Other Exercises Other Exercises: IS x 10 reps, pt achieves ~1100-1200, encouraged x5-10 reps hourly    General Comments General comments (skin integrity, edema, etc.): BP 104/65 (74) with bed in upright chair posture, HR 99 bpm, SpO2 96% on RA; RN notified pt has skin tear on RUE near elbow (BP cuff appears to be irritating area, dried blood on cuff, so BP cuff removed); RN placed foam dressing during session.      Pertinent Vitals/Pain Pain Assessment Pain Assessment: Faces Pain Score: 5  Pain Location: back, RLE, generalized Pain Descriptors / Indicators: Discomfort, Grimacing, Guarding Pain Intervention(s): Limited activity within patient's tolerance, Monitored during session, Repositioned    Home Living                          Prior Function            PT Goals (current goals can now be found in the care plan section) Acute Rehab PT Goals Patient Stated Goal: get stronger and less back pain PT Goal Formulation: With patient Time For Goal Achievement: 03/21/24 Progress towards PT goals: Progressing toward goals (slowly)    Frequency    Min 2X/week      PT Plan      Co-evaluation              AM-PAC PT 6 Clicks Mobility   Outcome Measure  Help needed turning from your back to your side while in a flat bed without using bedrails?: A Lot Help needed moving from lying on your back to sitting on the side of a flat bed without using bedrails?: A Lot Help needed moving to and from a bed to a chair  (including a wheelchair)?: Total Help needed standing up from a chair using your arms (e.g., wheelchair or bedside chair)?: Total Help needed to walk in hospital room?: Total Help needed climbing 3-5 steps with a railing? : Total 6 Click Score: 8    End of Session   Activity Tolerance: Patient limited by fatigue;Patient limited by pain (RLE pain) Patient left: in bed;with call bell/phone within reach;with bed alarm set;Other (comment) (heels floated, bed in chair posture) Nurse Communication: Mobility status PT Visit Diagnosis: Muscle weakness (generalized) (M62.81);Difficulty in walking, not elsewhere classified (R26.2);Dizziness and giddiness (R42)     Time: 8296-8272 PT Time Calculation (min) (ACUTE ONLY): 24 min  Charges:    $Therapeutic Exercise: 8-22 mins $Therapeutic Activity: 8-22 mins PT General Charges $$ ACUTE PT VISIT: 1 Visit  Connell SQUIBB., PTA Acute Rehabilitation Services Secure Chat Preferred 9a-5:30pm Office: [phone]    Connell HERO [facility] 03/09/2024, 5:59 PM

## 2024-03-09 NOTE — Progress Notes (Signed)
   03/09/24 2300  BiPAP/CPAP/SIPAP  Reason BIPAP/CPAP not in use Non-compliant   Patient refused use of CPAP.

## 2024-03-09 NOTE — Progress Notes (Signed)
 PHARMACY - ANTICOAGULATION CONSULT NOTE  Pharmacy Consult for Heparin (Xarelto  on hold) Indication: atrial fibrillation  Allergies  Allergen Reactions   Celecoxib Other (See Comments)    Ineffective   Penicillin G Sodium Nausea Only   Naprosyn [Naproxen] Rash    Patient Measurements: Height: 6' (182.9 cm) Weight: 127.3 kg (280 lb 10.3 oz) IBW/kg (Calculated) : 77.6 HEPARIN DW (KG): 105  Vital Signs: Temp: 98 F (36.7 C) (11/06 0749) Temp Source: Oral (11/06 0749) BP: 95/77 (11/06 0749) Pulse Rate: 99 (11/06 0749)  Labs: Recent Labs    03/07/24 0009 03/07/24 0405 03/07/24 1418 03/08/24 0005 03/08/24 0754 03/08/24 0959 03/09/24 0639 03/09/24 0910  HGB 11.3*  --   --  10.9*  --   --  10.9*  --   HCT 37.0*  --   --  34.7*  --   --  35.4*  --   PLT 377  --   --  325  --   --  331  --   APTT  --   --    < > 89*  --  70*  --  61*  HEPARINUNFRC  --   --    < > >1.10*  --  >1.10*  --  0.70  CREATININE  --  2.48*  --   --  2.16*  --  2.11*  --    < > = values in this interval not displayed.    Estimated Creatinine Clearance: 35.9 mL/min (A) (by C-G formula based on SCr of 2.11 mg/dL (H)).  Assessment: 83 y/o M from outside facility with gangrenous toe. On Xarelto  15 mg daily PTA for afib, holding Xarelto  for procedures and covering with IV heparin. Last dose of Xarelto  11/2 at 1619.  Using aPTTs for monitoring while heparin levels are falsely elevated due to recent Xaretlo doses.  Heparin resume s/p angiogram 11/5 -aPTT 61 and below goal on 1100 units/hr -heparin level 0.7 and trend down, likely effect of recent Xarelto   Goal of Therapy:  Heparin level 0.3-0.7 units/ml aPTT 66-102 seconds Monitor platelets by anticoagulation protocol: Yes   Plan:  -Increase heparin to 1200 units/hr -Plans to change Xarelto  tonight and will stop heparin when oral therapy restarts  Prentice Poisson, PharmD Clinical Pharmacist **Pharmacist phone directory can now be found on [email or web address]  (PW [email or web address]).  Listed under [facility].

## 2024-03-09 NOTE — Progress Notes (Addendum)
  Progress Note    03/09/2024 6:56 AM 1 Day Post-Op  Subjective:  no complaints; denies pain in his feet.   afebrile  Vitals:   03/09/24 0319 03/09/24 0509  BP: 93/80   Pulse: 100 (!) 108  Resp: 20   Temp: 97.9 F (36.6 C)   SpO2: 94% 93%    Physical Exam: General:  resting and wakes easily Lungs:  non labored Incisions:  left groin soft without hematoma.    CBC    Component Value Date/Time   WBC 6.3 03/08/2024 0005   RBC 3.66 (L) 03/08/2024 0005   HGB 10.9 (L) 03/08/2024 0005   HGB 14.1 06/27/2018 1209   HCT 34.7 (L) 03/08/2024 0005   HCT 43.2 06/27/2018 1209   PLT 325 03/08/2024 0005   PLT 351 06/27/2018 1209   MCV 94.8 03/08/2024 0005   MCV 91 06/27/2018 1209   MCH 29.8 03/08/2024 0005   MCHC 31.4 03/08/2024 0005   RDW 15.8 (H) 03/08/2024 0005   RDW 13.4 06/27/2018 1209   LYMPHSABS 2.0 03/07/2024 0009   LYMPHSABS 2.0 06/27/2018 1209   MONOABS 0.8 03/07/2024 0009   EOSABS 0.4 03/07/2024 0009   EOSABS 0.3 06/27/2018 1209   BASOSABS 0.1 03/07/2024 0009   BASOSABS 0.0 06/27/2018 1209    BMET    Component Value Date/Time   NA 139 03/08/2024 0754   NA 138 06/27/2018 1209   K 3.8 03/08/2024 0754   CL 97 (L) 03/08/2024 0754   CO2 26 03/08/2024 0754   GLUCOSE 83 03/08/2024 0754   BUN 37 (H) 03/08/2024 0754   BUN 19 06/27/2018 1209   CREATININE 2.16 (H) 03/08/2024 0754   CALCIUM  8.5 (L) 03/08/2024 0754   GFRNONAA 29 (L) 03/08/2024 0754   GFRAA 77 06/27/2018 1209    INR    Component Value Date/Time   INR 3.7 (H) 07/11/2023 0826     Intake/Output Summary (Last 24 hours) at 03/09/2024 0656 Last data filed at 03/09/2024 0509 Gross per 24 hour  Intake 2042.35 ml  Output 800 ml  Net 1242.35 ml      Assessment/Plan:  83 y.o. male is s/p:  Angiogram via left CFA  1 Day Post-Op  -no flow limiting stenosis in the RLE with 3 vessel runoff to the foot with DP and plantar arteries patent.  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  He did have sluggish flow throughout indicative of heart failure.  -creatinine improved this am to 2.16 -Dr. Harden saw pt yesterday and gangrenous 5th toe resolved and no surgery recommended and will f/u with him in a week in his office.      Lucie Apt, PA-C [facility] [phone] 03/09/2024 6:56 AM  I have seen and evaluated the patient. I agree with the PA note as documented above.  Postprocedure day 1 status post right lower extremity angiogram for right fifth toe dry gangrene.  Optimized from a vascular surgery standpoint and no intervention necessary.  Small vessel disease in the foot as we discussed.  Groin looks fine.  Outpatient follow-up with Dr. Harden.  Lonni DOROTHA Gaskins, MD [facility] [place] Office: [phone]

## 2024-03-09 NOTE — TOC Progression Note (Signed)
 Transition of Care [facility]) - Progression Note    Patient Details  Name: Carlos Hanson MRN: [id number] Date of Birth: 04/26/1941  Transition of Care [facility]) CM/SW Contact  [name], [name] Phone Number: 03/09/2024, 1:41 PM  Clinical Narrative:    Called [employer],Tina  requested authorization for SNF([facility]) and PTAR   Montie Louder, MSW, LCSW Clinical Social Worker    Expected Discharge Plan: Skilled Nursing Facility Barriers to Discharge: Insurance Authorization, Continued Medical Work up               Expected Discharge Plan and Services       Living arrangements for the past 2 months: Single Family Home                                       Social Drivers of Health (SDOH) Interventions SDOH Screenings   Food Insecurity: No Food Insecurity (03/07/2024)  Housing: Low Risk  (03/07/2024)  Transportation Needs: No Transportation Needs (03/07/2024)  Utilities: Not At Risk (03/07/2024)  Social Connections: Moderately Isolated (03/07/2024)  Tobacco Use: Medium Risk (03/06/2024)    Readmission Risk Interventions     No data to display

## 2024-03-09 NOTE — Progress Notes (Signed)
 Wife called RN again to ask for update. RN messaged MD, Dahal, to see if he could reach out to pt's wife.

## 2024-03-09 NOTE — Plan of Care (Signed)
  Problem: Education: Goal: Knowledge of General Education information will improve Description: Including pain rating scale, medication(s)/side effects and non-pharmacologic comfort measures Outcome: Progressing   Problem: Health Behavior/Discharge Planning: Goal: Ability to manage health-related needs will improve Outcome: Progressing   Problem: Clinical Measurements: Goal: Will remain free from infection Outcome: Progressing Goal: Respiratory complications will improve Outcome: Progressing   Problem: Nutrition: Goal: Adequate nutrition will be maintained Outcome: Progressing   Problem: Coping: Goal: Level of anxiety will decrease Outcome: Progressing   Problem: Pain Managment: Goal: General experience of comfort will improve and/or be controlled Outcome: Progressing   Problem: Safety: Goal: Ability to remain free from injury will improve Outcome: Progressing   Problem: Skin Integrity: Goal: Risk for impaired skin integrity will decrease Outcome: Progressing   Problem: Activity: Goal: Ability to return to baseline activity level will improve Outcome: Progressing   Problem: Health Behavior/Discharge Planning: Goal: Ability to safely manage health-related needs after discharge will improve Outcome: Progressing

## 2024-03-10 ENCOUNTER — Telehealth (HOSPITAL_COMMUNITY): Payer: Self-pay

## 2024-03-10 DIAGNOSIS — N189 Chronic kidney disease, unspecified: Secondary | ICD-10-CM | POA: Diagnosis not present

## 2024-03-10 DIAGNOSIS — I96 Gangrene, not elsewhere classified: Secondary | ICD-10-CM | POA: Diagnosis not present

## 2024-03-10 DIAGNOSIS — I480 Paroxysmal atrial fibrillation: Secondary | ICD-10-CM | POA: Diagnosis not present

## 2024-03-10 LAB — CBC
HCT: 35.1 % — ABNORMAL LOW (ref 39.0–52.0)
Hemoglobin: 10.8 g/dL — ABNORMAL LOW (ref 13.0–17.0)
MCH: 29.6 pg (ref 26.0–34.0)
MCHC: 30.8 g/dL (ref 30.0–36.0)
MCV: 96.2 fL (ref 80.0–100.0)
Platelets: 350 K/uL (ref 150–400)
RBC: 3.65 MIL/uL — ABNORMAL LOW (ref 4.22–5.81)
RDW: 16.2 % — ABNORMAL HIGH (ref 11.5–15.5)
WBC: 8.6 K/uL (ref 4.0–10.5)
nRBC: 0 % (ref 0.0–0.2)

## 2024-03-10 NOTE — Progress Notes (Signed)
 Progress Note  Patient Name: Carlos Hanson Date of Encounter: 03/10/2024  Primary Cardiologist: Carlos Bergamo, MD   Subjective   Patient seen and examined at his bedside.  Inpatient Medications    Scheduled Meds:  amiodarone   100 mg Oral Daily   atorvastatin   40 mg Oral Daily   [START ON 03/11/2024] buprenorphine  1 patch Transdermal Weekly   donepezil   5 mg Oral QHS   DULoxetine   60 mg Oral BID   gabapentin   300 mg Oral TID   levothyroxine   50 mcg Oral QAC breakfast   metoprolol  succinate  25 mg Oral Daily   multivitamin  1 tablet Oral BID   pantoprazole   20 mg Oral Daily   rivaroxaban   15 mg Oral Q supper   senna-docusate  1 tablet Oral QHS   sodium chloride  flush  3 mL Intravenous Q12H   Continuous Infusions:  sodium chloride  100 mL/hr at 03/08/24 1131   PRN Meds: acetaminophen , hydrALAZINE, HYDROmorphone  (DILAUDID ) injection, labetalol, melatonin, mouth rinse, oxyCODONE , polyethylene glycol, prochlorperazine, sodium chloride  flush   Vital Signs    Vitals:   03/09/24 2117 03/09/24 2343 03/10/24 0435 03/10/24 0833  BP: 97/69 100/70 97/65 97/69   Pulse: (!) 105 (!) 102  (!) 105  Resp: 19 19 18 18   Temp: 97.6 F (36.4 C) (!) 97.5 F (36.4 C) 98.6 F (37 C) 98.1 F (36.7 C)  TempSrc: Oral Oral Oral Oral  SpO2: 93% 94%  93%  Weight:      Height:        Intake/Output Summary (Last 24 hours) at 03/10/2024 0921 Last data filed at 03/10/2024 0437 Gross per 24 hour  Intake 418.84 ml  Output 850 ml  Net -431.16 ml   Filed Weights   03/08/24 0342 03/08/24 0618 03/09/24 0509  Weight: 130.5 kg 130.3 kg 127.3 kg    Telemetry    Atrial fibrillation - Personally Reviewed  ECG     - Personally Reviewed  Physical Exam   General: Comfortable, sleeping open his eyes to voice. Head: Atraumatic, normal size  Eyes: PEERLA, EOMI  Neck: Supple, normal JVD Cardiac: Normal S1, S2; RRR; no murmurs, rubs, or gallops Lungs: Clear to auscultation bilaterally Abd: Soft,  nontender, no hepatomegaly  Ext: warm, no edema Musculoskeletal: No deformities, BUE and BLE strength normal and equal Skin: Warm and dry, no rashes   Neuro: Alert and oriented to person, place, time, and situation, CNII-XII grossly intact, no focal deficits  Psych: Normal mood and affect   Labs    Chemistry Recent Labs  Lab 03/06/24 2040 03/07/24 0405 03/08/24 0754 03/09/24 0639  NA 141 140 139 141  K 3.8 3.7 3.8 4.3  CL 99 98 97* 102  CO2 31 28 26 23   GLUCOSE 100* 87 83 75  BUN 43* 42* 37* 33*  CREATININE 2.65* 2.48* 2.16* 2.11*  CALCIUM  8.7* 8.5* 8.5* 8.5*  PROT 6.1*  --   --   --   ALBUMIN 2.3*  --   --   --   AST 18  --   --   --   ALT 16  --   --   --   ALKPHOS 63  --   --   --   BILITOT 0.8  --   --   --   GFRNONAA 23* 25* 29* 30*  ANIONGAP 11 14 16* 16*     Hematology Recent Labs  Lab 03/08/24 0005 03/09/24 0639 03/10/24 0308  WBC 6.3 7.9  8.6  RBC 3.66* 3.69* 3.65*  HGB 10.9* 10.9* 10.8*  HCT 34.7* 35.4* 35.1*  MCV 94.8 95.9 96.2  MCH 29.8 29.5 29.6  MCHC 31.4 30.8 30.8  RDW 15.8* 15.9* 16.2*  PLT 325 331 350    Cardiac EnzymesNo results for input(s): TROPONINI in the last 168 hours. No results for input(s): TROPIPOC in the last 168 hours.   BNPNo results for input(s): BNP, PROBNP in the last 168 hours.   DDimer No results for input(s): DDIMER in the last 168 hours.   Radiology    PERIPHERAL VASCULAR CATHETERIZATION Result Date: 03/08/2024 Images from the original result were not included.   Patient name: Carlos Hanson  MRN: [id number]        DOB: 28-Jul-1940            Sex: male  03/08/2024 Pre-operative Diagnosis: Right lower extremity critical and ischemic tissue loss of the fifth digit Post-operative diagnosis:  Same Surgeon:  Carlos FORBES Rim, MD Procedure Performed: 1.  Ultrasound-guided micropuncture access of the left common femoral artery retrograde fashion 2.  Aortogram 3.  Second order cannulation, right lower extremity angiogram 4.   Third order cannulation, right lower extremity angiogram 5.  Moderate sedation time 42 minutes, contrast volume 20 cc 6.  Mynx closure   Indications: Patient is an 83 year old male who presented to the hospital with gangrenous right fifth toe.  He had a palpable pulse in the foot, however a toe pressure of 0.  After discussing risks and benefits of diagnostic angiography in an effort to define, and possibly improve perfusion for wound healing, Carlos Hanson elected to proceed  Findings: Sluggish flow throughout the arterial system indicative of heart failure No flow-limiting stenosis appreciated in the aortoiliac segments bilaterally No flow-limiting stenosis appreciated in the right lower extremity with three-vessel runoff to the foot.  Dorsalis pedis and plantar arteries patent. 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  Pedal arch did not appear to be intact.              Procedure:  The patient was identified in the holding area and taken to room 8.  The patient was then placed supine on the table and prepped and draped in the usual sterile fashion.  A time out was called.  Ultrasound was used to evaluate the left common femoral artery.  It was patent .  A digital ultrasound image was acquired.  A micropuncture needle was used to access the left common femoral artery under ultrasound guidance.  An 018 wire was advanced without resistance and a micropuncture sheath was placed.  The 018 wire was removed and a benson wire was placed.  The micropuncture sheath was exchanged for a 5 french sheath.  An omniflush catheter was advanced over the wire to the level of L-1.  An abdominal angiogram was obtained using CO2.  Next, using the omniflush catheter and a benson wire, the aortic bifurcation was crossed and the catheter was placed into theright external iliac artery and right runoff was obtained again using CO2.  Due to slow flow, the catheter was then advanced into the superficial femoral artery to aid in tibial imaging. This  required a small contrast.   Carlos FORBES Rim MD [facility] [place] Office: [phone]     Cardiac Studies     Patient Profile     83 y.o. male with paroxysmal atrial fibrillation  Assessment & Plan    Paroxysmal atrial fibrillation Chronic heart failure with preserved  ejection fraction Morbid obesity OSA Chronic kidney disease stage IV  Still in atrial fibrillation but heart rate is fluctuating between low 90s and 100s.  I think in the setting of his infection and leaning approach is appropriate here.  He is on amiodarone  100 mg daily, with marginal blood pressure so unfortunate cannot increase his beta-blocker.  As noted yesterday given his chronic kidney disease digoxin may not be the best medication for this patient right now.  Continue the current dose of Xarelto .   From a chronic heart failure standpoint he is not volume overloaded he is currently on torsemide 40 milligrams twice daily - resume at the time of discharge.    Avoid nephrotoxins  From a cardiovascular standpoint he can be discharged to home.  I did speak with his wife yesterday.     For questions or updates, please contact [facility] Please consult [email or web address] for contact info under Cardiology/STEMI.      Signed, Celeste Candelas, DO  03/10/2024, 9:21 AM

## 2024-03-10 NOTE — Progress Notes (Signed)
 Mobility Specialist Progress Note:   03/10/24 1040  Mobility  Activity Dangled on edge of bed  Level of Assistance Dependent, patient does less than 25% (+)  Activity Response Tolerated poorly  Mobility Referral No  Mobility visit 1 Mobility  Mobility Specialist Start Time (ACUTE ONLY) 1040  Mobility Specialist Stop Time (ACUTE ONLY) 1107  Mobility Specialist Time Calculation (min) (ACUTE ONLY) 27 min   Pt received in bed, agreeable to mobility session, max encouragement. Able to sit EOB, dependent on MS (+2). Tolerated poorly. Unable to stand with Stedy. Pt experienced BM when returning back to bed. Required MaxA+3 to roll in bed. Pt cleaned, linens changed. All needs met. Call bell in reach.    Yarielis Funaro Mobility Specialist Please contact via Special Educational Needs Teacher or  Rehab office at [phone]

## 2024-03-10 NOTE — TOC Progression Note (Signed)
 Transition of Care [facility]) - Progression Note    Patient Details  Name: Carlos Hanson MRN: [id number] Date of Birth: [birth year]  Transition of Care [facility]) CM/SW Contact  [name], [name] Phone Number: 03/10/2024, 12:25 PM  Clinical Narrative:     CSW met with patient's spouse- CSW informed, waiting on insurance authorization before patient can d/c back to SNF.   Insurance still pending.  Montie Louder, MSW, LCSW Clinical Social Worker    Expected Discharge Plan: Skilled Nursing Facility Barriers to Discharge: Insurance Authorization, Continued Medical Work up               Expected Discharge Plan and Services       Living arrangements for the past 2 months: Single Family Home                                       Social Drivers of Health (SDOH) Interventions SDOH Screenings   Food Insecurity: No Food Insecurity (03/07/2024)  Housing: Low Risk  (03/07/2024)  Transportation Needs: No Transportation Needs (03/07/2024)  Utilities: Not At Risk (03/07/2024)  Social Connections: Moderately Isolated (03/07/2024)  Tobacco Use: Medium Risk (03/06/2024)    Readmission Risk Interventions     No data to display

## 2024-03-10 NOTE — Plan of Care (Signed)

## 2024-03-10 NOTE — TOC Progression Note (Signed)
 Transition of Care [facility]) - Progression Note    Patient Details  Name: Carlos Hanson MRN: [id number] Date of Birth: 1940-12-24  Transition of Care [facility]) CM/SW Contact  [name], [name] Phone Number: 03/10/2024, 2:05 PM  Clinical Narrative:     Insurance requesting a peer to peer - CSW sent secured chart to MD to contact Dr Sar @ [phone] by 12 noon tomorrow.   Montie Louder, MSW, LCSW Clinical Social Worker    Expected Discharge Plan: Skilled Nursing Facility Barriers to Discharge: Insurance Authorization, Continued Medical Work up               Expected Discharge Plan and Services       Living arrangements for the past 2 months: Single Family Home                                       Social Drivers of Health (SDOH) Interventions SDOH Screenings   Food Insecurity: No Food Insecurity (03/07/2024)  Housing: Low Risk  (03/07/2024)  Transportation Needs: No Transportation Needs (03/07/2024)  Utilities: Not At Risk (03/07/2024)  Social Connections: Moderately Isolated (03/07/2024)  Tobacco Use: Medium Risk (03/06/2024)    Readmission Risk Interventions     No data to display

## 2024-03-10 NOTE — Progress Notes (Signed)
 PROGRESS NOTE  Carlos Hanson  DOB: 07/07/1940  PCP: Carlos Toribio [name], MD FMW:[id number]  DOA: 03/06/2024  LOS: 3 days  Hospital Day: 5  Subjective: Patient was seen and examined this morning. Wife was present, they are both upset about having to wait for insurance authorization prior to return to his SNF. He has no acute complaints this morning  Brief narrative: Carlos Hanson is a 83 y.o. male with PMH significant for obesity, severe OSA, HTN, HLD, paroxysmal A-fib on Xarelto , CHF, chronic venous stasis, CAD, HLD, hiatal hernia, arthritis, chronic back pain, former smoker Patient was living in an independent living facility at [facility] side manor Most recently hospitalized 9/19-10/2 for acute worsening of CHF.  Since then, patient has been living at the SNF part of the facility.   Patient noticed discoloration of his right fifth toe several days ago.  On 11/3, staff could not feel pulse on right dorsalis pedis, and noted necrotic right great toe. Called EMS and sent him to ED.  In the ED, patient was afebrile, hemodynamically stable, breathing on room air Labs with WC count 8.9, hemoglobin 11, BUN/creatinine 51/2.41 Blood culture sent X-ray right foot showed extensive soft tissue swelling about the toes and dorsum of the foot without any findings of osteomyelitis  Vascular surgery was consulted Started on IV antibiotics Recommended to hold Xarelto  and consult Dr. Harden Admitted to [facility] ABI with abnormal bilateral toe brachial index  Assessment and plan: Gangrene of right fifth toe, POA Suspected peripheral vascular disease Presented with dry gangrene of right fifth toe  seen by vascular surgery, podiatry Imaging and ABI as above 11/5, underwent angiogram via left CFA.  No flow-limiting stenosis in the RLE with three-vessel runoff to the foot with DP and plantar arteries patent.  No stenting done. Per Dr. Harden, no need of surgical intervention at this time and can follow-up in the  office Blood culture sent from ED did not show any growth Currently on empiric antibiotic coverage with IV Unasyn and IV Zyvox Given the absence of fever and leukocytosis, patient does not need further antibiotic treatment. Discussed with Dr. Harden Recent Labs  Lab 03/06/24 1831 03/07/24 0009 03/08/24 0005 03/09/24 0639 03/10/24 0308  WBC  --  7.2 6.3 7.9 8.6  LATICACIDVEN 1.2  --   --   --   --    AKI on CKD 4 Baseline creatinine less than 2.3  Presented with creatinine elevated to 2.65, suspect prerenal in the setting of dehydration from poor oral intake Gradually improving.  Trend as below  Recent Labs    01/30/24 0238 01/31/24 0550 02/01/24 0500 02/02/24 0500 02/03/24 0640 02/17/24 1304 03/06/24 2040 03/07/24 0405 03/08/24 0754 03/09/24 0639  BUN 61* 58* 51* 48* 47* 51* 43* 42* 37* 33*  CREATININE 2.36* 2.32* 2.13* 2.02* 2.28* 2.41* 2.65* 2.48* 2.16* 2.11*  CO2 31 31 32 29 30 29 31 28 26  23   Paroxysmal A-fib Seen by EP in the past.  In the recent admission seen by heart failure services. Per note from Dr. Rolan from 10/2, patient failed DCCV X 2, last on 9/30.  Not a candidate for AVJ + PPM on his recent hospitalist, patient failed DC cardioversion.  He was started on amiodarone  load and discharged on 200 mg daily with a plan to stop amiodarone  if remains in A-fib.  Patient was also kept on Toprol -XL 25 mg daily Per patient's wife, at the rehab, patient had weakness and tremors.  Amiodarone  was cited  as the of that and hence the dose was reduced to 100 mg daily.  At the same time, Toprol  dose was increased to 75 mg daily.  While in the hospital, patient has been receiving amiodarone  100 mg daily.  Toprol  75 mg daily has been ordered but patient did not receive that yesterday or today because of heart rate in 90s and 100s.   After discussion with patient's wife today, I have stop amiodarone . I have reduced Toprol  to 25 mg daily to be started from today.  At her request, I  have consulted cardiology. Chronically anticoagulated on Xarelto .  It was switched to heparin drip on admission.  Since, no surgery is planned, switch back to Xarelto  today.  11/7--Reviewed Cardiology notes,  Patient to continue on amiodarone  100 mg daily. HR fluctuates btw 100-105 on tele He is not a candidate for Digoxin given his CKD.  Could not increase BB due to marginal BP. Continue the current dose of Xarelto . With regards tochronic heart failure standpoint he is not volume overloaded he is currently on torsemide 40 milligrams twice daily - resume at the time of discharge.    Avoid nephrotoxins   From a cardiovascular standpoint he can be discharged to home.  Chronic diastolic CHF HTN Euvolemic.  Blood pressure in normal range Last echo 01/24/2024 showed EF of 55%, normal pulmonary artery pressure PTA meds- Toprol  75 mg daily, torsemide 40 mg twice daily Has not received both the last 2 days.  Heart rate in 100s, blood pressure 90s and 100s. Toprol  25 mg resumed as stated above.  Euvolemic.  Continue to hold diuretics  CAD, HLD Continue Lipitor.   Xarelto  plan as above  Anemia of CKD GERD/hiatal hernia Hemoglobin slightly low but stable PPI to continue Recent Labs    02/17/24 1304 03/07/24 0009 03/08/24 0005 03/09/24 0639 03/10/24 0308  HGB 11.0* 11.3* 10.9* 10.9* 10.8*  MCV 95.2 96.9 94.8 95.9 96.2   Hypothyroidism Continue Synthroid  50 mcg daily  Obesity 2 Body mass index is 38.06 kg/m. Patient has been advised to make an attempt to improve diet and exercise patterns to aid in weight loss.   OSA Continue nightly CPAP  Osteoarthritis chronic back pain PTA meds- Butrans patch weekly, gabapentin  300 mg 3 times daily, Cymbalta  60 mg twice daily Continue all  Constipation Bowel regimen with scheduled Senokot and as needed MiraLAX   Dementia Aricept  5 mg nightly     Mobility: Seen by PT PT Orders: Active   PT Follow up Rec: Skilled Nursing-Short Term  Rehab (<3 Hours/Day)03/09/2024 1700   Goals of care   Code Status: Full Code     DVT prophylaxis:  SCDs Start: 03/07/24 0014 Rivaroxaban  (XARELTO ) tablet 15 mg   Antimicrobials: Can stop antibiotics Fluid: None Consultants: Vascular, orthopedics Family Communication: Wife on the phone  Status: Inpatient Level of care:  Progressive Cardiac   Patient is from: [facility] SNF Needs to continue in-hospital care: Ongoing workup Anticipated d/c to: Pending clinical course.  Back to SNF when stabilized   Diet:  Diet Order             Diet regular Room service appropriate? Yes; Fluid consistency: Thin  Diet effective now                   Scheduled Meds:  amiodarone   100 mg Oral Daily   atorvastatin   40 mg Oral Daily   [START ON 03/11/2024] buprenorphine  1 patch Transdermal Weekly   donepezil   5  mg Oral QHS   DULoxetine   60 mg Oral BID   gabapentin   300 mg Oral TID   levothyroxine   50 mcg Oral QAC breakfast   metoprolol  succinate  25 mg Oral Daily   multivitamin  1 tablet Oral BID   pantoprazole   20 mg Oral Daily   rivaroxaban   15 mg Oral Q supper   senna-docusate  1 tablet Oral QHS   sodium chloride  flush  3 mL Intravenous Q12H    PRN meds: acetaminophen , hydrALAZINE, HYDROmorphone  (DILAUDID ) injection, labetalol, melatonin, mouth rinse, oxyCODONE , polyethylene glycol, prochlorperazine, sodium chloride  flush   Infusions:   sodium chloride  100 mL/hr at 03/08/24 1131    Antimicrobials: Anti-infectives (From admission, onward)    Start     Dose/Rate Route Frequency Ordered Stop   03/07/24 1000  linezolid (ZYVOX) IVPB 600 mg  Status:  Discontinued       Placed in And Linked Group   600 mg 300 mL/hr over 60 Minutes Intravenous Every 12 hours 03/07/24 0329 03/09/24 1257   03/07/24 0600  Ampicillin-Sulbactam (UNASYN) 3 g in sodium chloride  0.9 % 100 mL IVPB  Status:  Discontinued       Placed in And Linked Group   3 g 200 mL/hr over 30 Minutes  Intravenous Every 8 hours 03/07/24 0329 03/09/24 1257   03/06/24 1715  Ampicillin-Sulbactam (UNASYN) 3 g in sodium chloride  0.9 % 100 mL IVPB       Placed in And Linked Group   3 g 200 mL/hr over 30 Minutes Intravenous  Once 03/06/24 1711 03/06/24 2224   03/06/24 1715  linezolid (ZYVOX) IVPB 600 mg       Placed in And Linked Group   600 mg 300 mL/hr over 60 Minutes Intravenous  Once 03/06/24 1711 03/06/24 2224       Objective: Vitals:   03/10/24 1138 03/10/24 1215  BP: 118/86 109/84  Pulse: 96 85  Resp:  18  Temp:  98.5 F (36.9 C)  SpO2:  94%    Intake/Output Summary (Last 24 hours) at 03/10/2024 1308 Last data filed at 03/10/2024 0437 Gross per 24 hour  Intake 418.84 ml  Output 850 ml  Net -431.16 ml   Filed Weights   03/08/24 0342 03/08/24 0618 03/09/24 0509  Weight: 130.5 kg 130.3 kg 127.3 kg   Weight change:  Body mass index is 38.06 kg/m.   Physical Exam: General exam: Pleasant, elderly Caucasian male.  Not in pain Skin: No rashes, lesions or ulcers. HEENT: Atraumatic, normocephalic, no obvious bleeding Lungs: Clear to auscultation bilaterally,  CVS: A-fib with mild tachycardia, S1, S2, no murmur,   GI/Abd: Soft, nontender, nondistended, bowel sound present,   CNS: Alert, awake, slow to answer but oriented x 3 Psychiatry: Mood appropriate Extremities: No pedal edema, no calf tenderness, right fifth toe gangrenous  Data Review: I have personally reviewed the laboratory data and studies available.  F/u labs ordered Unresulted Labs (From admission, onward)     Start     Ordered   03/08/24 0500  CBC  Daily,   R      03/07/24 1444            Signed, Landon FORBES Baller, MD [facility] 03/10/2024

## 2024-03-10 NOTE — Progress Notes (Incomplete)
 Mobility Specialist Progress Note:  Pre Mobility: During Mobility: Post Mobility:  Feliciana Rossetti Mobility Specialist Please contact via SecureChat or  Rehab office at [phone]

## 2024-03-10 NOTE — Telephone Encounter (Signed)
 Called to confirm/remind patient of their appointment at [facility] on 03/13/24.   Appointment:   [] Confirmed  [x] Left mess   [] No answer/No voice mail  [] VM Full/unable to leave message  [] Phone not in service  Someone will call patient 10-15 minutes before appointment to review pt's medication.

## 2024-03-11 DIAGNOSIS — I96 Gangrene, not elsewhere classified: Secondary | ICD-10-CM | POA: Diagnosis not present

## 2024-03-11 LAB — CBC
HCT: 33.8 % — ABNORMAL LOW (ref 39.0–52.0)
HCT: 34.3 % — ABNORMAL LOW (ref 39.0–52.0)
Hemoglobin: 10.6 g/dL — ABNORMAL LOW (ref 13.0–17.0)
Hemoglobin: 10.8 g/dL — ABNORMAL LOW (ref 13.0–17.0)
MCH: 29.8 pg (ref 26.0–34.0)
MCH: 29.8 pg (ref 26.0–34.0)
MCHC: 31.4 g/dL (ref 30.0–36.0)
MCHC: 31.5 g/dL (ref 30.0–36.0)
MCV: 94.9 fL (ref 80.0–100.0)
MCV: 94.8 fL (ref 80.0–100.0)
Platelets: 292 K/uL (ref 150–400)
Platelets: 314 K/uL (ref 150–400)
RBC: 3.56 MIL/uL — ABNORMAL LOW (ref 4.22–5.81)
RBC: 3.62 MIL/uL — ABNORMAL LOW (ref 4.22–5.81)
RDW: 16 % — ABNORMAL HIGH (ref 11.5–15.5)
RDW: 16.1 % — ABNORMAL HIGH (ref 11.5–15.5)
WBC: 7.2 K/uL (ref 4.0–10.5)
WBC: 8.4 K/uL (ref 4.0–10.5)
nRBC: 0 % (ref 0.0–0.2)
nRBC: 0 % (ref 0.0–0.2)

## 2024-03-11 LAB — COMPREHENSIVE METABOLIC PANEL WITH GFR
ALT: 17 U/L (ref 0–44)
AST: 19 U/L (ref 15–41)
Albumin: 2.2 g/dL — ABNORMAL LOW (ref 3.5–5.0)
Alkaline Phosphatase: 56 U/L (ref 38–126)
Anion gap: 11 (ref 5–15)
BUN: 28 mg/dL — ABNORMAL HIGH (ref 8–23)
CO2: 27 mmol/L (ref 22–32)
Calcium: 8.8 mg/dL — ABNORMAL LOW (ref 8.9–10.3)
Chloride: 102 mmol/L (ref 98–111)
Creatinine, Ser: 1.81 mg/dL — ABNORMAL HIGH (ref 0.61–1.24)
GFR, Estimated: 36 mL/min — ABNORMAL LOW (ref >60)
Glucose, Bld: 86 mg/dL (ref 70–99)
Potassium: 4 mmol/L (ref 3.5–5.1)
Sodium: 140 mmol/L (ref 135–145)
Total Bilirubin: 1 mg/dL (ref 0.0–1.2)
Total Protein: 6.1 g/dL — ABNORMAL LOW (ref 6.5–8.1)

## 2024-03-11 LAB — CULTURE, BLOOD (ROUTINE X 2)
Culture: NO GROWTH
Culture: NO GROWTH
Special Requests: ADEQUATE

## 2024-03-11 NOTE — Progress Notes (Signed)
 Patient refused CPAP, but cpap will be on standby

## 2024-03-11 NOTE — TOC Progression Note (Signed)
 Transition of Care [facility]) - Progression Note    Patient Details  Name: Carlos Hanson MRN: [id number] Date of Birth: 1940/10/22  Transition of Care [facility]) CM/SW Contact  Wilbert Hayashi [place], [name] Phone Number: 03/11/2024, 4:11 PM  Clinical Narrative:    Denial letter for SNF received and provided to patient at bedside. This child psychotherapist informed patient's spouse of denial by phone. Spouse confirms plan for patient to return to [facility] for long term care. Per spouse, she is prepared to pay privately for long term care. She has been in contact with Kristin, admissions director who is expecting patient to admit on Monday.  Jeyla Bulger, LCSW Transition of Care     Expected Discharge Plan: Skilled Nursing Facility Barriers to Discharge: English As A Second Language Teacher, Continued Medical Work up               Expected Discharge Plan and Services       Living arrangements for the past 2 months: Single Family Home                                       Social Drivers of Health (SDOH) Interventions SDOH Screenings   Food Insecurity: No Food Insecurity (03/07/2024)  Housing: Low Risk  (03/07/2024)  Transportation Needs: No Transportation Needs (03/07/2024)  Utilities: Not At Risk (03/07/2024)  Social Connections: Moderately Isolated (03/07/2024)  Tobacco Use: Medium Risk (03/06/2024)    Readmission Risk Interventions     No data to display

## 2024-03-11 NOTE — Progress Notes (Signed)
 PROGRESS NOTE  Carlos Hanson  DOB: 18-Nov-1940  PCP: Yolande Toribio [name], MD FMW:[id number]  DOA: 03/06/2024  LOS: 4 days  Hospital Day: 6  Subjective: Patient was seen and examined this morning. He has no acute complaints.  Awaits insurance authorization prior to discharge to a skilled nursing facility.  Peer to peer was performed this morning.  Brief narrative: Carlos Hanson is a 83 y.o. male with PMH significant for obesity, severe OSA, HTN, HLD, paroxysmal A-fib on Xarelto , CHF, chronic venous stasis, CAD, HLD, hiatal hernia, arthritis, chronic back pain, former smoker Patient was living in an independent living facility at [facility] side manor Most recently hospitalized 9/19-10/2 for acute worsening of CHF.  Since then, patient has been living at the SNF part of the facility.   Patient noticed discoloration of his right fifth toe several days ago.  On 11/3, staff could not feel pulse on right dorsalis pedis, and noted necrotic right great toe. Called EMS and sent him to ED.  In the ED, patient was afebrile, hemodynamically stable, breathing on room air Labs with WC count 8.9, hemoglobin 11, BUN/creatinine 51/2.41 Blood culture sent X-ray right foot showed extensive soft tissue swelling about the toes and dorsum of the foot without any findings of osteomyelitis  Vascular surgery was consulted Started on IV antibiotics Recommended to hold Xarelto  and consult Dr. Harden Admitted to [facility] ABI with abnormal bilateral toe brachial index  Assessment and plan: Gangrene of right fifth toe, POA Suspected peripheral vascular disease Presented with dry gangrene of right fifth toe  seen by vascular surgery, podiatry Imaging and ABI as above 11/5, underwent angiogram via left CFA.  No flow-limiting stenosis in the RLE with three-vessel runoff to the foot with DP and plantar arteries patent.  No stenting done. Per Dr. Harden, no need of surgical intervention at this time and can follow-up in the  office Blood culture sent from ED did not show any growth Currently on empiric antibiotic coverage with IV Unasyn and IV Zyvox Given the absence of fever and leukocytosis, patient does not need further antibiotic treatment. Discussed with Dr. Harden Recent Labs  Lab 03/06/24 1831 03/07/24 0009 03/08/24 0005 03/09/24 0639 03/10/24 0308 03/11/24 0342  WBC  --  7.2 6.3 7.9 8.6 7.2  LATICACIDVEN 1.2  --   --   --   --   --    AKI on CKD 4 Baseline creatinine less than 2.3  Presented with creatinine elevated to 2.65, suspect prerenal in the setting of dehydration from poor oral intake Gradually improving.  Trend as below  Recent Labs    01/30/24 0238 01/31/24 0550 02/01/24 0500 02/02/24 0500 02/03/24 0640 02/17/24 1304 03/06/24 2040 03/07/24 0405 03/08/24 0754 03/09/24 0639  BUN 61* 58* 51* 48* 47* 51* 43* 42* 37* 33*  CREATININE 2.36* 2.32* 2.13* 2.02* 2.28* 2.41* 2.65* 2.48* 2.16* 2.11*  CO2 31 31 32 29 30 29 31 28 26  23   Paroxysmal A-fib Seen by EP in the past.  In the recent admission seen by heart failure services. Per note from Dr. Rolan from 10/2, patient failed DCCV X 2, last on 9/30.  Not a candidate for AVJ + PPM on his recent hospitalist, patient failed DC cardioversion.  He was started on amiodarone  load and discharged on 200 mg daily with a plan to stop amiodarone  if remains in A-fib.  Patient was also kept on Toprol -XL 25 mg daily Per patient's wife, at the rehab, patient had weakness and tremors.  Amiodarone  was cited as the of that and hence the dose was reduced to 100 mg daily.  At the same time, Toprol  dose was increased to 75 mg daily.  While in the hospital, patient has been receiving amiodarone  100 mg daily.  Toprol  75 mg daily has been ordered but patient did not receive that yesterday or today because of heart rate in 90s and 100s.   After discussion with patient's wife today, I have stop amiodarone . I have reduced Toprol  to 25 mg daily to be started from  today.  At her request, I have consulted cardiology. Chronically anticoagulated on Xarelto .  It was switched to heparin drip on admission.  Since, no surgery is planned, switch back to Xarelto  today.  11/8--Reviewed Cardiology notes,  Patient to continue on amiodarone  100 mg daily. HR fluctuates btw 100-105 on tele He is not a candidate for Digoxin given his CKD.  Could not increase BB due to marginal BP. Continue the current dose of Xarelto . With regards to chronic heart failure standpoint he is not volume overloaded he is currently on torsemide 40 milligrams twice daily - resume at the time of discharge.    Avoid nephrotoxins   From a cardiovascular standpoint he can be discharged to home.  Chronic diastolic CHF HTN Euvolemic.  Blood pressure in normal range Last echo 01/24/2024 showed EF of 55%, normal pulmonary artery pressure PTA meds- Toprol  75 mg daily, torsemide 40 mg twice daily Has not received both the last 2 days.  Heart rate in 100s, blood pressure 90s and 100s. Toprol  25 mg resumed as stated above.  Euvolemic.  Continue to hold diuretics  CAD, HLD Continue Lipitor.   Xarelto  plan as above  Anemia of CKD GERD/hiatal hernia Hemoglobin slightly low but stable PPI to continue Recent Labs    03/07/24 0009 03/08/24 0005 03/09/24 0639 03/10/24 0308 03/11/24 0342  HGB 11.3* 10.9* 10.9* 10.8* 10.6*  MCV 96.9 94.8 95.9 96.2 94.9   Hypothyroidism Continue Synthroid  50 mcg daily  Obesity 2 Body mass index is 38.06 kg/m. Patient has been advised to make an attempt to improve diet and exercise patterns to aid in weight loss.   OSA Continue nightly CPAP  Osteoarthritis chronic back pain PTA meds- Butrans patch weekly, gabapentin  300 mg 3 times daily, Cymbalta  60 mg twice daily Continue all  Constipation Bowel regimen with scheduled Senokot and as needed MiraLAX   Dementia Aricept  5 mg nightly     Mobility: Seen by PT PT Orders: Active   PT Follow up Rec:  Skilled Nursing-Short Term Rehab (<3 Hours/Day)03/09/2024 1700   Goals of care   Code Status: Full Code     DVT prophylaxis:  SCDs Start: 03/07/24 0014 Rivaroxaban  (XARELTO ) tablet 15 mg   Antimicrobials: Can stop antibiotics Fluid: None Consultants: Vascular, orthopedics Family Communication: Wife on the phone  Status: Inpatient Level of care:  Progressive Cardiac   Patient is from: [facility] SNF Needs to continue in-hospital care: Ongoing workup Anticipated d/c to: Pending clinical course.  Back to SNF when stabilized   Diet:  Diet Order             Diet regular Room service appropriate? Yes; Fluid consistency: Thin  Diet effective now                   Scheduled Meds:  amiodarone   100 mg Oral Daily   atorvastatin   40 mg Oral Daily   buprenorphine  1 patch Transdermal Weekly   donepezil   5 mg Oral QHS   DULoxetine   60 mg Oral BID   gabapentin   300 mg Oral TID   levothyroxine   50 mcg Oral QAC breakfast   metoprolol  succinate  25 mg Oral Daily   multivitamin  1 tablet Oral BID   pantoprazole   20 mg Oral Daily   rivaroxaban   15 mg Oral Q supper   senna-docusate  1 tablet Oral QHS   sodium chloride  flush  3 mL Intravenous Q12H    PRN meds: acetaminophen , hydrALAZINE, HYDROmorphone  (DILAUDID ) injection, labetalol, melatonin, mouth rinse, oxyCODONE , polyethylene glycol, prochlorperazine, sodium chloride  flush   Infusions:   sodium chloride  100 mL/hr at 03/08/24 1131    Antimicrobials: Anti-infectives (From admission, onward)    Start     Dose/Rate Route Frequency Ordered Stop   03/07/24 1000  linezolid (ZYVOX) IVPB 600 mg  Status:  Discontinued       Placed in And Linked Group   600 mg 300 mL/hr over 60 Minutes Intravenous Every 12 hours 03/07/24 0329 03/09/24 1257   03/07/24 0600  Ampicillin-Sulbactam (UNASYN) 3 g in sodium chloride  0.9 % 100 mL IVPB  Status:  Discontinued       Placed in And Linked Group   3 g 200 mL/hr over 30 Minutes  Intravenous Every 8 hours 03/07/24 0329 03/09/24 1257   03/06/24 1715  Ampicillin-Sulbactam (UNASYN) 3 g in sodium chloride  0.9 % 100 mL IVPB       Placed in And Linked Group   3 g 200 mL/hr over 30 Minutes Intravenous  Once 03/06/24 1711 03/06/24 2224   03/06/24 1715  linezolid (ZYVOX) IVPB 600 mg       Placed in And Linked Group   600 mg 300 mL/hr over 60 Minutes Intravenous  Once 03/06/24 1711 03/06/24 2224       Objective: Vitals:   03/11/24 0401 03/11/24 0732  BP: (!) 127/91 101/78  Pulse: 99 98  Resp: 16 17  Temp: 97.9 F (36.6 C) 97.8 F (36.6 C)  SpO2: 95% 96%    Intake/Output Summary (Last 24 hours) at 03/11/2024 1034 Last data filed at 03/10/2024 2334 Gross per 24 hour  Intake --  Output 550 ml  Net -550 ml   Filed Weights   03/08/24 0342 03/08/24 0618 03/09/24 0509  Weight: 130.5 kg 130.3 kg 127.3 kg   Weight change:  Body mass index is 38.06 kg/m.   Physical Exam: General exam: Pleasant, elderly Caucasian male.  Not in pain Skin: No rashes, lesions or ulcers. HEENT: Atraumatic, normocephalic, no obvious bleeding Lungs: Clear to auscultation bilaterally,  CVS: A-fib with mild tachycardia, S1, S2, no murmur,   GI/Abd: Soft, nontender, nondistended, bowel sound present,   CNS: Alert, awake, slow to answer but oriented x 3 Psychiatry: Mood appropriate Extremities: No pedal edema, no calf tenderness, right fifth toe gangrenous  Data Review: I have personally reviewed the laboratory data and studies available.  F/u labs ordered Unresulted Labs (From admission, onward)     Start     Ordered   03/08/24 0500  CBC  Daily,   R      03/07/24 1444            Signed, Landon FORBES Baller, MD [facility] 03/11/2024

## 2024-03-11 NOTE — Plan of Care (Signed)
   Problem: Education: Goal: Knowledge of General Education information will improve Description Including pain rating scale, medication(s)/side effects and non-pharmacologic comfort measures Outcome: Progressing

## 2024-03-11 NOTE — TOC Progression Note (Signed)
 Transition of Care [facility]) - Progression Note    Patient Details  Name: Carlos Hanson MRN: [id number] Date of Birth: 1940-12-25  Transition of Care [facility]) CM/SW Contact  [address] [place], [name] Phone Number: 03/11/2024, 2:01 PM  Clinical Narrative:    Peer to Peer completed with insurance company. Per Bernice with [employer], SNF authorization was denied. She will fax denial letter today that will need to  be provided to patient/family.  Tene Gato, LCSW Transition of Care     Expected Discharge Plan: Skilled Nursing Facility Barriers to Discharge: English As A Second Language Teacher, Continued Medical Work up               Expected Discharge Plan and Services       Living arrangements for the past 2 months: Single Family Home                                       Social Drivers of Health (SDOH) Interventions SDOH Screenings   Food Insecurity: No Food Insecurity (03/07/2024)  Housing: Low Risk  (03/07/2024)  Transportation Needs: No Transportation Needs (03/07/2024)  Utilities: Not At Risk (03/07/2024)  Social Connections: Moderately Isolated (03/07/2024)  Tobacco Use: Medium Risk (03/06/2024)    Readmission Risk Interventions     No data to display

## 2024-03-12 DIAGNOSIS — I96 Gangrene, not elsewhere classified: Secondary | ICD-10-CM | POA: Diagnosis not present

## 2024-03-12 LAB — CBC
HCT: 34.6 % — ABNORMAL LOW (ref 39.0–52.0)
Hemoglobin: 10.8 g/dL — ABNORMAL LOW (ref 13.0–17.0)
MCH: 29.7 pg (ref 26.0–34.0)
MCHC: 31.2 g/dL (ref 30.0–36.0)
MCV: 95.1 fL (ref 80.0–100.0)
Platelets: 305 K/uL (ref 150–400)
RBC: 3.64 MIL/uL — ABNORMAL LOW (ref 4.22–5.81)
RDW: 15.9 % — ABNORMAL HIGH (ref 11.5–15.5)
WBC: 8.1 K/uL (ref 4.0–10.5)
nRBC: 0 % (ref 0.0–0.2)

## 2024-03-12 NOTE — Plan of Care (Signed)
   Problem: Nutrition: Goal: Adequate nutrition will be maintained Outcome: Progressing

## 2024-03-12 NOTE — Plan of Care (Signed)

## 2024-03-12 NOTE — Progress Notes (Signed)
 PROGRESS NOTE  Carlos Hanson  DOB: [birth year]  PCP: Yolande Toribio [name], MD FMW:[id number]  DOA: 03/06/2024  LOS: 5 days  Hospital Day: 7  Subjective: Patient was seen and examined this morning. He has no acute complaints.  Awaits insurance authorization prior to discharge to a skilled nursing facility.  Peer to peer was performed.  Brief narrative: Carlos Hanson is a 83 y.o. male with PMH significant for obesity, severe OSA, HTN, HLD, paroxysmal A-fib on Xarelto , CHF, chronic venous stasis, CAD, HLD, hiatal hernia, arthritis, chronic back pain, former smoker Patient was living in an independent living facility at [facility] side manor Most recently hospitalized 9/19-10/2 for acute worsening of CHF.  Since then, patient has been living at the SNF part of the facility.   Patient noticed discoloration of his right fifth toe several days ago.  On 11/3, staff could not feel pulse on right dorsalis pedis, and noted necrotic right great toe. Called EMS and sent him to ED.  In the ED, patient was afebrile, hemodynamically stable, breathing on room air Labs with WC count 8.9, hemoglobin 11, BUN/creatinine 51/2.41 Blood culture sent X-ray right foot showed extensive soft tissue swelling about the toes and dorsum of the foot without any findings of osteomyelitis  Vascular surgery was consulted Started on IV antibiotics Admitted to [facility] ABI with abnormal bilateral toe brachial index  Assessment and plan: Gangrene of right fifth toe, POA Suspected peripheral vascular disease Presented with dry gangrene of right fifth toe  seen by vascular surgery, podiatry Imaging and ABI as above 11/5, underwent angiogram via left CFA.  No flow-limiting stenosis in the RLE with three-vessel runoff to the foot with DP and plantar arteries patent.  No stenting done. Per Dr. Harden, no need of surgical intervention at this time and can follow-up in the office Blood culture sent from ED did not show any  growth Currently off empiric antibiotic coverage with IV Unasyn and IV Zyvox Given the absence of fever and leukocytosis, patient does not need further antibiotic treatment. Discussed with Dr. Harden Recent Labs  Lab 03/06/24 1831 03/07/24 0009 03/09/24 9360 03/10/24 0308 03/11/24 0342 03/11/24 1132 03/12/24 0255  WBC  --    < > 7.9 8.6 7.2 8.4 8.1  LATICACIDVEN 1.2  --   --   --   --   --   --    < > = values in this interval not displayed.   AKI on CKD 4 Baseline creatinine less than 2.3  Presented with creatinine elevated to 2.65, suspect prerenal in the setting of dehydration from poor oral intake Gradually improving.  Trend as below  Recent Labs    01/31/24 0550 02/01/24 0500 02/02/24 0500 02/03/24 0640 02/17/24 1304 03/06/24 2040 03/07/24 0405 03/08/24 0754 03/09/24 0639 03/11/24 1132  BUN 58* 51* 48* 47* 51* 43* 42* 37* 33* 28*  CREATININE 2.32* 2.13* 2.02* 2.28* 2.41* 2.65* 2.48* 2.16* 2.11* 1.81*  CO2 31 32 29 30 29 31 28 26 23  27   Paroxysmal A-fib Seen by EP in the past.  In the recent admission seen by heart failure services. Per note from Dr. Rolan from 10/2, patient failed DCCV X 2, last on 9/30.  Not a candidate for AVJ + PPM on his recent hospitalist, patient failed DC cardioversion.  He was started on amiodarone  load and discharged on 200 mg daily with a plan to stop amiodarone  if remains in A-fib.  Patient was also kept on Toprol -XL 25 mg daily Per  patient's wife, at the rehab, patient had weakness and tremors.  Amiodarone  was cited as the of that and hence the dose was reduced to 100 mg daily.  At the same time, Toprol  dose was increased to 75 mg daily.  While in the hospital, patient has been receiving amiodarone  100 mg daily.  Toprol  75 mg daily has been ordered but patient did not receive that yesterday or today because of heart rate in 90s and 100s.   After discussion with patient's wife today, I have stop amiodarone . I have reduced Toprol  to 25 mg  daily to be started from today.  At her request, I have consulted cardiology. Chronically anticoagulated on Xarelto .  It was switched to heparin drip on admission.  Since, no surgery is planned, switch back to Xarelto  today.  11/8--Reviewed Cardiology notes,  Patient to continue on amiodarone  100 mg daily. HR fluctuates btw 100-105 on tele He is not a candidate for Digoxin given his CKD.  Could not increase BB due to marginal BP. Continue the current dose of Xarelto . With regards to chronic heart failure standpoint he is not volume overloaded he is currently on torsemide 40 milligrams twice daily - resume at the time of discharge.    Avoid nephrotoxins   From a cardiovascular standpoint he can be discharged to home.  Chronic diastolic CHF HTN Euvolemic.  Blood pressure in normal range Last echo 01/24/2024 showed EF of 55%, normal pulmonary artery pressure PTA meds- Toprol  75 mg daily, torsemide 40 mg twice daily  Heart rate in 100s, blood pressure 90s and 100s. Toprol  25 mg resumed as stated above.  Euvolemic.  Continue to hold diuretics  CAD, HLD Continue Lipitor.   Xarelto  plan as above  Anemia of CKD GERD/hiatal hernia Hemoglobin slightly low but stable PPI to continue Recent Labs    03/09/24 0639 03/10/24 0308 03/11/24 0342 03/11/24 1132 03/12/24 0255  HGB 10.9* 10.8* 10.6* 10.8* 10.8*  MCV 95.9 96.2 94.9 94.8 95.1   Hypothyroidism Continue Synthroid  50 mcg daily  Obesity 2 Body mass index is 34.89 kg/m. Patient has been advised to make an attempt to improve diet and exercise patterns to aid in weight loss.   OSA Continue nightly CPAP  Osteoarthritis chronic back pain PTA meds- Butrans patch weekly, gabapentin  300 mg 3 times daily, Cymbalta  60 mg twice daily Continue all  Constipation Bowel regimen with scheduled Senokot and as needed MiraLAX   Dementia Aricept  5 mg nightly     Mobility: Seen by PT PT Orders: Active   PT Follow up Rec: Skilled  Nursing-Short Term Rehab (<3 Hours/Day)03/09/2024 1700   Goals of care   Code Status: Full Code     DVT prophylaxis:  SCDs Start: 03/07/24 0014 Rivaroxaban  (XARELTO ) tablet 15 mg   Antimicrobials: Can stop antibiotics Fluid: None Consultants: Vascular, orthopedics Family Communication: Wife on the phone  Status: Inpatient Level of care:  Progressive Cardiac   Patient is from: [facility] SNF Needs to continue in-hospital care: Ongoing workup Anticipated d/c to: Pending clinical course.  Back to SNF when stabilized   Diet:  Diet Order             Diet regular Room service appropriate? Yes; Fluid consistency: Thin  Diet effective now                   Scheduled Meds:  amiodarone   100 mg Oral Daily   atorvastatin   40 mg Oral Daily   buprenorphine  1 patch Transdermal Weekly  donepezil   5 mg Oral QHS   DULoxetine   60 mg Oral BID   gabapentin   300 mg Oral TID   levothyroxine   50 mcg Oral QAC breakfast   metoprolol  succinate  25 mg Oral Daily   multivitamin  1 tablet Oral BID   pantoprazole   20 mg Oral Daily   rivaroxaban   15 mg Oral Q supper   senna-docusate  1 tablet Oral QHS   sodium chloride  flush  3 mL Intravenous Q12H    PRN meds: acetaminophen , hydrALAZINE, HYDROmorphone  (DILAUDID ) injection, labetalol, melatonin, mouth rinse, oxyCODONE , polyethylene glycol, prochlorperazine, sodium chloride  flush   Infusions:   sodium chloride  100 mL/hr at 03/12/24 0000    Antimicrobials: Anti-infectives (From admission, onward)    Start     Dose/Rate Route Frequency Ordered Stop   03/07/24 1000  linezolid (ZYVOX) IVPB 600 mg  Status:  Discontinued       Placed in And Linked Group   600 mg 300 mL/hr over 60 Minutes Intravenous Every 12 hours 03/07/24 0329 03/09/24 1257   03/07/24 0600  Ampicillin-Sulbactam (UNASYN) 3 g in sodium chloride  0.9 % 100 mL IVPB  Status:  Discontinued       Placed in And Linked Group   3 g 200 mL/hr over 30 Minutes  Intravenous Every 8 hours 03/07/24 0329 03/09/24 1257   03/06/24 1715  Ampicillin-Sulbactam (UNASYN) 3 g in sodium chloride  0.9 % 100 mL IVPB       Placed in And Linked Group   3 g 200 mL/hr over 30 Minutes Intravenous  Once 03/06/24 1711 03/06/24 2224   03/06/24 1715  linezolid (ZYVOX) IVPB 600 mg       Placed in And Linked Group   600 mg 300 mL/hr over 60 Minutes Intravenous  Once 03/06/24 1711 03/06/24 2224       Objective: Vitals:   03/12/24 0738 03/12/24 1125  BP: 117/74 100/85  Pulse: (!) 108 (!) 106  Resp: 18 17  Temp: 98.7 F (37.1 C) 97.6 F (36.4 C)  SpO2: 95% 96%    Intake/Output Summary (Last 24 hours) at 03/12/2024 1130 Last data filed at 03/12/2024 0530 Gross per 24 hour  Intake 897.3 ml  Output 950 ml  Net -52.7 ml   Filed Weights   03/08/24 0618 03/09/24 0509 03/12/24 0223  Weight: 130.3 kg 127.3 kg 116.7 kg   Weight change:  Body mass index is 34.89 kg/m.   Physical Exam: General exam: Pleasant, elderly Caucasian male.  Not in pain Skin: No rashes, lesions or ulcers. HEENT: Atraumatic, normocephalic, no obvious bleeding Lungs: Clear to auscultation bilaterally,  CVS: A-fib with mild tachycardia, S1, S2, no murmur,   GI/Abd: Soft, nontender, nondistended, bowel sound present,   CNS: Alert, awake, slow to answer but oriented x 3 Psychiatry: Mood appropriate Extremities: No pedal edema, no calf tenderness, right fifth toe gangrenous  Data Review: I have personally reviewed the laboratory data and studies available.  F/u labs ordered Unresulted Labs (From admission, onward)     Start     Ordered   03/08/24 0500  CBC  Daily,   R      03/07/24 1444            Signed, Landon FORBES Baller, MD [facility] 03/12/2024

## 2024-03-13 ENCOUNTER — Inpatient Hospital Stay (HOSPITAL_COMMUNITY): Admission: RE | Admit: 2024-03-13 | Source: Ambulatory Visit

## 2024-03-13 DIAGNOSIS — I96 Gangrene, not elsewhere classified: Secondary | ICD-10-CM | POA: Diagnosis not present

## 2024-03-13 LAB — CBC
HCT: 34.1 % — ABNORMAL LOW (ref 39.0–52.0)
Hemoglobin: 10.7 g/dL — ABNORMAL LOW (ref 13.0–17.0)
MCH: 29.6 pg (ref 26.0–34.0)
MCHC: 31.4 g/dL (ref 30.0–36.0)
MCV: 94.2 fL (ref 80.0–100.0)
Platelets: 318 K/uL (ref 150–400)
RBC: 3.62 MIL/uL — ABNORMAL LOW (ref 4.22–5.81)
RDW: 15.7 % — ABNORMAL HIGH (ref 11.5–15.5)
WBC: 9.3 K/uL (ref 4.0–10.5)
nRBC: 0 % (ref 0.0–0.2)

## 2024-03-13 MED ORDER — ATORVASTATIN CALCIUM 20 MG PO TABS: 40.0000 mg | ORAL_TABLET | Freq: Every day | ORAL | 0 refills | Status: DC

## 2024-03-13 MED ORDER — AMIODARONE HCL 200 MG PO TABS: 100.0000 mg | ORAL_TABLET | Freq: Every day | ORAL | 0 refills | Status: DC

## 2024-03-13 MED ORDER — METOPROLOL SUCCINATE ER 25 MG PO TB24: 25.0000 mg | ORAL_TABLET | Freq: Every day | ORAL | 0 refills | Status: DC

## 2024-03-13 MED ORDER — BUPRENORPHINE 5 MCG/HR TD PTWK: 1.0000 | MEDICATED_PATCH | TRANSDERMAL | 0 refills | Status: DC

## 2024-03-13 NOTE — Plan of Care (Signed)
 Transferring back to SNF

## 2024-03-13 NOTE — Discharge Summary (Signed)
 Physician Discharge Summary   Patient: Carlos Hanson MRN: [id number] DOB: 1940-07-01  Admit date:     03/06/2024  Discharge date: 03/13/24  Discharge Physician: Carlos Hanson   PCP: Carlos Toribio [name], MD   Recommendations at discharge:    Follow up with PCP and Cardiology  Discharge Diagnoses: Principal Problem:   Toe necrosis (HCC)  Resolved Problems:   * No resolved hospital problems. *  Hospital Course: Carlos Hanson is a 83 y.o. male with PMH significant for obesity, severe OSA, HTN, HLD, paroxysmal A-fib on Xarelto , CHF, chronic venous stasis, CAD, HLD, hiatal hernia, arthritis, chronic back pain, former smoker Patient was living in an independent living facility at [facility] Most recently hospitalized 9/19-10/2 for acute worsening of CHF.  Since then, patient has been living at the SNF part of the facility.   Patient noticed discoloration of his right fifth toe several days ago.  On 11/3, staff could not feel pulse on right dorsalis pedis, and noted necrotic right great toe. Called EMS and sent him to ED.   In the ED, patient was afebrile, hemodynamically stable, breathing on room air Labs with WC count 8.9, hemoglobin 11, BUN/creatinine 51/2.41 Blood culture sent X-ray right foot showed extensive soft tissue swelling about the toes and dorsum of the foot without any findings of osteomyelitis   Vascular surgery was consulted Started on IV antibiotics Admitted to [facility] with abnormal bilateral toe brachial index  Assessment and Plan: Gangrene of right fifth toe, POA Suspected peripheral vascular disease Presented with dry gangrene of right fifth toe  seen by vascular surgery, podiatry Imaging and ABI as above 11/5, underwent angiogram via left CFA.  No flow-limiting stenosis in the RLE with three-vessel runoff to the foot with DP and plantar arteries patent.  No stenting done. Per Carlos Hanson, no need of surgical intervention at this time and can follow-up in  the office Blood culture sent from ED did not show any growth Currently off empiric antibiotic coverage with IV Unasyn and IV Zyvox Given the absence of fever and leukocytosis, patient does not need further antibiotic treatment. Discussed with Carlos Hanson Last Labs           Recent Labs  Lab 03/06/24 1831 03/07/24 0009 03/09/24 9360 03/10/24 0308 03/11/24 0342 03/11/24 1132 03/12/24 0255  WBC  --    < > 7.9 8.6 7.2 8.4 8.1  LATICACIDVEN 1.2  --   --   --   --   --   --    < > = values in this interval not displayed.      AKI on CKD 4 Baseline creatinine less than 2.3  Presented with creatinine elevated to 2.65, suspect prerenal in the setting of dehydration from poor oral intake Gradually improving.  Trend as below    Recent Labs (within last 365 days)              Recent Labs    01/31/24 0550 02/01/24 0500 02/02/24 0500 02/03/24 0640 02/17/24 1304 03/06/24 2040 03/07/24 0405 03/08/24 0754 03/09/24 0639 03/11/24 1132  BUN 58* 51* 48* 47* 51* 43* 42* 37* 33* 28*  CREATININE 2.32* 2.13* 2.02* 2.28* 2.41* 2.65* 2.48* 2.16* 2.11* 1.81*  CO2 31 32 29 30 29 31 28 26 23  27      Paroxysmal A-fib Seen by EP in the past.  In the recent admission seen by heart failure services. Per note from Dr. Rolan from 10/2, patient failed DCCV  X 2, last on 9/30.  Not a candidate for AVJ + PPM on his recent hospitalist, patient failed DC cardioversion.  He was started on amiodarone  load and discharged on 200 mg daily with a plan to stop amiodarone  if remains in A-fib.  Patient was also kept on Toprol -XL 25 mg daily Per patient's wife, at the rehab, patient had weakness and tremors.  Amiodarone  was cited as the of that and hence the dose was reduced to 100 mg daily.    11/8--Reviewed Cardiology notes,  Patient to continue on amiodarone  100 mg daily. HR fluctuates btw 100-105 on tele He is not a candidate for Digoxin given his CKD.  Could not increase BB due to marginal BP. Continue the  current dose of Xarelto . With regards to chronic heart failure standpoint he is not volume overloaded he is currently on torsemide 40 milligrams twice daily - resume at the time of discharge.    Avoid nephrotoxins   From a cardiovascular standpoint he can be discharged to home.   Chronic diastolic CHF HTN Euvolemic.  Blood pressure in normal range Last echo 01/24/2024 showed EF of 55%, normal pulmonary artery pressure PTA meds- Toprol  75 mg daily, torsemide 40 mg twice daily  Heart rate in 100s, blood pressure 90s and 100s. Toprol  25 mg resumed as stated above.  Euvolemic.  Continue to hold diuretics   CAD, HLD Continue Lipitor.   Xarelto  plan as above   Anemia of CKD GERD/hiatal hernia Hemoglobin slightly low but stable PPI to continue Recent Labs (within last 365 days)         Recent Labs    03/09/24 0639 03/10/24 0308 03/11/24 0342 03/11/24 1132 03/12/24 0255  HGB 10.9* 10.8* 10.6* 10.8* 10.8*  MCV 95.9 96.2 94.9 94.8 95.1      Hypothyroidism Continue Synthroid  50 mcg daily   Obesity 2 Body mass index is 34.89 kg/m. Patient has been advised to make an attempt to improve diet and exercise patterns to aid in weight loss.   OSA Continue nightly CPAP   Osteoarthritis chronic back pain PTA meds- Butrans patch weekly, gabapentin  300 mg 3 times daily, Cymbalta  60 mg twice daily Continue all   Constipation Bowel regimen with scheduled Senokot and as needed MiraLAX    Dementia Aricept  5 mg nightly        Consultants: Cardiology Ortho,  Procedures performed: None  Disposition: Skilled nursing facility Diet recommendation:  Cardiac diet DISCHARGE MEDICATION: Allergies as of 03/13/2024       Reactions   Celecoxib Other (See Comments)   Ineffective   Penicillin G Sodium Nausea Only   Naprosyn [naproxen] Rash        Medication List     TAKE these medications    acetaminophen  650 MG CR tablet Commonly known as: TYLENOL  Take 650 mg by mouth every  8 (eight) hours as needed for pain.   amiodarone  200 MG tablet Commonly known as: PACERONE  Take 0.5 tablets (100 mg total) by mouth daily. Take 200 mg twice daily for 10 days, until 10/12 then on 10/13 start taking 200 mg daily. What changed:  how much to take when to take this   atorvastatin  20 MG tablet Commonly known as: LIPITOR Take 2 tablets (40 mg total) by mouth daily. What changed: how much to take   buprenorphine 5 MCG/HR Ptwk Commonly known as: BUTRANS Place 1 patch onto the skin once a week. What changed: how to take this   clobetasol 0.05 % external solution Commonly known  as: TEMOVATE Apply 1 Application topically daily as needed (psoriasis).   donepezil  5 MG tablet Commonly known as: ARICEPT  Take 5 mg by mouth at bedtime.   DULoxetine  60 MG capsule Commonly known as: CYMBALTA  Take 1 capsule (60 mg total) by mouth 2 (two) times daily.   furosemide  40 MG tablet Commonly known as: LASIX  Take 40 mg by mouth daily as needed.   gabapentin  300 MG capsule Commonly known as: NEURONTIN  Take 1 capsule (300 mg total) by mouth 3 (three) times daily.   ketoconazole 2 % cream Commonly known as: NIZORAL Apply 1 Application topically daily as needed for irritation.   lactulose  10 GM/15ML solution Commonly known as: CHRONULAC  Take 30 mLs (20 g total) by mouth daily as needed for mild constipation.   levothyroxine  50 MCG tablet Commonly known as: SYNTHROID  Take 50 mcg by mouth daily before breakfast.   Melatonin 10 MG Tabs Take 10 mg by mouth at bedtime.   metoprolol  succinate 25 MG 24 hr tablet Commonly known as: TOPROL -XL Take 1 tablet (25 mg total) by mouth daily. What changed: how much to take   oxymetazoline  0.05 % nasal spray Commonly known as: AFRIN Place 1 spray into both nostrils at bedtime as needed for congestion (sinuses).   pantoprazole  20 MG tablet Commonly known as: PROTONIX  Take 20 mg by mouth daily.   PreserVision AREDS 2 Caps Take 2  capsules by mouth 2 (two) times daily.   Rivaroxaban  15 MG Tabs tablet Commonly known as: XARELTO  Take 15 mg by mouth every evening.   senna-docusate 8.6-50 MG tablet Commonly known as: Senokot-S Take 1 tablet by mouth at bedtime.   torsemide 20 MG tablet Commonly known as: DEMADEX Take 40 mg by mouth 2 (two) times daily.        Follow-up Information     Hanson Jerona GAILS, MD Follow up.   Specialty: Orthopedic Surgery Why: As needed Contact information: [address]  [name] [address] [phone]                Discharge Exam: Carlos Hanson   03/09/24 0509 03/12/24 0223 03/13/24 0457  Weight: 127.3 kg 116.7 kg 126 kg  General exam: Pleasant, elderly Caucasian male.  Not in pain Skin: No rashes, lesions or ulcers. HEENT: Atraumatic, normocephalic, no obvious bleeding Lungs: Clear to auscultation bilaterally,  CVS: A-fib with mild tachycardia, S1, S2, no murmur,   GI/Abd: Soft, nontender, nondistended, bowel sound present,   CNS: Alert, awake, slow to answer but oriented x 3 Psychiatry: Mood appropriate Extremities: No pedal edema, no calf tenderness, right fifth toe gangrenous   Condition at discharge: poor  The results of significant diagnostics from this hospitalization (including imaging, microbiology, ancillary and laboratory) are listed below for reference.   Imaging Studies: PERIPHERAL VASCULAR CATHETERIZATION Result Date: 03/08/2024 Images from the original result were not included.   Patient name: Carlos Hanson  MRN: [id number]        DOB: 31-Jan-1941            Sex: male  03/08/2024 Pre-operative Diagnosis: Right lower extremity critical and ischemic tissue loss of the fifth digit Post-operative diagnosis:  Same Surgeon:  Fonda FORBES Rim, MD Procedure Performed: 1.  Ultrasound-guided micropuncture access of the left common femoral artery retrograde fashion 2.  Aortogram 3.  Second order cannulation, right lower extremity angiogram 4.  Third order  cannulation, right lower extremity angiogram 5.  Moderate sedation time 42 minutes, contrast volume 20 cc 6.  Mynx closure  Indications: Patient is an 83 year old male who presented to the hospital with gangrenous right fifth toe.  He had a palpable pulse in the foot, however a toe pressure of 0.  After discussing risks and benefits of diagnostic angiography in an effort to define, and possibly improve perfusion for wound healing, Ryott elected to proceed  Findings: Sluggish flow throughout the arterial system indicative of heart failure No flow-limiting stenosis appreciated in the aortoiliac segments bilaterally No flow-limiting stenosis appreciated in the right lower extremity with three-vessel runoff to the foot.  Dorsalis pedis and plantar arteries patent. 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  Pedal arch did not appear to be intact.              Procedure:  The patient was identified in the holding area and taken to room 8.  The patient was then placed supine on the table and prepped and draped in the usual sterile fashion.  A time out was called.  Ultrasound was used to evaluate the left common femoral artery.  It was patent .  A digital ultrasound image was acquired.  A micropuncture needle was used to access the left common femoral artery under ultrasound guidance.  An 018 wire was advanced without resistance and a micropuncture sheath was placed.  The 018 wire was removed and a benson wire was placed.  The micropuncture sheath was exchanged for a 5 french sheath.  An omniflush catheter was advanced over the wire to the level of L-1.  An abdominal angiogram was obtained using CO2.  Next, using the omniflush catheter and a benson wire, the aortic bifurcation was crossed and the catheter was placed into theright external iliac artery and right runoff was obtained again using CO2.  Due to slow flow, the catheter was then advanced into the superficial femoral artery to aid in tibial imaging. This required a  small contrast.   Fonda FORBES Rim MD [facility] Office: [phone]    VAS US  ABI WITH/WO TBI Result Date: 03/07/2024  LOWER EXTREMITY DOPPLER STUDY Patient Name:  Carlos Hanson  Date of Exam:   03/07/2024 Medical Rec #: [id number]     Accession #:    [id number] Date of Birth: 1940/05/17      Patient Gender: M Patient Age:   23 years Exam Location:  [facility] Procedure:      VAS US  ABI WITH/WO TBI Referring Phys: LYNWOOD MOULDER --------------------------------------------------------------------------------  Indications: Gangrene. Dry extremity. High Risk         Hyperlipidemia, past history of smoking, coronary artery Factors:          disease.  Comparison Study: No prior exam. Performing Technologist: Edilia Elden Appl  Examination Guidelines: A complete evaluation includes at minimum, Doppler waveform signals and systolic blood pressure reading at the level of bilateral brachial, anterior tibial, and posterior tibial arteries, when vessel segments are accessible. Bilateral testing is considered an integral part of a complete examination. Photoelectric Plethysmograph (PPG) waveforms and toe systolic pressure readings are included as required and additional duplex testing as needed. Limited examinations for reoccurring indications may be performed as noted.  ABI Findings: +---------+------------------+-----+---------+--------+ Right    Rt Pressure (mmHg)IndexWaveform Comment  +---------+------------------+-----+---------+--------+ Brachial 121                    triphasic         +---------+------------------+-----+---------+--------+ PTA      136  1.10 triphasic         +---------+------------------+-----+---------+--------+ DP       133               1.07 triphasic         +---------+------------------+-----+---------+--------+ Great Toe0                 0.00 Absent             +---------+------------------+-----+---------+--------+ +---------+------------------+-----+---------+-------+ Left     Lt Pressure (mmHg)IndexWaveform Comment +---------+------------------+-----+---------+-------+ Brachial 124                    triphasic        +---------+------------------+-----+---------+-------+ PTA      151               1.22 biphasic         +---------+------------------+-----+---------+-------+ DP       140               1.13 triphasic        +---------+------------------+-----+---------+-------+ Great Toe39                0.31 Abnormal         +---------+------------------+-----+---------+-------+ +-------+-----------+-----------+------------+------------+ ABI/TBIToday's ABIToday's TBIPrevious ABIPrevious TBI +-------+-----------+-----------+------------+------------+ Right  1.10       0.00                                +-------+-----------+-----------+------------+------------+ Left   1.22       0.31                                +-------+-----------+-----------+------------+------------+  Summary: Right: Resting right ankle-brachial index is within normal range. The right toe-brachial index is abnormal.  Left: Resting left ankle-brachial index is within normal range. The left toe-brachial index is abnormal.  *See table(s) above for measurements and observations.  Electronically signed by Penne Colorado MD on 03/07/2024 at 3:36:20 PM.    Final    DG Foot 2 Views Right Result Date: 03/06/2024 CLINICAL DATA:  [id number] Necrotic toes (HCC) [id number] EXAM: RIGHT FOOT - 2 VIEW COMPARISON:  None Available. FINDINGS: No acute fracture or dislocation. There is no evidence of arthropathy or other focal bone abnormality. Extensive soft tissue swelling about the toes and dorsum of the foot. No subcutaneous gas appreciated. IMPRESSION: Extensive soft tissue swelling about the toes and dorsum of the foot. No radiographic findings of osteomyelitis, at this  time. Electronically Signed   By: Rogelia Myers M.D.   On: 03/06/2024 18:38    Microbiology: Results for orders placed or performed during the hospital encounter of 03/06/24  Blood Cultures x 2 sites     Status: None   Collection Time: 03/06/24  6:24 PM   Specimen: BLOOD  Result Value Ref Range Status   Specimen Description BLOOD LEFT ANTECUBITAL  Final   Special Requests   Final    BOTTLES DRAWN AEROBIC AND ANAEROBIC Blood Culture results may not be optimal due to an inadequate volume of blood received in culture bottles   Culture   Final    NO GROWTH 5 DAYS Performed at [facility], [address], [place], [name] [address]    Report Status 03/11/2024 FINAL  Final  Blood Cultures x 2 sites     Status: None   Collection Time: 03/06/24  6:27 PM  Specimen: BLOOD RIGHT ARM  Result Value Ref Range Status   Specimen Description BLOOD RIGHT ARM  Final   Special Requests   Final    BOTTLES DRAWN AEROBIC AND ANAEROBIC Blood Culture adequate volume   Culture   Final    NO GROWTH 5 DAYS Performed at [facility], [address], [place], [name] [address]    Report Status 03/11/2024 FINAL  Final  MRSA Next Gen by PCR, Nasal     Status: None   Collection Time: 03/07/24  5:49 AM   Specimen: Nasal Mucosa; Nasal Swab  Result Value Ref Range Status   MRSA by PCR Next Gen NOT DETECTED NOT DETECTED Final    Comment: (NOTE) The GeneXpert MRSA Assay (FDA approved for NASAL specimens only), is one component of a comprehensive MRSA colonization surveillance program. It is not intended to diagnose MRSA infection nor to guide or monitor treatment for MRSA infections. Test performance is not FDA approved in patients less than 31 years old. Performed at [facility], [address], [place], [name] [address]     Labs: CBC: Recent Labs  Lab 03/07/24 0009 03/08/24 0005 03/10/24 0308 03/11/24 0342 03/11/24 1132 03/12/24 0255 03/13/24 0329  WBC 7.2   < > 8.6 7.2  8.4 8.1 9.3  NEUTROABS 3.9  --   --   --   --   --   --   HGB 11.3*   < > 10.8* 10.6* 10.8* 10.8* 10.7*  HCT 37.0*   < > 35.1* 33.8* 34.3* 34.6* 34.1*  MCV 96.9   < > 96.2 94.9 94.8 95.1 94.2  PLT 377   < > 350 292 314 305 318   < > = values in this interval not displayed.   Basic Metabolic Panel: Recent Labs  Lab 03/06/24 2040 03/07/24 0405 03/08/24 0754 03/09/24 0639 03/11/24 1132  NA 141 140 139 141 140  K 3.8 3.7 3.8 4.3 4.0  CL 99 98 97* 102 102  CO2 31 28 26 23 27   GLUCOSE 100* 87 83 75 86  BUN 43* 42* 37* 33* 28*  CREATININE 2.65* 2.48* 2.16* 2.11* 1.81*  CALCIUM  8.7* 8.5* 8.5* 8.5* 8.8*  MG  --  2.1  --   --   --    Liver Function Tests: Recent Labs  Lab 03/06/24 2040 03/11/24 1132  AST 18 19  ALT 16 17  ALKPHOS 63 56  BILITOT 0.8 1.0  PROT 6.1* 6.1*  ALBUMIN 2.3* 2.2*   CBG: Recent Labs  Lab 03/08/24 1532  GLUCAP 84    Discharge time spent: greater than 30 minutes.  Signed: Landon FORBES Baller, MD [facility] 03/13/2024

## 2024-03-13 NOTE — Care Management Important Message (Signed)
 Important Message  Patient Details  Name: Carlos Hanson MRN: [id number] Date of Birth: 01/21/1941   Important Message Given:  Yes - Medicare IM     Vonzell Arrie Sharps 03/13/2024, 11:32 AM

## 2024-03-13 NOTE — TOC Transition Note (Signed)
 Transition of Care [facility]) - Discharge Note   Patient Details  Name: Carlos Hanson MRN: [id number] Date of Birth: 30-Oct-1940  Transition of Care [facility]) CM/SW Contact:  [name], LCSW Phone Number: 03/13/2024, 12:03 PM   Clinical Narrative:     Patient will Discharge [facility] Discharge Date: 03/13/2024 Family Notified: spouse  Transport By: ROME  Per MD patient is ready for discharge. RN, patient, and facility notified of discharge. Discharge Summary sent to facility. RN given number for report([phone]). Ambulance transport requested for patient.   Clinical Social Worker signing off.  Montie Louder, MSW, LCSW Clinical Social Worker     Final next level of care: Skilled Nursing Facility Barriers to Discharge: Barriers Resolved   Patient Goals and CMS Choice Patient states their goals for this hospitalization and ongoing recovery are:: Return to [facility]          Discharge Placement              Patient chooses bed at: [facility] Patient to be transferred to facility by: [facility] Name of family member notified: spouse Patient and family notified of of transfer: 03/13/24  Discharge Plan and Services Additional resources added to the After Visit Summary for                                       Social Drivers of Health (SDOH) Interventions SDOH Screenings   Food Insecurity: No Food Insecurity (03/07/2024)  Housing: Low Risk  (03/07/2024)  Transportation Needs: No Transportation Needs (03/07/2024)  Utilities: Not At Risk (03/07/2024)  Social Connections: Moderately Isolated (03/07/2024)  Tobacco Use: Medium Risk (03/06/2024)     Readmission Risk Interventions     No data to display

## 2024-03-13 NOTE — Progress Notes (Signed)
 Physical Therapy Treatment Patient Details Name: Carlos Hanson MRN: [id number] DOB: [birth year] Today's Date: 03/13/2024   History of Present Illness Pt is a 83 y.o. male  who presented on 03/06/24 from [facility] with R 5th toe wound. PMH: CHF, OA, venous stasis insufficiency, CAD, history of bleeding PUD, HLD, OSA, Afib with RVR.    PT Comments  Pt received in supine, spouse present, pt agreeable to therapy session. Pt needing up to +2 maxA to perform repositioning in bed and attempts at long sitting in bed. Pt with good participation in supine LE exercises (with bed in chair posture) and then lunch arriving, pt requesting to eat lunch. Defer hoyer OOB to chair as planned due to pt with discharge order in and pending PTAR transport to SNF. Handouts brought to his room to reinforce IS use and LE exercises post-acute. Patient will benefit from continued inpatient follow up therapy, <3 hours/day.     If plan is discharge home, recommend the following: Two people to help with walking and/or transfers;A lot of help with bathing/dressing/bathroom;Assistance with cooking/housework;Assist for transportation;Other (comment) (mechanical lift for OOB)   Can travel by private vehicle     No  Equipment Recommendations  None recommended by PT (hospital bed/hoyer lift at SNF)    Recommendations for Other Services       Precautions / Restrictions Precautions Precautions: Fall;Back Precaution Booklet Issued: No Recall of Precautions/Restrictions: Impaired Precaution/Restrictions Comments: c/o chronic back pain -back precs for comfort? Restrictions Weight Bearing Restrictions Per Provider Order: No     Mobility  Bed Mobility Overal bed mobility: Needs Assistance Bed Mobility: Supine to Sit     Supine to sit: Max assist, +2 for physical assistance, Used rails, HOB elevated     General bed mobility comments: elevated HOB/supine to long sitting and pillow repositioned behind him, pt  pulling up on bil side rails. +2 maxA for posterior supine scooting toward HOB with bed in trend, pt able to assist minimally with pulling on overhead rails and cued to use BLE to push. Pt defers EOB/OOB after repositioning and BLE strengthening exercises in bed chair posture, c/o fatigue and wanting to eat lunch which had arrived to the room.    Transfers Overall transfer level: Needs assistance                 General transfer comment: pt defers today due to food arriving and wanting to eat, pt agreeable to bed in chair posture; also leaving soon so RN defer hoyer OOB    Ambulation/Gait               General Gait Details: unable   Stairs             Wheelchair Mobility     Tilt Bed    Modified Rankin (Stroke Patients Only)       Balance Overall balance assessment: Needs assistance Sitting-balance support: Single extremity supported, Feet supported Sitting balance-Leahy Scale: Poor Sitting balance - Comments: posterior lean in long sitting trial with bed in chair posture Postural control: Posterior lean     Standing balance comment: pt defers to attempt today                            Communication Communication Communication: Impaired Factors Affecting Communication: Hearing impaired (R ear better than L)  Cognition Arousal: Alert Behavior During Therapy: WFL for tasks assessed/performed   PT - Cognitive impairments: Memory, Sequencing, Problem  solving                       PT - Cognition Comments: Pt aware of previous hospitalization but some difficulty with sequence of events, pt aware of memory deficit. Spouse reports his delirium tends to increase the longer he stays at hospital. Following commands: Impaired Following commands impaired: Follows one step commands with increased time, Only follows one step commands consistently    Cueing Cueing Techniques: Verbal cues, Gestural cues, Tactile cues, Visual cues  Exercises  General Exercises - Lower Extremity Ankle Circles/Pumps: AROM, Both, 10 reps, Supine Long Arc Quad: AROM, Both, Other (comment), 10 reps (bed in chair posture, cues for 3 sec hold) Hip ABduction/ADduction: AROM, Both, 10 reps, Other (comment) (bed in chair posture; pillow squeezes) Hip Flexion/Marching: AROM, Both, Other (comment), 10 reps (bed in chair posture) Other Exercises Other Exercises: IS x 10 reps, pt achieves ~500-800 mL, encouraged x5-10 reps hourly    General Comments General comments (skin integrity, edema, etc.): BP 109/77 (89) with bed in chair posture and HOB ~65 deg; HR 98 bpm resting and up to 120 bpm with LE exercises in bed      Pertinent Vitals/Pain Pain Assessment Pain Location: back, RLE, generalized Pain Descriptors / Indicators: Discomfort, Grimacing, Guarding    Home Living                          Prior Function            PT Goals (current goals can now be found in the care plan section) Acute Rehab PT Goals Patient Stated Goal: get stronger and less back pain PT Goal Formulation: With patient Time For Goal Achievement: 03/21/24 Progress towards PT goals: Progressing toward goals    Frequency    Min 2X/week      PT Plan      Co-evaluation              AM-PAC PT 6 Clicks Mobility   Outcome Measure  Help needed turning from your back to your side while in a flat bed without using bedrails?: A Lot Help needed moving from lying on your back to sitting on the side of a flat bed without using bedrails?: A Lot Help needed moving to and from a bed to a chair (including a wheelchair)?: Total Help needed standing up from a chair using your arms (e.g., wheelchair or bedside chair)?: Total Help needed to walk in hospital room?: Total Help needed climbing 3-5 steps with a railing? : Total 6 Click Score: 8    End of Session   Activity Tolerance: Patient limited by fatigue Patient left: in bed;with call bell/phone within  reach;with bed alarm set;Other (comment);with family/visitor present (heels floated, bed in chair posture, spouse present) Nurse Communication: Mobility status;Need for lift equipment PT Visit Diagnosis: Muscle weakness (generalized) (M62.81);Difficulty in walking, not elsewhere classified (R26.2);Dizziness and giddiness (R42)     Time: 8850-8789 PT Time Calculation (min) (ACUTE ONLY): 21 min  Charges:    $Therapeutic Exercise: 8-22 mins PT General Charges $$ ACUTE PT VISIT: 1 Visit                     Latressa Harries P., PTA Acute Rehabilitation Services Secure Chat Preferred 9a-5:30pm Office: [phone]    Connell HERO [facility] 03/13/2024, 3:06 PM

## 2024-03-13 NOTE — Plan of Care (Signed)

## 2024-03-13 NOTE — Progress Notes (Signed)
Placed patient on CPAP for the night via auto-mode.  

## 2024-03-18 DIAGNOSIS — R2681 Unsteadiness on feet: Secondary | ICD-10-CM | POA: Diagnosis not present

## 2024-03-28 DIAGNOSIS — E1151 Type 2 diabetes mellitus with diabetic peripheral angiopathy without gangrene: Secondary | ICD-10-CM | POA: Diagnosis not present

## 2024-03-28 DIAGNOSIS — I2510 Atherosclerotic heart disease of native coronary artery without angina pectoris: Secondary | ICD-10-CM | POA: Diagnosis not present

## 2024-03-28 DIAGNOSIS — L89302 Pressure ulcer of unspecified buttock, stage 2: Secondary | ICD-10-CM | POA: Diagnosis not present

## 2024-03-28 DIAGNOSIS — I10 Essential (primary) hypertension: Secondary | ICD-10-CM | POA: Diagnosis not present

## 2024-04-04 DIAGNOSIS — L89302 Pressure ulcer of unspecified buttock, stage 2: Secondary | ICD-10-CM | POA: Diagnosis not present

## 2024-04-05 ENCOUNTER — Ambulatory Visit: Admitting: Cardiology

## 2024-04-06 ENCOUNTER — Ambulatory Visit: Admitting: Cardiology

## 2024-04-06 DIAGNOSIS — I96 Gangrene, not elsewhere classified: Secondary | ICD-10-CM | POA: Diagnosis not present

## 2024-04-06 DIAGNOSIS — I2510 Atherosclerotic heart disease of native coronary artery without angina pectoris: Secondary | ICD-10-CM | POA: Diagnosis not present

## 2024-04-06 DIAGNOSIS — E1151 Type 2 diabetes mellitus with diabetic peripheral angiopathy without gangrene: Secondary | ICD-10-CM | POA: Diagnosis not present

## 2024-04-06 DIAGNOSIS — I10 Essential (primary) hypertension: Secondary | ICD-10-CM | POA: Diagnosis not present

## 2024-04-06 DIAGNOSIS — E785 Hyperlipidemia, unspecified: Secondary | ICD-10-CM | POA: Diagnosis not present

## 2024-04-06 DIAGNOSIS — R251 Tremor, unspecified: Secondary | ICD-10-CM | POA: Diagnosis not present

## 2024-04-06 DIAGNOSIS — I4891 Unspecified atrial fibrillation: Secondary | ICD-10-CM | POA: Diagnosis not present

## 2024-04-06 DIAGNOSIS — I5033 Acute on chronic diastolic (congestive) heart failure: Secondary | ICD-10-CM | POA: Diagnosis not present

## 2024-04-06 DIAGNOSIS — G4733 Obstructive sleep apnea (adult) (pediatric): Secondary | ICD-10-CM | POA: Diagnosis not present

## 2024-04-06 DIAGNOSIS — N189 Chronic kidney disease, unspecified: Secondary | ICD-10-CM | POA: Diagnosis not present

## 2024-04-06 DIAGNOSIS — I739 Peripheral vascular disease, unspecified: Secondary | ICD-10-CM | POA: Diagnosis not present

## 2024-05-13 NOTE — Progress Notes (Signed)
 [facility]patient Care Specialist Progress Note [facility] Medicine Progress Note  Date of Admission:  05/07/2024 Length of Stay:  5 Days Chief Complaint:Confusion   Subjective/Events Overnight: Pt seen and examined at the bedside. Spoke with the pt's wife. Pt's wife is requesting that the pt return back to his facility and never return back to the hospital.   She is aware that the pt's hospital visits are becoming more frequent and she advises that the pt would have not wanted to live like this.  Objective   Vital signs in last 24 hours: Intake/Output:  Temp:  [96.4 F (35.8 C)-98.8 F (37.1 C)] 98.8 F (37.1 C) Heart Rate:  [83-118] 97 Resp:  [19-22] 19 BP: (85-150)/(52-82) 126/82 SpO2:  [93 %-100 %] 97 % O2 Flow Rate (L/min): 3 L/min  Wt Readings from Last 1 Encounters:  05/11/24 131.3 kg (289 lb 6.4 oz)    Weight change:   Intake/Output Summary (Last 24 hours) at 05/13/2024 1118 Last data filed at 05/12/2024 1803 Gross per 24 hour  Intake 480 ml  Output --  Net 480 ml       Physical Exam   Constitutional - resting comfortably,   Eyes - pupils equal round and reactive to light and accomodation, extraocular movements intact  Nose - no gross deformity or drainage Mouth - no oral lesions noted Throat - no swelling or erythema   CV - (+)S1S2  Resp - CTA bilaterally  GI - (+)BS, soft, non-tender, non-distended MSK - ROM normal    Skin - no rashes or wounds Neuro - alert, aware  Psych - normal affect and mood     Recent Labs    Units 05/13/24 0402  WBC thou/mcL 7.2  HGB gm/dL 8.9*  HCT % 69.8*  PLT thou/mcL 302   Recent Labs    Units 05/13/24 0402  NA mmol/L 143  K mmol/L 4.7  CL mmol/L 106  CO2 mmol/L 27  BUN mg/dL 27  CREATININE mg/dL 8.50*  CALCIUM  mg/dL 9.3   Recent Labs    Units 05/13/24 0402 05/12/24 0256 05/11/24 0300  MAGNESIUM  mg/dL 2.3 2.3 2.3   Recent Labs    Units 05/08/24 0354   HGBA1C % 5.7*   Recent Labs    Units 05/13/24 0402  BILITOT mg/dL 0.3  AST U/L 16  ALT U/L 10  ALKPHOS U/L 98  ALBUMIN  gm/dL 2.5*   Recent Labs    Units 05/07/24 2131  INR  3.5   No results for input(s): CHOL, LDL, HDL, TRIG in the last 168 hours. Recent Labs    Units 05/13/24 0402 05/12/24 2110 05/12/24 1629 05/12/24 1159 05/12/24 0331 05/12/24 0256  GLUCOSE mg/dL 89 888* 884* 885* 887* 112*   No results for input(s): TROPONIN, CK in the last 168 hours.  Invalid input(s): CK-MB Recent Labs    Units 05/07/24 2131  BNP pg/mL 3,021*       fentaNYL      acetaminophen  **OR** acetaminophen , polyethylene glycol   Assessment/Plan:  Alexus Michael is a 84 y.o. White or Caucasian [1] male with:  Assessment: Principal Problem:   Multifocal pneumonia Active Problems:   AKI (acute kidney injury)   Acute metabolic encephalopathy   Essential hypertension   Stage 3b chronic kidney disease (*)   Dyslipidemia   PAF (paroxysmal atrial fibrillation) (*)   History of peripheral neuropathy   Adult hypothyroidism   Prolonged Q-T interval on ECG   Normocytic anemia, not  due to blood loss   Anxiety and depression   Nursing home resident   OSA on CPAP   Pyuria    Plan: 1. Acute metabolic encephalopathy multifocal PNA 2. Klebsiella pneumoniae UTI  3. AKI on CKD 3b 4. HTN 5. Dyslipidemia  6. PAF  7. Hypothyroidism  8. Peripheral neuropathy  9. OSA on CPAP  10.QTc prolongation  11. Spasticity 12. Need for comfort care - IV fentanyl  drip  - Case management working on disposition back to facility (with hospice services)              Dispo per CM  Discussed plan of care with nurse and case manager I have discussed the diagnoses and care plan with the patient and spouse.      Zackery Sira, MD 05/13/2024 11:18 AM

## 2024-05-31 ENCOUNTER — Telehealth: Payer: Self-pay | Admitting: Adult Health

## 2024-05-31 ENCOUNTER — Telehealth: Payer: Self-pay | Admitting: Cardiology

## 2024-05-31 NOTE — Telephone Encounter (Signed)
 Pt's wife called to inform the office that the pt has passed away on May 26, 2024. Appt has been cx for Cpap f/u.

## 2024-05-31 NOTE — Telephone Encounter (Signed)
 I spoke to his wife, I know the patient and his wife for a very long time, she was very appreciative of the care that Kazimir received from us  and was also appreciative of the fact that I called her.  I offered her my condolences and my prayers.

## 2024-05-31 NOTE — Telephone Encounter (Signed)
 Hey patient's wife wanted you to know that he had passed away 05-21-2024

## 2024-06-04 DEATH — deceased

## 2024-09-12 ENCOUNTER — Telehealth: Admitting: Adult Health
